# Patient Record
Sex: Female | Born: 1940 | Race: Black or African American | Hispanic: No | Smoking: Former smoker
Health system: Southern US, Community
[De-identification: ages and names within clinical notes are randomized; demographics above are authoritative.]

## PROBLEM LIST (undated history)

## (undated) DIAGNOSIS — I4891 Unspecified atrial fibrillation: Secondary | ICD-10-CM

## (undated) DIAGNOSIS — K5730 Diverticulosis of large intestine without perforation or abscess without bleeding: Secondary | ICD-10-CM

## (undated) DIAGNOSIS — E7800 Pure hypercholesterolemia, unspecified: Secondary | ICD-10-CM

## (undated) DIAGNOSIS — D1720 Benign lipomatous neoplasm of skin and subcutaneous tissue of unspecified limb: Secondary | ICD-10-CM

## (undated) DIAGNOSIS — M1990 Unspecified osteoarthritis, unspecified site: Secondary | ICD-10-CM

## (undated) DIAGNOSIS — I10 Essential (primary) hypertension: Secondary | ICD-10-CM

## (undated) DIAGNOSIS — M359 Systemic involvement of connective tissue, unspecified: Secondary | ICD-10-CM

## (undated) DIAGNOSIS — K648 Other hemorrhoids: Secondary | ICD-10-CM

## (undated) DIAGNOSIS — K219 Gastro-esophageal reflux disease without esophagitis: Secondary | ICD-10-CM

## (undated) HISTORY — DX: Unspecified atrial fibrillation: I48.91

## (undated) HISTORY — PX: VESICOVAGINAL FISTULA CLOSURE W/ TAH: SUR271

## (undated) HISTORY — DX: Benign lipomatous neoplasm of skin and subcutaneous tissue of unspecified limb: D17.20

## (undated) HISTORY — DX: Gastro-esophageal reflux disease without esophagitis: K21.9

## (undated) HISTORY — DX: Pure hypercholesterolemia, unspecified: E78.00

## (undated) HISTORY — PX: VAGINAL HYSTERECTOMY: SUR661

## (undated) HISTORY — PX: THYROIDECTOMY: SHX17

## (undated) HISTORY — DX: Unspecified osteoarthritis, unspecified site: M19.90

## (undated) HISTORY — PX: BREAST BIOPSY: SHX20

## (undated) HISTORY — DX: Other hemorrhoids: K64.8

## (undated) HISTORY — PX: ESOPHAGOGASTRODUODENOSCOPY: SHX1529

## (undated) HISTORY — PX: CYSTECTOMY: SUR359

## (undated) HISTORY — PX: ABDOMINAL HYSTERECTOMY: SHX81

## (undated) HISTORY — DX: Diverticulosis of large intestine without perforation or abscess without bleeding: K57.30

---

## 1999-04-01 ENCOUNTER — Ambulatory Visit (HOSPITAL_COMMUNITY): Admission: RE | Admit: 1999-04-01 | Discharge: 1999-04-01 | Payer: Self-pay | Admitting: Endocrinology

## 1999-04-01 ENCOUNTER — Encounter: Payer: Self-pay | Admitting: Endocrinology

## 1999-05-19 ENCOUNTER — Ambulatory Visit (HOSPITAL_COMMUNITY): Admission: RE | Admit: 1999-05-19 | Discharge: 1999-05-20 | Payer: Self-pay

## 1999-09-27 ENCOUNTER — Other Ambulatory Visit: Admission: RE | Admit: 1999-09-27 | Discharge: 1999-09-27 | Payer: Self-pay | Admitting: Obstetrics and Gynecology

## 2000-09-24 ENCOUNTER — Other Ambulatory Visit: Admission: RE | Admit: 2000-09-24 | Discharge: 2000-09-24 | Payer: Self-pay | Admitting: Obstetrics and Gynecology

## 2000-12-11 ENCOUNTER — Ambulatory Visit (HOSPITAL_COMMUNITY): Admission: RE | Admit: 2000-12-11 | Discharge: 2000-12-11 | Payer: Self-pay | Admitting: Gastroenterology

## 2001-08-02 ENCOUNTER — Inpatient Hospital Stay (HOSPITAL_COMMUNITY): Admission: AD | Admit: 2001-08-02 | Discharge: 2001-08-05 | Payer: Self-pay | Admitting: Family Medicine

## 2001-08-03 ENCOUNTER — Encounter: Payer: Self-pay | Admitting: Family Medicine

## 2001-08-05 ENCOUNTER — Encounter: Payer: Self-pay | Admitting: Nephrology

## 2001-09-02 ENCOUNTER — Encounter: Payer: Self-pay | Admitting: *Deleted

## 2001-09-02 ENCOUNTER — Emergency Department (HOSPITAL_COMMUNITY): Admission: EM | Admit: 2001-09-02 | Discharge: 2001-09-02 | Payer: Self-pay | Admitting: *Deleted

## 2001-12-16 ENCOUNTER — Encounter: Payer: Self-pay | Admitting: Obstetrics and Gynecology

## 2001-12-20 ENCOUNTER — Encounter (INDEPENDENT_AMBULATORY_CARE_PROVIDER_SITE_OTHER): Payer: Self-pay | Admitting: Specialist

## 2001-12-20 ENCOUNTER — Inpatient Hospital Stay (HOSPITAL_COMMUNITY): Admission: RE | Admit: 2001-12-20 | Discharge: 2001-12-22 | Payer: Self-pay | Admitting: Obstetrics and Gynecology

## 2002-07-14 ENCOUNTER — Ambulatory Visit (HOSPITAL_COMMUNITY): Admission: RE | Admit: 2002-07-14 | Discharge: 2002-07-14 | Payer: Self-pay | Admitting: Rheumatology

## 2002-07-14 ENCOUNTER — Encounter: Payer: Self-pay | Admitting: Rheumatology

## 2002-11-10 ENCOUNTER — Encounter (HOSPITAL_COMMUNITY): Admission: RE | Admit: 2002-11-10 | Discharge: 2002-12-10 | Payer: Self-pay | Admitting: Rheumatology

## 2002-11-10 ENCOUNTER — Encounter: Payer: Self-pay | Admitting: Rheumatology

## 2003-01-20 ENCOUNTER — Emergency Department (HOSPITAL_COMMUNITY): Admission: EM | Admit: 2003-01-20 | Discharge: 2003-01-21 | Payer: Self-pay | Admitting: Emergency Medicine

## 2003-07-29 ENCOUNTER — Encounter (HOSPITAL_COMMUNITY): Admission: RE | Admit: 2003-07-29 | Discharge: 2003-08-28 | Payer: Self-pay | Admitting: Orthopedic Surgery

## 2003-09-01 ENCOUNTER — Encounter (HOSPITAL_COMMUNITY): Admission: RE | Admit: 2003-09-01 | Discharge: 2003-09-11 | Payer: Self-pay | Admitting: Orthopedic Surgery

## 2003-09-15 ENCOUNTER — Encounter (HOSPITAL_COMMUNITY): Admission: RE | Admit: 2003-09-15 | Discharge: 2003-10-15 | Payer: Self-pay | Admitting: Orthopedic Surgery

## 2004-10-13 ENCOUNTER — Ambulatory Visit (HOSPITAL_COMMUNITY): Admission: RE | Admit: 2004-10-13 | Discharge: 2004-10-13 | Payer: Self-pay | Admitting: Family Medicine

## 2005-03-09 ENCOUNTER — Ambulatory Visit: Payer: Self-pay | Admitting: Orthopedic Surgery

## 2005-03-13 ENCOUNTER — Ambulatory Visit (HOSPITAL_COMMUNITY): Admission: RE | Admit: 2005-03-13 | Discharge: 2005-03-13 | Payer: Self-pay | Admitting: Orthopedic Surgery

## 2005-03-20 ENCOUNTER — Ambulatory Visit: Payer: Self-pay | Admitting: Orthopedic Surgery

## 2005-09-11 HISTORY — PX: KNEE ARTHROPLASTY: SHX992

## 2005-11-09 ENCOUNTER — Inpatient Hospital Stay (HOSPITAL_COMMUNITY): Admission: RE | Admit: 2005-11-09 | Discharge: 2005-11-14 | Payer: Self-pay | Admitting: Orthopedic Surgery

## 2005-12-04 ENCOUNTER — Encounter (HOSPITAL_COMMUNITY): Admission: RE | Admit: 2005-12-04 | Discharge: 2006-01-03 | Payer: Self-pay | Admitting: Orthopedic Surgery

## 2006-01-05 ENCOUNTER — Encounter (HOSPITAL_COMMUNITY): Admission: RE | Admit: 2006-01-05 | Discharge: 2006-02-04 | Payer: Self-pay | Admitting: Orthopedic Surgery

## 2006-10-04 ENCOUNTER — Inpatient Hospital Stay (HOSPITAL_COMMUNITY): Admission: RE | Admit: 2006-10-04 | Discharge: 2006-10-08 | Payer: Self-pay | Admitting: Orthopedic Surgery

## 2006-10-29 ENCOUNTER — Encounter (HOSPITAL_COMMUNITY): Admission: RE | Admit: 2006-10-29 | Discharge: 2006-11-28 | Payer: Self-pay | Admitting: Orthopedic Surgery

## 2006-11-29 ENCOUNTER — Encounter (HOSPITAL_COMMUNITY): Admission: RE | Admit: 2006-11-29 | Discharge: 2006-12-29 | Payer: Self-pay | Admitting: Orthopedic Surgery

## 2007-09-12 HISTORY — PX: COLONOSCOPY: SHX174

## 2007-09-12 HISTORY — PX: KNEE ARTHROPLASTY: SHX992

## 2007-10-04 ENCOUNTER — Ambulatory Visit: Payer: Self-pay | Admitting: Gastroenterology

## 2007-11-05 ENCOUNTER — Ambulatory Visit: Payer: Self-pay | Admitting: Gastroenterology

## 2007-11-18 ENCOUNTER — Ambulatory Visit (HOSPITAL_COMMUNITY): Admission: AD | Admit: 2007-11-18 | Discharge: 2007-11-18 | Payer: Self-pay | Admitting: Gastroenterology

## 2007-11-18 ENCOUNTER — Ambulatory Visit: Payer: Self-pay | Admitting: Gastroenterology

## 2008-01-12 ENCOUNTER — Emergency Department (HOSPITAL_COMMUNITY): Admission: EM | Admit: 2008-01-12 | Discharge: 2008-01-12 | Payer: Self-pay | Admitting: Emergency Medicine

## 2008-05-06 ENCOUNTER — Ambulatory Visit: Payer: Self-pay | Admitting: Gastroenterology

## 2009-11-11 ENCOUNTER — Ambulatory Visit (HOSPITAL_COMMUNITY): Admission: RE | Admit: 2009-11-11 | Discharge: 2009-11-11 | Payer: Self-pay | Admitting: Ophthalmology

## 2009-12-09 ENCOUNTER — Ambulatory Visit (HOSPITAL_COMMUNITY): Admission: RE | Admit: 2009-12-09 | Discharge: 2009-12-09 | Payer: Self-pay | Admitting: Ophthalmology

## 2010-01-30 ENCOUNTER — Emergency Department (HOSPITAL_COMMUNITY): Admission: EM | Admit: 2010-01-30 | Discharge: 2010-01-30 | Payer: Self-pay | Admitting: Emergency Medicine

## 2010-05-12 HISTORY — PX: OTHER SURGICAL HISTORY: SHX169

## 2010-05-20 ENCOUNTER — Encounter: Admission: RE | Admit: 2010-05-20 | Discharge: 2010-05-20 | Payer: Self-pay | Admitting: General Surgery

## 2010-05-23 ENCOUNTER — Ambulatory Visit
Admission: RE | Admit: 2010-05-23 | Discharge: 2010-05-23 | Payer: Self-pay | Source: Home / Self Care | Admitting: General Surgery

## 2010-08-25 ENCOUNTER — Ambulatory Visit: Payer: Self-pay | Admitting: Gastroenterology

## 2010-08-25 DIAGNOSIS — K648 Other hemorrhoids: Secondary | ICD-10-CM

## 2010-08-25 DIAGNOSIS — K5730 Diverticulosis of large intestine without perforation or abscess without bleeding: Secondary | ICD-10-CM | POA: Insufficient documentation

## 2010-08-25 DIAGNOSIS — K219 Gastro-esophageal reflux disease without esophagitis: Secondary | ICD-10-CM | POA: Insufficient documentation

## 2010-08-25 DIAGNOSIS — E785 Hyperlipidemia, unspecified: Secondary | ICD-10-CM | POA: Insufficient documentation

## 2010-08-25 DIAGNOSIS — I10 Essential (primary) hypertension: Secondary | ICD-10-CM | POA: Insufficient documentation

## 2010-08-25 HISTORY — DX: Diverticulosis of large intestine without perforation or abscess without bleeding: K57.30

## 2010-08-25 HISTORY — DX: Other hemorrhoids: K64.8

## 2010-08-30 ENCOUNTER — Ambulatory Visit: Payer: Self-pay | Admitting: Internal Medicine

## 2010-08-30 DIAGNOSIS — K30 Functional dyspepsia: Secondary | ICD-10-CM | POA: Insufficient documentation

## 2010-08-30 DIAGNOSIS — R1319 Other dysphagia: Secondary | ICD-10-CM | POA: Insufficient documentation

## 2010-09-06 ENCOUNTER — Ambulatory Visit (HOSPITAL_COMMUNITY)
Admission: RE | Admit: 2010-09-06 | Discharge: 2010-09-06 | Payer: Self-pay | Source: Home / Self Care | Attending: Internal Medicine | Admitting: Internal Medicine

## 2010-10-03 NOTE — Op Note (Addendum)
  Melissa Barajas, Melissa Barajas                  ACCOUNT NO.:  [id number]  MEDICAL RECORD NO.:  [id number]          PATIENT TYPE:  AMB  LOCATION:  DAY                           FACILITY:  [facility]  PHYSICIAN:  R. Roetta Sessions, M.D. DATE OF BIRTH:  [birth year]  DATE OF PROCEDURE:  09/06/2010 DATE OF DISCHARGE:                              OPERATIVE REPORT   PROCEDURE:  EGD with [name] dilation.  INDICATIONS FOR PROCEDURE:  A 69-year lady with longstanding reflux symptoms worse more recently on Prevacid 30 mg orally daily, was started on Dexilant 60 mg daily through our office for recent significant preliminary symptoms.  However, she still has some degree of esophageal dysphagia.  EGD with esophageal dilation, etc now being done.  Risks, benefits, limitations, imponderables, and alternatives have been reviewed, all parties questions answered and all parties agreeable. Please see the documentation of medical record.  PROCEDURE NOTE:  O2 saturation, blood pressure, pulse, respirations monitored throughout the entirety of procedure.  CONSCIOUS SEDATION:  Versed 4 mg IV, Demerol 75 mg IV in divided doses.  INSTRUMENT:  Pentax video chip system.  FINDINGS:  Examination of tubular esophagus revealed a noncritical- appearing Schatzki's ring otherwise esophageal mucosa initially appeared entirely normal.  EG junction easily traversed.  Stomach:  Gastric cavity was emptied and insufflated well with air. Thorough examination of gastric mucosa including retroflexion of proximal stomach, esophagogastric junction demonstrated only a small hiatal hernia.  Pylorus was patent, easily traversed.  Examination of the bulb and second portion revealed no abnormalities.  THERAPEUTIC/DIAGNOSTIC MANEUVERS PERFORMED:  Scope was withdrawn.  A 56- French Maloney dilator was passed to full insertion with ease.  A look back revealed ring had been ruptured nicely without apparent complication.  Also questionable  cervical esophageal web also being ruptured with this maneuver.  The patient tolerated the procedure well, was reactive to endoscopy.  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 Otherwise, esophageal mucosa appeared normal, small hiatal hernia, otherwise normal stomach D1 and D2.  RECOMMENDATIONS: 1. Antireflux literature provided to [name]. 2. Continue Dexilant 60 mg orally daily. 3. Follow up appointment with [place] in 1 year.     Jonathon Bellows, M.D.     RMR/MEDQ  D:  09/06/2010  T:  09/06/2010  Job:  [id number]  cc:   Melissa Picket A. Gerda Diss, MD Fax: [phone]  Electronically Signed by Lorrin Goodell M.D. on 10/03/2010 09:11:39 AM

## 2010-10-13 NOTE — Letter (Signed)
Summary: EGD/ED ORDER  EGD/ED ORDER   Imported By: Ave Filter 08/30/2010 16:02:36  _____________________________________________________________________  External Attachment:    Type:   Image     Comment:   External Document

## 2010-10-13 NOTE — Assessment & Plan Note (Signed)
Summary: acid reflux,hoarseness in voice.ss   Visit Type:  Follow-up Visit Primary Care Verena Shawgo:  [name]   CC:  acid reflux and hoarseness.  History of Present Illness: [name] is a 70 year old female with long-standing hx of reflux as well as constipation. She presents today in f/u for reflux. c/o new onset of epigastric pain/indigestion for past few months. +raspiness of voice that is new. Has tried Prilosec in the past but has done well with Prevacid for the past few years; however, over past few months has noticed worsening of symptoms. Denies loss of appetite, rare nausea. Down 7 lbs from 2 yeasr ago but this is purposeful. She c/o new onset of intermittent dysphagia with peanuts, popcorn, and sometimes without warning normal foods. No smoking, no NSAIDs, goodys or bc. Denies alcohol.   Current Medications (verified): 1)  Estroven Maximum Strength  Tabs (Nutritional Supplements) .... Once Daily 2)  Enalapril Maleate 10 Mg Tabs (Enalapril Maleate) 3)  Prevacid 30 Mg Cpdr (Lansoprazole) .... Once Daily 4)  Cardizem Cd 300 Mg Xr24h-Cap (Diltiazem Hcl Coated Beads) .... Once Daily 5)  Pravachol 40 Mg Tabs (Pravastatin Sodium) .... Take 1 Tablet By Mouth Once A Day 6)  Caltrate 600 Mg Plus D .... Two Tablets Daily 7)  Fish Oil 1000 Mg .... Take 1 Tablet By Mouth Two Times A Day  Allergies (verified): No Known Drug Allergies  Past History:  Past Medical History: HTN hypercholesterolemia GERD  Past Surgical History: Hysterectomy (fibroid tumors) Thyroidectomy Left breast cystectomy Left breast "tumor" under nipple in September 2011  Family History: No FH of Colon Cancer: brother had colon polyps age 40 Mother: deceased (age 75, ?CVA?) Father:deceased, cirrhosis (ETOH)  Social History: Retired: [employer] 52 years 4 children Patient is a former smoker. smoked X 15 years, quit in 1990, about 1/2ppd.  Alcohol Use - no, hx of socially in past Smoking  Status:  quit  Review of Systems General:  Denies fever, chills, and anorexia. Eyes:  Denies blurring, irritation, and discharge. ENT:  Complains of hoarseness and difficulty swallowing; denies sore throat. CV:  Denies chest pains and syncope. Resp:  Denies dyspnea at rest and wheezing. GI:  Complains of difficulty swallowing, nausea, indigestion/heartburn, and abdominal pain; denies pain on swallowing, constipation, change in bowel habits, bloody BM's, and black BMs. GU:  Denies urinary burning, blood in urine, and urinary frequency. MS:  Denies joint pain / LOM, joint swelling, and joint stiffness. Derm:  Denies rash, itching, and dry skin. Neuro:  Denies weakness and syncope. Psych:  Denies depression and anxiety. Endo:  Denies cold intolerance and heat intolerance. Heme:  Denies bruising and bleeding.  Vital Signs:  Patient profile:   70 year old female Height:      66 inches Weight:      183 pounds BMI:     29.64 Temp:     98.0 degrees F oral Pulse rate:   60 / minute BP sitting:   142 / 70  (left arm) Cuff size:   large  Vitals Entered By: Cloria Spring LPN (August 30, 2010 9:43 AM)  Physical Exam  General:  Well developed, well nourished, no acute distress. Eyes:  sclera without icterus Mouth:  No deformity or lesions, dentition normal. Lungs:  Clear throughout to auscultation. Heart:  Regular rate and rhythm; no murmurs, rubs,  or bruits. Abdomen:  +BS, soft, mild epigastric tenderness, no masses, no HSM.  Msk:  Symmetrical with no gross deformities. Normal posture. Pulses:  Normal pulses noted. Extremities:  No clubbing, cyanosis, edema or deformities noted. Neurologic:  Alert and  oriented x4;  grossly normal neurologically. Skin:  Intact without significant lesions or rashes. Psych:  Alert and cooperative. Normal mood and affect.  Impression & Recommendations:  Problem # 1:  GERD (ICD-56.45)  70 year old with long-standing hx of GERD previously  controlled on once-daily Prevacid, now with new-onset of worsening reflux, epigastric pain X few months. +raspiness of voice, new. Rare nausea. new onset of intermittent dysphagia. No smoking, NSAIDs, Goody's, or BC powder. due to new onset dyspepsia, will evaluate via EGD. diff dx include gastritis, PUD, erosive esophagitis.   Switch from Prevacid to Dexilant 60 mg by mouth daily. #15 samples given EGD with possible ED with Dr. Jena Gauss: risks, benefits, alternatives discussed with pt in detail. verbal consent obtained.  Orders: Consultation Level III (09811)  Problem # 2:  DYSPHAGIA (BJY-782.95)  See #1.   Orders: Consultation Level III (62130) Prescriptions: DEXILANT 60 MG CPDR (DEXLANSOPRAZOLE) take 1 by mouth daily  #30 x 3   Entered and Authorized by:   Gerrit Halls NP   Signed by:   Gerrit Halls NP on 08/30/2010   Method used:   Faxed to ...       [facility] [address] [phone]* (retail)       [address], [place]  [address]       Ph: [phone]       Fax: [phone]   RxID:   [id number]

## 2010-11-11 ENCOUNTER — Encounter: Payer: Self-pay | Admitting: Urgent Care

## 2010-11-17 ENCOUNTER — Encounter: Payer: Self-pay | Admitting: Gastroenterology

## 2010-11-22 NOTE — Medication Information (Signed)
Summary: DEXILANT 60MG   DEXILANT 60MG    Imported By: Rexene Alberts 11/11/2010 08:54:14  _____________________________________________________________________  External Attachment:    Type:   Image     Comment:   External Document  Appended Document: DEXILANT 60MG    Appended Document: DEXILANT 60MG  dexilant 60mg  sent to [facility] w/ 11 RF

## 2010-11-22 NOTE — Medication Information (Signed)
Summary: DEXILANT 60MG   DEXILANT 60MG    Imported By: Rexene Alberts 11/17/2010 15:06:44  _____________________________________________________________________  External Attachment:    Type:   Image     Comment:   External Document  Appended Document: DEXILANT 60MG  duplicate

## 2010-11-23 ENCOUNTER — Encounter: Payer: Self-pay | Admitting: Gastroenterology

## 2010-11-24 LAB — BASIC METABOLIC PANEL
BUN: 14 mg/dL (ref 6–23)
Chloride: 105 mEq/L (ref 96–112)
Creatinine, Ser: 0.61 mg/dL (ref 0.4–1.2)
GFR calc Af Amer: >60 mL/min (ref >60)
Glucose, Bld: 105 mg/dL — ABNORMAL HIGH (ref 70–99)
Potassium: 4.2 mEq/L (ref 3.5–5.1)
Sodium: 139 mEq/L (ref 135–145)

## 2010-11-24 LAB — POCT HEMOGLOBIN-HEMACUE: Hemoglobin: 13.2 g/dL (ref 12.0–15.0)

## 2010-11-24 LAB — CBC
Hemoglobin: 12.7 g/dL (ref 12.0–15.0)
MCHC: 33.3 g/dL (ref 30.0–36.0)
WBC: 7.7 10*3/uL (ref 4.0–10.5)

## 2010-11-28 LAB — RAPID STREP SCREEN (MED CTR MEBANE ONLY): Streptococcus, Group A Screen (Direct): NEGATIVE

## 2010-11-29 NOTE — Medication Information (Addendum)
Summary: DEXILANT 60MG   DEXILANT 60MG    Imported By: Rexene Alberts 11/23/2010 09:44:04  _____________________________________________________________________  External Attachment:    Type:   Image     Comment:   External Document  Appended Document: DEXILANT 60MG  KJ sent rx on 3/5. Please find out what is wrong.  Appended Document: DEXILANT 60MG  pt is requesting a cheaper medication. It is written in the comment section of the paperwork that was faxed from [facility].   Appended Document: DEXILANT 60MG     Prescriptions: NEXIUM 40 MG CPDR (ESOMEPRAZOLE MAGNESIUM) one by mouth 30 mins before bf daily  #30 x 11   Entered and Authorized by:   Leanna Battles. Dixon Boos   Signed by:   Leanna Battles Isola Mehlman PA-C on 11/28/2010   Method used:   Electronically to        [facility]. [phone]* (retail)       [address] [place], [place]  [address]       Ph: [phone]       Fax: [phone]   RxID:   [id number]     Appended Document: DEXILANT 60MG  Hopefully this will work. The patients formulary is not listed in EMR for whatever reason.

## 2010-11-30 LAB — HEMOGLOBIN AND HEMATOCRIT, BLOOD: HCT: 36.9 % (ref 36.0–46.0)

## 2010-11-30 LAB — BASIC METABOLIC PANEL
Calcium: 9.5 mg/dL (ref 8.4–10.5)
Creatinine, Ser: 0.69 mg/dL (ref 0.4–1.2)
GFR calc Af Amer: >60 mL/min (ref >60)
Potassium: 4.4 mEq/L (ref 3.5–5.1)

## 2010-12-30 ENCOUNTER — Other Ambulatory Visit (HOSPITAL_COMMUNITY): Payer: Self-pay | Admitting: Family Medicine

## 2010-12-30 DIAGNOSIS — M84375A Stress fracture, left foot, initial encounter for fracture: Secondary | ICD-10-CM

## 2010-12-30 DIAGNOSIS — Z139 Encounter for screening, unspecified: Secondary | ICD-10-CM

## 2011-01-04 ENCOUNTER — Ambulatory Visit (HOSPITAL_COMMUNITY)
Admission: RE | Admit: 2011-01-04 | Discharge: 2011-01-04 | Disposition: A | Discharge status: Home / Self Care | Payer: Medicare Other | Source: Ambulatory Visit | Attending: Family Medicine | Admitting: Family Medicine

## 2011-01-04 ENCOUNTER — Encounter (HOSPITAL_COMMUNITY): Payer: Self-pay

## 2011-01-04 DIAGNOSIS — M84375A Stress fracture, left foot, initial encounter for fracture: Secondary | ICD-10-CM

## 2011-01-04 DIAGNOSIS — T148XXA Other injury of unspecified body region, initial encounter: Secondary | ICD-10-CM | POA: Insufficient documentation

## 2011-01-04 DIAGNOSIS — X58XXXA Exposure to other specified factors, initial encounter: Secondary | ICD-10-CM | POA: Insufficient documentation

## 2011-01-04 DIAGNOSIS — Z139 Encounter for screening, unspecified: Secondary | ICD-10-CM

## 2011-01-04 HISTORY — DX: Essential (primary) hypertension: I10

## 2011-01-24 NOTE — Op Note (Signed)
NAMEARIANN, KHAIMOV                  ACCOUNT NO.:  [id number]   MEDICAL RECORD NO.:  [id number]          PATIENT TYPE:  AMB   LOCATION:  DAY                           FACILITY:  [facility]   PHYSICIAN:  Kassie Mends, M.D.      DATE OF BIRTH:  05-11-1941   DATE OF PROCEDURE:  11/18/2007  DATE OF DISCHARGE:                               OPERATIVE REPORT   INDICATION FOR EXAM:  Ms. Roorda is a 70 year old female who presents for  average risk colon cancer screening.   FINDINGS:  1. Rare sigmoid colon diverticula.  Otherwise no polyps, masses,      inflammatory changes or AVMs seen.  2. Small internal hemorrhoids.  Otherwise normal retroflexed view of      the rectum.   RECOMMENDATIONS:  1. Screening colonoscopy in 10 years.  2. She should follow a high fiber diet.  She has been given a handout      on high-fiber diet, diverticulosis and hemorrhoids.   MEDICATIONS:  1. Demerol 50 mg IV.  2. Versed 5 mg IV.   PROCEDURE TECHNIQUE:  Physical exam was performed.  Informed consent was  obtained from the patient after explaining the benefits, risks and  alternatives to the procedure.  The patient was connected to the monitor  and placed in left lateral position.  Continuous oxygen was provided by  nasal cannula and IV medicine administered through an indwelling  cannula.  After administration of sedation and rectal exam, the  patient's rectum was intubated and then the scope was advanced under  direct visualization to the cecum.  The scope was removed slowly by  carefully examining the color, texture, anatomy and integrity of the  mucosa on the way out.  The patient was recovered in endoscopy and  discharged home in satisfactory condition.      Kassie Mends, M.D.  Electronically Signed     SM/MEDQ  D:  11/18/2007  T:  11/19/2007  Job:  [id number]   cc:   Lorin Picket A. Gerda Diss, MD  Fax: [phone]

## 2011-01-24 NOTE — Assessment & Plan Note (Signed)
NAMEMarland Kitchen  ELLIONNA, BUCKBEE                   CHART#:  [id number]   DATE:  10/04/2007                       DOB:  1941/04/29   REFERRING PHYSICIAN:  [name].   REASON FOR CONSULTATION:  Heartburn and abdominal pain.   HISTORY OF PRESENT ILLNESS:  Ms. Fukushima is a 70 year old female who has  had problems with constipation all her life.  She also has  Gastroesophageal Reflux Disease.  She has not been taking her Prevacid  and her symptoms have been uncontrolled.  She occasionally has pain  around her naval.  She thinks she needs to be checked out.  She denies  any problems swallowing.  She thinks that she has a cough at night time  less than once a week due to her uncontrolled reflux disease.  She  denies any nausea, vomiting, weight loss, blood in her stool or black  tarry stools.  Her last colonoscopy was in the 90s with Dr. Randa Evens.  She did not have any polyps.  She quit using BC powder years ago and  denies aspirin or goodie powder use. She uses Aleve less than 1-2 times  a month.  She has never had any problems with sedation. She occasionally  drinks a soda.  She chews 2 sticks of gum a day.  She denies any  bloating.  She has a bowel movements once a week.   PAST MEDICAL HISTORY:  1. Hypertension.  2. Hyperlipidemia.  3. Gastroesophageal Reflux Disease.   PAST SURGICAL HISTORY:  1. Hysterectomy due to fibroid tumors.  2. Thyroidectomy.  3. Left breast cyst removed.   ALLERGIES:  No known drug allergies.   MEDICATIONS:  1. Prevacid 30 mg as needed.  2. Cardiac 300 mg daily.  3. Pravastatin 40 mg daily.  4. Enalapril 20 mg daily.  5. Estroven daily.  6. Estroven q.h.s. as needed.  7. Centrum Silver daily.  8. Caltrate D daily.  9. Gingko biloba daily.  10.Alter hair plus daily.  11.Relic 2 b.i.d. and 2 q.h.s.   FAMILY HISTORY:  She denies any family history of colon cancer, uterine,  breast or ovarian cancer.  Her brother had polyps at age 73.   SOCIAL HISTORY:  She  pays cash for her medicines and then is reimbursed  by her insurance company which is why she has not taken her Prevacid on  a regular basis.  She is married.  She is retired from [employer].  She does not smoke or drink.  She is looking into a membership at [employer].   REVIEW OF SYSTEMS:  Per HPI otherwise all systems are negative.   PHYSICAL EXAMINATION:  VITALS:  Weight 195 pounds, height 5 feet 6-1/2  inches, BMI 31.5 (obese).  Temp 97.8, blood pressure 140/78, pulse 60.  GENERAL:  She is in no apparent distress.  Alert and oriented x4.  HEENT: Atraumatic, normocephalic.  Pupils equal and reactive to light.  Mouth no oral lesions.  Posterior pharynx without erythema or exudate.  LUNGS:  Clear to auscultation bilaterally. CARDIOVASCULAR:  Regular rate  and rhythm with no murmur.  Normal S1/S2.  ABDOMEN:  Bowel sounds are  present, soft.  Nontender, nondistended.  No rebound or guarding.  No  hepatosplenomegaly, obese. EXTREMITIES:  Without cyanosis or edema.  NEURO:  She has no focal  neurologic deficits.   ASSESSMENT:  Ms. Mckinstry is a 70 year old female who has had a longstanding  history of constipation.  She also has gastroesophageal reflux disease  which will likely respond to appropriate therapy.  She has no alarm  symptoms.  Thank you for allowing me to see Ms. Diekmann in consultation.  My recommendations follows.   RECOMMENDATIONS:  1. Ms. Lerner was given her discharge instructions in writing.  For the      management of her constipation she is asked to drink 6-8 cups of      water or juice per day.  She is also asked to follow a high fiber      diet.  She is given a hand out on high fiber diet.  She should add      Benefiber once daily.  2. For her reflux management.  I have given her some samples of      Prilosec and asked her to take Prilosec 30 minutes before her first      meal.  She has also been given a prescription.  She has been given      coupons and she understands that  it is available over-the-counter.  3. She has a return patient visit in one month.  If her symptoms of      Gastroesophageal Reflux Disease are not controlled with Prilosec,      then will consider an endoscopy.  Will also schedule a colonoscopy      after her next visit.       Kassie Mends, M.D.  Electronically Signed    SM/MEDQ  D:  10/04/2007  T:  10/04/2007  Job:  [id number]   cc:   Lorin Picket A. Gerda Diss, MD

## 2011-01-24 NOTE — Assessment & Plan Note (Signed)
NAMEMarland Kitchen  Melissa Barajas, Melissa Barajas                   CHART#:  [id number]   DATE:  05/06/2008                       DOB:  May 20, 1941   REFERRING PHYSICIAN:  Scott A. Gerda Diss, MD   PROBLEM LIST:  1. Mild sigmoid colon diverticulosis.  2. Small internal hemorrhoids.  3. Gastroesophageal reflux disease.  4. Hypertension.  5. Hyperlipidemia.   SUBJECTIVE:  The patient is a 70 year old female who presents as a  return patient visit.  She was last seen in March 2009 for her  colonoscopy.  She reports her bowels are regular and she is not having  any heartburn.   MEDICATIONS:  1. Prilosec 20 mg daily.  2. Benefiber once a day.  3. Relacore.  4. Jeffie Pollock Max daily.  5. Enalapril.  6. Prevacid.  7. Cardizem CD 300 mg daily.   OBJECTIVE:   PHYSICAL EXAMINATION:  VITAL SIGNS:  Weight 190 pounds (down 1 pound  since February 2009), BMI 30.7 (obese), temperature 98.2, blood pressure  126/70, and pulse 60.  GENERAL:  She is in no apparent distress.LUNGS:  Clear to auscultation  bilaterally.CARDIOVASCULAR:  Regular rhythm.ABDOMEN:  Bowel sounds are  present, soft, nontender, and nondistended.   ASSESSMENT:  The patient is a 70 year old female whose gastroesophageal  reflux disease is well controlled.  She has not had any problems with  bowel regularity. Thank you for allowing me to see the patient in  consultation.  My recommendations are as follows.   RECOMMENDATIONS:  1. I encouraged the patient to lose approximately 5-10 pounds.  She is      unable to walk due to arthritis in her right foot.  I did encourage      her to use her stationary bike that is available at      home.  2. She may follow up with me in 6 months.       Kassie Mends, M.D.  Electronically Signed     SM/MEDQ  D:  05/06/2008  T:  05/07/2008  Job:  [id number]   cc:   Lorin Picket A. Gerda Diss, MD

## 2011-01-24 NOTE — Consult Note (Signed)
[name], Melissa Barajas                  ACCOUNT NO.:  [id number]   MEDICAL RECORD NO.:  [id number]           PATIENT TYPE:  AMB   LOCATION:  DAY                           FACILITY:  [facility]   PHYSICIAN:  Kassie Mends, M.D.      DATE OF BIRTH:  30-Aug-1941   DATE OF CONSULTATION:  11/05/2007  DATE OF DISCHARGE:                                 CONSULTATION   REFERRING PHYSICIAN:  Scott A. Gerda Diss, MD   PROBLEM LIST:  1. Hypertension.  2. Hyperlipidemia.  3. Gastroesophageal reflux disease.   SUBJECTIVE:  [name] is a 70 year old female who presents as a return  patient visit.  She restarted her Prevacid.  She is on Prilosec, and she  is no longer waking up coughing or choking.  Her bowel movements are  better with Benefiber.  She denies any problems swallowing.  She does  not smoke for the last 15 years.   MEDICATIONS:  1. Cardizem.  2. Enalapril.  3. Estrogen.  4. Centrum.  5. Caltrate D.  6. Ginkgo Biloba.  7. Ultra-Hair Plus.  8. Relacor.  9. Prilosec 20 mg daily.  10.Benefiber daily.   OBJECTIVE:  VITAL SIGNS:  Weight 191 pounds (down 4 pounds since January  2009), height 5 feet, 6-1/2 inches, temperature 97.9, blood pressure  118/72, pulse 16.  GENERAL:  She is in no apparent distress, alert and  oriented x4.  LUNGS:  Clear to auscultation bilaterally.  CARDIAC:  Regular rhythm, no murmur.  ABDOMEN:  Bowel sounds are  present, soft, nontender, nondistended.   ASSESSMENT:  Melissa Barajas is a 70 year old female who has gastroesophageal  reflux disease and her symptoms are now well controlled on therapy.  She  also needs colon cancer screening.  Thank you for allowing me to see [name] in consultation.  My recommendations follow.   RECOMMENDATIONS:  1. She is given the [facility] lifestyle management recommendations      for people with gastroesophageal reflux      disease.  2. She will be scheduled for colonoscopy with HalfLytely bowel prep.  3. She has a follow-up  appointment to see me in 6 months.  She has no      indication for upper endoscopy.      Kassie Mends, M.D.  Electronically Signed     SM/MEDQ  D:  11/05/2007  T:  11/06/2007  Job:  [id number]   cc:   Lorin Picket A. Gerda Diss, MD  Fax: [phone]

## 2011-01-27 NOTE — Discharge Summary (Signed)
NAMEHEIDEE, AUDI                  ACCOUNT NO.:  [id number]   MEDICAL RECORD NO.:  [id number]          PATIENT TYPE:  INP   LOCATION:  5002                         FACILITY:  [facility]   PHYSICIAN:  Vania Rea. Supple, M.D.  DATE OF BIRTH:  24-Mar-1941   DATE OF ADMISSION:  11/09/2005  DATE OF DISCHARGE:  11/14/2005                                 DISCHARGE SUMMARY   ADMISSION DIAGNOSES:  1.  End-stage osteoarthrosis of right-greater-than-left knee.  2.  Hypertension.  3.  History of rheumatoid arthritis.  4.  Reflux.   DISCHARGE DIAGNOSES:  1.  End-stage osteoarthrosis of right-greater-than-left knee.  2.  Hypertension.  3.  History of rheumatoid arthritis.  4.  Reflux.  5.  Status post right total knee arthroplasty.  6.  Postoperative hemorrhagic anemia requiring transfusion.   BRIEF HISTORY:  [name] is a 70 year old female who has been followed  by [place] as an outpatient for right knee known osteoarthrosis.  She has failed  outpatient conservative measures.  She has now had significant pain and  functional disabilities related to her right total knee arthroplasty.  At  this time, right total knee arthroplasty was indicated and she was in  agreement and wished to proceed.  Risks and benefits were discussed at  length and she wished proceed.   HOSPITAL COURSE:  The patient was admitted and underwent the above-named  procedure and tolerated this well.  All appropriate IV antibiotics and  analgesics were utilized.  Intraoperative Hemovac was placed and this was  discontinued postoperative day one.  She was allowed weightbearing as  tolerated.  She was placed on Coumadin for DVT and PE prophylaxis.  She was  weaned off IV pain meds and overall did well.  Unfortunately, she did  experience postoperative hemorrhagic anemia and became symptomatic.  On date  November 13, 2005, her hemoglobin was 8.3.  Heart rate was noted to be 125.  At  this time, she was transfused two units.  She felt  significantly improved  after that.  On date November 14, 2005, hemoglobin was 11.  Incision and clean  and dry.  T-max was 100 and she was ambulating well, has met all of her  therapy goals.  Her calves were soft and nontender.  At this time, she was  stable for discharge to home.  Home arrangements were made for home health  PT and OT.  She was discharged to home on this date.   LABORATORY DATA:  Admission hemoglobin 12.8, down to 8.1, and transfused to  11.1.  Pro times found in the chart, monitored by pharmacy for DVT and PE  prophylaxis.  Chemistries within normal limits except for mild hyponatremia  at 134 and 133.  Blood type shows 0 positive.  Two units transfused in the  chart.  EKG showed normal sinus rhythm with sinus arrhythmia.   CONDITION ON DISCHARGE:  Stable and improved.   DISCHARGE MEDS AND PLANS:  The patient is being discharged to home.  Home  health, PT and OT are arranged.  She will follow up in  our office in two  weeks' time.  Prescriptions were written for Coumadin, as well as Percocet  and Robaxin.  She also will take iron.  Resume her other home meds and home  diet.  Call for any questions or concerns.  May shower at this time.      Tracy A. Shuford, P.A.-C.      Vania Rea. Supple, M.D.  Electronically Signed    TAS/MEDQ  D:  12/20/2005  T:  12/21/2005  Job:  [id number]

## 2011-01-27 NOTE — Consult Note (Signed)
[facility]  Patient:    Melissa Barajas, Melissa Barajas Visit Number: [id number] MRN: [id number]          Service Type: MED Location: 2A A214 01 Attending Physician:  Harlow Asa Dictated by:   Maudry Diego, M.D. Admit Date:  08/02/2001   CC:         Mel Almond, M.D.   Consultation Report  ATTENDING PHYSICIAN:  Mel Almond, M.D.  REASON FOR CONSULTATION:  Uncontrolled hypertension and proteinuria.  HISTORY OF PRESENT ILLNESS:  Ms. Thome is a 70 year old African-American with a past medical history of hypertension and leg edema presently admitted because of uncontrolled hypertension, a severe headache, and dizziness.  According to Ms. Prisco, she was told to have hypertension about four years ago and was put on medication, but she did not take her medication until she came back recently where she was found to have very severe hypertension and also significant proteinuria, put on Norvasc and also started on ACE inhibitor without significant control.  Hence, patient is admitted.  Patient denies any previous history of diabetes but has recent history of proteinuria, but had had recurrent leg swelling for more than approximately five to seven years.  PAST MEDICAL HISTORY:  She has a history of hysterectomy because of vaginal bleeding, history of retinal surgery because of tumors.  She has a history of thyroid surgery about two years ago, not sure what the etiology was, and status post hemorrhoid surgery a long time ago, history of hypertension, and a history of leg edema.  MEDICATIONS: 1. Vasotec 20 mg p.o. q.d. 2. Norvasc 5 mg p.o. b.i.d. 3. Demerol p.r.n. 4. Rocephin 1 g IV q.24h. 5. Phenergan p.r.n.  ALLERGIES:  She is allergic to ELAVIL and NONSTEROIDAL.  SOCIAL HISTORY:  She is ex-smoker, stopped about five to six years ago.  No history of alcohol abuse.  FAMILY HISTORY:  No history of kidney disease in her family and no history  of diabetes.  REVIEW OF SYSTEMS:  Feels better now.  She has headache, occasional dizziness and nausea but no vomiting, or chest pain, urgency, or frequency, and no diarrhea.  She also had some fever when she came.  PHYSICAL EXAMINATION:  VITAL SIGNS:  Her blood pressure is 167/85, temperature 99.  Heart rate is 88, respiratory rate is 20.  GENERAL:  Alert in no apparent distress.  HEENT:  No conjunctival pallor, nonicteric.  Oral mucosa seems to be moist and pink.  NECK:  Supple.  No JVD.  CHEST:  Clear to auscultation.  No rales, no rhonchi.  HEART:  Regular rate and rhythm.  No murmur.  ABDOMEN:  Soft.  Positive bowel sounds.  EXTREMITIES:  No edema.  LABORATORY DATA:  Her white blood cell count is 10.1, hemoglobin 11.7, hematocrit 33.8, platelets 330 ______ 8.  Sodium 134, potassium 3.4, CO2 37, albumin 2.9, total protein 8.5, calcium 8.7.  ASSESSMENT:  1. History of proteinuria with edema, low albumin.  At this moment, I do not have protein in the urine, hence very difficult to assess. But, if she has significant proteinuria, more than 3.2, with hyponatremia and hypercholesterolemia, this seems to be consistent with nephrotic syndrome. Otherwise, patient might have proteinuria which is not in the nephrotic range. But, according to the history, patient seems to have significant proteinuria. The etiology at this moment is not clear.  But, in a patient who is in her 5s, we need to consider secondary causes such as lupus nephritis.  However,  idiopathic causes such as ______ nephrosclerosis and membranous nephropathy cannot be ruled out.  Since patient also has longstanding history of hypertension, hypertensive nephrosclerosis should be considered high on the list.  2. Hypertension, possibly idiopathic.  But, with the presence of hypokalemia, we may need to think in terms of hyperaldosteronism.  But, since patient has been on diuretics because of her edema, the  hypokalemia may be a a provoked hypokalemia; hence, the hypertension may be simply idiopathy with hypokalemia induced by the diuretics.  3. Hypokalemia.  As stated above, possibly secondary to diuretics.  4. Hypertension.  Patient presently started on Vasotec and Norvasc.  Blood pressure seems to be still somewhat high.  RECOMMENDATION:  Will discontinue Norvasc.  Will start her on Cardizem CD.  I agree with Vasotec.  We will supplement patient with potassium, with ultrasound of the kidneys to evaluate kidney size, and also, we will do a 24-hour urine for proteinuria.  Will check ANA complement, hepatitis B to rule out other cause of proteinuria and renal insufficiency. Dictated by:   Maudry Diego, M.D. Attending Physician:  Harlow Asa DD:  08/03/01 TD:  08/04/01 Job: [id number]

## 2011-01-27 NOTE — Op Note (Signed)
Melissa Barajas, Melissa Barajas                  ACCOUNT NO.:  [id number]   MEDICAL RECORD NO.:  [id number]          PATIENT TYPE:  INP   LOCATION:  2899                         FACILITY:  [facility]   PHYSICIAN:  Vania Rea. Supple, M.D.  DATE OF BIRTH:  06/29/1941   DATE OF PROCEDURE:  11/09/2005  DATE OF DISCHARGE:                                 OPERATIVE REPORT   PREOPERATIVE DIAGNOSIS:  End stage right knee osteoarthrosis.   POSTOPERATIVE DIAGNOSIS:  End stage right knee osteoarthrosis.   PROCEDURE:  Cemented right total knee arthroplasty utilizing a DePuy Sigma  size 3 femur, a size 3 tibia, a 10 mm thick rotating platform polyethylene  insert and 35 mm patellar button.   SURGEON:  Vania Rea. Supple, M.D.   Threasa HeadsFrench Ana A. Shuford, P.A.-C.   ANESTHESIA:  General endotracheal as well as a femoral nerve block.   ESTIMATED BLOOD LOSS:  150 mL.   TOURNIQUET TIME:  1 hour and 20 minutes.   DRAINS:  Hemovac x 1.   HISTORY:  Melissa Barajas is a 70 year old female who has had chronic right knee  pain with progressive valgus deformity and significant functional  limitations secondary to end stage arthrosis with particular involvement of  the lateral compartment.  Due to ongoing pain and functional limitations,  she is brought to the operating at this time for planned right total knee  arthroplasty.   Preoperatively, I counseled Melissa Barajas on treatment options as well as risks  versus benefits thereof.  Possible surgical complications of bleeding,  infection, neurovascular injury, DVT, PE as well as persistent pain were  reviewed.  She understands, accepts, and agrees with our planned procedure.   PROCEDURE IN DETAIL:  After undergoing routine preop evaluation, the patient  received prophylactic antibiotics.  The patient was brought to the operating  room and transferred to table and underwent smooth induction of a general  endotracheal anesthesia.  The anesthesia department then placed a femoral  nerve block.  A Foley catheter was then placed.  A tourniquet was applied to  the left thigh and the left leg was then sterilely prepped and draped in  standard fashion.  The leg was exsanguinated with the tourniquet inflated to  300 mmHg.  An anterior midline incision was then made beginning 4  fingerbreadths above the patella and extending just medial to the tibial  tubercle to a length of approximately 15 cm.  Skin flaps were  circumferentially mobilized and then sutured back.  Electrocautery was used  for hemostasis.  A medial parapatellar arthrotomy was then made with  electrocautery and the patella was everted and the fat pad was excised and a  very limited medial release was performed.  The suprapatellar pouch had  abundant proliferative synovial tissue and extensive synovectomy was  performed.  The patella was everted, the knee flexed up, the remnants of the  cruciate ligaments were removed as were the menisci.  A drill was then used  to gain access to the intramedullary canal and an intramedullary guide was  passed and an 11 mm resection from the  distal femur was made at a 5 degrees  valgus cut.  The distal femur was then sized and appropriate coverage with a  size 3 was determined.  The size 3 three cutting block was applied and the  anterior, posterior, and chamfer cuts were then made with a saw.  Trial  femur was applied showing good fit.  Attention turned to the proximal tibia  which was exposed with the McCale retractors.  An extramedullary guide was  then utilized and initially removed 10 mm of bone from the lateral plateau.  Sizing using the sizing block showed continued tightness and so we elected  to resect two additional mm.  At this point, the sizing block showed a good  fit although there was still some mild residual tightness.  With this in  mind we proceeded with a lateral and posterior release off of the femur in  the posterolateral corner of the tibia and off the  posterior femoral  condyles.  Once this extensive release was completed as well as removal of  all residual menisci posteriorly, the knee showed symmetric soft tissue  balancing in flexion and extension.  The proximal tibia was then drilled and  broached for the tibial keel.  Attention was then returned to the distal  femur where the box cutting guide was applied and a box cut was then made  the oscillating saw.  We then completed the removal of the remnants of the  cruciate ligaments and released the posterior capsule.  Our attention was  then directed towards the patella where synovectomy was performed around the  circumference of the patella and osteophytes were removed rongeur.  An  oscillating saw was then used to make a transverse cut removing the  articular surface and excising approximately 8 mm of bone.  The stabilizing  drill holes were then made with a drill for the 35 mm button.  At this  point, all cut surfaces of the joint was then copiously irrigated with  pulsatile lavage.  The cement was mixed at the back table and at the  appropriate consistency, the implants were cemented into position beginning  with the tibia, then the femur, and then the patella.  Meticulous removal of  all excess cement was then completed.  The knee was taken through a range of  motion showing excellent stability and good soft tissue balance.  The tibial  tray was removed and meticulous removal of all residual debris in the  posterior aspect of the knee was completed.  The final rotating platform  polyethylene tray was then inserted, the knee was reduced and again showed  excellent soft tissue balance, good clinical alignment, and full extension.  The tourniquet was then let down.  A Hemovac drain brought out  superolaterally.  Hemostasis was obtained.  The wound was closed in layers  with a series of interrupted figure-of-eight #1 Vicryl sutures for the parapatellar arthrotomy, 2-0 Vicryl for the  subcu, and intracuticular 3-0  Monocryl for the skin followed by Steri-Strips.  A bulky dry dressing was  wrapped about the knee and leg was wrapped with an Ace bandage with a high  support stocking.  Immobilizer placed.  The patient was then extubated and  taken to recovery room in stable condition.      Vania Rea. Supple, M.D.  Electronically Signed     KMS/MEDQ  D:  11/09/2005  T:  11/09/2005  Job:  [id number]

## 2011-01-27 NOTE — Op Note (Signed)
NAMEHASSIE, MANDT                  ACCOUNT NO.:  [id number]   MEDICAL RECORD NO.:  [id number]          PATIENT TYPE:  INP   LOCATION:  2899                         FACILITY:  [facility]   PHYSICIAN:  Vania Rea. Supple, M.D.  DATE OF BIRTH:  03-Nov-1940   DATE OF PROCEDURE:  10/04/2006  DATE OF DISCHARGE:                               OPERATIVE REPORT   PREOP DIAGNOSIS:  End-stage left knee osteoarthrosis.   POSTOPERATIVE DIAGNOSIS:  End-stage left knee osteoarthrosis.   PROCEDURE:  Cemented left total knee arthroplasty utilizing a DePuy  Sigma System; size 3 femur, size 3 tibia, 10 mm thick rotating platform  polyethylene insert and a 3.5 mm patella button.   SURGEON:  Francena Hanly, M.D.   ASSISTANT:  Ralene Bathe, PA-C   ANESTHESIA:  LMA general as well as a femoral nerve block.   TOURNIQUET TIME:  One hour and 26 minutes.   ESTIMATED BLOOD LOSS:  150 mL.   DRAINS:  Hemovac x1.   BRIEF HISTORY:  Mrs. Vanderloop is a 70 year old female who has had chronic  bilateral knee pain with known end-stage arthrosis and has recently  undergone a right total knee arthroplasty.  She has done very well  clinically and is now brought to the operating for planned left total  knee arthroplasty.   Preoperatively we counseled Mrs. Rials on treatment options as well as  the risks versus benefits thereof with possible surgical complications  of bleeding, infection, neurovascular injury, DVT, PE, persistent pain,  loss of motion, anesthetic complication, and possible need for  additional surgery were all reviewed.  She understands and accepts and  agrees with our planned procedure.   PROCEDURE IN DETAIL:  After undergoing routine preop evaluation, the  patient received prophylactic antibiotics.  Brought to the operating  room and placed supine on the operating table and underwent the smooth  induction of an LMA general anesthesia.  She also had a femoral nerve  block established at this time.  The  tourniquet was then applied to the  left thigh and the left leg was sterilely prepped and draped in standard  fashion.  The leg was exsanguinated with the tourniquet inflated to 350  mmHg.  An anterior midline incision was then made approximately 15 cm in  length and centered over the patella.  Skin flaps were mobilized and  reflected and hemostasis was obtained with electrocautery.   A medial parapatellar arthrotomy was then performed and the patella  everted and approximately 75% of the infrapatellar fat pad was excised.  Remnants of the cruciate ligaments and menisci were removed as well.  A  drill was then used to gain access to the intramedullary canal and left  femur and then an intramedullary guide was passed.  And 11 mm resection  was then performed off the distal femur with 5 degree valgus slope.  The  distal femur was then measured and a size 3 was determined.  The size 3  cutting block was then applied and the anterior, posterior and chamfer  cuts were then made off the distal femur.  The  distal femoral trial was  placed and this showed good bony cuts and excellent fit of the implant.   Attention was then turned to the proximal tibia.  An extramedullary  guide was then used.  We made initially a 10-mm resection measured from  the lateral plateau.  However, once we made this cut and sized the  flexion and extension gaps this was too tight and so we ended up making  two additional resections so that a total of 14 mm was resected from the  proximal tibia.  Once this was completed, we had excellent fit of both  the flexion and extension blocks.  The proximal tibia was then measured  and the size 3 tibia had the best coverage.  The tibia was then pinned  into position.  Trial reduction was performed.  We confirmed good soft  tissue balance with stability in full flexion and full extension.   At this point the proximal tibia was then terminally prepared using the  reamer and then a  broach for the keel.  We then returned our attention  to the distal femur where an oscillating saw was used for the box cut  using the box cutting guide.  The final trial femur box femur was  applied and this showed excellent fit.  Our attention was then turned to  the patella.  An oscillating saw was then used to resect approximately  8.5 mm of bone from the patella and then the stabilizing drill holes  were drilled.  At this point we then confirmed proper resection of the  posterior femoral condyles and cleaned residual meniscal and bony debris  from the posterior aspect of the joint.  All of remnants of the PCL were  removed and a slight posterior capsular release was then performed off  the distal femur.  This allowed excellent soft tissue balance.   At this point the cement was then mixed at the back table and at this  time we were pulsatile lavaging the joint and meticulously cleaning the  joint.  Implants were then cemented in position beginning with the tibia  and then the femur and then the patella.  Meticulous removal of all  excess cement was performed.  Once the cement had hardened we took the  knee through a range of motion and again showed excellent soft tissue  balance and full extension.  A final tibial implant was then placed 10  mm thick rotating platform polyethylene insert.  Once this was placed we  again took the knee through range of motion.  There was normal patellar  tracking.  Good stability.  A Hemovac drain was then brought out  superolaterally.  The tourniquet was let down.  Hemostasis was obtained.  The parapatellar arthrotomy was closed with a series of figure-of-eight  #1 Vicryl sutures.  2-0 Vicryl was used for the closure of the deep and  superficial subcu layers and intracuticular Monocryl used to close the  skin followed by Steri-Strips.  A bulky dry dressing was then placed over the left knee.  A knee immobilizer was applied, ice-packs.  The  patient  was then extubated and taken to the recovery room in stable  condition.      Vania Rea. Supple, M.D.  Electronically Signed     KMS/MEDQ  D:  10/04/2006  T:  10/04/2006  Job:  [id number]

## 2011-01-27 NOTE — Op Note (Signed)
[facility]  Patient:    Melissa Barajas, Melissa Barajas Visit Number: [id number] MRN: [id number]          Service Type: GYN Location: 1S X001 01 Attending Physician:  Brynda Peon Dictated by:   Edwena Felty. [name], M.D. Proc. 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  PROCEDURE: 1. Anterior and posterior repair by Dr. Altamese Dilling. 2. Pubovaginal sling done by Dr. Marcelyn Bruins.  ANESTHESIA:  General endotracheal.  ESTIMATED BLOOD LOSS:  100 cc.  COMPLICATIONS:  None.  DESCRIPTION OF PROCEDURE:  The patient was taken to the operating room and after the induction of adequate general endotracheal anesthesia, was placed in the dorsal lithotomy position and prepped and draped in the usual fashion.  A ______ two way retractor was place.  The vaginal cuff was grasped with Alliss at the base of the cystocele, opened with a knife.  The vaginal mucosa was undermined and opened with Metzenbaum scissors.  The edges of the vaginal mucosa were grasped with Alliss, and the underlying fascia was dissected free off the vaginal mucosa with sharp dissection.  This was done back to the apex of the vaginal cuff.  The fascia was then embrocated with interrupted sutures of 2-0 Vicryl to reduce the cystocele.  Dr. Logan Bores then placed the pubovaginal sling which will be dictated separately.  The vaginal mucosa was then trimmed and reapproximated with a running suture of 2-0 Vicryl.  Attention was next turned posteriorly.  Alliss were placed at the introidus posteriorly.  The vaginal mucosa was opened with the knife and undermined up to the apex of the vagina posteriorly using the Metzenbaum scissors.  The edges of the vaginal mucosa were grasped with Alliss, and the  underlying fascia was separated off the vaginal mucosa with sharp dissection.  The fascia was then embrocated with 2-0 Vicryl; the vaginal mucosa was trimmed, and the vagina was closed in a running fashion with 2-0 Vicryl.  A one-inch vaginal pack soaked in Estrace cream was inserted into the vagina, and the procedure was terminated.  The patient tolerated it well and went in satisfactory condition to postanesthesia recovery. Dictated by:   Edwena Felty. [name], M.D. Attending Physician:  Brynda Peon DD:  12/20/01 TD:  12/21/01 Job: [id number]

## 2011-01-27 NOTE — Discharge Summary (Signed)
[facility]  Patient:    Melissa Barajas, Melissa Barajas Visit Number: [id number] MRN: [id number]          Service Type: EMS Location: ED Attending Physician:  Ilean Skill Dictated by:   Donna Bernard, M.D. Admit Date:  09/02/2001 Discharge Date: 09/02/2001                             Discharge Summary  FINAL DIAGNOSES: 1. Accelerated hypertension. 2. Proteinuria. 3. Headache secondary to #1.  FINAL DISPOSITION:  The patient was discharged home.  DISCHARGE MEDICATIONS: 1. Vasotec 20 mg 1  per day. 2. Cardizem CD 240 mg 1 per day. 3. Maintain lipitor.  DISCHARGE INSTRUCTIONS: 1. Finish all antibiotics. 2. Follow up with Dr. [name] next week. 3. Follow up with Dr. Kristian Covey as scheduled in December.  INITIAL HISTORY AND PHYSICAL:  Please see history and physical as dictated.  HOSPITAL COURSE:  This patient is a 70 year old black female with a history of a history of hyperlipidemia who presented to the office for the third time in 3 days with the complaints of severe headache.  She is known to have extremely high blood pressure with a blood pressure 210/118.  The patient appeared to be in a moderate level of distress.  A urinalysis during the weekend revealed proteinuria.  We had already set the patient up with a kidney specialist.  Due to her progression of symptoms she was admitted to the hospital.  CT scan was done and this showed no acute hemorrhage.  A 24-hour urine collection was performed.  This revealed a significant amount of albumin loss but not enough to designate as true nephrotic syndrome.  The proteinuria was felt to be due to her renal disease.  Over the next several days the patient clinically improved.  Of note, she was given Rocephin IV for the possibility of sinusitis as part of her symptoms. Her urinalysis continued to show significant amount of protein.  Her renal function appeared to be normal with a creatinine of 0.7.  The  patient had an ultrasound which revealed no evidence of hydronephrosis and a normal sized urinary tract including the kidneys.  Her EKG was monitored due to the history of prior atrial fibrillation.  This remained normal.  Over the next several days the patient improved.  Her headache dissipated.  Her blood pressure improved markedly on her medications.  On the day of discharge the patient was discharged home with the diagnoses and disposition as noted above. Dictated by:   Donna Bernard, M.D. Attending Physician:  Ilean Skill DD:  09/05/01 TD:  09/06/01 Job: [id number]

## 2011-01-27 NOTE — Op Note (Signed)
[facility]  Patient:    Melissa Barajas, Melissa Barajas Visit Number: [id number] MRN: [id number]          Service Type: GYN Location: 1S X001 01 Attending Physician:  Brynda Peon Dictated by:   Jamison Neighbor, M.D. Proc. Date: 12/20/01 Admit Date:  12/20/2001   CC:         [name], M.D.   Operative Report  PREOPERATIVE DIAGNOSES: 1. Pelvic relaxation. 2. Stress urinary incontinence.  POSTOPERATIVE DIAGNOSES: 1. Pelvic relaxation. 2. Stress urinary incontinence.  PROCEDURE: 1. Anterior and posterior colporrhaphy, by Dr. [name]. 2. Cystoscopy with suprapubic tube and pubovaginal sling -- Dr. Logan Bores.   SURGEONS: 1. [name], M.D. 2. Jamison Neighbor, M.D.  ANESTHESIA:  General.  COMPLICATIONS:  None.  DRAINS:  Suprapubic catheter.  BRIEF HISTORY:  A 70 year old female who has symptomatic pelvic relaxation with a large anterior and posterior defect.  There is concern that if she has the cystocele repair without concomitant support of the bladder neck she will have problems with stress incontinence.  The patient has been evaluated; it does appear that there is significant _____________  mobility, placing her at risk for postoperative incontinence.  The patient has agreed to undergo simultaneous pubovaginal sling, along with the anterior and posterior repair. She understands the risks and benefits of the procedure, including the possible over or under-correction; which may require additional therapy.  She also understands that she could have some degree of postoperative urgency incontinence that may require medication and/or operative revision.  She gave a full and informed consent for the procedure.  DESCRIPTION OF PROCEDURE:  After the successful induction of general endotracheal anesthesia, the patient was placed in a dorsal lithotomy position; prepped with Betadine and draped in the usual sterile fashion.   An anterior vaginal wall incision was made by Dr. Altamese Dilling and performed a standard anterior colporrhaphy.  Prior to her performing the mattress plication stitches, the endopelvic fascia was opened on each side by me in preparation for the pubovaginal sling.  Once the anterior repair had been completed, the sling was constructed on the back table from a piece of _____________ 4 cm in width x 7 cm in length.  This was cut into a "T" shape and the ends were resewn with #1 nylon sutures.  A small transverse incision was made directly above the pubic bone, and carried down sharply until the rectus sheath and pubic bone were identified.  A tonsillar clamp was used to pull the sling from the vaginal hood up to the abdominal incision.  This was secured in place with several tacking sutures of 2-0 Vicryl, so that it spanned from the major urethral complex all the way down to the cardinal ligaments.  This bottom "T" of the sling nicely covered over the anterior repair.  The sling was brought up with what was thought to be an appropriate degree of tension, and it was tied down to two fingerbreadths so it could still be placed underneath the nylon sutures.  Cystoscopy demonstrated there was coaptation, but no angulation of the urethra, with a full bladder.  The patient did not leak, but with a Crede maneuver there was a straight prompt flow or urine. There was no signs of any injury to the bladder from the procedure.  The cystoscope still had 30-45 degrees of play, with no excessive angulation.  Under direct vision a cystocath was inserted; this was placed to straight drainage and sutured in place with  nylon sutures.  Final inspection of the bladder showed that the ureteral orifices were normal.  There was no mucosal lesion.  The bladder was otherwise quite unremarkable.  The incision was then closed with 2-0 Vicryl and staples.  The _____________ was sewn down with 3-0 nylon sutures.  The  patient had a dressing applied.  The patient then underwent a posterior repair, which will be dictated by Dr. [name]. Dictated by:   Jamison Neighbor, M.D. Attending Physician:  Brynda Peon DD:  12/20/01 TD:  12/21/01 Job: [id number]

## 2011-01-27 NOTE — Discharge Summary (Signed)
Melissa, Barajas                  ACCOUNT NO.:  [id number]   MEDICAL RECORD NO.:  [id number]          PATIENT TYPE:  INP   LOCATION:  5032                         FACILITY:  [facility]   PHYSICIAN:  Lucita Lora. Shuford, P.A.-C.DATE OF BIRTH:  [birth year]   DATE OF ADMISSION:  10/04/2006  DATE OF DISCHARGE:  10/08/2006                               DISCHARGE SUMMARY   ADMISSION DIAGNOSES:  1. Left knee osteoarthrosis.  2. Hypertension.  3. Hypercholesteremia.   DISCHARGE DIAGNOSES:  1. Left knee osteoarthrosis.  2. Hypertension.  3. Hypercholesteremia.  4. Status post left total knee arthroplasty.   OPERATIONS:  Left total knee arthroplasty.   SURGEON:  Francena Hanly, M.D.   ASSISTANT:  Ralene Bathe, [facility]   ANESTHESIA:  General anesthetic with femoral nerve block.   BRIEF HISTORY:  Melissa Barajas is a pleasant 70 year old female, who is well-  known to [place], having previously undergone right total knee arthroplasty  and has done extremely well.  She has had progressive pain and deformity  in regards to her left knee, known osteoarthrosis.  She has failed all  conservative measures and is now ready for total knee replacement.  Risks and benefits discussed with patient and she is in agreement and  wishes to proceed.   HOSPITAL COURSE:  Patient is admitted, underwent the above named  procedure and tolerated this well.  All appropriate IV antibiotics and  analgesics were utilized.  She was placed on Coumadin for 3 weeks  postoperatively for DVT and prophylaxis was PA stockings were utilized.  She did do well postoperatively.  Unfortunately, she did have a  postoperative hemorrhagic anemia with the hemoglobin dropping to 8.7.  She was transfused 2 units.  She did well with this and day by October 08, 2006, she had met all therapy goals.  She was afebrile, vital signs  were stable.  A hemoglobin was stable at 11.0.  At this time, she was  medically and orthopedically stable for discharge to  home.   LABORATORY DATA:  At admission, hemoglobin 12.3.  She dropped to 8.7.  after transfusions, stable at 11.  Multiple chemistries found in the  chart, all within normal limits, other than mild hyponatremia at 131,  ProTime INRs monitored by pharmacy.  2 units found transfused in the  chart, Type O positive.  Chest x-ray, EKG are in old chart.   CONDITION ON DISCHARGE:  Stable, improved.   DISCHARGE MEDS AND PLANS:  1. Patient is being discharged to home in the care of her family.      _______ will follow her for home health PTO.  2. Prescriptions for Percocet, Robaxin, Ambien, and Coumadin were      given.  Resume other home medications and home diet.   CONDITION ON DISCHARGE:  Stable, improved.   Follow up in our office in 2 weeks, call for a time.      Tracy A. Shuford, P.A.-C.     TAS/MEDQ  D:  12/13/2006  T:  12/13/2006  Job:  [id number]

## 2011-03-07 ENCOUNTER — Emergency Department (HOSPITAL_COMMUNITY)
Admission: EM | Admit: 2011-03-07 | Discharge: 2011-03-08 | Disposition: A | Discharge status: Home / Self Care | Payer: Medicare Other | Attending: Emergency Medicine | Admitting: Emergency Medicine

## 2011-03-07 DIAGNOSIS — T41205A Adverse effect of unspecified general anesthetics, initial encounter: Secondary | ICD-10-CM | POA: Insufficient documentation

## 2011-03-07 DIAGNOSIS — E7800 Pure hypercholesterolemia, unspecified: Secondary | ICD-10-CM | POA: Insufficient documentation

## 2011-03-07 DIAGNOSIS — I499 Cardiac arrhythmia, unspecified: Secondary | ICD-10-CM | POA: Insufficient documentation

## 2011-03-07 DIAGNOSIS — Y9229 Other specified public building as the place of occurrence of the external cause: Secondary | ICD-10-CM | POA: Insufficient documentation

## 2011-03-07 DIAGNOSIS — I10 Essential (primary) hypertension: Secondary | ICD-10-CM | POA: Insufficient documentation

## 2011-03-07 DIAGNOSIS — K219 Gastro-esophageal reflux disease without esophagitis: Secondary | ICD-10-CM | POA: Insufficient documentation

## 2011-03-07 DIAGNOSIS — T783XXA Angioneurotic edema, initial encounter: Secondary | ICD-10-CM | POA: Insufficient documentation

## 2011-04-20 ENCOUNTER — Encounter (INDEPENDENT_AMBULATORY_CARE_PROVIDER_SITE_OTHER): Payer: Self-pay | Admitting: General Surgery

## 2011-06-28 ENCOUNTER — Encounter: Payer: Self-pay | Admitting: Internal Medicine

## 2011-09-08 ENCOUNTER — Emergency Department (HOSPITAL_COMMUNITY)
Admission: EM | Admit: 2011-09-08 | Discharge: 2011-09-08 | Disposition: A | Discharge status: Home / Self Care | Payer: Medicare Other | Attending: Emergency Medicine | Admitting: Emergency Medicine

## 2011-09-08 ENCOUNTER — Emergency Department (HOSPITAL_COMMUNITY): Payer: Medicare Other

## 2011-09-08 ENCOUNTER — Encounter (HOSPITAL_COMMUNITY): Payer: Self-pay

## 2011-09-08 DIAGNOSIS — E7800 Pure hypercholesterolemia, unspecified: Secondary | ICD-10-CM | POA: Insufficient documentation

## 2011-09-08 DIAGNOSIS — I10 Essential (primary) hypertension: Secondary | ICD-10-CM | POA: Insufficient documentation

## 2011-09-08 DIAGNOSIS — W108XXA Fall (on) (from) other stairs and steps, initial encounter: Secondary | ICD-10-CM | POA: Insufficient documentation

## 2011-09-08 DIAGNOSIS — M25559 Pain in unspecified hip: Secondary | ICD-10-CM | POA: Insufficient documentation

## 2011-09-08 DIAGNOSIS — S060X0A Concussion without loss of consciousness, initial encounter: Secondary | ICD-10-CM | POA: Insufficient documentation

## 2011-09-08 DIAGNOSIS — R112 Nausea with vomiting, unspecified: Secondary | ICD-10-CM | POA: Insufficient documentation

## 2011-09-08 DIAGNOSIS — R51 Headache: Secondary | ICD-10-CM | POA: Insufficient documentation

## 2011-09-08 DIAGNOSIS — M542 Cervicalgia: Secondary | ICD-10-CM | POA: Insufficient documentation

## 2011-09-08 DIAGNOSIS — K219 Gastro-esophageal reflux disease without esophagitis: Secondary | ICD-10-CM | POA: Insufficient documentation

## 2011-09-08 MED ORDER — ONDANSETRON 8 MG PO TBDP: 8.0000 mg | ORAL_TABLET | Freq: Three times a day (TID) | ORAL | Status: AC | PRN

## 2011-09-08 MED ORDER — ONDANSETRON 8 MG PO TBDP
8.0000 mg | ORAL_TABLET | Freq: Once | ORAL | Status: AC
  Administered 2011-09-08: 8 mg via ORAL

## 2011-09-08 MED ORDER — ONDANSETRON 4 MG PO TBDP
4.0000 mg | ORAL_TABLET | Freq: Once | ORAL | Status: AC
  Administered 2011-09-08: 4 mg via ORAL
  Filled 2011-09-08: qty 1

## 2011-09-08 MED ORDER — IBUPROFEN 800 MG PO TABS
800.0000 mg | ORAL_TABLET | Freq: Once | ORAL | Status: AC
  Administered 2011-09-08: 800 mg via ORAL
  Filled 2011-09-08: qty 1

## 2011-09-08 NOTE — ED Provider Notes (Signed)
History   This chart was scribed for Joya Gaskins, MD by Charolett Bumpers . The patient was seen in room APA12/APA12 and the patient's care was started at 4:04pm.    CSN: [id number]  Arrival date & time 09/08/11  1548   First MD Initiated Contact with Patient 09/08/11 1553      Chief Complaint  Patient presents with  . Fall     HPI Melissa Barajas is a 70 y.o. female who presents to the Emergency Department complaining of constant, moderate neck and right hip pain associated with a fall that occurred prior to ED arrival. Patient stated she fell down about 10 steps and hit her head. She has associated symptoms of vomiting and nausea. She denies syncope when she fell, weakness in extremities, chest pain, abdominal pain, flank pain, and SOB. She also states she in not on any anticoagulants. Worsened by - movement Improved by - rest    Past Medical History  Diagnosis Date  . Hypertension   . GERD (gastroesophageal reflux disease)   . Hypercholesteremia     Past Surgical History  Procedure Date  . 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 Thyroidectomy   . Cystectomy     Left breast  . 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 No pertinent family history.  History  Substance Use Topics  . Smoking status: Not on file  . Smokeless tobacco: Not on file  . Alcohol Use: Not on file    OB History    Grav Para Term Preterm Abortions TAB SAB Ect Mult Living                  Review of Systems A complete 10 system review of systems was obtained and is otherwise negative except as noted in the HPI and PMH.   Allergies  Review of patient's allergies indicates no known allergies.  Home Medications   Current Outpatient Rx  Name Route Sig Dispense Refill  . CALTRATE 600+D PO Oral Take by mouth.      Marland Kitchen DILTIAZEM HCL ER COATED BEADS 300 MG PO CP24 Oral Take 300 mg by mouth daily.      . ENALAPRIL MALEATE 10 MG PO TABS Oral  Take 10 mg by mouth daily.      Marland Kitchen ESOMEPRAZOLE MAGNESIUM 40 MG PO CPDR Oral Take 40 mg by mouth daily before breakfast.      . OMEGA-3 FATTY ACIDS 1000 MG PO CAPS Oral Take 2 g by mouth daily.      Marland Kitchen LANSOPRAZOLE 30 MG PO CPDR Oral Take 30 mg by mouth daily.      Marland Kitchen PRAVASTATIN SODIUM 40 MG PO TABS Oral Take 40 mg by mouth daily.        BP 156/78  Pulse 72  Temp(Src) 98.4 F (36.9 C) (Oral)  Resp 18  Ht 5\' 6"  (1.676 m)  SpO2 96%  Physical Exam CONSTITUTIONAL: Well developed/well nourished HEAD AND FACE: Normocephalic/atraumatic EYES: EOMI/PERRL ENMT: Mucous membranes moist, no facial/nasal trauma NECK: supple no meningeal signs SPINE:entire spine nontender CV: S1/S2 noted, no murmurs/rubs/gallops noted LUNGS: Lungs are clear to auscultation bilaterally, no apparent distress ABDOMEN: soft, nontender, no rebound or guarding GU:no cva tenderness NEURO: Pt is alert, moves all extremitiesx4, GCS 14 EXTREMITIES: pulses normal, tenderness of Right hip, full ROM in other extremites SKIN: warm, color normal PSYCH: no abnormalities of mood noted  ED Course  Procedures  COORDINATION OF CARE:     Labs Reviewed - No data to display Dg Hip Complete Right  09/08/2011  *RADIOLOGY REPORT*  Clinical Data: Post fall, now with right-sided hip pain  RIGHT HIP - COMPLETE 2+ VIEW  Comparison: None.  Findings: No fracture or dislocation. Right hip joint spaces are preserved.  Limited visualization of the pelvis is unremarkable. Regional soft tissues are normal.  IMPRESSION: No fracture.  Original Report Authenticated By: Waynard Reeds, M.D.   Ct Head Wo Contrast  09/08/2011  *RADIOLOGY REPORT*  Clinical Data:  Fall with trauma to the head and neck.  Pain and vomiting.  CT HEAD WITHOUT CONTRAST CT CERVICAL SPINE WITHOUT CONTRAST  Technique:  Multidetector CT imaging of the head and cervical spine was performed following the standard protocol without intravenous contrast.  Multiplanar CT image  reconstructions of the cervical spine were also generated.  Comparison:  None  CT HEAD  Findings: No sign of skull fracture or fluid in the sinuses.  No intracranial hemorrhage.  The patient has areas of abnormal low density within the deep white matter and basal ganglia region consistent with old small vessel infarctions.  One could not exclude the possibility of a recent infarction.  No large vessel territory infarction.  No mass lesion, hydrocephalus or extra-axial collection.  The patient has a tendency towards extensive dural calcification.  IMPRESSION: No traumatic finding.  Areas of low density in the hemispheric deep white matter and basal ganglia most consistent with chronic small vessel disease.  One could not exclude an acute infarction admixed with these chronic changes.  CT CERVICAL SPINE  Findings: No fracture or traumatic malalignment.  There is ordinary degenerative change between the anterior arch of C1 and the dens. There is chronic facet effusion on the left at C2-3.  There is chronic degenerative spondylosis from C3-4 through C6-7.  There is facet arthropathy on the left at C3-4 and C4-5 and on the right at C3-4, C4-5 and C5-6.  There is no discernible neck mass.  The patient has had partial thyroidectomy on the right.  The thyroid has a markedly heterogeneous appearance with what appear to be multiple small nodules. Depending on the patient's history, thyroid ultrasound might be considered as an outpatient to evaluate the thyroid more completely.  IMPRESSION: No acute finding.  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  Multiple small thyroid nodules in the setting of previous partial right thyroidectomy.  Original Report Authenticated By: Thomasenia Sales, M.D.   Ct Cervical Spine Wo Contrast  09/08/2011  *RADIOLOGY REPORT*  Clinical Data:  Fall with trauma to the head and neck.  Pain and vomiting.  CT HEAD WITHOUT CONTRAST CT CERVICAL SPINE WITHOUT CONTRAST  Technique:   Multidetector CT imaging of the head and cervical spine was performed following the standard protocol without intravenous contrast.  Multiplanar CT image reconstructions of the cervical spine were also generated.  Comparison:  None  CT HEAD  Findings: No sign of skull fracture or fluid in the sinuses.  No intracranial hemorrhage.  The patient has areas of abnormal low density within the deep white matter and basal ganglia region consistent with old small vessel infarctions.  One could not exclude the possibility of a recent infarction.  No large vessel territory infarction.  No mass lesion, hydrocephalus or extra-axial collection.  The patient has a tendency towards extensive dural calcification.  IMPRESSION: No traumatic finding.  Areas of low density in the  hemispheric deep white matter and basal ganglia most consistent with chronic small vessel disease.  One could not exclude an acute infarction admixed with these chronic changes.  CT CERVICAL SPINE  Findings: No fracture or traumatic malalignment.  There is ordinary degenerative change between the anterior arch of C1 and the dens. There is chronic facet effusion on the left at C2-3.  There is chronic degenerative spondylosis from C3-4 through C6-7.  There is facet arthropathy on the left at C3-4 and C4-5 and on the right at C3-4, C4-5 and C5-6.  There is no discernible neck mass.  The patient has had partial thyroidectomy on the right.  The thyroid has a markedly heterogeneous appearance with what appear to be multiple small nodules. Depending on the patient's history, thyroid ultrasound might be considered as an outpatient to evaluate the thyroid more completely.  IMPRESSION: No acute finding.  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  Multiple small thyroid nodules in the setting of previous partial right thyroidectomy.  Original Report Authenticated By: Thomasenia Sales, M.D.     Pt improved GCS 15, ambulatory, no distress, no other  complaints Stable for d/c BP 156/78  Pulse 72  Temp(Src) 98.4 F (36.9 C) (Oral)  Resp 18  Ht 5\' 6"  (1.676 m)  SpO2 96% She has family that can stay with her tonight   MDM  Nursing notes reviewed and considered in documentation xrays reviewed and considered    I personally performed the services described in this documentation, which was scribed in my presence. The recorded information has been reviewed and considered.         Joya Gaskins, MD 09/08/11 [phone]

## 2011-09-08 NOTE — ED Notes (Signed)
Pt ambulatory to restroom, tolerated well, pt states that she is feeling better, family at beside,

## 2011-09-08 NOTE — ED Notes (Signed)
Pt tolerating po fluids

## 2011-09-08 NOTE — ED Notes (Signed)
Pt states that she attempting to come down some steps when she reached to get her cat and pt fell down the steps, unsure of how many steps she fell down, pt admits to hitting her head, pt has hematoma to top of head, and pain to right hip area. Pt denies any loc, does state that she became dizzy after the fall, pt arrived in er fully immobilized, actively vomiting, V.O obtained from Dr. Colon Branch to remove pt from lsb and medicate for nausea, pt removed from lsb, c-spine remains intact. Cms intact all extremities pre and post removal of lsb, pt given zofran 8mg  odt with improvement in n/v,

## 2011-09-08 NOTE — ED Notes (Signed)
Per EMS, pt bent over to pick up her cat and fell down steps. EMS  States pt has a hematoma to top of head, under her wig. Pt vomiting on arrival to ED

## 2011-09-11 ENCOUNTER — Emergency Department (HOSPITAL_COMMUNITY)
Admission: EM | Admit: 2011-09-11 | Discharge: 2011-09-11 | Disposition: A | Discharge status: Home / Self Care | Payer: Medicare Other | Attending: Emergency Medicine | Admitting: Emergency Medicine

## 2011-09-11 ENCOUNTER — Encounter (HOSPITAL_COMMUNITY): Payer: Self-pay | Admitting: *Deleted

## 2011-09-11 DIAGNOSIS — M546 Pain in thoracic spine: Secondary | ICD-10-CM | POA: Insufficient documentation

## 2011-09-11 DIAGNOSIS — W19XXXA Unspecified fall, initial encounter: Secondary | ICD-10-CM

## 2011-09-11 DIAGNOSIS — R51 Headache: Secondary | ICD-10-CM | POA: Insufficient documentation

## 2011-09-11 DIAGNOSIS — K219 Gastro-esophageal reflux disease without esophagitis: Secondary | ICD-10-CM | POA: Insufficient documentation

## 2011-09-11 DIAGNOSIS — M25519 Pain in unspecified shoulder: Secondary | ICD-10-CM | POA: Insufficient documentation

## 2011-09-11 DIAGNOSIS — E7800 Pure hypercholesterolemia, unspecified: Secondary | ICD-10-CM | POA: Insufficient documentation

## 2011-09-11 DIAGNOSIS — M542 Cervicalgia: Secondary | ICD-10-CM

## 2011-09-11 DIAGNOSIS — Z79899 Other long term (current) drug therapy: Secondary | ICD-10-CM | POA: Insufficient documentation

## 2011-09-11 DIAGNOSIS — W108XXA Fall (on) (from) other stairs and steps, initial encounter: Secondary | ICD-10-CM | POA: Insufficient documentation

## 2011-09-11 DIAGNOSIS — M545 Low back pain, unspecified: Secondary | ICD-10-CM | POA: Insufficient documentation

## 2011-09-11 DIAGNOSIS — I10 Essential (primary) hypertension: Secondary | ICD-10-CM | POA: Insufficient documentation

## 2011-09-11 MED ORDER — OXYCODONE-ACETAMINOPHEN 5-325 MG PO TABS: 2.0000 | ORAL_TABLET | ORAL | Status: AC | PRN

## 2011-09-11 MED ORDER — HYDROMORPHONE HCL PF 2 MG/ML IJ SOLN
2.0000 mg | Freq: Once | INTRAMUSCULAR | Status: AC
  Administered 2011-09-11: 2 mg via INTRAMUSCULAR
  Filled 2011-09-11: qty 1

## 2011-09-11 NOTE — ED Notes (Signed)
Pt states that she fell down her basement steps on Friday while reaching for her cat. Pt c/o pain in her head,  Right side of her neck radiating down to her right shoulder, lower back and right hip. Pt states that her pain has increased and she did not receive any pain medicine to take home on Friday. Pt has large purple bruise to right armpit and back of her arm.

## 2011-09-11 NOTE — ED Provider Notes (Signed)
Scribed for Melissa Stairs, MD, the patient was seen in room APA14/APA14 . This chart was scribed by Ellie Lunch.   CSN: [id number]  Arrival date & time 09/11/11  0911   First MD Initiated Contact with Patient 09/11/11 [phone]      Chief Complaint  Patient presents with  . Fall    (Consider location/radiation/quality/duration/timing/severity/associated sxs/prior treatment) The history is provided by the patient. No language interpreter was used.   Melissa Barajas is a 70 y.o. female who presents to the Emergency Department complaining of fall.  Pt fell down 12 Barajas 09/08/2011 was seen in ED. Pt says she was not treated or discharged with any pain medication. Pt c/o of head, posterior neck, right shoulder, and lower back pain. Pain has been constant since onset and is currently rated 9/10 in severity. Pain is worse with movement. Pt denies any new injury.    Past Medical History  Diagnosis Date  . Hypertension   . GERD (gastroesophageal reflux disease)   . Hypercholesteremia     Past Surgical History  Procedure Date  . 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 Thyroidectomy   . Cystectomy     Left breast  . 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 No pertinent family history.  History  Substance Use Topics  . Smoking status: Never Smoker   . Smokeless tobacco: Not on file  . Alcohol Use: No    Review of Systems 10 Systems reviewed and are negative for acute change except as noted in the HPI.  Allergies  Review of patient's allergies indicates no known allergies.  Home Medications   Current Outpatient Rx  Name Route Sig Dispense Refill  . CALTRATE 600+D PO Oral Take 1 tablet by mouth daily.     Marland Kitchen DICLOFENAC-MISOPROSTOL 75-200 MG-MCG PO TABS Oral Take 1 tablet by mouth daily.      Marland Kitchen DILTIAZEM HCL ER COATED BEADS 300 MG PO CP24 Oral Take 300 mg by mouth daily.      . OMEGA-3 FATTY ACIDS 1000 MG PO CAPS Oral Take 1-2 g  by mouth daily.     Marland Kitchen LANSOPRAZOLE 30 MG PO CPDR Oral Take 30 mg by mouth at bedtime.     Marland Kitchen ONDANSETRON 8 MG PO TBDP Oral Take 1 tablet (8 mg total) by mouth every 8 (eight) hours as needed for nausea. 20 tablet 0  . PRAVASTATIN SODIUM 40 MG PO TABS Oral Take 40 mg by mouth daily.       BP 180/85  Pulse 80  Temp(Src) 98.4 F (36.9 C) (Oral)  Resp 18  Ht 5' 6.5" (1.689 m)  Wt 180 lb (81.647 kg)  BMI 28.62 kg/m2  SpO2 96%  Physical Exam  Nursing note and vitals reviewed. Constitutional: She is oriented to person, place, and time. She appears well-developed and well-nourished. No distress.  HENT:  Head: Normocephalic and atraumatic.  Eyes: Conjunctivae are normal. No scleral icterus.  Cardiovascular: Normal rate, regular rhythm and normal heart sounds.   Pulmonary/Chest: Effort normal and breath sounds normal. No respiratory distress.  Abdominal: Soft. There is no tenderness.  Musculoskeletal:       Lateral aspect of neck. No c spine tenderness. Tender thoracic spine.   Neurological: She is alert and oriented to person, place, and time.  Skin: Skin is warm and dry.    ED Course  Procedures (including critical care time) DIAGNOSTIC STUDIES:  Oxygen Saturation is 96% on room air , normal by my interpretation.    COORDINATION OF CARE:  09:53 EDP discussed with PT xray of hip and CT spine and head on 09/08/2011 were unremarkable. Pt denies any new or recent injury. No plan to re-image. Plan to treat pain.   ED MEDICATIONS Medications  HYDROmorphone (DILAUDID) injection 2 mg (2 mg Intramuscular Given 09/11/11 1014)   11:05 AM Pt recheck. Pt feels improved and is ready for discharge.   No diagnosis found.   MDM  Pain following a fall.  Few days ago. No recurrent injury.  No evidence of neurological changes or deficits. Pain.  Controlled in the emergency department  I personally performed the services described in this documentation, which was scribed in my presence. The  recorded information has been reviewed and considered.         Melissa Stairs, MD 09/11/11 1113

## 2011-09-11 NOTE — ED Notes (Signed)
Pt c/o pain in her head, neck, right shoulder and lower back. Pt states that she fell down 12 steps on Friday and was seen in the ED. Pt states that she is in a lot of pain at this time.

## 2011-09-27 ENCOUNTER — Ambulatory Visit (HOSPITAL_COMMUNITY)
Admission: RE | Admit: 2011-09-27 | Discharge: 2011-09-27 | Disposition: A | Discharge status: Home / Self Care | Payer: Medicare Other | Source: Ambulatory Visit | Attending: Family Medicine | Admitting: Family Medicine

## 2011-09-27 ENCOUNTER — Other Ambulatory Visit: Payer: Self-pay | Admitting: Family Medicine

## 2011-09-27 DIAGNOSIS — R51 Headache: Secondary | ICD-10-CM | POA: Diagnosis not present

## 2011-09-27 DIAGNOSIS — Z79899 Other long term (current) drug therapy: Secondary | ICD-10-CM | POA: Diagnosis not present

## 2011-09-27 DIAGNOSIS — M79672 Pain in left foot: Secondary | ICD-10-CM

## 2011-09-27 DIAGNOSIS — M109 Gout, unspecified: Secondary | ICD-10-CM | POA: Diagnosis not present

## 2011-09-27 DIAGNOSIS — M25579 Pain in unspecified ankle and joints of unspecified foot: Secondary | ICD-10-CM | POA: Insufficient documentation

## 2011-09-27 DIAGNOSIS — M79609 Pain in unspecified limb: Secondary | ICD-10-CM | POA: Diagnosis not present

## 2011-09-27 DIAGNOSIS — M542 Cervicalgia: Secondary | ICD-10-CM | POA: Diagnosis not present

## 2011-09-28 DIAGNOSIS — M1990 Unspecified osteoarthritis, unspecified site: Secondary | ICD-10-CM | POA: Diagnosis not present

## 2011-09-28 DIAGNOSIS — Z9181 History of falling: Secondary | ICD-10-CM | POA: Diagnosis not present

## 2011-09-28 DIAGNOSIS — R262 Difficulty in walking, not elsewhere classified: Secondary | ICD-10-CM | POA: Diagnosis not present

## 2011-09-28 DIAGNOSIS — I10 Essential (primary) hypertension: Secondary | ICD-10-CM | POA: Diagnosis not present

## 2011-10-03 DIAGNOSIS — R262 Difficulty in walking, not elsewhere classified: Secondary | ICD-10-CM | POA: Diagnosis not present

## 2011-10-03 DIAGNOSIS — Z9181 History of falling: Secondary | ICD-10-CM | POA: Diagnosis not present

## 2011-10-03 DIAGNOSIS — I10 Essential (primary) hypertension: Secondary | ICD-10-CM | POA: Diagnosis not present

## 2011-10-03 DIAGNOSIS — M1990 Unspecified osteoarthritis, unspecified site: Secondary | ICD-10-CM | POA: Diagnosis not present

## 2011-10-05 DIAGNOSIS — R262 Difficulty in walking, not elsewhere classified: Secondary | ICD-10-CM | POA: Diagnosis not present

## 2011-10-05 DIAGNOSIS — I10 Essential (primary) hypertension: Secondary | ICD-10-CM | POA: Diagnosis not present

## 2011-10-05 DIAGNOSIS — Z9181 History of falling: Secondary | ICD-10-CM | POA: Diagnosis not present

## 2011-10-05 DIAGNOSIS — M1990 Unspecified osteoarthritis, unspecified site: Secondary | ICD-10-CM | POA: Diagnosis not present

## 2011-10-11 DIAGNOSIS — Z9181 History of falling: Secondary | ICD-10-CM | POA: Diagnosis not present

## 2011-10-11 DIAGNOSIS — R262 Difficulty in walking, not elsewhere classified: Secondary | ICD-10-CM | POA: Diagnosis not present

## 2011-10-11 DIAGNOSIS — I10 Essential (primary) hypertension: Secondary | ICD-10-CM | POA: Diagnosis not present

## 2011-10-11 DIAGNOSIS — M1990 Unspecified osteoarthritis, unspecified site: Secondary | ICD-10-CM | POA: Diagnosis not present

## 2011-10-12 DIAGNOSIS — Z09 Encounter for follow-up examination after completed treatment for conditions other than malignant neoplasm: Secondary | ICD-10-CM | POA: Diagnosis not present

## 2011-10-13 DIAGNOSIS — Z9181 History of falling: Secondary | ICD-10-CM | POA: Diagnosis not present

## 2011-10-13 DIAGNOSIS — M1990 Unspecified osteoarthritis, unspecified site: Secondary | ICD-10-CM | POA: Diagnosis not present

## 2011-10-13 DIAGNOSIS — R262 Difficulty in walking, not elsewhere classified: Secondary | ICD-10-CM | POA: Diagnosis not present

## 2011-10-13 DIAGNOSIS — I10 Essential (primary) hypertension: Secondary | ICD-10-CM | POA: Diagnosis not present

## 2011-10-17 DIAGNOSIS — R262 Difficulty in walking, not elsewhere classified: Secondary | ICD-10-CM | POA: Diagnosis not present

## 2011-10-17 DIAGNOSIS — Z9181 History of falling: Secondary | ICD-10-CM | POA: Diagnosis not present

## 2011-10-17 DIAGNOSIS — I10 Essential (primary) hypertension: Secondary | ICD-10-CM | POA: Diagnosis not present

## 2011-10-17 DIAGNOSIS — M1990 Unspecified osteoarthritis, unspecified site: Secondary | ICD-10-CM | POA: Diagnosis not present

## 2011-10-19 DIAGNOSIS — Z9181 History of falling: Secondary | ICD-10-CM | POA: Diagnosis not present

## 2011-10-19 DIAGNOSIS — R262 Difficulty in walking, not elsewhere classified: Secondary | ICD-10-CM | POA: Diagnosis not present

## 2011-10-19 DIAGNOSIS — I10 Essential (primary) hypertension: Secondary | ICD-10-CM | POA: Diagnosis not present

## 2011-10-19 DIAGNOSIS — M1990 Unspecified osteoarthritis, unspecified site: Secondary | ICD-10-CM | POA: Diagnosis not present

## 2011-10-20 DIAGNOSIS — E785 Hyperlipidemia, unspecified: Secondary | ICD-10-CM | POA: Diagnosis not present

## 2011-10-20 DIAGNOSIS — N390 Urinary tract infection, site not specified: Secondary | ICD-10-CM | POA: Diagnosis not present

## 2011-10-20 DIAGNOSIS — R6889 Other general symptoms and signs: Secondary | ICD-10-CM | POA: Diagnosis not present

## 2011-10-23 DIAGNOSIS — M1990 Unspecified osteoarthritis, unspecified site: Secondary | ICD-10-CM | POA: Diagnosis not present

## 2011-10-23 DIAGNOSIS — E782 Mixed hyperlipidemia: Secondary | ICD-10-CM | POA: Diagnosis not present

## 2011-10-23 DIAGNOSIS — Z79899 Other long term (current) drug therapy: Secondary | ICD-10-CM | POA: Diagnosis not present

## 2011-10-23 DIAGNOSIS — R7301 Impaired fasting glucose: Secondary | ICD-10-CM | POA: Diagnosis not present

## 2011-10-23 DIAGNOSIS — R5381 Other malaise: Secondary | ICD-10-CM | POA: Diagnosis not present

## 2011-10-24 DIAGNOSIS — I10 Essential (primary) hypertension: Secondary | ICD-10-CM | POA: Diagnosis not present

## 2011-10-24 DIAGNOSIS — Z9181 History of falling: Secondary | ICD-10-CM | POA: Diagnosis not present

## 2011-10-24 DIAGNOSIS — R262 Difficulty in walking, not elsewhere classified: Secondary | ICD-10-CM | POA: Diagnosis not present

## 2011-10-24 DIAGNOSIS — M1990 Unspecified osteoarthritis, unspecified site: Secondary | ICD-10-CM | POA: Diagnosis not present

## 2011-10-26 DIAGNOSIS — M1990 Unspecified osteoarthritis, unspecified site: Secondary | ICD-10-CM | POA: Diagnosis not present

## 2011-10-26 DIAGNOSIS — R262 Difficulty in walking, not elsewhere classified: Secondary | ICD-10-CM | POA: Diagnosis not present

## 2011-10-26 DIAGNOSIS — Z9181 History of falling: Secondary | ICD-10-CM | POA: Diagnosis not present

## 2011-10-26 DIAGNOSIS — I10 Essential (primary) hypertension: Secondary | ICD-10-CM | POA: Diagnosis not present

## 2011-11-22 ENCOUNTER — Encounter (INDEPENDENT_AMBULATORY_CARE_PROVIDER_SITE_OTHER): Payer: Self-pay

## 2011-11-23 DIAGNOSIS — N813 Complete uterovaginal prolapse: Secondary | ICD-10-CM | POA: Diagnosis not present

## 2011-11-23 DIAGNOSIS — Z1389 Encounter for screening for other disorder: Secondary | ICD-10-CM | POA: Diagnosis not present

## 2012-01-23 DIAGNOSIS — M79609 Pain in unspecified limb: Secondary | ICD-10-CM | POA: Diagnosis not present

## 2012-01-23 DIAGNOSIS — L600 Ingrowing nail: Secondary | ICD-10-CM | POA: Diagnosis not present

## 2012-02-06 DIAGNOSIS — L600 Ingrowing nail: Secondary | ICD-10-CM | POA: Diagnosis not present

## 2012-02-27 DIAGNOSIS — L600 Ingrowing nail: Secondary | ICD-10-CM | POA: Diagnosis not present

## 2012-05-28 DIAGNOSIS — M79609 Pain in unspecified limb: Secondary | ICD-10-CM | POA: Diagnosis not present

## 2012-05-28 DIAGNOSIS — L600 Ingrowing nail: Secondary | ICD-10-CM | POA: Diagnosis not present

## 2012-05-28 DIAGNOSIS — B351 Tinea unguium: Secondary | ICD-10-CM | POA: Diagnosis not present

## 2012-06-13 DIAGNOSIS — Z23 Encounter for immunization: Secondary | ICD-10-CM | POA: Diagnosis not present

## 2012-07-09 DIAGNOSIS — L600 Ingrowing nail: Secondary | ICD-10-CM | POA: Diagnosis not present

## 2012-07-09 DIAGNOSIS — B351 Tinea unguium: Secondary | ICD-10-CM | POA: Diagnosis not present

## 2012-07-09 DIAGNOSIS — M79609 Pain in unspecified limb: Secondary | ICD-10-CM | POA: Diagnosis not present

## 2012-07-30 DIAGNOSIS — M79609 Pain in unspecified limb: Secondary | ICD-10-CM | POA: Diagnosis not present

## 2012-07-30 DIAGNOSIS — B351 Tinea unguium: Secondary | ICD-10-CM | POA: Diagnosis not present

## 2012-07-30 DIAGNOSIS — L600 Ingrowing nail: Secondary | ICD-10-CM | POA: Diagnosis not present

## 2012-08-01 DIAGNOSIS — H52229 Regular astigmatism, unspecified eye: Secondary | ICD-10-CM | POA: Diagnosis not present

## 2012-08-01 DIAGNOSIS — Z961 Presence of intraocular lens: Secondary | ICD-10-CM | POA: Diagnosis not present

## 2012-08-01 DIAGNOSIS — H5200 Hypermetropia, unspecified eye: Secondary | ICD-10-CM | POA: Diagnosis not present

## 2012-08-01 DIAGNOSIS — H524 Presbyopia: Secondary | ICD-10-CM | POA: Diagnosis not present

## 2012-08-27 DIAGNOSIS — M79609 Pain in unspecified limb: Secondary | ICD-10-CM | POA: Diagnosis not present

## 2012-08-27 DIAGNOSIS — B351 Tinea unguium: Secondary | ICD-10-CM | POA: Diagnosis not present

## 2012-08-27 DIAGNOSIS — L600 Ingrowing nail: Secondary | ICD-10-CM | POA: Diagnosis not present

## 2012-10-14 DIAGNOSIS — N63 Unspecified lump in unspecified breast: Secondary | ICD-10-CM | POA: Diagnosis not present

## 2012-10-14 DIAGNOSIS — N644 Mastodynia: Secondary | ICD-10-CM | POA: Diagnosis not present

## 2012-10-17 ENCOUNTER — Other Ambulatory Visit: Payer: Self-pay | Admitting: Radiology

## 2012-10-17 DIAGNOSIS — N6089 Other benign mammary dysplasias of unspecified breast: Secondary | ICD-10-CM | POA: Diagnosis not present

## 2012-10-17 DIAGNOSIS — N63 Unspecified lump in unspecified breast: Secondary | ICD-10-CM | POA: Diagnosis not present

## 2012-10-17 DIAGNOSIS — N644 Mastodynia: Secondary | ICD-10-CM | POA: Diagnosis not present

## 2012-10-17 DIAGNOSIS — N6019 Diffuse cystic mastopathy of unspecified breast: Secondary | ICD-10-CM | POA: Diagnosis not present

## 2012-10-22 ENCOUNTER — Encounter (INDEPENDENT_AMBULATORY_CARE_PROVIDER_SITE_OTHER): Payer: Self-pay

## 2012-10-22 DIAGNOSIS — R0789 Other chest pain: Secondary | ICD-10-CM | POA: Diagnosis not present

## 2012-10-22 DIAGNOSIS — I10 Essential (primary) hypertension: Secondary | ICD-10-CM | POA: Diagnosis not present

## 2012-11-19 ENCOUNTER — Encounter: Payer: Self-pay | Admitting: Internal Medicine

## 2012-11-19 DIAGNOSIS — I10 Essential (primary) hypertension: Secondary | ICD-10-CM | POA: Diagnosis not present

## 2012-11-20 ENCOUNTER — Ambulatory Visit (INDEPENDENT_AMBULATORY_CARE_PROVIDER_SITE_OTHER): Payer: Medicare Other | Admitting: Gastroenterology

## 2012-11-20 ENCOUNTER — Telehealth: Payer: Self-pay

## 2012-11-20 ENCOUNTER — Encounter: Payer: Self-pay | Admitting: Gastroenterology

## 2012-11-20 ENCOUNTER — Other Ambulatory Visit: Payer: Self-pay | Admitting: Gastroenterology

## 2012-11-20 ENCOUNTER — Ambulatory Visit (HOSPITAL_COMMUNITY)
Admission: RE | Admit: 2012-11-20 | Discharge: 2012-11-20 | Disposition: A | Discharge status: Home / Self Care | Payer: Medicare Other | Source: Ambulatory Visit | Attending: Gastroenterology | Admitting: Gastroenterology

## 2012-11-20 VITALS — BP 132/73 | HR 60 | Temp 97.2°F | Ht 66.0 in | Wt 195.2 lb

## 2012-11-20 DIAGNOSIS — R112 Nausea with vomiting, unspecified: Secondary | ICD-10-CM | POA: Diagnosis not present

## 2012-11-20 DIAGNOSIS — K219 Gastro-esophageal reflux disease without esophagitis: Secondary | ICD-10-CM | POA: Diagnosis not present

## 2012-11-20 DIAGNOSIS — R1011 Right upper quadrant pain: Secondary | ICD-10-CM | POA: Insufficient documentation

## 2012-11-20 DIAGNOSIS — K5730 Diverticulosis of large intestine without perforation or abscess without bleeding: Secondary | ICD-10-CM | POA: Diagnosis not present

## 2012-11-20 DIAGNOSIS — K5909 Other constipation: Secondary | ICD-10-CM | POA: Diagnosis not present

## 2012-11-20 DIAGNOSIS — R197 Diarrhea, unspecified: Secondary | ICD-10-CM | POA: Insufficient documentation

## 2012-11-20 DIAGNOSIS — K5904 Chronic idiopathic constipation: Secondary | ICD-10-CM

## 2012-11-20 DIAGNOSIS — R1319 Other dysphagia: Secondary | ICD-10-CM

## 2012-11-20 DIAGNOSIS — K7689 Other specified diseases of liver: Secondary | ICD-10-CM | POA: Diagnosis not present

## 2012-11-20 LAB — CREATININE, SERUM: Creat: 0.63 mg/dL (ref 0.50–1.10)

## 2012-11-20 MED ORDER — IOHEXOL 300 MG/ML  SOLN
100.0000 mL | Freq: Once | INTRAMUSCULAR | Status: AC | PRN
  Administered 2012-11-20: 100 mL via INTRAVENOUS

## 2012-11-20 NOTE — Telephone Encounter (Signed)
REVIEWED.  

## 2012-11-20 NOTE — Assessment & Plan Note (Signed)
INTERMITTENT & ASSOCIATED WITH NAUSEA AND VOMITING-MOST LIKELY DUE TO NSAID GASTRITIS/DUODENITIS.  CONTINUE PREVACID. GET LABS DRAWN. CT ABD/PELVIS OPV IN 4 MOS

## 2012-11-20 NOTE — Patient Instructions (Addendum)
IF YOU USE ARTHRITIS MEDICINE YOU WILL IRRITATE YOUR STOMACH AND SMALL INTESTINES AND HAVE ABDOMINAL PAIN. IF YOUR REFLUX IS NOT UNDER CONTROL YOU WILL HAVE NAUSEA AND THROW UP.   COMPLETE YOUR CT SCAN.  CONTINUE PREVACID. TAKE 30 MINS PRIOR TO MEALS.  FOLLOW A LOW FAT/HIGH FIBER DIET. SEE INFO BELOW.  DO NOT DRINK SODA, KOOL-AID, OR SWEET TEA/COFFEE/GREEN TEA.  DRINK WATER TO KEEP YOUR URINE LIGHT YELLOW.  LOSE 10 LBS.  CONTINUE WITH YOUR EXERCISE PLAN.   FOLLOW UP IN 4 MOS.   Low-Fat Diet BREADS, CEREALS, PASTA, RICE, DRIED PEAS, AND BEANS These products are high in carbohydrates and most are low in fat. Therefore, they can be increased in the diet as substitutes for fatty foods. They too, however, contain calories and should not be eaten in excess. Cereals can be eaten for snacks as well as for breakfast.   FRUITS AND VEGETABLES It is good to eat fruits and vegetables. Besides being sources of fiber, both are rich in vitamins and some minerals. They help you get the daily allowances of these nutrients. Fruits and vegetables can be used for snacks and desserts.  MEATS Limit lean meat, chicken, [place], and fish to no more than 6 ounces per day. Beef, Pork, and Lamb Use lean cuts of beef, pork, and lamb. Lean cuts include:  Extra-lean ground beef.  Arm roast.  Sirloin tip.  Center-cut ham.  Round steak.  Loin chops.  Rump roast.  Tenderloin.  Trim all fat off the outside of meats before cooking. It is not necessary to severely decrease the intake of red meat, but lean choices should be made. Lean meat is rich in protein and contains a highly absorbable form of iron. Premenopausal women, in particular, should avoid reducing lean red meat because this could increase the risk for low red blood cells (iron-deficiency anemia).  Chicken and [place] These are good sources of protein. The fat of poultry can be reduced by removing the skin and underlying fat layers before cooking.  Chicken and [place] can be substituted for lean red meat in the diet. Poultry should not be fried or covered with high-fat sauces. Fish and Shellfish Fish is a good source of protein. Shellfish contain cholesterol, but they usually are low in saturated fatty acids. The preparation of fish is important. Like chicken and [place], they should not be fried or covered with high-fat sauces. EGGS Egg whites contain no fat or cholesterol. They can be eaten often. Try 1 to 2 egg whites instead of whole eggs in recipes or use egg substitutes that do not contain yolk. MILK AND DAIRY PRODUCTS Use skim or 1% milk instead of 2% or whole milk. Decrease whole milk, natural, and processed cheeses. Use nonfat or low-fat (2%) cottage cheese or low-fat cheeses made from vegetable oils. Choose nonfat or low-fat (1 to 2%) yogurt. Experiment with evaporated skim milk in recipes that call for heavy cream. Substitute low-fat yogurt or low-fat cottage cheese for sour cream in dips and salad dressings. Have at least 2 servings of low-fat dairy products, such as 2 glasses of skim (or 1%) milk each day to help get your daily calcium intake. FATS AND OILS Reduce the total intake of fats, especially saturated fat. Butterfat, lard, and beef fats are high in saturated fat and cholesterol. These should be avoided as much as possible. Vegetable fats do not contain cholesterol, but certain vegetable fats, such as coconut oil, palm oil, and palm kernel oil are very high  in saturated fats. These should be limited. These fats are often used in bakery goods, processed foods, popcorn, oils, and nondairy creamers. Vegetable shortenings and some peanut butters contain hydrogenated oils, which are also saturated fats. Read the labels on these foods and check for saturated vegetable oils. Unsaturated vegetable oils and fats do not raise blood cholesterol. However, they should be limited because they are fats and are high in calories. Total fat should  still be limited to 30% of your daily caloric intake. Desirable liquid vegetable oils are corn oil, cottonseed oil, olive oil, canola oil, safflower oil, soybean oil, and sunflower oil. Peanut oil is not as good, but small amounts are acceptable. Buy a heart-healthy tub margarine that has no partially hydrogenated oils in the ingredients. Mayonnaise and salad dressings often are made from unsaturated fats, but they should also be limited because of their high calorie and fat content. Seeds, nuts, peanut butter, olives, and avocados are high in fat, but the fat is mainly the unsaturated type. These foods should be limited mainly to avoid excess calories and fat. OTHER EATING TIPS Snacks  Most sweets should be limited as snacks. They tend to be rich in calories and fats, and their caloric content outweighs their nutritional value. Some good choices in snacks are graham crackers, melba toast, soda crackers, bagels (no egg), English muffins, fruits, and vegetables. These snacks are preferable to snack crackers, [place] fries, TORTILLA CHIPS, and POTATO chips. Popcorn should be air-popped or cooked in small amounts of liquid vegetable oil. Desserts Eat fruit, low-fat yogurt, and fruit ices instead of pastries, cake, and cookies. Sherbet, angel food cake, gelatin dessert, frozen low-fat yogurt, or other frozen products that do not contain saturated fat (pure fruit juice bars, frozen ice pops) are also acceptable.  COOKING METHODS Choose those methods that use little or no fat. They include: Poaching.  Braising.  Steaming.  Grilling.  Baking.  Stir-frying.  Broiling.  Microwaving.  Foods can be cooked in a nonstick pan without added fat, or use a nonfat cooking spray in regular cookware. Limit fried foods and avoid frying in saturated fat. Add moisture to lean meats by using water, broth, cooking wines, and other nonfat or low-fat sauces along with the cooking methods mentioned above. Soups and stews  should be chilled after cooking. The fat that forms on top after a few hours in the refrigerator should be skimmed off. When preparing meals, avoid using excess salt. Salt can contribute to raising blood pressure in some people.  EATING AWAY FROM HOME Order entres, potatoes, and vegetables without sauces or butter. When meat exceeds the size of a deck of cards (3 to 4 ounces), the rest can be taken home for another meal. Choose vegetable or fruit salads and ask for low-calorie salad dressings to be served on the side. Use dressings sparingly. Limit high-fat toppings, such as bacon, crumbled eggs, cheese, sunflower seeds, and olives. Ask for heart-healthy tub margarine instead of butter.  High-Fiber Diet A high-fiber diet changes your normal diet to include more whole grains, legumes, fruits, and vegetables. Changes in the diet involve replacing refined carbohydrates with unrefined foods. The calorie level of the diet is essentially unchanged. The Dietary Reference Intake (recommended amount) for adult males is 38 grams per day. For adult females, it is 25 grams per day. Pregnant and lactating women should consume 28 grams of fiber per day. Fiber is the intact part of a plant that is not broken down during digestion. Functional fiber  is fiber that has been isolated from the plant to provide a beneficial effect in the body. PURPOSE  Increase stool bulk.   Ease and regulate bowel movements.   Lower cholesterol.  INDICATIONS THAT YOU NEED MORE FIBER  Constipation and hemorrhoids.   Uncomplicated diverticulosis (intestine condition) and irritable bowel syndrome.   Weight management.   As a protective measure against hardening of the arteries (atherosclerosis), diabetes, and cancer.   GUIDELINES FOR INCREASING FIBER IN THE DIET  Start adding fiber to the diet slowly. A gradual increase of about 5 more grams (2 slices of whole-wheat bread, 2 servings of most fruits or vegetables, or 1 bowl of  high-fiber cereal) per day is best. Too rapid an increase in fiber may result in constipation, flatulence, and bloating.   Drink enough water and fluids to keep your urine clear or pale yellow. Water, juice, or caffeine-free drinks are recommended. Not drinking enough fluid may cause constipation.   Eat a variety of high-fiber foods rather than one type of fiber.   Try to increase your intake of fiber through using high-fiber foods rather than fiber pills or supplements that contain small amounts of fiber.   The goal is to change the types of food eaten. Do not supplement your present diet with high-fiber foods, but replace foods in your present diet.  INCLUDE A VARIETY OF FIBER SOURCES  Replace refined and processed grains with whole grains, canned fruits with fresh fruits, and incorporate other fiber sources. White rice, white breads, and most bakery goods contain little or no fiber.   Brown whole-grain rice, buckwheat oats, and many fruits and vegetables are all good sources of fiber. These include: broccoli, Brussels sprouts, cabbage, cauliflower, beets, sweet potatoes, white potatoes (skin on), carrots, tomatoes, eggplant, squash, berries, fresh fruits, and dried fruits.   Cereals appear to be the richest source of fiber. Cereal fiber is found in whole grains and bran. Bran is the fiber-rich outer coat of cereal grain, which is largely removed in refining. In whole-grain cereals, the bran remains. In breakfast cereals, the largest amount of fiber is found in those with "bran" in their names. The fiber content is sometimes indicated on the label.   You may need to include additional fruits and vegetables each day.   In baking, for 1 cup white flour, you may use the following substitutions:   1 cup whole-wheat flour minus 2 tablespoons.   1/2 cup white flour plus 1/2 cup whole-wheat flour.

## 2012-11-20 NOTE — Progress Notes (Signed)
Faxed to PCP

## 2012-11-20 NOTE — Progress Notes (Signed)
  Subjective:    Patient ID: Melissa Barajas, female    DOB: [birth year], 72 y.o.   MRN: [id number]  PCP: Gerda Diss  HPI SHARP PAIN ON RIGHT NAVEL AND FEELS DIZZY/NAUSEA EVER NOW AND THEN. EATING OATMEAL AND GOT SICK/THROW IT UP. NO PROBLEMS SWALLOWING. HASN'T HAD ANY IMAGING SINCE PAIN BEGAN. PAIN 1-2X/WEEK. LASTS SECS. NO NAUSEA OR VOMITING. BURNING CHEST PAIN: 1-2 X/WEEK. BMs: TAKES SOMETHINGS OR EAT PRUNES TO MAKE THEM MOVE. MOSTLY HARD BA;LLS. "TRIED TO DRINK WATER". DOESN'T EAT MUCH FIBER. NO KNOWN TRIGGERS. JUST GETS BETTER ON ITS OWN. WAS MOR FREQUENT BUT NOW IT'S BETTER. DIARRHEA 1-2X IN FEB. GAINED 12 LBS IN PAST 2 YEARS. DOESN'T EXERCISE MUCH BECAUSE HER HUSBAND NEEDS PERSONAL CARE. CONCERNED SHE HAS SOMETHING SERIOUSLY WRONG.  PT DENIES FEVER, CHILLS, BRBPR, melena.  Past Medical History  Diagnosis Date  . Hypertension   . GERD (gastroesophageal reflux disease)   . Hypercholesteremia    Past Surgical History  Procedure Laterality Date  . 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 Thyroidectomy    . Cystectomy      Left breast  . Tumor   05/2010    Left breast under nipple  . Esophagogastroduodenoscopy      WUJ:[id number] appearing Schatzki's ring, possible cervical esophageal web, both dilated with passage of a Maloney dilator/small hiatal hernia   No Known Allergies  Current Outpatient Prescriptions  Medication Sig Dispense Refill  . ARTHRITIS MEDICINE??       . diltiazem (CARDIZEM CD) 300 MG 24 hr capsule Take 300 mg by mouth daily.        . lansoprazole (PREVACID) 30 MG capsule Take 30 mg by mouth at bedtime.       . potassium chloride (K-DUR,KLOR-CON) 10 MEQ tablet Take 10 mEq by mouth daily.      . pravastatin (PRAVACHOL) 40 MG tablet Take 40 mg by mouth daily.       . Calcium Carbonate-Vitamin D (CALTRATE 600+D PO) Take 1 tablet by mouth daily.       . fish oil-omega-3 fatty acids 1000 MG capsule Take 1-2 g by mouth daily.           Review of  Systems     Objective:   Physical Exam  Vitals reviewed. Constitutional: She is oriented to person, place, and time. She appears well-nourished. No distress.  HENT:  Head: Normocephalic and atraumatic.  Mouth/Throat: Oropharynx is clear and moist. No oropharyngeal exudate.  Eyes: Pupils are equal, round, and reactive to light. No scleral icterus.  Neck: Normal range of motion. Neck supple.  Cardiovascular: Normal rate, regular rhythm and normal heart sounds.   Pulmonary/Chest: Effort normal and breath sounds normal. No respiratory distress.  Abdominal: Soft. Bowel sounds are normal. She exhibits no distension. There is no tenderness.  Musculoskeletal: She exhibits no edema.  Lymphadenopathy:    She has no cervical adenopathy.  Neurological: She is alert and oriented to person, place, and time.  NO FOCAL DEFICITS   Psychiatric: She has a normal mood and affect.          Assessment & Plan:

## 2012-11-20 NOTE — Assessment & Plan Note (Signed)
SX NOT IDEALLY CONTROLLED MOST LIKELY DUE TO DIET/WEIGHT GAIN.  LOSE 10 LBS. LOW FAT DIET PREVACID PRIOR TO MEALS OPV IN 4 MOS

## 2012-11-20 NOTE — Assessment & Plan Note (Signed)
RESOLVED. 

## 2012-11-20 NOTE — Assessment & Plan Note (Signed)
MOST LIKELY DUE TO LOW FIBER DIET AND STRESS OF CARING FOR AN ILL HUSBAND  HIGH FIBER DIET  DRINK WATER OPV IN 4 MOS

## 2012-11-21 NOTE — Progress Notes (Signed)
PLEASE CALL PT. HER CT SHOWS FAT IN HER LIVER. SHE DOES NOT HAVE CANCER.  CONTINUE PREVACID. TAKE 30 MINS PRIOR TO MEALS.  FOLLOW A LOW FAT/HIGH FIBER DIET.  DRINK WATER TO KEEP YOUR URINE LIGHT YELLOW.  CONTINUE WITH YOUR EXERCISE PLAN.  LOSE 10 LBS.  FOLLOW UP IN 4 MOS.

## 2012-11-21 NOTE — Progress Notes (Signed)
Called and informed pt.  

## 2012-11-21 NOTE — Progress Notes (Signed)
Faxed to PCP

## 2012-11-29 NOTE — Progress Notes (Signed)
Reminder in epic °

## 2012-12-10 ENCOUNTER — Other Ambulatory Visit: Payer: Self-pay | Admitting: *Deleted

## 2012-12-10 ENCOUNTER — Telehealth: Payer: Self-pay | Admitting: Family Medicine

## 2012-12-10 MED ORDER — DILTIAZEM HCL ER COATED BEADS 300 MG PO CP24: 300.0000 mg | ORAL_CAPSULE | Freq: Every day | ORAL | Status: DC

## 2012-12-10 NOTE — Telephone Encounter (Signed)
Nurse: 3:33PM Pharm RX Request: Mail Order Delivery RX: Diltiazem 300 mg.  MSG: Patient is needing a 3 mo. Order Refill on the medication. * Please call when script is ready  CB: Cell phone: [phone] - if unable to reach via landline

## 2012-12-10 NOTE — Telephone Encounter (Signed)
rx ready to pick up pt notified

## 2013-01-07 ENCOUNTER — Other Ambulatory Visit: Payer: Self-pay | Admitting: Family Medicine

## 2013-01-07 NOTE — Telephone Encounter (Signed)
Ok times 4 total 

## 2013-01-07 NOTE — Telephone Encounter (Signed)
Med called in to [facility]  

## 2013-01-08 ENCOUNTER — Encounter: Payer: Self-pay | Admitting: *Deleted

## 2013-01-21 DIAGNOSIS — Z09 Encounter for follow-up examination after completed treatment for conditions other than malignant neoplasm: Secondary | ICD-10-CM | POA: Diagnosis not present

## 2013-02-18 ENCOUNTER — Encounter: Payer: Self-pay | Admitting: Family Medicine

## 2013-02-18 ENCOUNTER — Ambulatory Visit (INDEPENDENT_AMBULATORY_CARE_PROVIDER_SITE_OTHER): Payer: Medicare Other | Admitting: Family Medicine

## 2013-02-18 VITALS — BP 128/75 | HR 68 | Wt 182.4 lb

## 2013-02-18 DIAGNOSIS — E785 Hyperlipidemia, unspecified: Secondary | ICD-10-CM

## 2013-02-18 DIAGNOSIS — K219 Gastro-esophageal reflux disease without esophagitis: Secondary | ICD-10-CM | POA: Diagnosis not present

## 2013-02-18 DIAGNOSIS — I10 Essential (primary) hypertension: Secondary | ICD-10-CM | POA: Diagnosis not present

## 2013-02-18 NOTE — Progress Notes (Signed)
  Subjective:    Patient ID: Melissa Barajas, female    DOB: 08/08/1941, 72 y.o.   MRN: [id number]  HPI 3 month check up for hyperlipidemia, hypertension.  States doing well.  Patient overall doing well watching her diet stain active she has lost about 20 pounds which is been very helpful for her. She denies any chest tightness pressure pain shortness breath vomiting diarrhea headaches. She is resting okay. PMH hyperlipidemia hypertension, arthritis, artificial knees.   Review of Systems See above.    Objective:   Physical Exam Lungs are clear hearts regular pulse normal blood pressure good extremities no edema skin warm dry neurologic grossly normal       Assessment & Plan:  Hypertension-decent control overall continue current measures exercise continue to lose weight Hyperlipidemia lab work recently looked good followup in the fall time lab work at that time.

## 2013-03-15 ENCOUNTER — Emergency Department (HOSPITAL_COMMUNITY)
Admission: EM | Admit: 2013-03-15 | Discharge: 2013-03-15 | Disposition: A | Discharge status: Home / Self Care | Payer: Medicare Other | Attending: Emergency Medicine | Admitting: Emergency Medicine

## 2013-03-15 ENCOUNTER — Encounter (HOSPITAL_COMMUNITY): Payer: Self-pay | Admitting: *Deleted

## 2013-03-15 DIAGNOSIS — I4891 Unspecified atrial fibrillation: Secondary | ICD-10-CM | POA: Insufficient documentation

## 2013-03-15 DIAGNOSIS — K219 Gastro-esophageal reflux disease without esophagitis: Secondary | ICD-10-CM | POA: Diagnosis not present

## 2013-03-15 DIAGNOSIS — Z79899 Other long term (current) drug therapy: Secondary | ICD-10-CM | POA: Diagnosis not present

## 2013-03-15 DIAGNOSIS — W57XXXA Bitten or stung by nonvenomous insect and other nonvenomous arthropods, initial encounter: Secondary | ICD-10-CM | POA: Insufficient documentation

## 2013-03-15 DIAGNOSIS — S30860A Insect bite (nonvenomous) of lower back and pelvis, initial encounter: Secondary | ICD-10-CM | POA: Diagnosis not present

## 2013-03-15 DIAGNOSIS — R42 Dizziness and giddiness: Secondary | ICD-10-CM | POA: Diagnosis not present

## 2013-03-15 DIAGNOSIS — I10 Essential (primary) hypertension: Secondary | ICD-10-CM | POA: Diagnosis not present

## 2013-03-15 DIAGNOSIS — E7800 Pure hypercholesterolemia, unspecified: Secondary | ICD-10-CM | POA: Diagnosis not present

## 2013-03-15 DIAGNOSIS — M129 Arthropathy, unspecified: Secondary | ICD-10-CM | POA: Diagnosis not present

## 2013-03-15 DIAGNOSIS — Z872 Personal history of diseases of the skin and subcutaneous tissue: Secondary | ICD-10-CM | POA: Insufficient documentation

## 2013-03-15 DIAGNOSIS — Y9389 Activity, other specified: Secondary | ICD-10-CM | POA: Insufficient documentation

## 2013-03-15 DIAGNOSIS — Y929 Unspecified place or not applicable: Secondary | ICD-10-CM | POA: Insufficient documentation

## 2013-03-15 DIAGNOSIS — S30861A Insect bite (nonvenomous) of abdominal wall, initial encounter: Secondary | ICD-10-CM

## 2013-03-15 DIAGNOSIS — L299 Pruritus, unspecified: Secondary | ICD-10-CM | POA: Insufficient documentation

## 2013-03-15 NOTE — ED Notes (Signed)
EDP at bedside. Tick removed from pt.

## 2013-03-15 NOTE — ED Notes (Addendum)
Pt states a tick has been on her abdomen all week, which she thought was a mole. Pt states slight, intermittent dizziness all week.

## 2013-03-15 NOTE — ED Provider Notes (Signed)
History  This chart was scribed for Melissa Jakes, MD by Ardelia Mems, ED Scribe. This patient was seen in room APA11/APA11 and the patient's care was started at 4:47 PM.  CSN: [id number]  Arrival date & time 03/15/13  1616   Chief Complaint  Patient presents with  . Dizziness  . Tick Removal    The history is provided by the patient. No language interpreter was used.   HPI Comments: Melissa Barajas is a 72 y.o. female with a hx of HTN who presents to the Emergency Department requesting removal of a tick that has been on her abdomen for 4-5 days. Pt states that she has been having associated severe itching at the area. She states that she has not removed the tick since she believed it was a mole. She states that when she discovered it was a tick, she wanted to come to the ED just to be safe. Pt reports intermittent dizziness over the last 2-3 days, but she denies any other symptoms or pain. She denies fever, chills, SOB, nausea, vomiting, diarrhea, dysuria, rash, headaches or any other symptoms. She denies alcohol use and denies any hx of smoking.  PCP- Dr. [name]   Past Medical History  Diagnosis Date  . Hypertension   . GERD (gastroesophageal reflux disease)   . Hypercholesteremia   . Atrial fibrillation   . Arthritis   . Lipoma of arm     Left   Past Surgical History  Procedure Laterality Date  . 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 Thyroidectomy    . Cystectomy      Left breast  . Tumor   05/2010    Left breast under nipple  . Esophagogastroduodenoscopy      WUJ:WJXBJYNWGNF appearing Schatzki's ring, possible cervical esophageal web, both dilated with passage of a Maloney dilator/small hiatal hernia  . Abdominal hysterectomy      Partial  . Colonoscopy     Family History  Problem Relation Age of Onset  . Cancer Brother     Prostate   History  Substance Use Topics  . Smoking status: Never Smoker   . Smokeless tobacco: Not on file   . Alcohol Use: No   OB History   Grav Para Term Preterm Abortions TAB SAB Ect Mult Living                 Review of Systems  Constitutional: Negative for fever and chills.  HENT: Negative for congestion, sore throat and rhinorrhea.   Eyes: Negative for visual disturbance.  Respiratory: Negative for cough and shortness of breath.   Cardiovascular: Negative for chest pain and leg swelling.  Gastrointestinal: Negative for nausea, vomiting, abdominal pain and diarrhea.  Genitourinary: Negative for dysuria.  Musculoskeletal: Negative for myalgias.  Skin: Negative for rash.  Neurological: Negative for headaches.  Hematological: Does not bruise/bleed easily.  Psychiatric/Behavioral: Negative for confusion.  A complete 10 system review of systems was obtained and all systems are negative except as noted in the HPI and PMH.    Allergies  Hydrocodone; Lisinopril; and Vioxx  Home Medications   Current Outpatient Rx  Name  Route  Sig  Dispense  Refill  . Calcium Carbonate-Vitamin D (CALTRATE 600+D PO)   Oral   Take 1 tablet by mouth daily.          Marland Kitchen diltiazem (CARDIZEM CD) 300 MG 24 hr capsule   Oral   Take 1 capsule (  300 mg total) by mouth daily.   90 capsule   1   . fish oil-omega-3 fatty acids 1000 MG capsule   Oral   Take 1-2 g by mouth daily.          . hydrochlorothiazide (HYDRODIURIL) 25 MG tablet   Oral   Take 25 mg by mouth daily.         . lansoprazole (PREVACID) 30 MG capsule   Oral   Take 30 mg by mouth at bedtime.          . naproxen (NAPROSYN) 375 MG tablet   Oral   Take 375 mg by mouth 2 (two) times daily with a meal.         . NON FORMULARY      Stress Tab  One tablet daily         . potassium chloride (K-DUR,KLOR-CON) 10 MEQ tablet   Oral   Take 10 mEq by mouth daily.         . pravastatin (PRAVACHOL) 40 MG tablet   Oral   Take 40 mg by mouth daily.          . temazepam (RESTORIL) 30 MG capsule   Oral   Take 30 mg by mouth  at bedtime.          Triage Vitals: BP 179/61  Temp(Src) 98.1 F (36.7 C) (Oral)  Resp 16  Ht 5' 6.5" (1.689 m)  Wt 182 lb (82.555 kg)  BMI 28.94 kg/m2  SpO2 97%  Physical Exam  Nursing note and vitals reviewed. Constitutional: She is oriented to person, place, and time. She appears well-developed and well-nourished.  HENT:  Head: Normocephalic and atraumatic.  Eyes: EOM are normal. Pupils are equal, round, and reactive to light.  Neck: Normal range of motion. Neck supple.  Cardiovascular: Normal rate, regular rhythm and normal heart sounds.   No murmur heard. Pulmonary/Chest: Effort normal and breath sounds normal. No respiratory distress.  Abdominal: Soft. Bowel sounds are normal. There is no tenderness.  Musculoskeletal: Normal range of motion. She exhibits no tenderness.  Neurological: She is alert and oriented to person, place, and time.  Skin: Skin is warm and dry. No rash noted.  Psychiatric: She has a normal mood and affect.    ED Course  Procedures (including critical care time)  DIAGNOSTIC STUDIES: Oxygen Saturation is 97% on RA, normal by my interpretation.    COORDINATION OF CARE: 4:51 PM- Pt advised of plan for treatment and pt agrees.     Labs Reviewed - No data to display  Tick was smothered the with lubricant. And then removed with a forceps.  No results found.  1. Tick bite of abdomen, initial encounter     MDM  Patient without any rash or flulike symptoms or fever to be suggestive of tick born illness. Patient with mild allergic reaction to the side of a tickle in the abdomen. Patient will return for development of any unusual rash or flulike symptoms.        I personally performed the services described in this documentation, which was scribed in my presence. The recorded information has been reviewed and is accurate.     Melissa Jakes, MD 03/15/13 [phone]

## 2013-03-17 ENCOUNTER — Ambulatory Visit (INDEPENDENT_AMBULATORY_CARE_PROVIDER_SITE_OTHER): Payer: Medicare Other | Admitting: Family Medicine

## 2013-03-17 ENCOUNTER — Encounter: Payer: Self-pay | Admitting: Family Medicine

## 2013-03-17 VITALS — BP 126/70 | Temp 98.5°F | Wt 181.4 lb

## 2013-03-17 DIAGNOSIS — R21 Rash and other nonspecific skin eruption: Secondary | ICD-10-CM

## 2013-03-17 NOTE — Patient Instructions (Signed)
This is a local allergic reaction. Watch out for fevers, headaches, and rash cropping up elsewhere chance of that only one out of 200.

## 2013-03-17 NOTE — Progress Notes (Signed)
  Subjective:    Patient ID: Melissa Barajas, female    DOB: [birth year], 72 y.o.   MRN: [id number]  HPI Had tick present for sevral days. Stayed on. Felt dizzy at times.  Itching locally, developed rash.  Small tick, removed at the ER.   Review of Systems No rash elsewhere. No headache. No fever.    Objective:   Physical Exam  Alert no acute distress. Lungs clear. Heart regular rate and rhythm. HEENT normal. Small patch of swelling around tick bite.      Assessment & Plan:  Impression tick bite with remnant allergic reaction. No evidence of tickborne illness discussed at length. Plan symptomatic care discussed. WSL

## 2013-03-25 ENCOUNTER — Ambulatory Visit (INDEPENDENT_AMBULATORY_CARE_PROVIDER_SITE_OTHER): Payer: Medicare Other | Admitting: Obstetrics & Gynecology

## 2013-03-25 ENCOUNTER — Encounter: Payer: Self-pay | Admitting: Obstetrics & Gynecology

## 2013-03-25 VITALS — BP 140/70 | Ht 66.0 in | Wt 181.0 lb

## 2013-03-25 DIAGNOSIS — N3941 Urge incontinence: Secondary | ICD-10-CM | POA: Insufficient documentation

## 2013-03-25 DIAGNOSIS — N993 Prolapse of vaginal vault after hysterectomy: Secondary | ICD-10-CM | POA: Diagnosis not present

## 2013-03-25 MED ORDER — MIRABEGRON ER 25 MG PO TB24: 25.0000 mg | ORAL_TABLET | Freq: Every day | ORAL | Status: DC

## 2013-03-25 NOTE — Patient Instructions (Signed)
Prolapse  Prolapse means the falling down, bulging, dropping, or drooping of a body part. Organs that commonly prolapse include the rectum, small intestine, bladder, urethra, vagina (birth canal), uterus (womb), and cervix. Prolapse occurs when the ligaments and muscle tissue around the rectum, bladder, and uterus are damaged or weakened.  CAUSES  This happens especially with:  Childbirth. Some women feel pelvic pressure or have trouble holding their urine right after childbirth, because of stretching and tearing of pelvic tissues. This generally gets better with time and the feeling usually goes away, but it may return with aging.  Chronic heavy lifting.  Aging.  Menopause, with loss of estrogen production weakening the pelvic ligaments and muscles.  Past pelvic surgery.  Obesity.  Chronic constipation.  Chronic cough. Prolapse may affect a single organ, or several organs may prolapse at the same time. The front wall of the vagina holds up the bladder. The back wall holds up part of the lower intestine, or rectum. The uterus fills a spot in the middle. All these organs can be involved when the ligaments and muscles around the vagina relax too much. This often gets worse when women stop producing estrogen (menopause). SYMPTOMS  Uncontrolled loss of urine (incontinence) with cough, sneeze, straining, and exercise.  More force may be required to have a bowel movement, due to trapping of the stool.  When part of an organ bulges through the opening of the vagina, there is sometimes a feeling of heaviness or pressure. It may feel as though something is falling out. This sensation increases with coughing or bearing down.  If the organs protrude through the opening of the vagina and rub against the clothing, there may be soreness, ulcers, infection, pain, and bleeding.  Lower back pain.  Pushing in the upper or lower part of the vagina, to pass urine or have a bowel movement.  Problems  having sexual intercourse.  Being unable to insert a tampon or applicator. DIAGNOSIS  Usually, a physical exam is all that is needed to identify the problem. During the examination, you may be asked to cough and strain while lying down, sitting up, and standing up. Your caregiver will determine if more testing is required, such as bladder function tests. Some diagnoses are:  Cystocele: Bulging and falling of the bladder into the top of the vagina.  Rectocele: Part of the rectum bulging into the vagina.  Prolapse of the uterus: The uterus falls or drops into the vagina.  Enterocele: Bulging of the top of the vagina, after a hysterectomy (uterus removal), with the small intestine bulging into the vagina. A hernia in the top of the vagina.  Urethrocele: The urethra (urine carrying tube) bulging into the vagina. TREATMENT  In most cases, prolapse needs to be treated only if it produces symptoms. If the symptoms are interfering with your usual daily or sexual activities, treatment may be necessary. The following are some measures that may be used to treat prolapse.  Estrogen may help elderly women with mild prolapse.  Kegel exercises may help mild cases of prolapse, by strengthening and tightening the muscles of the pelvic floor.  Pessaries are used in women who choose not to, or are unable to, have surgery. A pessary is a doughnut-shaped piece of plastic or rubber that is put into the vagina to keep the organs in place. This device must be fitted by your caregiver. Your caregiver will also explain how to care for yourself with the pessary. If it works well for you,   this may be the only treatment required.  Surgery is often the only form of treatment for more severe prolapses. There are different types of surgery available. You should discuss what the best procedure is for you. If the uterus is prolapsed, it may be removed (hysterectomy) as part of the surgical treatment. Your caregiver will  discuss the risks and benefits with you.  Uterine-vaginal suspension (surgery to hold up the organs) may be used, especially if you want to maintain your fertility. No form of treatment is guaranteed to correct the prolapse or relieve the symptoms. HOME CARE INSTRUCTIONS   Wear a sanitary pad or absorbent product if you have incontinence of urine.  Avoid heavy lifting and straining with exercise and work.  Take over-the-counter pain medicine for minor discomfort.  Try taking estrogen or using estrogen vaginal cream.  Try Kegel exercises or use a pessary, before deciding to have surgery.  Do Kegel exercises after having a baby. SEEK MEDICAL CARE IF:   Your symptoms interfere with your daily activities.  You need medicine to help with the discomfort.  You need to be fitted with a pessary.  You notice bleeding from the vagina.  You think you have ulcers or you notice ulcers on the cervix.  You have an oral temperature above 102 F (38.9 C).  You develop pain or blood with urination.  You have bleeding with a bowel movement.  The symptoms are interfering with your sex life.  You have urinary incontinence that interferes with your daily activities.  You lose urine with sexual intercourse.  You have a chronic cough.  You have chronic constipation. Document Released: 03/04/2003 Document Revised: 11/20/2011 Document Reviewed: 09/12/2009 [employer] Patient Information 2014 [employer].  

## 2013-03-25 NOTE — Progress Notes (Signed)
Patient ID: Melissa Barajas, female   DOB: 11-30-1940, 72 y.o.   MRN: [id number] Subjective:     Melissa Barajas is a 72 y.o. female here for a routine exam.  No LMP recorded. Patient has had a hysterectomy. No obstetric history on file. Current complaints: prolapse.  Personal health questionnaire reviewed: yes.   Gynecologic History No LMP recorded. Patient has had a hysterectomy. Contraception: status post hysterectomy Last Pap: na. Results were: na Last mammogram: 2014. Results were: normal  Obstetric History OB History   Grav Para Term Preterm Abortions TAB SAB Ect Mult Living                   The following portions of the patient's history were reviewed and updated as appropriate: allergies, current medications, past family history, past medical history, past social history, past surgical history and problem list.  Review of Systems  Review of Systems  Constitutional: Negative for fever, chills, weight loss, malaise/fatigue and diaphoresis.  HENT: Negative for hearing loss, ear pain, nosebleeds, congestion, sore throat, neck pain, tinnitus and ear discharge.   Eyes: Negative for blurred vision, double vision, photophobia, pain, discharge and redness.  Respiratory: Negative for cough, hemoptysis, sputum production, shortness of breath, wheezing and stridor.   Cardiovascular: Negative for chest pain, palpitations, orthopnea, claudication, leg swelling and PND.  Gastrointestinal: negative for abdominal pain. Negative for heartburn, nausea, vomiting, diarrhea, constipation, blood in stool and melena.  Genitourinary: Negative for dysuria, urgency, frequency, hematuria and flank pain.  Musculoskeletal: Negative for myalgias, back pain, joint pain and falls.  Skin: Negative for itching and rash.  Neurological: Negative for dizziness, tingling, tremors, sensory change, speech change, focal weakness, seizures, loss of consciousness, weakness and headaches.  Endo/Heme/Allergies: Negative for  environmental allergies and polydipsia. Does not bruise/bleed easily.  Psychiatric/Behavioral: Negative for depression, suicidal ideas, hallucinations, memory loss and substance abuse. The patient is not nervous/anxious and does not have insomnia.        Objective:    Physical Exam  Vitals reviewed. Constitutional: She is oriented to person, place, and time. She appears well-developed and well-nourished.  HENT:  Head: Normocephalic and atraumatic.        Right Ear: External ear normal.  Left Ear: External ear normal.  Nose: Nose normal.  Mouth/Throat: Oropharynx is clear and moist.  Eyes: Conjunctivae and EOM are normal. Pupils are equal, round, and reactive to light. Right eye exhibits no discharge. Left eye exhibits no discharge. No scleral icterus.  Neck: Normal range of motion. Neck supple. No tracheal deviation present. No thyromegaly present.  Cardiovascular: Normal rate, regular rhythm, normal heart sounds and intact distal pulses.  Exam reveals no gallop and no friction rub.   No murmur heard. Respiratory: Effort normal and breath sounds normal. No respiratory distress. She has no wheezes. She has no rales. She exhibits no tenderness.  GI: Soft. Bowel sounds are normal. She exhibits no distension and no mass. There is no tenderness. There is no rebound and no guarding.  Genitourinary:       Vulva is normal without lesions Vagina is pink moist without discharge, Grade II-III vault prolapse Cervix absent Uterus is absent Adnexa is absent Musculoskeletal: Normal range of motion. She exhibits no edema and no tenderness.  Neurological: She is alert and oriented to person, place, and time. She has normal reflexes. She displays normal reflexes. No cranial nerve deficit. She exhibits normal muscle tone. Coordination normal.  Skin: Skin is warm and dry. No rash noted.  No erythema. No pallor.  Psychiatric: She has a normal mood and affect. Her behavior is normal. Judgment and thought  content normal.       Assessment:    Healthy female exam.   Urge incontinence Plan:    Follow up in: 3 months. Myrbetriq 25 qhs

## 2013-03-26 ENCOUNTER — Telehealth: Payer: Self-pay | Admitting: Obstetrics & Gynecology

## 2013-03-26 NOTE — Telephone Encounter (Signed)
Myrbetriq 25 mg #30 take 1 tab by mouth daily with 11 refills called into [facility] in [place]. Did not go through electronically yesterday. Pt aware.JSY

## 2013-04-09 ENCOUNTER — Encounter: Payer: Self-pay | Admitting: Gastroenterology

## 2013-04-09 ENCOUNTER — Ambulatory Visit (INDEPENDENT_AMBULATORY_CARE_PROVIDER_SITE_OTHER): Payer: Medicare Other | Admitting: Gastroenterology

## 2013-04-09 VITALS — BP 140/67 | HR 54 | Temp 98.0°F | Ht 66.0 in | Wt 178.4 lb

## 2013-04-09 DIAGNOSIS — K219 Gastro-esophageal reflux disease without esophagitis: Secondary | ICD-10-CM | POA: Diagnosis not present

## 2013-04-09 DIAGNOSIS — R1011 Right upper quadrant pain: Secondary | ICD-10-CM | POA: Diagnosis not present

## 2013-04-09 NOTE — Assessment & Plan Note (Signed)
CONTROLLED.  CONTINUE PREVACID. TAKE 30 MINS PRIOR TO MEALS.  FOLLOW A LOW FAT/HIGH FIBER DIET.   LOSE MORE 10 LBS.  CONTINUE WITH EXERCISE PLAN.  FOLLOW UP IN 6 MOS.

## 2013-04-09 NOTE — Patient Instructions (Signed)
CONTINUE WITH YOUR WEIGHT LOSS AND EXERCISE PLAN.   LOSE 10 LBS.   YOU CAN CONTACT ASHELEY COOPER AT [phone] COUNCIL ON AGING TO SEE WHAT RESPITE CARE IS AVAILABLE.  CONTINUE PREVACID. TAKE 30 MINS PRIOR TO MEALS.   FOLLOW A LOW FAT/HIGH FIBER DIET.   DRINK WATER TO KEEP YOUR URINE LIGHT YELLOW.   FOLLOW UP IN 6 MOS.

## 2013-04-09 NOTE — Progress Notes (Signed)
Subjective:    Patient ID: Melissa Barajas, female    DOB: [birth year], 72 y.o.   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 BMs: BETTER WITH JUICING.  EATING MORE FIBER AND DRINKING MORE WATER. GOES JUST ABOUT EVERY DAY. RARE NAUSEA. COMES AND GOES. LASTS SECS. COUPLE BOUTS OF CONSTIPATION BUT DRINKS MG CITRATE AND IT WORKS.   PT DENIES FEVER, CHILLS, BRBPR, vomiting, melena, diarrhea, constipation, abd pain, problems swallowing. NO heartburn or indigestion WITH PREVACID BEFORE BED.Marland Kitchen  Past Medical History  Diagnosis Date  . Hypertension   . GERD (gastroesophageal reflux disease)   . Hypercholesteremia   . Atrial fibrillation   . Arthritis   . Lipoma of arm     Left   Past Surgical History  Procedure Laterality Date  . 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 Thyroidectomy    . Cystectomy      Left breast  . Tumor   05/2010    Left breast under nipple  . Esophagogastroduodenoscopy      [id number] appearing Schatzki's ring, possible cervical esophageal web, both dilated with passage of a Maloney dilator/small hiatal hernia  . Abdominal hysterectomy      Partial  . Colonoscopy     Allergies  Allergen Reactions  . Hydrocodone Nausea Only  . Lisinopril     Angio edema  . Vioxx (Rofecoxib)    Current Outpatient Prescriptions  Medication Sig Dispense Refill  . Calcium Carbonate-Vitamin D (CALTRATE 600+D PO) Take 1 tablet by mouth daily.       Marland Kitchen diltiazem (CARDIZEM CD) 300 MG 24 hr capsule Take 1 capsule (300 mg total) by mouth daily.    . fish oil-omega-3 fatty acids 1000 MG capsule Take 1-2 g by mouth daily.     .      . lansoprazole (PREVACID) 30 MG capsule Take 30 mg by mouth at bedtime.     . mirabegron ER (MYRBETRIQ) 25 MG TB24 Take 1 tablet (25 mg total) by mouth daily.    . naproxen (NAPROSYN) 375 MG tablet Take 375 mg by mouth 2 (two) times daily with a meal.      . NON FORMULARY Stress Tab  One tablet daily      .  potassium chloride (K-DUR,KLOR-CON) 10 MEQ tablet Take 10 mEq by mouth daily.      . pravastatin (PRAVACHOL) 40 MG tablet Take 40 mg by mouth daily.       . temazepam (RESTORIL) 30 MG capsule Take 30 mg by mouth at bedtime.  EVERY NIGHT        Review of Systems     Objective:   Physical Exam  Vitals reviewed. Constitutional: She is oriented to person, place, and time. She appears well-nourished. No distress.  HENT:  Head: Normocephalic and atraumatic.  Mouth/Throat: Oropharynx is clear and moist. No oropharyngeal exudate.  Eyes: Pupils are equal, round, and reactive to light. No scleral icterus.  Neck: Normal range of motion. Neck supple.  Cardiovascular: Normal rate, regular rhythm and normal heart sounds.   Pulmonary/Chest: Effort normal and breath sounds normal. No respiratory distress.  Abdominal: Soft. Bowel sounds are normal. She exhibits no distension. There is no tenderness.  Musculoskeletal: She exhibits no edema.  Lymphadenopathy:    She has no cervical adenopathy.  Neurological: She is alert and oriented to person, place, and time.  NO FOCAL DEFICITS   Psychiatric: She has a normal mood and  affect.          Assessment & Plan:

## 2013-04-09 NOTE — Assessment & Plan Note (Signed)
RESOLVED WITH INCREASING WATER/FIBER AND MORE FRQUENT BMs    FOLLOW A LOW FAT/HIGH FIBER DIET.  DRINK WATER TO KEEP YOUR URINE LIGHT YELLOW.  LOSE 10 MORE LBS.  CONTINUE WITH EXERCISE PLAN.  FOLLOW UP IN 6 MOS.

## 2013-04-11 NOTE — Progress Notes (Signed)
Reminder in epic °

## 2013-04-16 NOTE — Progress Notes (Signed)
Cc PCP 

## 2013-05-23 ENCOUNTER — Ambulatory Visit: Payer: Medicare Other | Admitting: Family Medicine

## 2013-06-24 ENCOUNTER — Other Ambulatory Visit: Payer: Self-pay | Admitting: Family Medicine

## 2013-06-30 ENCOUNTER — Ambulatory Visit: Payer: Medicare Other | Admitting: Obstetrics & Gynecology

## 2013-07-01 ENCOUNTER — Other Ambulatory Visit: Payer: Self-pay | Admitting: Family Medicine

## 2013-07-01 NOTE — Telephone Encounter (Signed)
May refill x4 

## 2013-07-08 ENCOUNTER — Ambulatory Visit (INDEPENDENT_AMBULATORY_CARE_PROVIDER_SITE_OTHER): Payer: Medicare Other | Admitting: *Deleted

## 2013-07-08 DIAGNOSIS — Z23 Encounter for immunization: Secondary | ICD-10-CM | POA: Diagnosis not present

## 2013-07-29 DIAGNOSIS — H52229 Regular astigmatism, unspecified eye: Secondary | ICD-10-CM | POA: Diagnosis not present

## 2013-07-29 DIAGNOSIS — H5200 Hypermetropia, unspecified eye: Secondary | ICD-10-CM | POA: Diagnosis not present

## 2013-07-29 DIAGNOSIS — H04129 Dry eye syndrome of unspecified lacrimal gland: Secondary | ICD-10-CM | POA: Diagnosis not present

## 2013-07-29 DIAGNOSIS — Z961 Presence of intraocular lens: Secondary | ICD-10-CM | POA: Diagnosis not present

## 2013-07-30 ENCOUNTER — Other Ambulatory Visit: Payer: Self-pay | Admitting: Family Medicine

## 2013-09-15 ENCOUNTER — Telehealth: Payer: Self-pay | Admitting: *Deleted

## 2013-09-15 NOTE — Telephone Encounter (Signed)
Pt needs a OV follow up with [facility] only. Pt wants appointment in February with [facility]

## 2013-09-16 NOTE — Telephone Encounter (Signed)
Pt was put on Feb recall waiting list for [facility] once available I will contact patient

## 2013-09-26 ENCOUNTER — Other Ambulatory Visit: Payer: Self-pay | Admitting: Family Medicine

## 2013-09-26 ENCOUNTER — Encounter: Payer: Self-pay | Admitting: Family Medicine

## 2013-09-26 ENCOUNTER — Ambulatory Visit (INDEPENDENT_AMBULATORY_CARE_PROVIDER_SITE_OTHER): Payer: Medicare Other | Admitting: Family Medicine

## 2013-09-26 VITALS — BP 148/72 | Ht 66.0 in | Wt 188.0 lb

## 2013-09-26 DIAGNOSIS — I10 Essential (primary) hypertension: Secondary | ICD-10-CM | POA: Diagnosis not present

## 2013-09-26 DIAGNOSIS — M6740 Ganglion, unspecified site: Secondary | ICD-10-CM | POA: Insufficient documentation

## 2013-09-26 DIAGNOSIS — R5383 Other fatigue: Secondary | ICD-10-CM | POA: Diagnosis not present

## 2013-09-26 DIAGNOSIS — R5381 Other malaise: Secondary | ICD-10-CM | POA: Diagnosis not present

## 2013-09-26 DIAGNOSIS — Z79899 Other long term (current) drug therapy: Secondary | ICD-10-CM

## 2013-09-26 DIAGNOSIS — E785 Hyperlipidemia, unspecified: Secondary | ICD-10-CM | POA: Diagnosis not present

## 2013-09-26 DIAGNOSIS — R7309 Other abnormal glucose: Secondary | ICD-10-CM | POA: Diagnosis not present

## 2013-09-26 DIAGNOSIS — R739 Hyperglycemia, unspecified: Secondary | ICD-10-CM

## 2013-09-26 MED ORDER — TRIAMTERENE-HCTZ 37.5-25 MG PO CAPS: 1.0000 | ORAL_CAPSULE | Freq: Every day | ORAL | Status: DC

## 2013-09-26 NOTE — Progress Notes (Signed)
   Subjective:    Patient ID: Melissa Barajas, female    DOB: 1941/06/08, 73 y.o.   MRN: [id number]  HPI Patient is here today for a check up.  She would like blood work done and her meds refilled. It is time for her to have her medications refilled in her lab work done. She denies any chest tightness pressure pain nausea vomiting diarrhea. She does try to eat a healthy diet as best as possible although she states over the past month with the holidays she did eat much more than normal.  Pt also c/o a cyst that is on her right thumb. She first noticed it in Nov. She states it does not cause pain. She is concerned about this. She is worried about it might be cancer. She states it does not cause her any pain or discomfort.    Review of Systems  Constitutional: Negative for activity change, appetite change and fatigue.  HENT: Negative for congestion, ear discharge and rhinorrhea.   Eyes: Negative for discharge.  Respiratory: Negative for cough, chest tightness and wheezing.   Cardiovascular: Negative for chest pain.  Gastrointestinal: Negative for vomiting and abdominal pain.  Genitourinary: Negative for frequency and difficulty urinating.  Musculoskeletal: Positive for arthralgias, back pain and joint swelling. Negative for neck pain.  Allergic/Immunologic: Negative for environmental allergies and food allergies.  Neurological: Negative for weakness and headaches.  Psychiatric/Behavioral: Negative for behavioral problems and agitation.       Objective:   Physical Exam  Constitutional: She is oriented to person, place, and time. She appears well-developed and well-nourished.  HENT:  Head: Normocephalic.  Right Ear: External ear normal.  Left Ear: External ear normal.  Eyes: Pupils are equal, round, and reactive to light.  Neck: Normal range of motion. No thyromegaly present.  Cardiovascular: Normal rate, regular rhythm, normal heart sounds and intact distal pulses.   No murmur  heard. Pulmonary/Chest: Effort normal and breath sounds normal. No respiratory distress. She has no wheezes.  Abdominal: Soft. Bowel sounds are normal. She exhibits no distension and no mass. There is no tenderness.  Musculoskeletal: She exhibits tenderness. She exhibits no edema.  Lymphadenopathy:    She has no cervical adenopathy.  Neurological: She is alert and oriented to person, place, and time. She exhibits normal muscle tone.  Skin: Skin is warm and dry.  Psychiatric: She has a normal mood and affect. Her behavior is normal.   Her lungs are clear hearts regular pulse normal blood pressure is elevated systolic is higher at 409/81 extremities no edema skin warm dry she does have subjective discomfort in her back hips and knees. There is no edema. Neurologic grossly normal.  She does have ganglion cyst in the right thumb region     Assessment & Plan:  #1 HTN-I. am not comfortable with her systolic blood pressure I encouraged her heart healthy diet staying physically active as well. I am switching her medication to Dyazide 1 daily. We'll recheck labs in a couple weeks' time  #2 hyperlipidemia she was encouraged with a low fried food diet low bad fat diet continue pravastatin check lab work in a couple weeks' time  #3 mild back and knee discomfort Naprosyn as necessary. Monitor kidney functions   #4 nsomnia Restoril helps may continue this.  #5 ganglion cyst right thumb if he gets worse or referral to orthopedics

## 2013-09-29 ENCOUNTER — Other Ambulatory Visit: Payer: Self-pay | Admitting: Family Medicine

## 2013-10-15 ENCOUNTER — Encounter: Payer: Self-pay | Admitting: Gastroenterology

## 2013-10-15 ENCOUNTER — Ambulatory Visit (INDEPENDENT_AMBULATORY_CARE_PROVIDER_SITE_OTHER): Payer: Medicare Other | Admitting: Gastroenterology

## 2013-10-15 VITALS — BP 136/77 | HR 81 | Temp 98.0°F | Wt 184.6 lb

## 2013-10-15 DIAGNOSIS — R1011 Right upper quadrant pain: Secondary | ICD-10-CM | POA: Diagnosis not present

## 2013-10-15 DIAGNOSIS — K219 Gastro-esophageal reflux disease without esophagitis: Secondary | ICD-10-CM

## 2013-10-15 NOTE — Assessment & Plan Note (Signed)
SX IMPROVED AFTER MORE REGULAR BMs.  DRINK WATER EAT FIBER OPV IN 6 MOS

## 2013-10-15 NOTE — Assessment & Plan Note (Signed)
SX CONTROLLED WITH QHS PREVACID.  LOW FAT DIET CONTINUE YOUR WEIGHT LOSS EFFORTS. CONTINUE PREVACID.  OPV IN 6 MOS

## 2013-10-15 NOTE — Progress Notes (Signed)
Reminder in epic °

## 2013-10-15 NOTE — Progress Notes (Signed)
   Subjective:    Patient ID: Melissa Barajas, female    DOB: 02-Mar-1941, 73 y.o.   MRN: [id number]  Sallee Lange, MD  HPI DOING WELL. BMs: NOW THAT SHE'S JUICING-3X/WEEK. HEARTBURN: BETTER WITH REFLUX MEDS. HUSBAND SICK ALL OF JAN. DIDN'T NEED COUNCIL ON AGING. PT DENIES FEVER, CHILLS, BRBPR, nausea, vomiting, melena, diarrhea, abd pain, problems swallowing, OR heartburn or indigestion.   Past Medical History  Diagnosis Date  . Hypertension   . GERD (gastroesophageal reflux disease)   . Hypercholesteremia   . Atrial fibrillation   . Arthritis   . Lipoma of arm     Left    Past Surgical History  Procedure Laterality Date  . 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 Thyroidectomy    . Cystectomy      Left breast  . Tumor   05/2010    Left breast under nipple  . Esophagogastroduodenoscopy      [id number] appearing Schatzki's ring, possible cervical esophageal web, both dilated with passage of a Maloney dilator/small hiatal hernia  . Abdominal hysterectomy      Partial  . Colonoscopy     Allergies  Allergen Reactions  . Hydrocodone Nausea Only  . Lisinopril     Angio edema  . Vioxx [Rofecoxib]     Current Outpatient Prescriptions  Medication Sig Dispense Refill  . Calcium Carbonate-Vitamin D (CALTRATE 600+D PO) Take 1 tablet by mouth daily.       Marland Kitchen CARTIA XT 300 MG 24 hr capsule TAKE 1 CAPSULE BY MOUTH DAILY    . fish oil-omega-3 fatty acids 1000 MG capsule Take 1-2 g by mouth daily.     . lansoprazole (PREVACID) 30 MG capsule Take 30 mg by mouth at bedtime.     . mirabegron ER (MYRBETRIQ) 25 MG TB24 Take 1 tablet (25 mg total) by mouth daily.    . naproxen (NAPROSYN) 375 MG tablet TAKE 1 TABLET BY MOUTH TWICE DAILY. DISCONTINUE DICLOFENAC    . NON FORMULARY Stress Tab  One tablet daily    . pravastatin (PRAVACHOL) 40 MG tablet Take 40 mg by mouth daily.     . temazepam (RESTORIL) 30 MG capsule TAKE 1 CAPSULE BY MOUTH EVERY NIGHT AT BEDTIME    .  triamterene-hydrochlorothiazide (DYAZIDE) 37.5-25 MG per capsule Take 1 each (1 capsule total) by mouth daily.        Review of Systems     Objective:   Physical Exam  Vitals reviewed. Constitutional: She is oriented to person, place, and time. She appears well-nourished. No distress.  HENT:  Head: Normocephalic and atraumatic.  Mouth/Throat: Oropharynx is clear and moist. No oropharyngeal exudate.  Eyes: Pupils are equal, round, and reactive to light. No scleral icterus.  Cardiovascular: Normal rate, regular rhythm and normal heart sounds.   Pulmonary/Chest: Effort normal and breath sounds normal. No respiratory distress.  Abdominal: Soft. Bowel sounds are normal. She exhibits no distension. There is no tenderness.  Musculoskeletal: She exhibits no edema.  Neurological: She is alert and oriented to person, place, and time.  NO FOCAL DEFICITS   Psychiatric: She has a normal mood and affect.          Assessment & Plan:

## 2013-10-15 NOTE — Patient Instructions (Signed)
FOLLOW A HIGH FIBER/LOW FAT DIET. SEE INFO BELOW.  CONTINUE YOUR WEIGHT LOSS EFFORTS.  CONTINUE PREVACID.   FOLLOW UP IN 6 MOS.    Low-Fat Diet BREADS, CEREALS, PASTA, RICE, DRIED PEAS, AND BEANS These products are high in carbohydrates and most are low in fat. Therefore, they can be increased in the diet as substitutes for fatty foods. They too, however, contain calories and should not be eaten in excess. Cereals can be eaten for snacks as well as for breakfast.   FRUITS AND VEGETABLES It is good to eat fruits and vegetables. Besides being sources of fiber, both are rich in vitamins and some minerals. They help you get the daily allowances of these nutrients. Fruits and vegetables can be used for snacks and desserts.  MEATS Limit lean meat, chicken, [place], and fish to no more than 6 ounces per day. Beef, Pork, and Lamb Use lean cuts of beef, pork, and lamb. Lean cuts include:  Extra-lean ground beef.  Arm roast.  Sirloin tip.  Center-cut ham.  Round steak.  Loin chops.  Rump roast.  Tenderloin.  Trim all fat off the outside of meats before cooking. It is not necessary to severely decrease the intake of red meat, but lean choices should be made. Lean meat is rich in protein and contains a highly absorbable form of iron. Premenopausal women, in particular, should avoid reducing lean red meat because this could increase the risk for low red blood cells (iron-deficiency anemia).  Chicken and [place] These are good sources of protein. The fat of poultry can be reduced by removing the skin and underlying fat layers before cooking. Chicken and [place] can be substituted for lean red meat in the diet. Poultry should not be fried or covered with high-fat sauces. Fish and Shellfish Fish is a good source of protein. Shellfish contain cholesterol, but they usually are low in saturated fatty acids. The preparation of fish is important. Like chicken and [place], they should not be fried or covered  with high-fat sauces. EGGS Egg whites contain no fat or cholesterol. They can be eaten often. Try 1 to 2 egg whites instead of whole eggs in recipes or use egg substitutes that do not contain yolk. MILK AND DAIRY PRODUCTS Use skim or 1% milk instead of 2% or whole milk. Decrease whole milk, natural, and processed cheeses. Use nonfat or low-fat (2%) cottage cheese or low-fat cheeses made from vegetable oils. Choose nonfat or low-fat (1 to 2%) yogurt. Experiment with evaporated skim milk in recipes that call for heavy cream. Substitute low-fat yogurt or low-fat cottage cheese for sour cream in dips and salad dressings. Have at least 2 servings of low-fat dairy products, such as 2 glasses of skim (or 1%) milk each day to help get your daily calcium intake. FATS AND OILS Reduce the total intake of fats, especially saturated fat. Butterfat, lard, and beef fats are high in saturated fat and cholesterol. These should be avoided as much as possible. Vegetable fats do not contain cholesterol, but certain vegetable fats, such as coconut oil, palm oil, and palm kernel oil are very high in saturated fats. These should be limited. These fats are often used in bakery goods, processed foods, popcorn, oils, and nondairy creamers. Vegetable shortenings and some peanut butters contain hydrogenated oils, which are also saturated fats. Read the labels on these foods and check for saturated vegetable oils. Unsaturated vegetable oils and fats do not raise blood cholesterol. However, they should be limited because they are  fats and are high in calories. Total fat should still be limited to 30% of your daily caloric intake. Desirable liquid vegetable oils are corn oil, cottonseed oil, olive oil, canola oil, safflower oil, soybean oil, and sunflower oil. Peanut oil is not as good, but small amounts are acceptable. Buy a heart-healthy tub margarine that has no partially hydrogenated oils in the ingredients. Mayonnaise and salad  dressings often are made from unsaturated fats, but they should also be limited because of their high calorie and fat content. Seeds, nuts, peanut butter, olives, and avocados are high in fat, but the fat is mainly the unsaturated type. These foods should be limited mainly to avoid excess calories and fat. OTHER EATING TIPS Snacks  Most sweets should be limited as snacks. They tend to be rich in calories and fats, and their caloric content outweighs their nutritional value. Some good choices in snacks are graham crackers, melba toast, soda crackers, bagels (no egg), English muffins, fruits, and vegetables. These snacks are preferable to snack crackers, [place] fries, TORTILLA CHIPS, and POTATO chips. Popcorn should be air-popped or cooked in small amounts of liquid vegetable oil. Desserts Eat fruit, low-fat yogurt, and fruit ices instead of pastries, cake, and cookies. Sherbet, angel food cake, gelatin dessert, frozen low-fat yogurt, or other frozen products that do not contain saturated fat (pure fruit juice bars, frozen ice pops) are also acceptable.  COOKING METHODS Choose those methods that use little or no fat. They include: Poaching.  Braising.  Steaming.  Grilling.  Baking.  Stir-frying.  Broiling.  Microwaving.  Foods can be cooked in a nonstick pan without added fat, or use a nonfat cooking spray in regular cookware. Limit fried foods and avoid frying in saturated fat. Add moisture to lean meats by using water, broth, cooking wines, and other nonfat or low-fat sauces along with the cooking methods mentioned above. Soups and stews should be chilled after cooking. The fat that forms on top after a few hours in the refrigerator should be skimmed off. When preparing meals, avoid using excess salt. Salt can contribute to raising blood pressure in some people.  EATING AWAY FROM HOME Order entres, potatoes, and vegetables without sauces or butter. When meat exceeds the size of a deck of cards  (3 to 4 ounces), the rest can be taken home for another meal. Choose vegetable or fruit salads and ask for low-calorie salad dressings to be served on the side. Use dressings sparingly. Limit high-fat toppings, such as bacon, crumbled eggs, cheese, sunflower seeds, and olives. Ask for heart-healthy tub margarine instead of butter.  High-Fiber Diet A high-fiber diet changes your normal diet to include more whole grains, legumes, fruits, and vegetables. Changes in the diet involve replacing refined carbohydrates with unrefined foods. The calorie level of the diet is essentially unchanged. The Dietary Reference Intake (recommended amount) for adult males is 38 grams per day. For adult females, it is 25 grams per day. Pregnant and lactating women should consume 28 grams of fiber per day. Fiber is the intact part of a plant that is not broken down during digestion. Functional fiber is fiber that has been isolated from the plant to provide a beneficial effect in the body. PURPOSE  Increase stool bulk.   Ease and regulate bowel movements.   Lower cholesterol.  INDICATIONS THAT YOU NEED MORE FIBER  Constipation and hemorrhoids.   Uncomplicated diverticulosis (intestine condition) and irritable bowel syndrome.   Weight management.   As a protective measure against hardening  of the arteries (atherosclerosis), diabetes, and cancer.   GUIDELINES FOR INCREASING FIBER IN THE DIET  Start adding fiber to the diet slowly. A gradual increase of about 5 more grams (2 slices of whole-wheat bread, 2 servings of most fruits or vegetables, or 1 bowl of high-fiber cereal) per day is best. Too rapid an increase in fiber may result in constipation, flatulence, and bloating.   Drink enough water and fluids to keep your urine clear or pale yellow. Water, juice, or caffeine-free drinks are recommended. Not drinking enough fluid may cause constipation.   Eat a variety of high-fiber foods rather than one type of  fiber.   Try to increase your intake of fiber through using high-fiber foods rather than fiber pills or supplements that contain small amounts of fiber.   The goal is to change the types of food eaten. Do not supplement your present diet with high-fiber foods, but replace foods in your present diet.  INCLUDE A VARIETY OF FIBER SOURCES  Replace refined and processed grains with whole grains, canned fruits with fresh fruits, and incorporate other fiber sources. White rice, white breads, and most bakery goods contain little or no fiber.   Brown whole-grain rice, buckwheat oats, and many fruits and vegetables are all good sources of fiber. These include: broccoli, Brussels sprouts, cabbage, cauliflower, beets, sweet potatoes, white potatoes (skin on), carrots, tomatoes, eggplant, squash, berries, fresh fruits, and dried fruits.   Cereals appear to be the richest source of fiber. Cereal fiber is found in whole grains and bran. Bran is the fiber-rich outer coat of cereal grain, which is largely removed in refining. In whole-grain cereals, the bran remains. In breakfast cereals, the largest amount of fiber is found in those with "bran" in their names. The fiber content is sometimes indicated on the label.   You may need to include additional fruits and vegetables each day.   In baking, for 1 cup white flour, you may use the following substitutions:   1 cup whole-wheat flour minus 2 tablespoons.   1/2 cup white flour plus 1/2 cup whole-wheat flour.

## 2013-10-16 NOTE — Progress Notes (Signed)
cc'd to pcp 

## 2013-10-23 DIAGNOSIS — R7309 Other abnormal glucose: Secondary | ICD-10-CM | POA: Diagnosis not present

## 2013-10-23 DIAGNOSIS — E785 Hyperlipidemia, unspecified: Secondary | ICD-10-CM | POA: Diagnosis not present

## 2013-10-23 DIAGNOSIS — Z79899 Other long term (current) drug therapy: Secondary | ICD-10-CM | POA: Diagnosis not present

## 2013-10-23 DIAGNOSIS — R5383 Other fatigue: Secondary | ICD-10-CM | POA: Diagnosis not present

## 2013-10-23 DIAGNOSIS — I10 Essential (primary) hypertension: Secondary | ICD-10-CM | POA: Diagnosis not present

## 2013-10-23 DIAGNOSIS — R5381 Other malaise: Secondary | ICD-10-CM | POA: Diagnosis not present

## 2013-10-23 LAB — HEPATIC FUNCTION PANEL
ALT: 11 U/L (ref 0–35)
AST: 18 U/L (ref 0–37)
Albumin: 4.1 g/dL (ref 3.5–5.2)
Alkaline Phosphatase: 75 U/L (ref 39–117)
BILIRUBIN DIRECT: 0.1 mg/dL (ref 0.0–0.3)
BILIRUBIN INDIRECT: 0.5 mg/dL (ref 0.2–1.2)
TOTAL PROTEIN: 7.2 g/dL (ref 6.0–8.3)
Total Bilirubin: 0.6 mg/dL (ref 0.2–1.2)

## 2013-10-23 LAB — BASIC METABOLIC PANEL
BUN: 12 mg/dL (ref 6–23)
CALCIUM: 9.8 mg/dL (ref 8.4–10.5)
CO2: 34 meq/L — AB (ref 19–32)
CREATININE: 0.77 mg/dL (ref 0.50–1.10)
Chloride: 99 mEq/L (ref 96–112)
GLUCOSE: 94 mg/dL (ref 70–99)
Potassium: 3.8 mEq/L (ref 3.5–5.3)
Sodium: 141 mEq/L (ref 135–145)

## 2013-10-23 LAB — HEMOGLOBIN A1C
Hgb A1c MFr Bld: 5.9 % — ABNORMAL HIGH (ref <5.7)
Mean Plasma Glucose: 123 mg/dL — ABNORMAL HIGH (ref <117)

## 2013-10-23 LAB — LIPID PANEL
Cholesterol: 143 mg/dL (ref 0–200)
HDL: 37 mg/dL — ABNORMAL LOW (ref >39)
LDL CALC: 90 mg/dL (ref 0–99)
TRIGLYCERIDES: 82 mg/dL (ref <150)
Total CHOL/HDL Ratio: 3.9 Ratio
VLDL: 16 mg/dL (ref 0–40)

## 2013-10-24 LAB — TSH: TSH: 0.845 u[IU]/mL (ref 0.350–4.500)

## 2013-10-27 ENCOUNTER — Encounter: Payer: Self-pay | Admitting: Family Medicine

## 2013-11-12 DIAGNOSIS — N63 Unspecified lump in unspecified breast: Secondary | ICD-10-CM | POA: Diagnosis not present

## 2013-11-12 DIAGNOSIS — R928 Other abnormal and inconclusive findings on diagnostic imaging of breast: Secondary | ICD-10-CM | POA: Diagnosis not present

## 2013-12-02 DIAGNOSIS — M5137 Other intervertebral disc degeneration, lumbosacral region: Secondary | ICD-10-CM | POA: Diagnosis not present

## 2013-12-02 DIAGNOSIS — M999 Biomechanical lesion, unspecified: Secondary | ICD-10-CM | POA: Diagnosis not present

## 2013-12-03 DIAGNOSIS — M5137 Other intervertebral disc degeneration, lumbosacral region: Secondary | ICD-10-CM | POA: Diagnosis not present

## 2013-12-03 DIAGNOSIS — M999 Biomechanical lesion, unspecified: Secondary | ICD-10-CM | POA: Diagnosis not present

## 2013-12-04 DIAGNOSIS — M5137 Other intervertebral disc degeneration, lumbosacral region: Secondary | ICD-10-CM | POA: Diagnosis not present

## 2013-12-04 DIAGNOSIS — M999 Biomechanical lesion, unspecified: Secondary | ICD-10-CM | POA: Diagnosis not present

## 2013-12-08 DIAGNOSIS — M5137 Other intervertebral disc degeneration, lumbosacral region: Secondary | ICD-10-CM | POA: Diagnosis not present

## 2013-12-08 DIAGNOSIS — M999 Biomechanical lesion, unspecified: Secondary | ICD-10-CM | POA: Diagnosis not present

## 2013-12-09 DIAGNOSIS — M5137 Other intervertebral disc degeneration, lumbosacral region: Secondary | ICD-10-CM | POA: Diagnosis not present

## 2013-12-09 DIAGNOSIS — M999 Biomechanical lesion, unspecified: Secondary | ICD-10-CM | POA: Diagnosis not present

## 2013-12-10 DIAGNOSIS — M5137 Other intervertebral disc degeneration, lumbosacral region: Secondary | ICD-10-CM | POA: Diagnosis not present

## 2013-12-10 DIAGNOSIS — M999 Biomechanical lesion, unspecified: Secondary | ICD-10-CM | POA: Diagnosis not present

## 2013-12-11 DIAGNOSIS — M999 Biomechanical lesion, unspecified: Secondary | ICD-10-CM | POA: Diagnosis not present

## 2013-12-11 DIAGNOSIS — M5137 Other intervertebral disc degeneration, lumbosacral region: Secondary | ICD-10-CM | POA: Diagnosis not present

## 2013-12-15 DIAGNOSIS — M5137 Other intervertebral disc degeneration, lumbosacral region: Secondary | ICD-10-CM | POA: Diagnosis not present

## 2013-12-15 DIAGNOSIS — M999 Biomechanical lesion, unspecified: Secondary | ICD-10-CM | POA: Diagnosis not present

## 2013-12-17 DIAGNOSIS — M5137 Other intervertebral disc degeneration, lumbosacral region: Secondary | ICD-10-CM | POA: Diagnosis not present

## 2013-12-17 DIAGNOSIS — M999 Biomechanical lesion, unspecified: Secondary | ICD-10-CM | POA: Diagnosis not present

## 2013-12-24 ENCOUNTER — Other Ambulatory Visit: Payer: Self-pay | Admitting: Family Medicine

## 2013-12-24 ENCOUNTER — Encounter: Payer: Self-pay | Admitting: Family Medicine

## 2013-12-25 ENCOUNTER — Ambulatory Visit: Payer: Medicare Other | Admitting: Family Medicine

## 2014-01-30 ENCOUNTER — Other Ambulatory Visit: Payer: Self-pay | Admitting: Family Medicine

## 2014-01-30 NOTE — Telephone Encounter (Signed)
Seen 1/16. I see pt is on pravastatin 40 mg. Take one tablet daily, not 80mg 

## 2014-01-30 NOTE — Telephone Encounter (Signed)
I would recommend calling the patient clarified with her whether she is on 40 or 80 mg and then ordering it. Patient does need followup somewhere in June

## 2014-03-23 ENCOUNTER — Other Ambulatory Visit: Payer: Self-pay | Admitting: Family Medicine

## 2014-03-30 ENCOUNTER — Other Ambulatory Visit: Payer: Self-pay | Admitting: Family Medicine

## 2014-03-31 NOTE — Telephone Encounter (Signed)
May refill this +2 refills needs office visit by fall time

## 2014-04-20 ENCOUNTER — Encounter: Payer: Self-pay | Admitting: Gastroenterology

## 2014-04-23 DIAGNOSIS — N63 Unspecified lump in unspecified breast: Secondary | ICD-10-CM | POA: Diagnosis not present

## 2014-04-29 ENCOUNTER — Other Ambulatory Visit: Payer: Self-pay | Admitting: Family Medicine

## 2014-05-13 ENCOUNTER — Other Ambulatory Visit: Payer: Self-pay | Admitting: Family Medicine

## 2014-05-14 ENCOUNTER — Encounter: Payer: Self-pay | Admitting: Family Medicine

## 2014-05-25 ENCOUNTER — Ambulatory Visit (INDEPENDENT_AMBULATORY_CARE_PROVIDER_SITE_OTHER): Payer: Medicare Other

## 2014-05-25 DIAGNOSIS — Z23 Encounter for immunization: Secondary | ICD-10-CM

## 2014-05-28 ENCOUNTER — Ambulatory Visit: Payer: Medicare Other | Admitting: Gastroenterology

## 2014-08-08 ENCOUNTER — Other Ambulatory Visit: Payer: Self-pay | Admitting: Family Medicine

## 2014-08-11 ENCOUNTER — Other Ambulatory Visit: Payer: Self-pay | Admitting: Family Medicine

## 2014-08-11 DIAGNOSIS — H524 Presbyopia: Secondary | ICD-10-CM | POA: Diagnosis not present

## 2014-08-11 DIAGNOSIS — Z961 Presence of intraocular lens: Secondary | ICD-10-CM | POA: Diagnosis not present

## 2014-08-11 DIAGNOSIS — H52223 Regular astigmatism, bilateral: Secondary | ICD-10-CM | POA: Diagnosis not present

## 2014-08-11 NOTE — Telephone Encounter (Signed)
2 refills needs OV Jan 2016!!

## 2014-08-11 NOTE — Telephone Encounter (Signed)
Last seen 11.6.15.

## 2014-09-28 ENCOUNTER — Other Ambulatory Visit: Payer: Self-pay | Admitting: Family Medicine

## 2014-10-08 ENCOUNTER — Ambulatory Visit: Payer: Medicare Other | Admitting: Family Medicine

## 2014-10-12 ENCOUNTER — Ambulatory Visit (INDEPENDENT_AMBULATORY_CARE_PROVIDER_SITE_OTHER): Payer: Medicare Other | Admitting: Family Medicine

## 2014-10-12 ENCOUNTER — Encounter: Payer: Self-pay | Admitting: Family Medicine

## 2014-10-12 VITALS — BP 150/78 | Ht 66.0 in | Wt 188.4 lb

## 2014-10-12 DIAGNOSIS — Z79899 Other long term (current) drug therapy: Secondary | ICD-10-CM | POA: Diagnosis not present

## 2014-10-12 DIAGNOSIS — Z23 Encounter for immunization: Secondary | ICD-10-CM | POA: Diagnosis not present

## 2014-10-12 DIAGNOSIS — E785 Hyperlipidemia, unspecified: Secondary | ICD-10-CM | POA: Diagnosis not present

## 2014-10-12 DIAGNOSIS — R739 Hyperglycemia, unspecified: Secondary | ICD-10-CM | POA: Diagnosis not present

## 2014-10-12 DIAGNOSIS — G4700 Insomnia, unspecified: Secondary | ICD-10-CM | POA: Diagnosis not present

## 2014-10-12 DIAGNOSIS — I10 Essential (primary) hypertension: Secondary | ICD-10-CM

## 2014-10-12 NOTE — Progress Notes (Signed)
   Subjective:    Patient ID: Melissa Barajas, female    DOB: [birth year], 74 y.o.   MRN: [id number]  Hypertension This is a chronic problem. The current episode started more than 1 year ago. The problem has been gradually improving since onset. The problem is controlled. There are no associated agents to hypertension. There are no known risk factors for coronary artery disease. Treatments tried: triamterene-hctz. The current treatment provides significant improvement. There are no compliance problems.    Patient states that she has no concerns at this time.  Patient under a fair amount of stress because her husband dying of cancer We did discuss her cholesterol her moods blood pressure and insomnia. Review of Systems Negative for chest pain shortness breath nausea vomiting diarrhea some mild epigastric pain    Objective:   Physical Exam Neck no masses lungs clear no crackles risk for rate normal heart is regular pulses normal abdomen soft extremities no edema skin warm dry       Assessment & Plan:  Arthralgias left hip and left ankle persistent for some time anti-inflammatory as needed if worse further testing Patient was encouraged to discuss her abdominal pain with Dr. Oneida Alar when she sees her later this week HTN good control continue current measures lab work ordered Hyperlipidemia check lab work continue current measures Bone density ordered today Patient under a lot of stress with her husband dying from cancer patient denies depression currently

## 2014-10-15 ENCOUNTER — Ambulatory Visit (INDEPENDENT_AMBULATORY_CARE_PROVIDER_SITE_OTHER): Payer: Medicare Other | Admitting: Gastroenterology

## 2014-10-15 ENCOUNTER — Encounter: Payer: Self-pay | Admitting: Gastroenterology

## 2014-10-15 VITALS — BP 135/70 | HR 69 | Temp 97.5°F | Ht 66.0 in | Wt 191.6 lb

## 2014-10-15 DIAGNOSIS — K30 Functional dyspepsia: Secondary | ICD-10-CM

## 2014-10-15 MED ORDER — PROMETHAZINE HCL 12.5 MG PO TABS: ORAL_TABLET | ORAL | Status: DC

## 2014-10-15 NOTE — Progress Notes (Signed)
Subjective:    Patient ID: Melissa Barajas, female    DOB: 1941/08/19, 74 y.o.   MRN: [id number]  Sallee Lange, MD  HPI HAVING NAUSEA AND WANTS TO KNOW IF SHE CAN CATCH CANCER FROM HER HUSBAND. WAS DOING GOOD THEN FELL OFF. GAINED 8 LBS SINCE FEB 2015. NAUSEA: 1-2X/WEEK. COMES IN WAVES AND MAY BE RELIEVED BY SALT. CAN BE ASSOCIATED WITH RUQ/EEPIGASTRIC ABDOMINAL PAIN. NO TROUBLE WITH BOWELS IF SHE EATS RAISIN BRAN.   PT DENIES FEVER, CHILLS, HEMATOCHEZIA, vomiting, melena, diarrhea, CHEST PAIN, SHORTNESS OF BREATH, constipation, problems swallowing, OR heartburn or indigestion.   Past Medical History  Diagnosis Date  . Hypertension   . GERD (gastroesophageal reflux disease)   . Hypercholesteremia   . Atrial fibrillation   . Arthritis   . Lipoma of arm     Left   Past Surgical History  Procedure Laterality Date  . 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 Thyroidectomy    . Cystectomy      Left breast  . Tumor   05/2010    Left breast under nipple  . Esophagogastroduodenoscopy      TIR:[id number] appearing Schatzki's ring, possible cervical esophageal web, both dilated with passage of a Maloney dilator/small hiatal hernia  . Abdominal hysterectomy      Partial  . Colonoscopy     Allergies  Allergen Reactions  . Hydrocodone Nausea Only  . Lisinopril     Angio edema  . Vioxx [Rofecoxib]     Current Outpatient Prescriptions  Medication Sig Dispense Refill  . Calcium Carbonate-Vitamin D (CALTRATE 600+D PO) Take 1 tablet by mouth daily.     Marland Kitchen CARTIA XT 300 MG 24 hr capsule TAKE 1 CAPSULE BY MOUTH EVERY DAY    . fish oil-omega-3 fatty acids 1000 MG capsule Take 1-2 g by mouth daily.     . lansoprazole (PREVACID) 30 MG capsule Take 30 mg by mouth at bedtime.     . naproxen (NAPROSYN) 375 MG tablet TAKE 1 TABLET BY MOUTH TWICE DAILY. DISCONTINUE DICLOFENAC    . NON FORMULARY Stress Tab  One tablet daily    . pravastatin (PRAVACHOL) 80 MG tablet TAKE 1  TABLET BY MOUTH EVERY DAY    . temazepam (RESTORIL) 30 MG capsule TAKE 1 CAPSULE BY MOUTH EVERY NIGHT AT BEDTIME    . triamterene-hydrochlorothiazide (DYAZIDE) 37.5-25 MG per capsule TAKE 1 CAPSULE BY MOUTH EVERY DAY     Review of Systems     Objective:   Physical Exam  Constitutional: She is oriented to person, place, and time. She appears well-developed and well-nourished. No distress.  HENT:  Head: Normocephalic and atraumatic.  Mouth/Throat: Oropharynx is clear and moist. No oropharyngeal exudate.  Eyes: Pupils are equal, round, and reactive to light. No scleral icterus.  Neck: Normal range of motion. Neck supple.  Cardiovascular: Normal rate, regular rhythm and normal heart sounds.   Pulmonary/Chest: Effort normal and breath sounds normal. No respiratory distress.  Abdominal: Soft. Bowel sounds are normal. She exhibits no distension. There is tenderness. There is no rebound and no guarding.  MILD TTP IN THE EPIGASTRIUM, MILD PERI-UMBILICAL TTP  Musculoskeletal: She exhibits no edema.  Lymphadenopathy:    She has no cervical adenopathy.  Neurological: She is alert and oriented to person, place, and time.  NO FOCAL DEFICITS   Psychiatric: She has a normal mood and affect.  Vitals reviewed.         Assessment &  Plan:

## 2014-10-15 NOTE — Assessment & Plan Note (Signed)
SX NOT IDEALLY CONTROLLED MOST LIKELY DUE TO DIET. PT GAINED WEIGHT SINCE LAST YEAR. HUSBAND HAS BEEN ILL(DOUGLAS).  EAT BETTER. LOW FAT DIET LOSE 10 LBS CONTINUE PREVACID USE PHENERGAN AS NEEDED FOR NAUSEA AND VOMITING. IT MAY DROWSINESS. FOLLOW UP IN 1 YEAR.

## 2014-10-15 NOTE — Patient Instructions (Signed)
EAT BETTER. FOLLOW A LOW FAT DIET. SEE INFO BELOW.  LOSE 10 LBS.  CONTINUE PREVACID.  USE PHENERGAN AS NEEDED FOR NAUSEA AND VOMITING. IT MAY DROWSINESS.  FOLLOW UP IN 6 MOS-1 YEAR.   Low-Fat Diet BREADS, CEREALS, PASTA, RICE, DRIED PEAS, AND BEANS These products are high in carbohydrates and most are low in fat. Therefore, they can be increased in the diet as substitutes for fatty foods. They too, however, contain calories and should not be eaten in excess. Cereals can be eaten for snacks as well as for breakfast.   FRUITS AND VEGETABLES It is good to eat fruits and vegetables. Besides being sources of fiber, both are rich in vitamins and some minerals. They help you get the daily allowances of these nutrients. Fruits and vegetables can be used for snacks and desserts.  MEATS Limit lean meat, chicken, [place], and fish to no more than 6 ounces per day. Beef, Pork, and Lamb Use lean cuts of beef, pork, and lamb. Lean cuts include:  Extra-lean ground beef.  Arm roast.  Sirloin tip.  Center-cut ham.  Round steak.  Loin chops.  Rump roast.  Tenderloin.  Trim all fat off the outside of meats before cooking. It is not necessary to severely decrease the intake of red meat, but lean choices should be made. Lean meat is rich in protein and contains a highly absorbable form of iron. Premenopausal women, in particular, should avoid reducing lean red meat because this could increase the risk for low red blood cells (iron-deficiency anemia).  Chicken and [place] These are good sources of protein. The fat of poultry can be reduced by removing the skin and underlying fat layers before cooking. Chicken and [place] can be substituted for lean red meat in the diet. Poultry should not be fried or covered with high-fat sauces. Fish and Shellfish Fish is a good source of protein. Shellfish contain cholesterol, but they usually are low in saturated fatty acids. The preparation of fish is important. Like  chicken and [place], they should not be fried or covered with high-fat sauces. EGGS Egg whites contain no fat or cholesterol. They can be eaten often. Try 1 to 2 egg whites instead of whole eggs in recipes or use egg substitutes that do not contain yolk. MILK AND DAIRY PRODUCTS Use skim or 1% milk instead of 2% or whole milk. Decrease whole milk, natural, and processed cheeses. Use nonfat or low-fat (2%) cottage cheese or low-fat cheeses made from vegetable oils. Choose nonfat or low-fat (1 to 2%) yogurt. Experiment with evaporated skim milk in recipes that call for heavy cream. Substitute low-fat yogurt or low-fat cottage cheese for sour cream in dips and salad dressings. Have at least 2 servings of low-fat dairy products, such as 2 glasses of skim (or 1%) milk each day to help get your daily calcium intake. FATS AND OILS Reduce the total intake of fats, especially saturated fat. Butterfat, lard, and beef fats are high in saturated fat and cholesterol. These should be avoided as much as possible. Vegetable fats do not contain cholesterol, but certain vegetable fats, such as coconut oil, palm oil, and palm kernel oil are very high in saturated fats. These should be limited. These fats are often used in bakery goods, processed foods, popcorn, oils, and nondairy creamers. Vegetable shortenings and some peanut butters contain hydrogenated oils, which are also saturated fats. Read the labels on these foods and check for saturated vegetable oils. Unsaturated vegetable oils and fats do not raise  blood cholesterol. However, they should be limited because they are fats and are high in calories. Total fat should still be limited to 30% of your daily caloric intake. Desirable liquid vegetable oils are corn oil, cottonseed oil, olive oil, canola oil, safflower oil, soybean oil, and sunflower oil. Peanut oil is not as good, but small amounts are acceptable. Buy a heart-healthy tub margarine that has no partially  hydrogenated oils in the ingredients. Mayonnaise and salad dressings often are made from unsaturated fats, but they should also be limited because of their high calorie and fat content. Seeds, nuts, peanut butter, olives, and avocados are high in fat, but the fat is mainly the unsaturated type. These foods should be limited mainly to avoid excess calories and fat. OTHER EATING TIPS Snacks  Most sweets should be limited as snacks. They tend to be rich in calories and fats, and their caloric content outweighs their nutritional value. Some good choices in snacks are graham crackers, melba toast, soda crackers, bagels (no egg), English muffins, fruits, and vegetables. These snacks are preferable to snack crackers, [place] fries, TORTILLA CHIPS, and POTATO chips. Popcorn should be air-popped or cooked in small amounts of liquid vegetable oil. Desserts Eat fruit, low-fat yogurt, and fruit ices instead of pastries, cake, and cookies. Sherbet, angel food cake, gelatin dessert, frozen low-fat yogurt, or other frozen products that do not contain saturated fat (pure fruit juice bars, frozen ice pops) are also acceptable.  COOKING METHODS Choose those methods that use little or no fat. They include: Poaching.  Braising.  Steaming.  Grilling.  Baking.  Stir-frying.  Broiling.  Microwaving.  Foods can be cooked in a nonstick pan without added fat, or use a nonfat cooking spray in regular cookware. Limit fried foods and avoid frying in saturated fat. Add moisture to lean meats by using water, broth, cooking wines, and other nonfat or low-fat sauces along with the cooking methods mentioned above. Soups and stews should be chilled after cooking. The fat that forms on top after a few hours in the refrigerator should be skimmed off. When preparing meals, avoid using excess salt. Salt can contribute to raising blood pressure in some people.  EATING AWAY FROM HOME Order entres, potatoes, and vegetables without  sauces or butter. When meat exceeds the size of a deck of cards (3 to 4 ounces), the rest can be taken home for another meal. Choose vegetable or fruit salads and ask for low-calorie salad dressings to be served on the side. Use dressings sparingly. Limit high-fat toppings, such as bacon, crumbled eggs, cheese, sunflower seeds, and olives. Ask for heart-healthy tub margarine instead of butter.

## 2014-10-15 NOTE — Progress Notes (Signed)
ON RECALL LIST  °

## 2014-10-16 NOTE — Progress Notes (Signed)
cc'ed to pcp °

## 2014-11-10 ENCOUNTER — Other Ambulatory Visit: Payer: Self-pay | Admitting: Family Medicine

## 2014-11-26 ENCOUNTER — Other Ambulatory Visit: Payer: Self-pay | Admitting: Family Medicine

## 2014-11-26 DIAGNOSIS — Z09 Encounter for follow-up examination after completed treatment for conditions other than malignant neoplasm: Secondary | ICD-10-CM | POA: Diagnosis not present

## 2014-12-15 ENCOUNTER — Encounter: Payer: Self-pay | Admitting: Family Medicine

## 2014-12-21 ENCOUNTER — Other Ambulatory Visit: Payer: Self-pay | Admitting: Family Medicine

## 2014-12-21 MED ORDER — TEMAZEPAM 30 MG PO CAPS: 30.0000 mg | ORAL_CAPSULE | Freq: Every day | ORAL | Status: DC

## 2014-12-21 NOTE — Telephone Encounter (Signed)
May refill this +4 additional refills 

## 2014-12-21 NOTE — Telephone Encounter (Signed)
Last seen 10/12/14

## 2015-01-15 ENCOUNTER — Other Ambulatory Visit: Payer: Self-pay | Admitting: Family Medicine

## 2015-01-15 DIAGNOSIS — Z79899 Other long term (current) drug therapy: Secondary | ICD-10-CM | POA: Diagnosis not present

## 2015-01-15 DIAGNOSIS — R739 Hyperglycemia, unspecified: Secondary | ICD-10-CM | POA: Diagnosis not present

## 2015-01-15 DIAGNOSIS — E785 Hyperlipidemia, unspecified: Secondary | ICD-10-CM | POA: Diagnosis not present

## 2015-01-16 LAB — BASIC METABOLIC PANEL
BUN / CREAT RATIO: 16 (ref 11–26)
BUN: 13 mg/dL (ref 8–27)
CHLORIDE: 97 mmol/L (ref 97–108)
CO2: 27 mmol/L (ref 18–29)
CREATININE: 0.82 mg/dL (ref 0.57–1.00)
Calcium: 10.2 mg/dL (ref 8.7–10.3)
GFR calc Af Amer: 83 mL/min/{1.73_m2} (ref >59)
GFR calc non Af Amer: 72 mL/min/{1.73_m2} (ref >59)
GLUCOSE: 109 mg/dL — AB (ref 65–99)
Potassium: 3.8 mmol/L (ref 3.5–5.2)
Sodium: 142 mmol/L (ref 134–144)

## 2015-01-16 LAB — LIPID PANEL W/O CHOL/HDL RATIO
CHOLESTEROL TOTAL: 159 mg/dL (ref 100–199)
HDL: 45 mg/dL (ref >39)
LDL Calculated: 97 mg/dL (ref 0–99)
TRIGLYCERIDES: 85 mg/dL (ref 0–149)
VLDL Cholesterol Cal: 17 mg/dL (ref 5–40)

## 2015-01-16 LAB — HEPATIC FUNCTION PANEL
ALBUMIN: 4.6 g/dL (ref 3.5–4.8)
ALT: 18 IU/L (ref 0–32)
AST: 27 IU/L (ref 0–40)
Alkaline Phosphatase: 79 IU/L (ref 39–117)
BILIRUBIN TOTAL: 0.6 mg/dL (ref 0.0–1.2)
Bilirubin, Direct: 0.16 mg/dL (ref 0.00–0.40)
Total Protein: 7.5 g/dL (ref 6.0–8.5)

## 2015-01-16 LAB — HGB A1C W/O EAG: HEMOGLOBIN A1C: 5.9 % — AB (ref 4.8–5.6)

## 2015-01-18 ENCOUNTER — Encounter: Payer: Self-pay | Admitting: Family Medicine

## 2015-02-25 ENCOUNTER — Other Ambulatory Visit: Payer: Self-pay | Admitting: Family Medicine

## 2015-02-25 NOTE — Telephone Encounter (Signed)
Needs office visit.

## 2015-03-17 ENCOUNTER — Encounter: Payer: Self-pay | Admitting: Gastroenterology

## 2015-03-29 ENCOUNTER — Other Ambulatory Visit: Payer: Self-pay | Admitting: Family Medicine

## 2015-03-29 ENCOUNTER — Encounter: Payer: Self-pay | Admitting: Family Medicine

## 2015-03-29 ENCOUNTER — Ambulatory Visit (INDEPENDENT_AMBULATORY_CARE_PROVIDER_SITE_OTHER): Payer: Medicare Other | Admitting: Family Medicine

## 2015-03-29 VITALS — BP 148/70 | Temp 98.2°F | Ht 66.0 in | Wt 191.0 lb

## 2015-03-29 DIAGNOSIS — I10 Essential (primary) hypertension: Secondary | ICD-10-CM | POA: Diagnosis not present

## 2015-03-29 DIAGNOSIS — R42 Dizziness and giddiness: Secondary | ICD-10-CM

## 2015-03-29 MED ORDER — TRIAMTERENE-HCTZ 50-25 MG PO CAPS: 1.0000 | ORAL_CAPSULE | ORAL | Status: DC

## 2015-03-29 NOTE — Patient Instructions (Signed)
You will need to pick up the new dose of blood pressure medicine 50/25 mg and stop Dyazide 37.5/25  Recheck here in 2 weeks  I would recommend you use Centrum one a day if you want to use a vitamin

## 2015-03-29 NOTE — Progress Notes (Signed)
   Subjective:    Patient ID: Melissa Barajas, female    DOB: 05-13-1941, 74 y.o.   MRN: [id number]  Dizziness This is a new problem. Episode onset: 2 -3 weeks  Associated symptoms include headaches and nausea. She has tried nothing for the symptoms.   she describes a sensation is feeling slightly woozy in her head she states at times things seem to waver but they don't spend she denies headaches denies vomiting denies unilateral numbness or weakness  PMH benign-hyperlipidemia-hypertension  Review of Systems  Gastrointestinal: Positive for nausea.  Neurological: Positive for dizziness and headaches.   She states at times she feels unsteady. She denies the room spinning. She denies unilateral numbness or weakness denies difficulty speaking or swallowing.    Objective:   Physical Exam Lungs clear hearts regular pulse normal neck no masses patient does not have any asymmetrical weakness neurologic grossly normal stands without wavering blood pressure taken on 3 separate occasions elevated       Assessment & Plan:  HTN-I believe this is giving her side effects of feeling dizzy. I find no evidence of a stroke. I doubt in her ear. She does state at times when she feels dizzy she gets slightly nauseated which raises into question vertigo but she does not describe the room spinning. Increase the dose of her diarrhetic. Follow-up within 2 weeks to recheck blood pressure.  Patient is concerned about her cholesterol medicine she feels that it is potential for causing problems but she denies arthralgias or muscle aches I encouraged her to stay with the medicine

## 2015-04-12 ENCOUNTER — Encounter: Payer: Self-pay | Admitting: Family Medicine

## 2015-04-12 ENCOUNTER — Ambulatory Visit (HOSPITAL_COMMUNITY)
Admission: RE | Admit: 2015-04-12 | Discharge: 2015-04-12 | Disposition: A | Discharge status: Home / Self Care | Payer: Medicare Other | Source: Ambulatory Visit | Attending: Family Medicine | Admitting: Family Medicine

## 2015-04-12 ENCOUNTER — Ambulatory Visit (INDEPENDENT_AMBULATORY_CARE_PROVIDER_SITE_OTHER): Payer: Medicare Other | Admitting: Family Medicine

## 2015-04-12 VITALS — BP 122/84 | Ht 66.0 in | Wt 191.0 lb

## 2015-04-12 DIAGNOSIS — I10 Essential (primary) hypertension: Secondary | ICD-10-CM | POA: Insufficient documentation

## 2015-04-12 DIAGNOSIS — D1720 Benign lipomatous neoplasm of skin and subcutaneous tissue of unspecified limb: Secondary | ICD-10-CM | POA: Diagnosis not present

## 2015-04-12 DIAGNOSIS — R0609 Other forms of dyspnea: Secondary | ICD-10-CM | POA: Diagnosis not present

## 2015-04-12 DIAGNOSIS — R5383 Other fatigue: Secondary | ICD-10-CM | POA: Diagnosis not present

## 2015-04-12 NOTE — Progress Notes (Signed)
   Subjective:    Patient ID: Melissa Barajas, female    DOB: 1941-04-24, 74 y.o.   MRN: [id number]  Hypertension This is a chronic problem. The current episode started more than 1 year ago. Pertinent negatives include no chest pain. There are no compliance problems.    Patient in today for a two week blood pressure follow. Patient has c/o wheezing, and swelling to left forearm.  Patient relates she is no longer dizzy. She is not having any headaches. She feels she is doing better on current blood pressure medicine  Patient relates that over the past 2 weeks she has noticed shortness of breath with activity she is noticed that when she sweeps or mops she can only do it for approximately 5 minutes then she has to stop. She states that she gets fatigued and has to sit down and rest when she catches her breath then she reassumes activity. She denies any chest pressure or tightness. She states she is able to walk without shortness of breath but she admits that she does not go walking for exercise she only walks at the store when she shops. She also denies any shortness of breath with rest but does relate hearing a little bit of wheezing at night time  She also relates that her left forearm is progressively swelling getting larger. She states that this area was felt to be a lipoma but it has gotten dramatically bigger over the past 6 months. There is no pain with it.  Review of Systems  Constitutional: Negative for activity change, appetite change and fatigue.  HENT: Negative for congestion.   Respiratory: Negative for cough.   Cardiovascular: Negative for chest pain.  Gastrointestinal: Negative for abdominal pain.  Endocrine: Negative for polydipsia and polyphagia.  Neurological: Negative for weakness.  Psychiatric/Behavioral: Negative for confusion.       Objective:   Physical Exam  Constitutional: She appears well-nourished. No distress.  Cardiovascular: Normal rate, regular rhythm and normal  heart sounds.   No murmur heard. Pulmonary/Chest: Effort normal and breath sounds normal. No respiratory distress.  Musculoskeletal: She exhibits no edema.  Lymphadenopathy:    She has no cervical adenopathy.  Neurological: She is alert. She exhibits normal muscle tone.  Psychiatric: Her behavior is normal.  Vitals reviewed.  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 It is almost double the size of what it was 1 year ago  EKG has some nonspecific changes.     Assessment & Plan:  Concerning underlying issues. Patient does have risk factors for heart disease. Borderline EKG. Dyspnea on exertion with aerobic activities that patient used to be able to do.  I believe it is in the patient's best interest to do cardiology consult to help rule out the possibility of coronary artery disease or potential left ventricular dysfunction. I doubt pulmonary embolism.  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 May well need referral to specialist to have this removed.  Hypertension under good control continue current measures recheck metabolic 7 because of diuretic  Patient with history of borderline anemia as well as the dyspnea on exertion need to check CBC  Because of patient's dyspnea on exertion and nighttime wheezing will do chest x-ray  Follow-up in 2 months

## 2015-04-13 LAB — CBC WITH DIFFERENTIAL/PLATELET
BASOS ABS: 0.1 10*3/uL (ref 0.0–0.2)
Basos: 1 %
EOS (ABSOLUTE): 0.4 10*3/uL (ref 0.0–0.4)
Eos: 4 %
HEMATOCRIT: 42 % (ref 34.0–46.6)
HEMOGLOBIN: 13.9 g/dL (ref 11.1–15.9)
Immature Grans (Abs): 0 10*3/uL (ref 0.0–0.1)
Immature Granulocytes: 0 %
LYMPHS ABS: 2.8 10*3/uL (ref 0.7–3.1)
LYMPHS: 33 %
MCH: 31 pg (ref 26.6–33.0)
MCHC: 33.1 g/dL (ref 31.5–35.7)
MCV: 94 fL (ref 79–97)
Monocytes Absolute: 0.9 10*3/uL (ref 0.1–0.9)
Monocytes: 10 %
NEUTROS ABS: 4.5 10*3/uL (ref 1.4–7.0)
Neutrophils: 52 %
PLATELETS: 268 10*3/uL (ref 150–379)
RBC: 4.48 x10E6/uL (ref 3.77–5.28)
RDW: 14.8 % (ref 12.3–15.4)
WBC: 8.6 10*3/uL (ref 3.4–10.8)

## 2015-04-13 LAB — BASIC METABOLIC PANEL
BUN / CREAT RATIO: 15 (ref 11–26)
BUN: 13 mg/dL (ref 8–27)
CO2: 27 mmol/L (ref 18–29)
Calcium: 10.4 mg/dL — ABNORMAL HIGH (ref 8.7–10.3)
Chloride: 97 mmol/L (ref 97–108)
Creatinine, Ser: 0.86 mg/dL (ref 0.57–1.00)
GFR calc Af Amer: 79 mL/min/{1.73_m2} (ref >59)
GFR calc non Af Amer: 68 mL/min/{1.73_m2} (ref >59)
Glucose: 103 mg/dL — ABNORMAL HIGH (ref 65–99)
POTASSIUM: 4.2 mmol/L (ref 3.5–5.2)
SODIUM: 142 mmol/L (ref 134–144)

## 2015-04-14 NOTE — Progress Notes (Signed)
August 5th

## 2015-04-15 ENCOUNTER — Encounter: Payer: Self-pay | Admitting: Family Medicine

## 2015-04-16 ENCOUNTER — Ambulatory Visit (HOSPITAL_COMMUNITY)
Admission: RE | Admit: 2015-04-16 | Discharge: 2015-04-16 | Disposition: A | Discharge status: Home / Self Care | Payer: Medicare Other | Source: Ambulatory Visit | Attending: Family Medicine | Admitting: Family Medicine

## 2015-04-16 DIAGNOSIS — M25522 Pain in left elbow: Secondary | ICD-10-CM | POA: Diagnosis not present

## 2015-04-16 DIAGNOSIS — M25822 Other specified joint disorders, left elbow: Secondary | ICD-10-CM | POA: Diagnosis not present

## 2015-04-16 DIAGNOSIS — D1722 Benign lipomatous neoplasm of skin and subcutaneous tissue of left arm: Secondary | ICD-10-CM | POA: Diagnosis not present

## 2015-04-19 ENCOUNTER — Other Ambulatory Visit: Payer: Self-pay | Admitting: *Deleted

## 2015-04-19 DIAGNOSIS — R2232 Localized swelling, mass and lump, left upper limb: Secondary | ICD-10-CM

## 2015-04-28 ENCOUNTER — Other Ambulatory Visit: Payer: Self-pay | Admitting: Family Medicine

## 2015-04-29 ENCOUNTER — Encounter: Payer: Self-pay | Admitting: Obstetrics & Gynecology

## 2015-04-29 ENCOUNTER — Ambulatory Visit (INDEPENDENT_AMBULATORY_CARE_PROVIDER_SITE_OTHER): Payer: Medicare Other | Admitting: Obstetrics & Gynecology

## 2015-04-29 VITALS — BP 100/60 | HR 60 | Ht 66.0 in | Wt 192.0 lb

## 2015-04-29 DIAGNOSIS — Z01419 Encounter for gynecological examination (general) (routine) without abnormal findings: Secondary | ICD-10-CM | POA: Diagnosis not present

## 2015-04-29 NOTE — Progress Notes (Signed)
Patient ID: Melissa Barajas, female   DOB: 06-02-1941, 74 y.o.   MRN: [id number] Subjective:     Melissa Barajas is a 74 y.o. female here for a routine exam.  No LMP recorded. Patient has had a hysterectomy. No obstetric history on file. Birth Control Method:  hysterectomy Menstrual Calendar(currently): na  Current complaints: none.   Current acute medical issues:  none   Recent Gynecologic History No LMP recorded. Patient has had a hysterectomy. Last Pap: na,   Last mammogram: 2016,  normal  Past Medical History  Diagnosis Date  . Hypertension   . GERD (gastroesophageal reflux disease)   . Hypercholesteremia   . Atrial fibrillation   . Arthritis   . Lipoma of arm     Left  . DIVERTICULOSIS OF COLON 08/25/2010    Qualifier: Diagnosis of  By: Hoy Morn    . HEMORRHOIDS, INTERNAL 08/25/2010    Qualifier: Diagnosis of  By: Hoy Morn      Past Surgical History  Procedure Laterality Date  . 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 Thyroidectomy    . Cystectomy      Left breast  . Tumor   05/2010    Left breast under nipple  . Esophagogastroduodenoscopy      [id number] appearing Schatzki's ring, possible cervical esophageal web, both dilated with passage of a Maloney dilator/small hiatal hernia  . Abdominal hysterectomy      Partial  . Colonoscopy      OB History    No data available      Social History   Social History  . Marital Status: Married    Spouse Name: N/A  . Number of Children: N/A  . Years of Education: N/A   Social History Main Topics  . Smoking status: Never Smoker   . Smokeless tobacco: None  . Alcohol Use: No  . Drug Use: No  . Sexual Activity: Not Asked   Other Topics Concern  . None   Social History Narrative    Family History  Problem Relation Age of Onset  . Cancer Brother     Prostate     Current outpatient prescriptions:  .  Biotin (BIOTIN MAXIMUM STRENGTH) 10 MG TABS, Take by mouth., Disp: ,  Rfl:  .  Calcium Carbonate-Vitamin D (CALTRATE 600+D PO), Take 1 tablet by mouth daily. , Disp: , Rfl:  .  CARTIA XT 300 MG 24 hr capsule, TAKE 1 CAPSULE BY MOUTH EVERY DAY, Disp: 90 capsule, Rfl: 1 .  naproxen (NAPROSYN) 375 MG tablet, TAKE 1 TABLET BY MOUTH TWICE DAILY. STOP DICLOFENAC, Disp: 60 tablet, Rfl: 0 .  OMEGA FATTY ACIDS-VITAMINS PO, Take by mouth., Disp: , Rfl:  .  Omeprazole Magnesium 20.6 (20 BASE) MG CPDR, Take by mouth., Disp: , Rfl:  .  pravastatin (PRAVACHOL) 80 MG tablet, TAKE 1 TABLET BY MOUTH EVERY DAY, Disp: 90 tablet, Rfl: 1 .  Probiotic Product (PROBIOTIC DAILY PO), Take by mouth., Disp: , Rfl:  .  temazepam (RESTORIL) 30 MG capsule, Take 1 capsule (30 mg total) by mouth at bedtime., Disp: 90 capsule, Rfl: 1 .  triamterene-hydrochlorothiazide (DYAZIDE) 50-25 MG per capsule, TAKE 1 CAPSULE BY MOUTH EVERY MORNING, Disp: 90 capsule, Rfl: 6 .  OVER THE COUNTER MEDICATION, Focus factor, Disp: , Rfl:  .  OVER THE COUNTER MEDICATION, Strong Heart, Disp: , Rfl:  .  OVER THE COUNTER MEDICATION, Hair, skin and nails, Disp: , Rfl:  Review of Systems  Review of Systems  Constitutional: Negative for fever, chills, weight loss, malaise/fatigue and diaphoresis.  HENT: Negative for hearing loss, ear pain, nosebleeds, congestion, sore throat, neck pain, tinnitus and ear discharge.   Eyes: Negative for blurred vision, double vision, photophobia, pain, discharge and redness.  Respiratory: Negative for cough, hemoptysis, sputum production, shortness of breath, wheezing and stridor.   Cardiovascular: Negative for chest pain, palpitations, orthopnea, claudication, leg swelling and PND.  Gastrointestinal: negative for abdominal pain. Negative for heartburn, nausea, vomiting, diarrhea, constipation, blood in stool and melena.  Genitourinary: Negative for dysuria, urgency, frequency, hematuria and flank pain.  Musculoskeletal: Negative for myalgias, back pain, joint pain and falls.  Skin:  Negative for itching and rash.  Neurological: Negative for dizziness, tingling, tremors, sensory change, speech change, focal weakness, seizures, loss of consciousness, weakness and headaches.  Endo/Heme/Allergies: Negative for environmental allergies and polydipsia. Does not bruise/bleed easily.  Psychiatric/Behavioral: Negative for depression, suicidal ideas, hallucinations, memory loss and substance abuse. The patient is not nervous/anxious and does not have insomnia.        Objective:  Blood pressure 100/60, pulse 60, height 5\' 6"  (1.676 m), weight 192 lb (87.091 kg).   Physical Exam  Vitals reviewed. Constitutional: She is oriented to person, place, and time. She appears well-developed and well-nourished.  HENT:  Head: Normocephalic and atraumatic.        Right Ear: External ear normal.  Left Ear: External ear normal.  Nose: Nose normal.  Mouth/Throat: Oropharynx is clear and moist.  Eyes: Conjunctivae and EOM are normal. Pupils are equal, round, and reactive to light. Right eye exhibits no discharge. Left eye exhibits no discharge. No scleral icterus.  Neck: Normal range of motion. Neck supple. No tracheal deviation present. No thyromegaly present.  Cardiovascular: Normal rate, regular rhythm, normal heart sounds and intact distal pulses.  Exam reveals no gallop and no friction rub.   No murmur heard. Respiratory: Effort normal and breath sounds normal. No respiratory distress. She has no wheezes. She has no rales. She exhibits no tenderness.  GI: Soft. Bowel sounds are normal. She exhibits no distension and no mass. There is no tenderness. There is no rebound and no guarding.  Genitourinary:  Breasts no masses skin changes or nipple changes bilaterally      Vulva is normal without lesions Vagina is pink moist without discharge Cervix absnet Uterus is absent Adnexa is negative {Rectal    hemoccult negative, normal tone, no masses  Musculoskeletal: Normal range of motion. She  exhibits no edema and no tenderness.  Neurological: She is alert and oriented to person, place, and time. She has normal reflexes. She displays normal reflexes. No cranial nerve deficit. She exhibits normal muscle tone. Coordination normal.  Skin: Skin is warm and dry. No rash noted. No erythema. No pallor.  Psychiatric: She has a normal mood and affect. Her behavior is normal. Judgment and thought content normal.       Assessment:    Healthy female exam.    Plan:    Mammogram ordered. Follow up in: 2 years.

## 2015-05-03 ENCOUNTER — Ambulatory Visit (INDEPENDENT_AMBULATORY_CARE_PROVIDER_SITE_OTHER): Payer: Medicare Other | Admitting: Cardiovascular Disease

## 2015-05-03 ENCOUNTER — Encounter: Payer: Self-pay | Admitting: Cardiovascular Disease

## 2015-05-03 VITALS — BP 138/70 | HR 64 | Ht 66.0 in | Wt 190.1 lb

## 2015-05-03 DIAGNOSIS — E785 Hyperlipidemia, unspecified: Secondary | ICD-10-CM

## 2015-05-03 DIAGNOSIS — I10 Essential (primary) hypertension: Secondary | ICD-10-CM | POA: Diagnosis not present

## 2015-05-03 DIAGNOSIS — R0609 Other forms of dyspnea: Secondary | ICD-10-CM | POA: Diagnosis not present

## 2015-05-03 DIAGNOSIS — R5383 Other fatigue: Secondary | ICD-10-CM

## 2015-05-03 DIAGNOSIS — Z87891 Personal history of nicotine dependence: Secondary | ICD-10-CM

## 2015-05-03 DIAGNOSIS — R072 Precordial pain: Secondary | ICD-10-CM

## 2015-05-03 NOTE — Progress Notes (Signed)
Patient ID: Melissa Barajas, female   DOB: Dec 20, 1940, 74 y.o.   MRN: [id number]       CARDIOLOGY CONSULT NOTE  Patient ID: Melissa Barajas MRN: [id number] DOB/AGE: [birth year] 74 y.o.  Admit date: (Not on file) Primary Physician Sallee Lange, MD  Reason for Consultation: shortness of breath  HPI: The patient is a 74 year old woman with a history of hypertension and hyperlipidemia who is referred for the evaluation of exertional dyspnea. She now has to sit down after 5 minutes of routine daily activities which she used to be able to perform without having to stop, such as mopping, sweeping, and climbing a flight of stairs. She has associated wheezing. She says there is no mold in the house. She has had a cat for 9 years. She denies paroxysmal nocturnal dyspnea. She sleeps with one pillow and a rolled up towel to elevate her head due to severe acid reflux disease. She experiences retrosternal chest pain approximately once or twice per year when lying down at night. She has also felt prematurely fatigued. She denies palpitations and leg swelling.  She had a chest x-ray on 8/1 which showed no active cardiopulmonary disease.  ECG performed on 8/1 by her PCP demonstrated normal sinus rhythm with a right bundle branch block and left anterior fascicular block.  Soc: Quit smoking 20 yrs ago, used to smoke 1 ppd. Widowed.   Allergies  Allergen Reactions  . Hydrocodone Nausea Only  . Lisinopril     Angio edema  . Vioxx [Rofecoxib]     Current Outpatient Prescriptions  Medication Sig Dispense Refill  . Biotin (BIOTIN MAXIMUM STRENGTH) 10 MG TABS Take by mouth.    . Calcium Carbonate-Vitamin D (CALTRATE 600+D PO) Take 1 tablet by mouth daily.     Marland Kitchen CARTIA XT 300 MG 24 hr capsule TAKE 1 CAPSULE BY MOUTH EVERY DAY 90 capsule 1  . naproxen (NAPROSYN) 375 MG tablet TAKE 1 TABLET BY MOUTH TWICE DAILY. STOP DICLOFENAC 60 tablet 0  . OMEGA FATTY ACIDS-VITAMINS PO Take by mouth.    . Omeprazole Magnesium  20.6 (20 BASE) MG CPDR Take by mouth.    Marland Kitchen OVER THE COUNTER MEDICATION Focus factor    . OVER THE COUNTER MEDICATION Strong Heart    . OVER THE COUNTER MEDICATION Hair, skin and nails    . pravastatin (PRAVACHOL) 80 MG tablet TAKE 1 TABLET BY MOUTH EVERY DAY 90 tablet 1  . Probiotic Product (PROBIOTIC DAILY PO) Take by mouth.    . temazepam (RESTORIL) 30 MG capsule Take 1 capsule (30 mg total) by mouth at bedtime. 90 capsule 1  . triamterene-hydrochlorothiazide (DYAZIDE) 50-25 MG per capsule TAKE 1 CAPSULE BY MOUTH EVERY MORNING 90 capsule 6   No current facility-administered medications for this visit.    Past Medical History  Diagnosis Date  . Hypertension   . GERD (gastroesophageal reflux disease)   . Hypercholesteremia   . Atrial fibrillation   . Arthritis   . Lipoma of arm     Left  . DIVERTICULOSIS OF COLON 08/25/2010    Qualifier: Diagnosis of  By: Hoy Morn    . HEMORRHOIDS, INTERNAL 08/25/2010    Qualifier: Diagnosis of  By: Hoy Morn      Past Surgical History  Procedure Laterality Date  . 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 Thyroidectomy    . Cystectomy      Left breast  . Tumor   05/2010  Left breast under nipple  . Esophagogastroduodenoscopy      [id number] appearing Schatzki's ring, possible cervical esophageal web, both dilated with passage of a Maloney dilator/small hiatal hernia  . Abdominal hysterectomy      Partial  . Colonoscopy      Social History   Social History  . Marital Status: Married    Spouse Name: N/A  . Number of Children: N/A  . Years of Education: N/A   Occupational History  . Not on file.   Social History Main Topics  . Smoking status: Never Smoker   . Smokeless tobacco: Not on file  . Alcohol Use: No  . Drug Use: No  . Sexual Activity: Not on file   Other Topics Concern  . Not on file   Social History Narrative     No family history of premature CAD in 1st degree  relatives.  Prior to Admission medications   Medication Sig Start Date End Date Taking? Authorizing Provider  Biotin (BIOTIN MAXIMUM STRENGTH) 10 MG TABS Take by mouth.   Yes Historical Provider, MD  Calcium Carbonate-Vitamin D (CALTRATE 600+D PO) Take 1 tablet by mouth daily.    Yes Historical Provider, MD  CARTIA XT 300 MG 24 hr capsule TAKE 1 CAPSULE BY MOUTH EVERY DAY 11/27/14  Yes Kathyrn Drown, MD  naproxen (NAPROSYN) 375 MG tablet TAKE 1 TABLET BY MOUTH TWICE DAILY. STOP DICLOFENAC 04/28/15  Yes Kathyrn Drown, MD  OMEGA FATTY ACIDS-VITAMINS PO Take by mouth.   Yes Historical Provider, MD  Omeprazole Magnesium 20.6 (20 BASE) MG CPDR Take by mouth.   Yes Historical Provider, MD  OVER THE COUNTER MEDICATION Focus factor   Yes Historical Provider, MD  OVER THE COUNTER MEDICATION Strong Heart   Yes Historical Provider, MD  OVER THE COUNTER MEDICATION Hair, skin and nails   Yes Historical Provider, MD  pravastatin (PRAVACHOL) 80 MG tablet TAKE 1 TABLET BY MOUTH EVERY DAY 11/27/14  Yes Kathyrn Drown, MD  Probiotic Product (PROBIOTIC DAILY PO) Take by mouth.   Yes Historical Provider, MD  temazepam (RESTORIL) 30 MG capsule Take 1 capsule (30 mg total) by mouth at bedtime. 12/21/14  Yes Kathyrn Drown, MD  triamterene-hydrochlorothiazide (DYAZIDE) 50-25 MG per capsule TAKE 1 CAPSULE BY MOUTH EVERY MORNING 03/30/15  Yes Kathyrn Drown, MD     Review of systems complete and found to be negative unless listed above in HPI     Physical exam Blood pressure 138/70, pulse 64, height 5\' 6"  (1.676 m), weight 190 lb 1.9 oz (86.238 kg), SpO2 97 %. General: NAD Neck: No JVD, no thyromegaly or thyroid nodule.  Lungs: Clear to auscultation bilaterally with normal respiratory effort. CV: Nondisplaced PMI. Regular rate and rhythm, normal S1/S2, no S3/S4, no murmur.  No peripheral edema.  No carotid bruit.  Normal pedal pulses.  Abdomen: Soft, nontender, no hepatosplenomegaly, no distention.  Skin: Intact  without lesions or rashes.  Neurologic: Alert and oriented x 3.  Psych: Normal affect. Extremities: No clubbing or cyanosis.  HEENT: Normal.   ECG: Most recent ECG reviewed.  Labs:   Lab Results  Component Value Date   WBC 8.6 04/12/2015   HGB 13.2 05/23/2010   HCT 42.0 04/12/2015   MCV 93.4 05/20/2010   PLT 221 05/20/2010   No results for input(s): NA, K, CL, CO2, BUN, CREATININE, CALCIUM, PROT, BILITOT, ALKPHOS, ALT, AST, GLUCOSE in the last 168 hours.  Invalid input(s): LABALBU No results found for: CKTOTAL,  CKMB, CKMBINDEX, TROPONINI  Lab Results  Component Value Date   CHOL 159 01/15/2015   CHOL 143 10/23/2013   Lab Results  Component Value Date   HDL 45 01/15/2015   HDL 37* 10/23/2013   Lab Results  Component Value Date   LDLCALC 97 01/15/2015   LDLCALC 90 10/23/2013   Lab Results  Component Value Date   TRIG 85 01/15/2015   TRIG 82 10/23/2013   Lab Results  Component Value Date   CHOLHDL 3.9 10/23/2013   No results found for: LDLDIRECT       Studies: No results found.  ASSESSMENT AND PLAN:  1. Exertional dyspnea, wheezing, and premature fatigue: Chest xray normal. ECG nonspecific. Has CV risk factors and limited exercise tolerance. Will obtain echo and exercise Cardiolite stress test for further clarification.  2. Essential HTN: Well controlled. No changes to therapy.  3. Hyperlipidemia: Lipids on 01/15/15 showed total cholesterol 159, triglycerides 85, HDL 45, LDL 97. Continue pravastatin.  Dispo: f/u 6 weeks.   Signed: Kate Sable, M.D., F.A.C.C.  05/03/2015, 10:21 AM

## 2015-05-03 NOTE — Patient Instructions (Signed)
Your physician recommends that you schedule a follow-up appointment in:  6 weeks with Dr Angelina Pih the day before and the morning of test   Your physician has requested that you have en exercise stress myoview. For further information please visit [email or web address]. Please follow instruction sheet, as given.     Your physician has requested that you have an echocardiogram. Echocardiography is a painless test that uses sound waves to create images of your heart. It provides your doctor with information about the size and shape of your heart and how well your heart's chambers and valves are working. This procedure takes approximately one hour. There are no restrictions for this procedure.     Thank you for choosing [facility] !

## 2015-05-04 ENCOUNTER — Telehealth: Payer: Self-pay | Admitting: Family Medicine

## 2015-05-04 NOTE — Telephone Encounter (Signed)
Nurse's-I received a note from orthopedics in [place] that they are referring Melissa Barajas to [facility] for the treatment of the growth on her forearm. Please find out from the patient if she has heard when her appointment is? Also find out from the patient if she has her MRI on a CD-ROM? Once we know when her appointment is I would like to also provide her a copy of our notes and previous tests for the benefit of the specialists at [facility]. I will need her paper chart. Please let me know when her appointment is. (If she does not know when her appointment is please call [place] office at [facility] [phone] to see if they know when her appointment is. Thank you

## 2015-05-05 NOTE — Telephone Encounter (Signed)
Spoke with patient per Dr.Scott and patient informed me that her appointment for [facility] is 05/14/15 @ 11:00 am. Patient stated that she does have MRI on CD-ROM and stated that she would be in this afternoon or tomorrow to pick up a copy of Dr.Scott's notes and and previous tests.

## 2015-05-06 ENCOUNTER — Ambulatory Visit (INDEPENDENT_AMBULATORY_CARE_PROVIDER_SITE_OTHER): Payer: Medicare Other | Admitting: Gastroenterology

## 2015-05-06 ENCOUNTER — Encounter: Payer: Self-pay | Admitting: Gastroenterology

## 2015-05-06 VITALS — BP 154/70 | HR 66 | Temp 98.4°F | Ht 66.0 in | Wt 190.4 lb

## 2015-05-06 DIAGNOSIS — K30 Functional dyspepsia: Secondary | ICD-10-CM

## 2015-05-06 DIAGNOSIS — I10 Essential (primary) hypertension: Secondary | ICD-10-CM | POA: Diagnosis not present

## 2015-05-06 DIAGNOSIS — K219 Gastro-esophageal reflux disease without esophagitis: Secondary | ICD-10-CM | POA: Diagnosis not present

## 2015-05-06 NOTE — Progress Notes (Signed)
Subjective:    Patient ID: Melissa Barajas, female    DOB: Oct 30, 1940, 74 y.o.   MRN: [id number]  Melissa Lange, MD  HPI HAVING STRESS TEST & ECHO TOMORROW. HAVING TROUBLE WITH ABDOMINAL PAIN(SHARP) AND BLOATING FOR A COUPLE OF MOS. OCCURS ONCE A WEEK. DOESN'T FEEL RIGHT. FOOD DOESN'T CHANGE IT. UNDER STRESS: EATS OUT A LOT. HUSBAND-PASSED APR 28. PENDING WORKUP FOR LEFT ARM LESION AT [facility]. RARE NAUSEA: ALL HER LIFE, LASTS FOR SECONDS AND THEN GOES AWAY. BMs: PRETTY GOOD WITH PROBIOTIC-DAILY. 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  NOT EXERCISING. DRINKS  ALMOND MILK. MAY EAT ICE CREAM BUT USES LACTASE, BUT NOT WITH EATING CHEESE.  PT DENIES FEVER, CHILLS, HEMATOCHEZIA, nausea, vomiting, melena, diarrhea, CHEST PAIN, CHANGE IN BOWEL IN HABITS, constipation, problems swallowing, PROBLEMS WITH SEDATION, OR heartburn or indigestion.   Past Medical History  Diagnosis Date  . Hypertension   . GERD (gastroesophageal reflux disease)   . Hypercholesteremia   . Atrial fibrillation   . Arthritis   . Lipoma of arm     Left  . DIVERTICULOSIS OF COLON 08/25/2010    Qualifier: Diagnosis of  By: Hoy Morn    . HEMORRHOIDS, INTERNAL 08/25/2010    Qualifier: Diagnosis of  By: Hoy Morn      Past Surgical History  Procedure Laterality Date  . 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 Thyroidectomy    . Cystectomy      Left breast  . Tumor   05/2010    Left breast under nipple  . Esophagogastroduodenoscopy      [id number] appearing Schatzki's ring, possible cervical esophageal web, both dilated with passage of a Maloney dilator/small hiatal hernia  . Abdominal hysterectomy      Partial  . Colonoscopy     Allergies  Allergen Reactions  . Hydrocodone Nausea Only  . Lisinopril     Angio edema  . Vioxx [Rofecoxib]    \ Current Outpatient Prescriptions  Medication Sig Dispense Refill  . Biotin (BIOTIN MAXIMUM STRENGTH) 10 MG TABS Take by  mouth.    . Calcium Carbonate-Vitamin D (CALTRATE 600+D PO) Take 1 tablet by mouth daily.     Marland Kitchen CARTIA XT 300 MG 24 hr capsule TAKE 1 CAPSULE BY MOUTH EVERY DAY    . naproxen (NAPROSYN) 375 MG tablet TAKE 1 TABLET BY MOUTH TWICE DAILY. STOP DICLOFENAC    . OMEGA FATTY ACIDS-VITAMINS PO Take by mouth.    . Omeprazole Magnesium 20.6 (20 BASE) MG CPDR Take by mouth.    Marland Kitchen OVER THE COUNTER MEDICATION Focus factor    . OVER THE COUNTER MEDICATION Strong Heart    . OVER THE COUNTER MEDICATION Hair, skin and nails    . pravastatin (PRAVACHOL) 80 MG tablet TAKE 1 TABLET BY MOUTH EVERY DAY    . Probiotic Product (PROBIOTIC DAILY PO) Take by mouth.    . temazepam (RESTORIL) 30 MG capsule Take 1 capsule (30 mg total) by mouth at bedtime.    . triamterene-hydrochlorothiazide (DYAZIDE) 50-25 MG per capsule TAKE 1 CAPSULE BY MOUTH EVERY MORNING     Review of Systems PER HPI OTHERWISE ALL SYSTEMS ARE NEGATIVE.     Objective:   Physical Exam  Constitutional: She is oriented to person, place, and time. She appears well-developed and well-nourished. No distress.  HENT:  Head: Normocephalic and atraumatic.  Mouth/Throat: Oropharynx is clear and moist. No oropharyngeal exudate.  Eyes: Pupils  are equal, round, and reactive to light. No scleral icterus.  Neck: Normal range of motion. Neck supple.  Cardiovascular: Normal rate, regular rhythm and normal heart sounds.   Pulmonary/Chest: Effort normal and breath sounds normal. No respiratory distress.  Abdominal: Soft. Bowel sounds are normal. She exhibits no distension. There is no tenderness.  Musculoskeletal: She exhibits no edema.  Deformity on L forearm  Lymphadenopathy:    She has no cervical adenopathy.  Neurological: She is alert and oriented to person, place, and time.  NO FOCAL DEFICITS   Psychiatric: She has a normal mood and affect.  Vitals reviewed.         Assessment & Plan:

## 2015-05-06 NOTE — Progress Notes (Signed)
ON RECALL  °

## 2015-05-06 NOTE — Assessment & Plan Note (Addendum)
Getting worse. LAST EGD 2011 &CT ABD/PELVIS 2014 REVIEWED.  DIFFERENTIAL DIAGNOSIS INCLUDES: NSAID OR H PYLORI GASTRITIS, SMALL INTESTINE BACTERIAL OVERGROWTH, LACTOSE INTOLERNACE. LESS LIKELY GASTRIC CANCER OR EOSINOPHILIC GASTRITIS.  Upper endoscopy AFTER SEP 2. DISCUSSED PROCEDURE, BENEFITS, & RISKS: < 1% chance of medication reaction, OR bleeding. CONTINUEOMEPRAZOLE. TAKE 30 MINUTES PRIOR TO BREAKFAST. LACTASE WITH MEALS THREE TIMES A DAY FOLLOW UP IN 4 MOS.

## 2015-05-06 NOTE — Patient Instructions (Addendum)
YOUR BLOOD PRESSURE WAS ELEVATED TODAY. YOU SHOULD SEE DR. Wolfgang Phoenix FOR TO RECHECK YOUR BLOOD PRESSURE.  CONTINUE YOUR WEIGHT LOSS EFFORTS.  CONTINUE OMEPRAZOLE. TAKE 30 MINUTES PRIOR TO BREAKFAST.  LACTASE THREE PILLS WITH MEALS THREE TIMES A DAY ESPECIALLY WHEN EATING ICE CREAM OR CHEESE.  Upper endoscopy AFTER SEP 2.  FOLLOW UP IN 4 MOS.

## 2015-05-06 NOTE — Progress Notes (Signed)
cc'ed to pcp °

## 2015-05-06 NOTE — Assessment & Plan Note (Signed)
NOT CONTROLED.  SEE PCP FOR RECHECK BP AND ADJUST MEDS.

## 2015-05-07 ENCOUNTER — Encounter (HOSPITAL_COMMUNITY)
Admission: RE | Admit: 2015-05-07 | Discharge: 2015-05-07 | Disposition: A | Discharge status: Home / Self Care | Payer: Medicare Other | Source: Ambulatory Visit | Attending: Cardiovascular Disease | Admitting: Cardiovascular Disease

## 2015-05-07 ENCOUNTER — Encounter (HOSPITAL_COMMUNITY): Payer: Self-pay

## 2015-05-07 ENCOUNTER — Inpatient Hospital Stay (HOSPITAL_COMMUNITY): Admission: RE | Admit: 2015-05-07 | Payer: PRIVATE HEALTH INSURANCE | Source: Ambulatory Visit

## 2015-05-07 ENCOUNTER — Ambulatory Visit (HOSPITAL_COMMUNITY)
Admission: RE | Admit: 2015-05-07 | Discharge: 2015-05-07 | Disposition: A | Discharge status: Home / Self Care | Payer: Medicare Other | Source: Ambulatory Visit | Attending: Cardiovascular Disease | Admitting: Cardiovascular Disease

## 2015-05-07 DIAGNOSIS — I10 Essential (primary) hypertension: Secondary | ICD-10-CM | POA: Insufficient documentation

## 2015-05-07 DIAGNOSIS — I340 Nonrheumatic mitral (valve) insufficiency: Secondary | ICD-10-CM | POA: Insufficient documentation

## 2015-05-07 DIAGNOSIS — I071 Rheumatic tricuspid insufficiency: Secondary | ICD-10-CM | POA: Insufficient documentation

## 2015-05-07 DIAGNOSIS — R0609 Other forms of dyspnea: Secondary | ICD-10-CM

## 2015-05-07 DIAGNOSIS — R072 Precordial pain: Secondary | ICD-10-CM

## 2015-05-07 DIAGNOSIS — R079 Chest pain, unspecified: Secondary | ICD-10-CM | POA: Diagnosis present

## 2015-05-07 HISTORY — DX: Systemic involvement of connective tissue, unspecified: M35.9

## 2015-05-07 LAB — NM MYOCAR MULTI W/SPECT W/WALL MOTION / EF
CHL CUP NUCLEAR SRS: 4
CHL CUP NUCLEAR SSS: 5
CHL CUP RESTING HR STRESS: 58 {beats}/min
CSEPED: 6 min
Estimated workload: 7 METS
Exercise duration (sec): 23 s
LV dias vol: 46 mL
LV sys vol: 16 mL
MPHR: 148 {beats}/min
Peak HR: 131 {beats}/min
Percent HR: 88 %
RATE: 0.38
RPE: 13
SDS: 1
TID: 0.87

## 2015-05-07 MED ORDER — SODIUM CHLORIDE 0.9 % IJ SOLN
INTRAMUSCULAR | Status: AC
  Administered 2015-05-07: 10 mL via INTRAVENOUS
  Filled 2015-05-07: qty 3

## 2015-05-07 MED ORDER — TECHNETIUM TC 99M SESTAMIBI - CARDIOLITE
10.0000 | Freq: Once | INTRAVENOUS | Status: AC | PRN
  Administered 2015-05-07: 9 via INTRAVENOUS

## 2015-05-07 MED ORDER — REGADENOSON 0.4 MG/5ML IV SOLN
INTRAVENOUS | Status: AC
  Filled 2015-05-07: qty 5

## 2015-05-07 MED ORDER — TECHNETIUM TC 99M SESTAMIBI GENERIC - CARDIOLITE
30.0000 | Freq: Once | INTRAVENOUS | Status: AC | PRN
  Administered 2015-05-07: 29.2 via INTRAVENOUS

## 2015-05-13 ENCOUNTER — Other Ambulatory Visit: Payer: Self-pay

## 2015-05-14 DIAGNOSIS — R2232 Localized swelling, mass and lump, left upper limb: Secondary | ICD-10-CM | POA: Diagnosis not present

## 2015-05-18 ENCOUNTER — Telehealth: Payer: Self-pay | Admitting: Family Medicine

## 2015-05-18 NOTE — Telephone Encounter (Signed)
Pt states that her Hair is falling out, she feels it is due to the BP med you put her on  triamterene-hydrochlorothiazide (DYAZIDE) 50-25 MG per capsule  She wants a new script or anything else that will not make her hair fall out, lower dose  What ever you would advise   [place] reids

## 2015-05-18 NOTE — Telephone Encounter (Signed)
Pt notified and transferred to front to schedule office visit

## 2015-05-18 NOTE — Telephone Encounter (Signed)
Pt states she is almost bald and her hair did not fall out until she started taking this med.

## 2015-05-18 NOTE — Telephone Encounter (Signed)
LMRC

## 2015-05-18 NOTE — Telephone Encounter (Signed)
Complicated because of recent multiple symptoms, cardiology workup, and ongoing compexity needs o v with scott cannot chang e over phone based on hair loss perceptions

## 2015-05-19 ENCOUNTER — Other Ambulatory Visit: Payer: Self-pay | Admitting: *Deleted

## 2015-05-19 ENCOUNTER — Telehealth: Payer: Self-pay | Admitting: Family Medicine

## 2015-05-19 MED ORDER — POTASSIUM CHLORIDE ER 10 MEQ PO TBCR: 10.0000 meq | EXTENDED_RELEASE_TABLET | Freq: Two times a day (BID) | ORAL | Status: DC

## 2015-05-19 MED ORDER — INDAPAMIDE 2.5 MG PO TABS: 2.5000 mg | ORAL_TABLET | Freq: Every day | ORAL | Status: DC

## 2015-05-19 NOTE — Telephone Encounter (Signed)
LMRC

## 2015-05-19 NOTE — Telephone Encounter (Signed)
Discussed with pt. meds sent to pharm. Pt has f/u in oct. Pt to call sooner if any issues.

## 2015-05-19 NOTE — Telephone Encounter (Signed)
Pt called wanting to talk to Melissa Barajas about her appt tomorrow. Pt wants to ask her some questions before keeping her appt tomorrow.

## 2015-05-19 NOTE — Telephone Encounter (Signed)
See phone note from yesterday. Pt advised yesterday that she needed ov for bp. Pt thinks bp med is making hair fall out. Dr Richardson Landry recommend that she have appt tomorrow. Pt does not want to come in tomorrow. Wants to have med changed over the phone. Please advise

## 2015-05-19 NOTE — Telephone Encounter (Signed)
Unlikely that this is causing hair loss but can stop Dyazide, use Lozol 2.5 mg one qam and KCl 10 meq one bid, 30 day script of each and 4 rf rec follow up ov in 3 to 6 weeks to check bp , sooner if issues

## 2015-05-20 ENCOUNTER — Ambulatory Visit: Payer: Medicare Other | Admitting: Family Medicine

## 2015-05-26 ENCOUNTER — Telehealth: Payer: Self-pay

## 2015-05-26 NOTE — Telephone Encounter (Signed)
Pt is having surgery on her arm and she will call us back after all that is taking care of to set up her EGD.

## 2015-05-28 NOTE — Telephone Encounter (Signed)
REVIEWED-NO ADDITIONAL RECOMMENDATIONS. 

## 2015-06-08 DIAGNOSIS — Z886 Allergy status to analgesic agent status: Secondary | ICD-10-CM | POA: Diagnosis not present

## 2015-06-08 DIAGNOSIS — R2232 Localized swelling, mass and lump, left upper limb: Secondary | ICD-10-CM | POA: Diagnosis not present

## 2015-06-08 DIAGNOSIS — I071 Rheumatic tricuspid insufficiency: Secondary | ICD-10-CM | POA: Diagnosis not present

## 2015-06-08 DIAGNOSIS — M7989 Other specified soft tissue disorders: Secondary | ICD-10-CM | POA: Diagnosis not present

## 2015-06-08 DIAGNOSIS — E780 Pure hypercholesterolemia: Secondary | ICD-10-CM | POA: Diagnosis not present

## 2015-06-08 DIAGNOSIS — G4700 Insomnia, unspecified: Secondary | ICD-10-CM | POA: Diagnosis not present

## 2015-06-08 DIAGNOSIS — I10 Essential (primary) hypertension: Secondary | ICD-10-CM | POA: Diagnosis not present

## 2015-06-08 DIAGNOSIS — D1722 Benign lipomatous neoplasm of skin and subcutaneous tissue of left arm: Secondary | ICD-10-CM | POA: Diagnosis not present

## 2015-06-08 DIAGNOSIS — Z79899 Other long term (current) drug therapy: Secondary | ICD-10-CM | POA: Diagnosis not present

## 2015-06-08 DIAGNOSIS — D481 Neoplasm of uncertain behavior of connective and other soft tissue: Secondary | ICD-10-CM | POA: Diagnosis not present

## 2015-06-15 ENCOUNTER — Encounter: Payer: Self-pay | Admitting: Family Medicine

## 2015-06-15 ENCOUNTER — Ambulatory Visit (INDEPENDENT_AMBULATORY_CARE_PROVIDER_SITE_OTHER): Payer: Medicare Other | Admitting: Family Medicine

## 2015-06-15 VITALS — BP 130/84 | Ht 66.0 in

## 2015-06-15 DIAGNOSIS — E038 Other specified hypothyroidism: Secondary | ICD-10-CM

## 2015-06-15 DIAGNOSIS — Z23 Encounter for immunization: Secondary | ICD-10-CM

## 2015-06-15 DIAGNOSIS — R7303 Prediabetes: Secondary | ICD-10-CM | POA: Diagnosis not present

## 2015-06-15 DIAGNOSIS — R739 Hyperglycemia, unspecified: Secondary | ICD-10-CM | POA: Diagnosis not present

## 2015-06-15 DIAGNOSIS — I10 Essential (primary) hypertension: Secondary | ICD-10-CM

## 2015-06-15 NOTE — Progress Notes (Signed)
   Subjective:    Patient ID: Melissa Barajas, female    DOB: 1941-04-11, 73 y.o.   MRN: [id number]  Hypertension This is a chronic problem. The current episode started more than 1 year ago. Pertinent negatives include no chest pain. Risk factors for coronary artery disease include dyslipidemia and post-menopausal state. Treatments tried: cartia and lozal. There are no compliance problems.    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 I believe patient has only had partial thyroid issue we will check lab work she denies any fatigue tiredness  States that she watches starches in diet try to stay physically active. Denies excessive thirst or urination   Review of Systems  Constitutional: Negative for activity change, appetite change and fatigue.  HENT: Negative for congestion.   Respiratory: Negative for cough.   Cardiovascular: Negative for chest pain.  Gastrointestinal: Negative for abdominal pain.  Endocrine: Negative for polydipsia and polyphagia.  Neurological: Negative for weakness.  Psychiatric/Behavioral: Negative for confusion.       Objective:   Physical Exam  Constitutional: She appears well-nourished. No distress.  Cardiovascular: Normal rate, regular rhythm and normal heart sounds.   No murmur heard. Pulmonary/Chest: Effort normal and breath sounds normal. No respiratory distress.  Musculoskeletal: She exhibits no edema.  Lymphadenopathy:    She has no cervical adenopathy.  Neurological: She is alert. She exhibits normal muscle tone.  Psychiatric: Her behavior is normal.  Vitals reviewed.         Assessment & Plan:  Prediabetes check A1c History hypothyroidism check T4 TSH Blood pressure good control continue current measures Hyperlipidemia lipid profile previous lab work looked good no need to repeat currently Flu vaccine today Follow-up again in approximately 4-5 months See cardiology  as planned

## 2015-06-16 LAB — T4, FREE: FREE T4: 1.38 ng/dL (ref 0.82–1.77)

## 2015-06-16 LAB — HEMOGLOBIN A1C
ESTIMATED AVERAGE GLUCOSE: 134 mg/dL
Hgb A1c MFr Bld: 6.3 % — ABNORMAL HIGH (ref 4.8–5.6)

## 2015-06-16 LAB — TSH: TSH: 0.836 u[IU]/mL (ref 0.450–4.500)

## 2015-06-18 ENCOUNTER — Ambulatory Visit (INDEPENDENT_AMBULATORY_CARE_PROVIDER_SITE_OTHER): Payer: Medicare Other | Admitting: Cardiovascular Disease

## 2015-06-18 ENCOUNTER — Encounter: Payer: Self-pay | Admitting: Cardiovascular Disease

## 2015-06-18 VITALS — BP 128/78 | HR 69 | Ht 66.0 in | Wt 190.0 lb

## 2015-06-18 DIAGNOSIS — I10 Essential (primary) hypertension: Secondary | ICD-10-CM

## 2015-06-18 DIAGNOSIS — R5383 Other fatigue: Secondary | ICD-10-CM

## 2015-06-18 DIAGNOSIS — R0609 Other forms of dyspnea: Secondary | ICD-10-CM | POA: Diagnosis not present

## 2015-06-18 DIAGNOSIS — E785 Hyperlipidemia, unspecified: Secondary | ICD-10-CM

## 2015-06-18 DIAGNOSIS — R635 Abnormal weight gain: Secondary | ICD-10-CM

## 2015-06-18 NOTE — Progress Notes (Signed)
Patient ID: Melissa Barajas, female   DOB: 1940/09/29, 74 y.o.   MRN: [id number]      SUBJECTIVE: The patient returns for follow-up after undergoing cardiovascular testing performed for the evaluation of exertional dyspnea and fatigue. Nuclear stress testing on 05/07/15 was low risk with soft tissue attenuation artifact noted. There were no diagnostic ST segment abnormalities with a low risk Duke treadmill score of 6.5. Calculated LVEF 65%.  Echocardiogram on 05/07/15 demonstrated normal left ventricular systolic function, EF 45-80%, mild LVH, grade 1 diastolic dysfunction, and mildly elevated pulmonary pressures, 33 mmHg.  She tells me she becomes more short of breath when she puts on weight and has done so over the past few months. She said her diet hasn't changed and was told her thyroid function is normal by her PCP. She denies chest pain.    Review of Systems: As per "subjective", otherwise negative.  Allergies  Allergen Reactions  . Hydrocodone Nausea Only  . Lisinopril     Angio edema  . Vioxx [Rofecoxib]     Current Outpatient Prescriptions  Medication Sig Dispense Refill  . Biotin (BIOTIN MAXIMUM STRENGTH) 10 MG TABS Take by mouth.    . Calcium Carbonate-Vitamin D (CALTRATE 600+D PO) Take 1 tablet by mouth daily.     Marland Kitchen CARTIA XT 300 MG 24 hr capsule TAKE 1 CAPSULE BY MOUTH EVERY DAY 90 capsule 1  . indapamide (LOZOL) 2.5 MG tablet Take 1 tablet (2.5 mg total) by mouth daily. 30 tablet 3  . naproxen (NAPROSYN) 375 MG tablet TAKE 1 TABLET BY MOUTH TWICE DAILY. STOP DICLOFENAC 60 tablet 0  . OMEGA FATTY ACIDS-VITAMINS PO Take by mouth.    . Omeprazole Magnesium 20.6 (20 BASE) MG CPDR Take by mouth.    Marland Kitchen OVER THE COUNTER MEDICATION Focus factor    . OVER THE COUNTER MEDICATION Strong Heart    . OVER THE COUNTER MEDICATION Hair, skin and nails    . potassium chloride (K-DUR) 10 MEQ tablet Take 1 tablet (10 mEq total) by mouth 2 (two) times daily. 60 tablet 3  . pravastatin  (PRAVACHOL) 80 MG tablet TAKE 1 TABLET BY MOUTH EVERY DAY 90 tablet 1  . Probiotic Product (PROBIOTIC DAILY PO) Take by mouth.    . temazepam (RESTORIL) 30 MG capsule Take 1 capsule (30 mg total) by mouth at bedtime. 90 capsule 1   No current facility-administered medications for this visit.    Past Medical History  Diagnosis Date  . Hypertension   . GERD (gastroesophageal reflux disease)   . Hypercholesteremia   . Atrial fibrillation ([employer])   . Arthritis   . Lipoma of arm     Left  . DIVERTICULOSIS OF COLON 08/25/2010    Qualifier: Diagnosis of  By: Hoy Morn    . HEMORRHOIDS, INTERNAL 08/25/2010    Qualifier: Diagnosis of  By: Hoy Morn    . Collagen vascular disease [facility])     Past Surgical History  Procedure Laterality Date  . 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 Thyroidectomy    . Cystectomy      Left breast  . Tumor   05/2010    Left breast under nipple  . Esophagogastroduodenoscopy      [id number] appearing Schatzki's ring, possible cervical esophageal web, both dilated with passage of a Maloney dilator/small hiatal hernia  . Abdominal hysterectomy      Partial  . Colonoscopy      Social History  Social History  . Marital Status: Married    Spouse Name: N/A  . Number of Children: N/A  . Years of Education: N/A   Occupational History  . Not on file.   Social History Main Topics  . Smoking status: Former Smoker    Start date: 09/12/1983    Quit date: 09/11/1993  . Smokeless tobacco: Not on file  . Alcohol Use: No  . Drug Use: No  . Sexual Activity: Not on file   Other Topics Concern  . Not on file   Social History Narrative     Filed Vitals:   06/18/15 0817  BP: 128/78  Pulse: 69  Height: 5\' 6"  (1.676 m)  Weight: 190 lb (86.183 kg)  SpO2: 95%    PHYSICAL EXAM General: NAD HEENT: Normal. Neck: No JVD, no thyromegaly. Lungs: Clear to auscultation bilaterally with normal respiratory effort. CV:  Nondisplaced PMI.  Regular rate and rhythm, normal S1/S2, no S3/S4, no murmur. No pretibial or periankle edema.  No carotid bruit.   Abdomen: Soft, nontender, obese.  Neurologic: Alert and oriented x 3.  Psych: Normal affect. Skin: Normal. Musculoskeletal: Normal range of motion, no gross deformities. Extremities: No clubbing or cyanosis.   ECG: Most recent ECG reviewed.      ASSESSMENT AND PLAN: 1. Exertional dyspnea, wheezing, and premature fatigue: Chest xray normal. ECG nonspecific. Echocardiogram and exercise Cardiolite stress test did not demonstrate abnormal LV systolic function nor myocardial ischemia, respectively. Symptoms may be attributable to weight gain. Will defer to PCP for further evaluation.  2. Essential HTN: Well controlled. No changes to therapy.  3. Hyperlipidemia: Lipids on 01/15/15 showed total cholesterol 159, triglycerides 85, HDL 45, LDL 97. Continue pravastatin.  Dispo: f/u prn.  Kate Sable, M.D., F.A.C.C.

## 2015-06-18 NOTE — Patient Instructions (Signed)
Your physician recommends that you schedule a follow-up appointment in:  As needed        Thank you for choosing [facility] !         

## 2015-06-21 DIAGNOSIS — Z48817 Encounter for surgical aftercare following surgery on the skin and subcutaneous tissue: Secondary | ICD-10-CM | POA: Diagnosis not present

## 2015-06-22 ENCOUNTER — Other Ambulatory Visit: Payer: Self-pay | Admitting: Family Medicine

## 2015-06-24 DIAGNOSIS — R2232 Localized swelling, mass and lump, left upper limb: Secondary | ICD-10-CM | POA: Diagnosis not present

## 2015-06-28 ENCOUNTER — Other Ambulatory Visit: Payer: Self-pay | Admitting: Family Medicine

## 2015-07-06 ENCOUNTER — Other Ambulatory Visit: Payer: Self-pay | Admitting: Family Medicine

## 2015-07-06 NOTE — Telephone Encounter (Signed)
She may have this +5 refills 

## 2015-07-23 DIAGNOSIS — R2232 Localized swelling, mass and lump, left upper limb: Secondary | ICD-10-CM | POA: Diagnosis not present

## 2015-07-26 ENCOUNTER — Encounter: Payer: Self-pay | Admitting: Gastroenterology

## 2015-07-28 ENCOUNTER — Other Ambulatory Visit: Payer: Self-pay | Admitting: Family Medicine

## 2015-09-03 ENCOUNTER — Other Ambulatory Visit: Payer: Self-pay | Admitting: Family Medicine

## 2015-09-16 DIAGNOSIS — H04123 Dry eye syndrome of bilateral lacrimal glands: Secondary | ICD-10-CM | POA: Diagnosis not present

## 2015-09-18 ENCOUNTER — Other Ambulatory Visit: Payer: Self-pay | Admitting: Family Medicine

## 2015-09-21 ENCOUNTER — Other Ambulatory Visit: Payer: Self-pay | Admitting: Family Medicine

## 2015-10-06 ENCOUNTER — Other Ambulatory Visit: Payer: Self-pay | Admitting: Gastroenterology

## 2015-10-06 ENCOUNTER — Other Ambulatory Visit: Payer: Self-pay

## 2015-10-06 ENCOUNTER — Ambulatory Visit (INDEPENDENT_AMBULATORY_CARE_PROVIDER_SITE_OTHER): Payer: Medicare Other | Admitting: Gastroenterology

## 2015-10-06 ENCOUNTER — Encounter: Payer: Self-pay | Admitting: Gastroenterology

## 2015-10-06 VITALS — BP 145/69 | HR 71 | Temp 97.9°F | Ht 66.0 in | Wt 184.8 lb

## 2015-10-06 DIAGNOSIS — R4702 Dysphasia: Secondary | ICD-10-CM

## 2015-10-06 DIAGNOSIS — K219 Gastro-esophageal reflux disease without esophagitis: Secondary | ICD-10-CM

## 2015-10-06 DIAGNOSIS — K30 Functional dyspepsia: Secondary | ICD-10-CM

## 2015-10-06 DIAGNOSIS — R1310 Dysphagia, unspecified: Secondary | ICD-10-CM

## 2015-10-06 DIAGNOSIS — R1013 Epigastric pain: Secondary | ICD-10-CM

## 2015-10-06 MED ORDER — LINACLOTIDE 145 MCG PO CAPS: ORAL_CAPSULE | ORAL | Status: DC

## 2015-10-06 NOTE — Assessment & Plan Note (Addendum)
ASSOCIATED WITH PILL DYSPAGIA AND MAYBE HARD/BULKY FOODS. SYMPTOMS NOT IDEALLY CONTROLLED.   DRINK WATER TO KEEP YOUR URINE LIGHT YELLOW. CONTINUE TO EAT FIBER. COMPLETE MODIFIED BARIUM SWALLOW. UPPER ENDOSCOPY. CONTINUE LACTASE PILLS. ADD LINZESS 30 MINS PRIOR TO BREAKFAST. IT MAY CAUSE EXPLOSIVE DIARRHEA. IF IT CAUSES EXPLOSIVE WATERY DIARRHEA, OPEN LINZESS CAPSULE. PLACE IN 4 TBSP WATER. STIR IT FOR ONE MINUTE AND TAKE 2 TBSP LIQUID. Upper endoscopy/POSSIBLE DIL IN 2-3 WEEKS. DISCUSSED PROCEDURE, BENEFITS, & RISKS: < 1% chance of medication reaction, OR bleeding. FOLLOW UP IN 4 MOS.

## 2015-10-06 NOTE — Assessment & Plan Note (Signed)
SYMPTOMS CONTROLLED/RESOLVED.  CONTINUE TO MONITOR SYMPTOMS. CONTINUE YOUR WEIGHT LOSS EFFORTS. FOLLOW UP IN 6 MOS.

## 2015-10-06 NOTE — Progress Notes (Signed)
CC'ED TO PCP 

## 2015-10-06 NOTE — Progress Notes (Signed)
ON RECALL  °

## 2015-10-06 NOTE — Progress Notes (Signed)
Subjective:    Patient ID: Melissa Barajas, female    DOB: [birth year], 75 y.o.   MRN: [id number]  Sallee Lange, MD   HPI TROUBLE WITH BOWELS. RAISIN BRAN OR HONEY BUNCHES OF OATS IN THE AM. LUNCH: NOT MUCH CAUSE ON THE GO-GB/MAC/CHEESE/ROAST FOR BOTH. EATS TWICE A DAY. BOWELS MOVE 1-2X/DAY IN TH AM OR EVENING. STRAINING. TRIED: MIRALAX, SENNA-KOT. THEY WORK. TRIED TO LOSE WEIGHT. WEIGHT DOWN 6 LBS. WAVES OF NAUSEA OCCASIONALLY. OCCASIONAL LEFT LOWER QUADRANT PAIN. PT DENIES FEVER, CHILLS, HEMATOCHEZIA, nausea, vomiting, melena, diarrhea, CHEST PAIN, SHORTNESS OF BREATH,  CHANGE IN BOWEL IN HABITS, constipation, abdominal pain, problems swallowing, OR heartburn or indigestion.   Past Medical History  Diagnosis Date  . Hypertension   . GERD (gastroesophageal reflux disease)   . Hypercholesteremia   . Atrial fibrillation ([place])   . Arthritis   . Lipoma of arm     Left  . DIVERTICULOSIS OF COLON 08/25/2010    Qualifier: Diagnosis of  By: Hoy Morn    . HEMORRHOIDS, INTERNAL 08/25/2010    Qualifier: Diagnosis of  By: Hoy Morn    . Collagen vascular disease [facility])     Past Surgical History  Procedure Laterality Date  . 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 Thyroidectomy    . Cystectomy      Left breast  . Tumor   05/2010    Left breast under nipple  . Esophagogastroduodenoscopy      [id number] appearing Schatzki's ring, possible cervical esophageal web, both dilated with passage of a Maloney dilator/small hiatal hernia  . Abdominal hysterectomy      Partial  . Colonoscopy  2009    SLF: 1. screening colonoscopy in 10 years 2. She should follow a high fiber diet She has been given a hand out on high fiber diverticulosois and hemorrhoids    Allergies  Allergen Reactions  . Hydrocodone Nausea Only  . Lisinopril     Angio edema  . Vioxx [Rofecoxib]    Current Outpatient Prescriptions  Medication Sig Dispense Refill  . Biotin (BIOTIN  MAXIMUM STRENGTH) 10 MG TABS Take by mouth.    . Calcium Carbonate-Vitamin D (CALTRATE 600+D PO) Take 1 tablet by mouth daily.     Marland Kitchen CARTIA XT 300 MG 24 hr capsule TAKE 1 CAPSULE BY MOUTH EVERY DAY    . indapamide (LOZOL) 2.5 MG tablet TAKE 1 TABLET(2.5 MG) BY MOUTH DAILY    . naproxen (NAPROSYN) 375 MG tablet TAKE 1 TABLET BY MOUTH TWICE DAILY. STOP DICLOFENAC    . OMEGA FATTY ACIDS-VITAMINS PO Take by mouth.    . Omeprazole Magnesium 20.6 (20 BASE) MG CPDR Take by mouth DAILY AT BEDTIME    . OVER THE COUNTER MEDICATION Focus factor    . OVER THE COUNTER MEDICATION Strong Heart    . OVER THE COUNTER MEDICATION Hair, skin and nails    . potassium chloride (K-DUR) 10 MEQ tablet Take 1 tablet (10 mEq total) by mouth 2 (two) times daily.    . pravastatin (PRAVACHOL) 80 MG tablet TAKE 1 TABLET BY MOUTH EVERY DAY    . Probiotic Product (PROBIOTIC DAILY PO) Take by mouth.    . temazepam (RESTORIL) 30 MG capsule TAKE 1 CAPSULE BY MOUTH EVERY DAY AT BEDTIME      Review of Systems PER HPI OTHERWISE ALL SYSTEMS ARE NEGATIVE.    Objective:   Physical Exam  Constitutional: She is oriented to person,  place, and time. She appears well-developed and well-nourished. No distress.  HENT:  Head: Normocephalic and atraumatic.  Mouth/Throat: Oropharynx is clear and moist. No oropharyngeal exudate.  Eyes: Pupils are equal, round, and reactive to light. No scleral icterus.  Neck: Normal range of motion. Neck supple.  Cardiovascular: Normal rate, regular rhythm and normal heart sounds.   Pulmonary/Chest: Effort normal and breath sounds normal. No respiratory distress.  Abdominal: Soft. Bowel sounds are normal. She exhibits no distension. There is no tenderness.  Musculoskeletal: She exhibits no edema.  Lymphadenopathy:    She has no cervical adenopathy.  Neurological: She is alert and oriented to person, place, and time.  NO FOCAL DEFICITS  Psychiatric: She has a normal mood and affect.  Vitals  reviewed.     Assessment & Plan:

## 2015-10-06 NOTE — Patient Instructions (Addendum)
DRINK WATER TO KEEP YOUR URINE LIGHT YELLOW.  CONTINUE TO EAT FIBER.  COMPLETE MODIFIED BARIUM SWALLOW. UPPER ENDOSCOPY. CONTINUE LACTASE PILLS.  ADD LINZESS 30 MINS PRIOR TO BREAKFAST. IT MAY CAUSE EXPLOSIVE DIARRHEA. IF IT CAUSES EXPLOSIVE WATERY DIARRHEA, OPEN LINZESS CAPSULE. PLACE IN 4 TBSP WATER. STIR IT FOR ONE MINUTE AND TAKE 2 TBSP LIQUID.  FOLLOW UP IN 4 MOS.

## 2015-10-13 ENCOUNTER — Ambulatory Visit (HOSPITAL_COMMUNITY): Payer: Medicare Other | Attending: Gastroenterology | Admitting: Speech Pathology

## 2015-10-13 ENCOUNTER — Ambulatory Visit (HOSPITAL_COMMUNITY)
Admission: RE | Admit: 2015-10-13 | Discharge: 2015-10-13 | Disposition: A | Discharge status: Home / Self Care | Payer: Medicare Other | Source: Ambulatory Visit | Attending: Gastroenterology | Admitting: Gastroenterology

## 2015-10-13 DIAGNOSIS — I10 Essential (primary) hypertension: Secondary | ICD-10-CM | POA: Insufficient documentation

## 2015-10-13 DIAGNOSIS — Z8719 Personal history of other diseases of the digestive system: Secondary | ICD-10-CM | POA: Diagnosis not present

## 2015-10-13 DIAGNOSIS — R1319 Other dysphagia: Secondary | ICD-10-CM | POA: Insufficient documentation

## 2015-10-13 DIAGNOSIS — K219 Gastro-esophageal reflux disease without esophagitis: Secondary | ICD-10-CM | POA: Insufficient documentation

## 2015-10-13 DIAGNOSIS — R4702 Dysphasia: Secondary | ICD-10-CM

## 2015-10-13 DIAGNOSIS — E7800 Pure hypercholesterolemia, unspecified: Secondary | ICD-10-CM | POA: Insufficient documentation

## 2015-10-13 DIAGNOSIS — R1310 Dysphagia, unspecified: Secondary | ICD-10-CM | POA: Diagnosis not present

## 2015-10-13 DIAGNOSIS — I4891 Unspecified atrial fibrillation: Secondary | ICD-10-CM | POA: Insufficient documentation

## 2015-10-13 NOTE — Therapy (Signed)
[place], [place], [address] Phone: [phone]   Fax:  [phone]  Modified Barium Swallow  Patient Details  Name: Melissa Barajas MRN: [id number] Date of Birth: [birth year] No Data Recorded  Encounter Date: 10/13/2015      End of Session - 10/13/15 1512    Visit Number 1   Number of Visits 1   Authorization Type Medicare   SLP Start Time 1310   SLP Stop Time  1342   SLP Time Calculation (min) 32 min   Activity Tolerance Patient tolerated treatment well      Past Medical History  Diagnosis Date  . Hypertension   . GERD (gastroesophageal reflux disease)   . Hypercholesteremia   . Atrial fibrillation ([place])   . Arthritis   . Lipoma of arm     Left  . DIVERTICULOSIS OF COLON 08/25/2010    Qualifier: Diagnosis of  By: Hoy Morn    . HEMORRHOIDS, INTERNAL 08/25/2010    Qualifier: Diagnosis of  By: Hoy Morn    . Collagen vascular disease [facility])     Past Surgical History  Procedure Laterality Date  . 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 Thyroidectomy    . Cystectomy      Left breast  . Tumor   05/2010    Left breast under nipple  . Esophagogastroduodenoscopy      [id number] appearing Schatzki's ring, possible cervical esophageal web, both dilated with passage of a Maloney dilator/small hiatal hernia  . Abdominal hysterectomy      Partial  . Colonoscopy  2009    SLF: 1. screening colonoscopy in 10 years 2. She should follow a high fiber diet She has been given a hand out on high fiber diverticulosois and hemorrhoids    There were no vitals filed for this visit.  Visit Diagnosis: Dysphagia      Subjective Assessment - 10/13/15 1437    Subjective "I have trouble swallowing popcorn, nuts, cornbread, pills, and crackers."   Special Tests MBSS   Currently in Pain? No/denies             General - 10/13/15 1440    General Information   Date of Onset  09/12/15   HPI Melissa Barajas is a 75 yo woman who was referred by Dr. Oneida Alar for MBSS due to pt with subjective complaint of difficulty swallowing nuts, popcorn, and other dry foods. Endoscopy planned for mid February. Pt tells SLP that she has had trouble with the above foods for many years and states, "It feels like they get hung". Pt had thyroidectomy in the 1990s. Pt had EGD in 2011 with dilation for noncritical Schatzki's ring and possible cervical esophageal web.    Type of Study MBS-Modified Barium Swallow Study   Previous Swallow Assessment EGD 2011 with dilation   Diet Prior to this Study Regular;Thin liquids   Temperature Spikes Noted No   Respiratory Status Room air   History of Recent Intubation No   Behavior/Cognition Alert   Oral Cavity Assessment Within Functional Limits   Oral Care Completed by SLP No   Oral Cavity - Dentition Adequate natural dentition   Vision Functional for self feeding   Self-Feeding Abilities Able to feed self   Patient Positioning Upright in chair   Baseline Vocal Quality Normal   Volitional Cough Strong   Volitional Swallow Able to elicit   Anatomy Within functional limits  evidence of thyroidectomy; cervical bone spurs   Pharyngeal Secretions Not observed secondary MBS            Oral Preparation/Oral Phase - 2015-10-16 1441    Oral Preparation/Oral Phase   Oral Phase Within functional limits   Electrical stimulation - Oral Phase   Was Electrical Stimulation Used No          Pharyngeal Phase - 16-Oct-2015 1441    Pharyngeal Phase   Pharyngeal Phase Within functional limits   Pharyngeal - Thin   Pharyngeal- Thin Cup Within functional limits   Pharyngeal- Thin Straw Swallow initiation at pyriform sinus   Pharyngeal - Solids   Pharyngeal- Puree Within functional limits   Pharyngeal- Regular Within functional limits   Pharyngeal- Pill Within functional limits   Electrical Stimulation - Pharyngeal Phase   Was Electrical Stimulation Used  No          Cricopharyngeal Phase - 10/16/15 1446    Cervical Esophageal Phase   Cervical Esophageal Phase Impaired   Cervical Esophageal Phase - Solids   Pill Other (Comment)  stasis of pill    Cervical Esophageal Phase - Comment   Cervical Esophageal Comment bone spurs noted and min stasis of pill which cleared after liquid wash             Plan - 16-Oct-2015 1512    Clinical Impression Statement Pt presents with normal oropharyngeal swallow during today's assessment. Pt presented with barium tinged thin liquids, applesauce, crackers, and barium tablet. Pt noted to have thin liquids spill to fill the pyriforms when taking sequential straw sips of thin barium. No penetration or aspiration observed. Transient delay of the barium tablet at the C6-C7 level of the proximal esophagus though was able to pass beyond and although way to the stomach without obstruction.   Consulted and Agree with Plan of Care Patient          G-Codes - 2015-10-16 1517    Functional Assessment Tool Used clinical judgment/ mbss   Functional Limitations Swallowing   Swallow Current Status [id number]) 0 percent impaired, limited or restricted   Swallow Goal Status [id number]) 0 percent impaired, limited or restricted   Swallow Discharge Status [phone]) 0 percent impaired, limited or restricted          Recommendations/Treatment - 2015-10-16 1459    Swallow Evaluation Recommendations   SLP Diet Recommendations Age appropriate regular;Thin   Liquid Administration via Cup;Straw   Medication Administration Whole meds with liquid   Supervision Patient able to self feed   Compensations Small sips/bites;Slow rate;Follow solids with liquid   Postural Changes Seated upright at 90 degrees;Remain upright for at least 30 minutes after feeds/meals          Prognosis - Oct 16, 2015 1512    Prognosis   Prognosis for Safe Diet Advancement Good   Individuals Consulted   Consulted and Agree with Results and Recommendations  Patient   Report Sent to  Referring physician      Problem List Patient Active Problem List   Diagnosis Date Noted  . Hyperglycemia 15-Oct-2014  . Ganglion cyst 09/26/2013  . 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 Urge incontinence 03/25/2013  . RUQ abdominal pain 11/20/2012  . Constipation - functional 11/20/2012  . Non-ulcer dyspepsia 08/30/2010  . Hyperlipidemia 08/25/2010  . Essential hypertension 08/25/2010  . GERD 08/25/2010   Thank you,  Genene Churn, [place] 2015-10-16, 3:18 PM  [address]  [place], [place], [address] Phone: [phone]   Fax:  [phone]  Name: Melissa Barajas MRN: [id number] Date of Birth: [birth year]

## 2015-10-18 ENCOUNTER — Ambulatory Visit (INDEPENDENT_AMBULATORY_CARE_PROVIDER_SITE_OTHER): Payer: Medicare Other | Admitting: Family Medicine

## 2015-10-18 ENCOUNTER — Encounter: Payer: Self-pay | Admitting: Family Medicine

## 2015-10-18 VITALS — BP 122/80 | Ht 66.0 in | Wt 183.5 lb

## 2015-10-18 DIAGNOSIS — E785 Hyperlipidemia, unspecified: Secondary | ICD-10-CM

## 2015-10-18 DIAGNOSIS — Z13820 Encounter for screening for osteoporosis: Secondary | ICD-10-CM | POA: Diagnosis not present

## 2015-10-18 DIAGNOSIS — R739 Hyperglycemia, unspecified: Secondary | ICD-10-CM

## 2015-10-18 DIAGNOSIS — I10 Essential (primary) hypertension: Secondary | ICD-10-CM

## 2015-10-18 DIAGNOSIS — R062 Wheezing: Secondary | ICD-10-CM

## 2015-10-18 DIAGNOSIS — Z780 Asymptomatic menopausal state: Secondary | ICD-10-CM | POA: Diagnosis not present

## 2015-10-18 NOTE — Progress Notes (Signed)
   Subjective:    Patient ID: Melissa Barajas, female    DOB: 1941/02/10, 75 y.o.   MRN: [id number]  Hypertension This is a chronic problem. The current episode started more than 1 year ago. The problem has been gradually improving since onset. There are no associated agents to hypertension. There are no known risk factors for coronary artery disease. Treatments tried: indapamide. The current treatment provides moderate improvement. There are no compliance problems.    Patient states that she has some wheezing. Onset 2 years ago.   patient denies any daytime sleepiness. Does not snore at nighttime. She denies any chest tightness pressure pain nausea vomiting diarrhea denies any swelling in the legs.  patient does take her medication watches her diet tries keep cholesterol under control Has history of prediabetes hyperglycemia. Awaiting what the A1c shows Because of her medication she does need metabolic 7 check She will also need bone density as well. Review of Systems  she denies any chest tightness pressure pain nausea vomiting diarrhea sweats chills fevers.    Objective:   Physical Exam  lungs clear hearts regular pulse normal extremities no edema skin warm dry I do not find any evidence of asthma.       Assessment & Plan:   multiple health issues Greater than 25 minutes spent with patient discussing these multiple issues greater than half the time spent in discussion  hypertension decent control continue current measures Hyperlipidemia check lab work await results  History hyperglycemia elevated A1c check A1c again Bone density in the kidney Pulmonary function test recommended because of wheezing issues. Could have underlying COPD issues.

## 2015-10-19 ENCOUNTER — Telehealth: Payer: Self-pay | Admitting: Family Medicine

## 2015-10-19 NOTE — Telephone Encounter (Signed)
Eye dr results as per your request, placed in your basket in your office

## 2015-10-20 NOTE — Telephone Encounter (Signed)
The eye results were reviewed

## 2015-10-21 DIAGNOSIS — E785 Hyperlipidemia, unspecified: Secondary | ICD-10-CM | POA: Diagnosis not present

## 2015-10-21 DIAGNOSIS — I10 Essential (primary) hypertension: Secondary | ICD-10-CM | POA: Diagnosis not present

## 2015-10-21 DIAGNOSIS — R739 Hyperglycemia, unspecified: Secondary | ICD-10-CM | POA: Diagnosis not present

## 2015-10-22 ENCOUNTER — Other Ambulatory Visit (HOSPITAL_COMMUNITY): Payer: Medicare Other

## 2015-10-22 LAB — BASIC METABOLIC PANEL
BUN / CREAT RATIO: 15 (ref 11–26)
BUN: 12 mg/dL (ref 8–27)
CALCIUM: 10.2 mg/dL (ref 8.7–10.3)
CHLORIDE: 93 mmol/L — AB (ref 96–106)
CO2: 31 mmol/L — AB (ref 18–29)
CREATININE: 0.81 mg/dL (ref 0.57–1.00)
GFR calc Af Amer: 84 mL/min/{1.73_m2} (ref >59)
GFR calc non Af Amer: 73 mL/min/{1.73_m2} (ref >59)
GLUCOSE: 103 mg/dL — AB (ref 65–99)
Potassium: 3.5 mmol/L (ref 3.5–5.2)
Sodium: 141 mmol/L (ref 134–144)

## 2015-10-22 LAB — HEMOGLOBIN A1C
ESTIMATED AVERAGE GLUCOSE: 117 mg/dL
Hgb A1c MFr Bld: 5.7 % — ABNORMAL HIGH (ref 4.8–5.6)

## 2015-10-22 LAB — HEPATIC FUNCTION PANEL
ALBUMIN: 4.3 g/dL (ref 3.5–4.8)
ALT: 15 IU/L (ref 0–32)
AST: 23 IU/L (ref 0–40)
Alkaline Phosphatase: 74 IU/L (ref 39–117)
BILIRUBIN TOTAL: 0.6 mg/dL (ref 0.0–1.2)
Bilirubin, Direct: 0.18 mg/dL (ref 0.00–0.40)
TOTAL PROTEIN: 7.5 g/dL (ref 6.0–8.5)

## 2015-10-22 LAB — LIPID PANEL
CHOL/HDL RATIO: 3.8 ratio (ref 0.0–4.4)
Cholesterol, Total: 155 mg/dL (ref 100–199)
HDL: 41 mg/dL (ref >39)
LDL CALC: 95 mg/dL (ref 0–99)
Triglycerides: 96 mg/dL (ref 0–149)
VLDL CHOLESTEROL CAL: 19 mg/dL (ref 5–40)

## 2015-10-24 ENCOUNTER — Encounter: Payer: Self-pay | Admitting: Family Medicine

## 2015-10-25 ENCOUNTER — Encounter (HOSPITAL_COMMUNITY)
Admission: RE | Disposition: A | Discharge status: Home / Self Care | Payer: Self-pay | Source: Ambulatory Visit | Attending: Gastroenterology

## 2015-10-25 ENCOUNTER — Encounter (HOSPITAL_COMMUNITY): Payer: Self-pay

## 2015-10-25 ENCOUNTER — Ambulatory Visit (HOSPITAL_COMMUNITY)
Admission: RE | Admit: 2015-10-25 | Discharge: 2015-10-25 | Disposition: A | Discharge status: Home / Self Care | Payer: Medicare Other | Source: Ambulatory Visit | Attending: Gastroenterology | Admitting: Gastroenterology

## 2015-10-25 DIAGNOSIS — I10 Essential (primary) hypertension: Secondary | ICD-10-CM | POA: Insufficient documentation

## 2015-10-25 DIAGNOSIS — E7800 Pure hypercholesterolemia, unspecified: Secondary | ICD-10-CM | POA: Insufficient documentation

## 2015-10-25 DIAGNOSIS — I4891 Unspecified atrial fibrillation: Secondary | ICD-10-CM | POA: Diagnosis not present

## 2015-10-25 DIAGNOSIS — K219 Gastro-esophageal reflux disease without esophagitis: Secondary | ICD-10-CM | POA: Insufficient documentation

## 2015-10-25 DIAGNOSIS — R1310 Dysphagia, unspecified: Secondary | ICD-10-CM

## 2015-10-25 DIAGNOSIS — K2970 Gastritis, unspecified, without bleeding: Secondary | ICD-10-CM | POA: Diagnosis not present

## 2015-10-25 DIAGNOSIS — Z79899 Other long term (current) drug therapy: Secondary | ICD-10-CM | POA: Insufficient documentation

## 2015-10-25 DIAGNOSIS — M1990 Unspecified osteoarthritis, unspecified site: Secondary | ICD-10-CM | POA: Diagnosis not present

## 2015-10-25 DIAGNOSIS — Z791 Long term (current) use of non-steroidal anti-inflammatories (NSAID): Secondary | ICD-10-CM | POA: Insufficient documentation

## 2015-10-25 DIAGNOSIS — R1013 Epigastric pain: Secondary | ICD-10-CM | POA: Insufficient documentation

## 2015-10-25 DIAGNOSIS — K319 Disease of stomach and duodenum, unspecified: Secondary | ICD-10-CM | POA: Insufficient documentation

## 2015-10-25 DIAGNOSIS — K222 Esophageal obstruction: Secondary | ICD-10-CM

## 2015-10-25 DIAGNOSIS — K3189 Other diseases of stomach and duodenum: Secondary | ICD-10-CM | POA: Diagnosis not present

## 2015-10-25 HISTORY — PX: ESOPHAGOGASTRODUODENOSCOPY: SHX5428

## 2015-10-25 HISTORY — PX: SAVORY DILATION: SHX5439

## 2015-10-25 SURGERY — EGD (ESOPHAGOGASTRODUODENOSCOPY)
Anesthesia: Moderate Sedation

## 2015-10-25 MED ORDER — SODIUM CHLORIDE 0.9 % IV SOLN
INTRAVENOUS | Status: DC
  Administered 2015-10-25: 11:00:00 via INTRAVENOUS

## 2015-10-25 MED ORDER — LIDOCAINE VISCOUS 2 % MT SOLN
OROMUCOSAL | Status: DC | PRN
  Administered 2015-10-25: 3 mL via OROMUCOSAL

## 2015-10-25 MED ORDER — LIDOCAINE VISCOUS 2 % MT SOLN
OROMUCOSAL | Status: AC
  Filled 2015-10-25: qty 15

## 2015-10-25 MED ORDER — MIDAZOLAM HCL 5 MG/5ML IJ SOLN
INTRAMUSCULAR | Status: AC
  Filled 2015-10-25: qty 10

## 2015-10-25 MED ORDER — MEPERIDINE HCL 100 MG/ML IJ SOLN
INTRAMUSCULAR | Status: DC | PRN
  Administered 2015-10-25 (×4): 25 mg via INTRAVENOUS

## 2015-10-25 MED ORDER — ONDANSETRON HCL 4 MG/2ML IJ SOLN
4.0000 mg | Freq: Once | INTRAMUSCULAR | Status: AC
  Administered 2015-10-25: 4 mg via INTRAVENOUS

## 2015-10-25 MED ORDER — MINERAL OIL PO OIL
TOPICAL_OIL | ORAL | Status: AC
  Filled 2015-10-25: qty 30

## 2015-10-25 MED ORDER — MEPERIDINE HCL 100 MG/ML IJ SOLN
INTRAMUSCULAR | Status: AC
  Filled 2015-10-25: qty 2

## 2015-10-25 MED ORDER — STERILE WATER FOR IRRIGATION IR SOLN
Status: DC | PRN
  Administered 2015-10-25: 12:00:00

## 2015-10-25 MED ORDER — ONDANSETRON HCL 4 MG/2ML IJ SOLN
INTRAMUSCULAR | Status: AC
  Filled 2015-10-25: qty 2

## 2015-10-25 MED ORDER — MIDAZOLAM HCL 5 MG/5ML IJ SOLN
INTRAMUSCULAR | Status: DC | PRN
  Administered 2015-10-25: 2 mg via INTRAVENOUS
  Administered 2015-10-25: 1 mg via INTRAVENOUS
  Administered 2015-10-25: 2 mg via INTRAVENOUS

## 2015-10-25 MED ORDER — SODIUM CHLORIDE 0.9% FLUSH
INTRAVENOUS | Status: AC
  Filled 2015-10-25: qty 10

## 2015-10-25 NOTE — H&P (View-Only) (Signed)
Subjective:    Patient ID: Melissa Barajas, female    DOB: 01-Oct-1940, 75 y.o.   MRN: [id number]  Sallee Lange, MD   HPI TROUBLE WITH BOWELS. RAISIN BRAN OR HONEY BUNCHES OF OATS IN THE AM. LUNCH: NOT MUCH CAUSE ON THE GO-GB/MAC/CHEESE/ROAST FOR BOTH. EATS TWICE A DAY. BOWELS MOVE 1-2X/DAY IN TH AM OR EVENING. STRAINING. TRIED: MIRALAX, SENNA-KOT. THEY WORK. TRIED TO LOSE WEIGHT. WEIGHT DOWN 6 LBS. WAVES OF NAUSEA OCCASIONALLY. OCCASIONAL LEFT LOWER QUADRANT PAIN. PT DENIES FEVER, CHILLS, HEMATOCHEZIA, nausea, vomiting, melena, diarrhea, CHEST PAIN, SHORTNESS OF BREATH,  CHANGE IN BOWEL IN HABITS, constipation, abdominal pain, problems swallowing, OR heartburn or indigestion.   Past Medical History  Diagnosis Date  . Hypertension   . GERD (gastroesophageal reflux disease)   . Hypercholesteremia   . Atrial fibrillation ([place])   . Arthritis   . Lipoma of arm     Left  . DIVERTICULOSIS OF COLON 08/25/2010    Qualifier: Diagnosis of  By: Hoy Morn    . HEMORRHOIDS, INTERNAL 08/25/2010    Qualifier: Diagnosis of  By: Hoy Morn    . Collagen vascular disease [facility])     Past Surgical History  Procedure Laterality Date  . 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 Thyroidectomy    . Cystectomy      Left breast  . Tumor   05/2010    Left breast under nipple  . Esophagogastroduodenoscopy      [id number] appearing Schatzki's ring, possible cervical esophageal web, both dilated with passage of a Maloney dilator/small hiatal hernia  . Abdominal hysterectomy      Partial  . Colonoscopy  2009    SLF: 1. screening colonoscopy in 10 years 2. She should follow a high fiber diet She has been given a hand out on high fiber diverticulosois and hemorrhoids    Allergies  Allergen Reactions  . Hydrocodone Nausea Only  . Lisinopril     Angio edema  . Vioxx [Rofecoxib]    Current Outpatient Prescriptions  Medication Sig Dispense Refill  . Biotin (BIOTIN  MAXIMUM STRENGTH) 10 MG TABS Take by mouth.    . Calcium Carbonate-Vitamin D (CALTRATE 600+D PO) Take 1 tablet by mouth daily.     Marland Kitchen CARTIA XT 300 MG 24 hr capsule TAKE 1 CAPSULE BY MOUTH EVERY DAY    . indapamide (LOZOL) 2.5 MG tablet TAKE 1 TABLET(2.5 MG) BY MOUTH DAILY    . naproxen (NAPROSYN) 375 MG tablet TAKE 1 TABLET BY MOUTH TWICE DAILY. STOP DICLOFENAC    . OMEGA FATTY ACIDS-VITAMINS PO Take by mouth.    . Omeprazole Magnesium 20.6 (20 BASE) MG CPDR Take by mouth DAILY AT BEDTIME    . OVER THE COUNTER MEDICATION Focus factor    . OVER THE COUNTER MEDICATION Strong Heart    . OVER THE COUNTER MEDICATION Hair, skin and nails    . potassium chloride (K-DUR) 10 MEQ tablet Take 1 tablet (10 mEq total) by mouth 2 (two) times daily.    . pravastatin (PRAVACHOL) 80 MG tablet TAKE 1 TABLET BY MOUTH EVERY DAY    . Probiotic Product (PROBIOTIC DAILY PO) Take by mouth.    . temazepam (RESTORIL) 30 MG capsule TAKE 1 CAPSULE BY MOUTH EVERY DAY AT BEDTIME      Review of Systems PER HPI OTHERWISE ALL SYSTEMS ARE NEGATIVE.    Objective:   Physical Exam  Constitutional: She is oriented to person,  place, and time. She appears well-developed and well-nourished. No distress.  HENT:  Head: Normocephalic and atraumatic.  Mouth/Throat: Oropharynx is clear and moist. No oropharyngeal exudate.  Eyes: Pupils are equal, round, and reactive to light. No scleral icterus.  Neck: Normal range of motion. Neck supple.  Cardiovascular: Normal rate, regular rhythm and normal heart sounds.   Pulmonary/Chest: Effort normal and breath sounds normal. No respiratory distress.  Abdominal: Soft. Bowel sounds are normal. She exhibits no distension. There is no tenderness.  Musculoskeletal: She exhibits no edema.  Lymphadenopathy:    She has no cervical adenopathy.  Neurological: She is alert and oriented to person, place, and time.  NO FOCAL DEFICITS  Psychiatric: She has a normal mood and affect.  Vitals  reviewed.     Assessment & Plan:

## 2015-10-25 NOTE — Op Note (Addendum)
[facility] [address] [place], [address]   ENDOSCOPY PROCEDURE REPORT  PATIENT: Melissa Barajas, Melissa Barajas  MR#: [id number] BIRTHDATE: 1941-02-23 , 74  yrs. old GENDER: female  ENDOSCOPIST: Danie Binder, MD REFFERED [id number] Wolfgang Phoenix, M.D.  PROCEDURE DATE:  11/07/15 PROCEDURE:   EGD with biopsy and EGD with dilatation over guidewire   INDICATIONS:1.  dysphagia. MEDICATIONS: Demerol 100 mg IV and Versed 5 mg IV  MD INITIATED SEDATION: 1213 PROCEDURE COMPLETE: 1237 TOPICAL ANESTHETIC: Viscous Xylocaine  DESCRIPTION OF PROCEDURE:   After the risks benefits and alternatives of the procedure were thoroughly explained, informed consent was obtained.  The [id number]  endoscope was introduced through the mouth and advanced to the second portion of the duodenum. The instrument was slowly withdrawn as the mucosa was carefully examined.  Prior to withdrawal of the scope, the guidwire was placed.  The esophagus was dilated successfully.  The patient was recovered in endoscopy and discharged home in satisfactory condition. Estimated blood loss is zero unless otherwise noted in this procedure report.   ESOPHAGUS: A stricture was found at the gastroesophageal junction. The stenosis was traversable with the endoscope.   STOMACH: Moderate non-erosive gastritis (inflammation) was found in the gastric body and gastric antrum.  Multiple biopsies were performed using cold forceps.   DUODENUM: The duodenal mucosa showed no abnormalities in the bulb and second portion of the duodenum. Dilation was then performed at the gastroesphageal junction Dilator: Savary over guidewire Size(s): 12.8-17 mm Resistance: minimal Heme: yes TRACE  COMPLICATIONS: There were no immediate complications.  ENDOSCOPIC IMPRESSION: 1.   Stricture at the gastroesophageal junction 2.   MODERTAE Non-erosive gastritis  RECOMMENDATIONS: DRINK WATER TO KEEP URINE LIGHT YELLOW. FOLLOW A HIGH FIBER/LOW  FAT DIET. CONTINUE OMEPRAZOLE 30 MINS PRIOR TO FIRST MEAL. AWAIT BIOPSY RESULTS. FOLLOW UP IN MAY 2017.    eSigned:  Danie Binder, MD 07-Nov-2015 7:55 PM Revised: 2015/11/07 7:55 PM  CPT CODES: ICD CODES:  The ICD and CPT codes recommended by this software are interpretations from the data that the clinical staff has captured with the software.  The verification of the translation of this report to the ICD and CPT codes and modifiers is the sole responsibility of the health care institution and practicing physician where this report was generated.  [employer] will not be held responsible for the validity of the ICD and CPT codes included on this report.  AMA assumes no liability for data contained or not contained herein. CPT is a Designer, television/film set of the [employer].

## 2015-10-25 NOTE — Interval H&P Note (Signed)
History and Physical Interval Note:  10/25/2015 12:08 PM  Melissa Barajas  has presented today for surgery, with the diagnosis of dysphagia, dyspepsia  The various methods of treatment have been discussed with the patient and family. After consideration of risks, benefits and other options for treatment, the patient has consented to  Procedure(s) with comments: ESOPHAGOGASTRODUODENOSCOPY (EGD) (N/A) - 1215pm SAVORY DILATION (N/A) as a surgical intervention .  The patient's history has been reviewed, patient examined, no change in status, stable for surgery.  I have reviewed the patient's chart and labs.  Questions were answered to the patient's satisfaction.     [employer]

## 2015-10-25 NOTE — Discharge Instructions (Signed)
I dilated your esophagus. You have a stricture near the base of your esophagus.  You have Gastritis. I biopsied your stomach.   DRINK WATER TO KEEP YOUR URINE LIGHT YELLOW.  FOLLOW A HIGH FIBER/LOW FAT DIET. MEATS SHOULD BE BAKED, BROILED, OR BOILED.  AVOID FRIED FOODS. SEE INFO BELOW.  CONTINUE OMEPRAZOLE.  TAKE 30 MINUTES PRIOR TO YOUR FIRST MEAL.  YOUR BIOPSY RESULTS WILL BE AVAILABLE IN MY CHART FEB 16 AND MY OFFICE WILL CONTACT YOU IN 10-14 DAYS WITH YOUR RESULTS.   FOLLOW UP IN MAY 2017.  UPPER ENDOSCOPY AFTER CARE Read the instructions outlined below and refer to this sheet in the next week. These discharge instructions provide you with general information on caring for yourself after you leave the hospital. While your treatment has been planned according to the most current medical practices available, unavoidable complications occasionally occur. If you have any problems or questions after discharge, call DR. Kiffany Schelling, [phone].  ACTIVITY  You may resume your regular activity, but move at a slower pace for the next 24 hours.   Take frequent rest periods for the next 24 hours.   Walking will help get rid of the air and reduce the bloated feeling in your belly (abdomen).   No driving for 24 hours (because of the medicine (anesthesia) used during the test).   You may shower.   Do not sign any important legal documents or operate any machinery for 24 hours (because of the anesthesia used during the test).    NUTRITION  Drink plenty of fluids.   You may resume your normal diet as instructed by your doctor.   Begin with a light meal and progress to your normal diet. Heavy or fried foods are harder to digest and may make you feel sick to your stomach (nauseated).   Avoid alcoholic beverages for 24 hours or as instructed.    MEDICATIONS  You may resume your normal medications.   WHAT YOU CAN EXPECT TODAY  Some feelings of bloating in the abdomen.   Passage of  more gas than usual.    IF YOU HAD A BIOPSY TAKEN DURING THE UPPER ENDOSCOPY:  Eat a soft diet IF YOU HAVE NAUSEA, BLOATING, ABDOMINAL PAIN, OR VOMITING.    FINDING OUT THE RESULTS OF YOUR TEST Not all test results are available during your visit. DR. Oneida Alar WILL CALL YOU WITHIN 14 DAYS OF YOUR PROCEDUE WITH YOUR RESULTS. Do not assume everything is normal if you have not heard from DR. Shiori Adcox, CALL HER OFFICE AT [phone].  SEEK IMMEDIATE MEDICAL ATTENTION AND CALL THE OFFICE: [phone] IF:  You have more than a spotting of blood in your stool.   Your belly is swollen (abdominal distention).   You are nauseated or vomiting.   You have a temperature over 101F.   You have abdominal pain or discomfort that is severe or gets worse throughout the day.  Gastritis  Gastritis is an inflammation (the body's way of reacting to injury and/or infection) of the stomach. It is often caused by viral or bacterial (germ) infections. It can also be caused BY ASPIRIN, BC/GOODY POWDER'S, (IBUPROFEN) MOTRIN, OR ALEVE (NAPROXEN), chemicals (including alcohol), SPICY FOODS, and medications. This illness may be associated with generalized malaise (feeling tired, not well), UPPER ABDOMINAL STOMACH cramps, and fever. One common bacterial cause of gastritis is an organism known as H. Pylori. This can be treated with antibiotics.   ESOPHAGEAL STRICTURE  Esophageal strictures can be caused by stomach acid backing  up into the tube that carries food from the mouth down to the stomach (lower esophagus).  TREATMENT There are a number of medicines used to treat reflux/stricture, including: Antacids.  Proton-pump inhibitors: OMEPRAZOLE.  HOME CARE INSTRUCTIONS Eat 2-3 hours before going to bed.  Try to reach and maintain a healthy weight.  Do not eat just a few very large meals. Instead, eat 4 TO 6 smaller meals throughout the day.  Try to identify foods and beverages that make your symptoms worse, and  avoid these.  Avoid tight clothing.  Do not exercise right after eating.

## 2015-10-26 ENCOUNTER — Encounter (HOSPITAL_COMMUNITY): Payer: Self-pay | Admitting: Gastroenterology

## 2015-10-28 ENCOUNTER — Ambulatory Visit (HOSPITAL_COMMUNITY)
Admission: RE | Admit: 2015-10-28 | Discharge: 2015-10-28 | Disposition: A | Discharge status: Home / Self Care | Payer: Medicare Other | Source: Ambulatory Visit | Attending: Family Medicine | Admitting: Family Medicine

## 2015-10-28 DIAGNOSIS — Z780 Asymptomatic menopausal state: Secondary | ICD-10-CM | POA: Diagnosis not present

## 2015-10-28 DIAGNOSIS — Z13820 Encounter for screening for osteoporosis: Secondary | ICD-10-CM | POA: Diagnosis not present

## 2015-10-29 ENCOUNTER — Other Ambulatory Visit: Payer: Self-pay | Admitting: Family Medicine

## 2015-10-29 DIAGNOSIS — R1013 Epigastric pain: Secondary | ICD-10-CM | POA: Insufficient documentation

## 2015-10-31 ENCOUNTER — Encounter: Payer: Self-pay | Admitting: Family Medicine

## 2015-11-03 ENCOUNTER — Encounter (HOSPITAL_COMMUNITY): Payer: Medicare Other

## 2015-11-04 ENCOUNTER — Telehealth: Payer: Self-pay | Admitting: Gastroenterology

## 2015-11-04 NOTE — Telephone Encounter (Signed)
Please call pt. HER stomach Bx shows mild gastritis.    DRINK WATER TO KEEP YOUR URINE LIGHT YELLOW.  FOLLOW A HIGH FIBER/LOW FAT DIET. MEATS SHOULD BE BAKED, BROILED, OR BOILED.  AVOID FRIED FOODS.   CONTINUE OMEPRAZOLE.  TAKE 30 MINUTES PRIOR TO YOUR FIRST MEAL.  FOLLOW UP IN MAY 2017.

## 2015-11-04 NOTE — Telephone Encounter (Signed)
PT is aware of results.  

## 2015-11-04 NOTE — Telephone Encounter (Signed)
Reminder in epic °

## 2015-11-05 ENCOUNTER — Telehealth: Payer: Self-pay | Admitting: Family Medicine

## 2015-11-05 NOTE — Telephone Encounter (Signed)
Record is at scan center - need to contact Dr Jerilynn Birkenhead office Monday to refax visit notes

## 2015-11-05 NOTE — Telephone Encounter (Signed)
#  1 please give me eye doctor notes again-I do not recall anything back when I reviewed these on the seventh that was outstanding. Let the patient know we are getting the results to review over hopefully within a week we are let her know nurse's-please take personal responsibility to make certain that a copy of Dr. Vella Redhead note is received and delivered to me thank you

## 2015-11-05 NOTE — Telephone Encounter (Signed)
The last time patient was seen, she says Dr. Nicki Reaper had her sign a release to get her notes from Dr. Jorja Loa.  She says we were going to refer her to a special eye doctor, but she hasn't heard anything yet.  Please advise.

## 2015-11-08 NOTE — Telephone Encounter (Signed)
Talked with receptonist at Dr. Vella Redhead office and she will be sending over office notes via fax today. Please give to Dr. Nicki Reaper.

## 2015-11-11 NOTE — Telephone Encounter (Signed)
Notes were given to Autumn to give to Dr. Nicki Reaper.

## 2015-11-12 ENCOUNTER — Other Ambulatory Visit: Payer: Self-pay

## 2015-11-12 ENCOUNTER — Telehealth: Payer: Self-pay | Admitting: Family Medicine

## 2015-11-12 ENCOUNTER — Ambulatory Visit (HOSPITAL_COMMUNITY)
Admission: RE | Admit: 2015-11-12 | Discharge: 2015-11-12 | Disposition: A | Discharge status: Home / Self Care | Payer: Medicare Other | Source: Ambulatory Visit | Attending: Family Medicine | Admitting: Family Medicine

## 2015-11-12 DIAGNOSIS — H547 Unspecified visual loss: Secondary | ICD-10-CM

## 2015-11-12 DIAGNOSIS — R0602 Shortness of breath: Secondary | ICD-10-CM | POA: Insufficient documentation

## 2015-11-12 LAB — PULMONARY FUNCTION TEST
DL/VA % pred: 84 %
DL/VA: 4.27 ml/min/mmHg/L
DLCO UNC % PRED: 53 %
DLCO unc: 14.46 ml/min/mmHg
FEF 25-75 PRE: 1.47 L/s
FEF 25-75 Post: 1.79 L/sec
FEF2575-%Change-Post: 21 %
FEF2575-%PRED-PRE: 84 %
FEF2575-%Pred-Post: 102 %
FEV1-%Change-Post: 3 %
FEV1-%PRED-POST: 85 %
FEV1-%Pred-Pre: 82 %
FEV1-POST: 1.67 L
FEV1-PRE: 1.61 L
FEV1FVC-%CHANGE-POST: 7 %
FEV1FVC-%Pred-Pre: 102 %
FEV6-%Change-Post: -3 %
FEV6-%PRED-PRE: 84 %
FEV6-%Pred-Post: 81 %
FEV6-POST: 1.96 L
FEV6-PRE: 2.04 L
FEV6FVC-%PRED-PRE: 104 %
FEV6FVC-%Pred-Post: 104 %
FVC-%Change-Post: -3 %
FVC-%PRED-PRE: 81 %
FVC-%Pred-Post: 78 %
FVC-PRE: 2.04 L
FVC-Post: 1.96 L
POST FEV6/FVC RATIO: 100 %
PRE FEV1/FVC RATIO: 79 %
PRE FEV6/FVC RATIO: 100 %
Post FEV1/FVC ratio: 85 %
RV % PRED: 93 %
RV: 2.2 L
TLC % PRED: 77 %
TLC: 4.16 L

## 2015-11-12 MED ORDER — ALBUTEROL SULFATE (2.5 MG/3ML) 0.083% IN NEBU
2.5000 mg | INHALATION_SOLUTION | Freq: Once | RESPIRATORY_TRACT | Status: AC
  Administered 2015-11-12: 2.5 mg via RESPIRATORY_TRACT

## 2015-11-12 NOTE — Telephone Encounter (Signed)
LMRC

## 2015-11-12 NOTE — Telephone Encounter (Signed)
Notified patient that Dr. Nicki Reaper did receive the copy of Dr. Vella Redhead evaluation. Dr. Nicki Reaper reviewed over this. Dr. Nicki Reaper believes the patient would benefit from a second opinion with the ophthalmologist. Dr. Nicki Reaper believes that she will end up having to go to [facility] to get this opinion. Dr. Nicki Reaper would recommend [facility]. They can help evaluate the visual difficulties that she is having an advise her what would be the best treatment of this. Patient verbalized understanding and agreed. Referral in the system.

## 2015-11-12 NOTE — Telephone Encounter (Signed)
On a previous visit the patient had stated how she was having vision issues. She saw Dr. Jorja Loa. Please let the patient know that I did receive the copy of Dr. Vella Redhead evaluation. I reviewed over this. I believe the patient would benefit from a second opinion with the ophthalmologist. I believe that she will end up having to go to [facility] to get this opinion. I would recommend [facility]. They can help evaluate the visual difficulties that she is having an advise her what would be the best treatment of this. If this is fine with her please proceed forward with referral. Obviously-[place] should try to be certain that ophthalmology doctors in [place] are on her insurance.

## 2015-11-17 ENCOUNTER — Encounter: Payer: Self-pay | Admitting: Family Medicine

## 2015-11-22 ENCOUNTER — Encounter: Payer: Self-pay | Admitting: *Deleted

## 2015-11-25 ENCOUNTER — Other Ambulatory Visit: Payer: Self-pay | Admitting: Family Medicine

## 2015-11-29 DIAGNOSIS — Z1231 Encounter for screening mammogram for malignant neoplasm of breast: Secondary | ICD-10-CM | POA: Diagnosis not present

## 2015-12-16 DIAGNOSIS — Z961 Presence of intraocular lens: Secondary | ICD-10-CM | POA: Diagnosis not present

## 2015-12-16 DIAGNOSIS — Z0100 Encounter for examination of eyes and vision without abnormal findings: Secondary | ICD-10-CM | POA: Diagnosis not present

## 2015-12-17 ENCOUNTER — Encounter (HOSPITAL_COMMUNITY): Payer: Self-pay | Admitting: *Deleted

## 2015-12-17 ENCOUNTER — Emergency Department (HOSPITAL_COMMUNITY): Payer: Medicare Other

## 2015-12-17 ENCOUNTER — Emergency Department (HOSPITAL_COMMUNITY)
Admission: EM | Admit: 2015-12-17 | Discharge: 2015-12-17 | Disposition: A | Discharge status: Home / Self Care | Payer: Medicare Other | Attending: Emergency Medicine | Admitting: Emergency Medicine

## 2015-12-17 DIAGNOSIS — R109 Unspecified abdominal pain: Secondary | ICD-10-CM

## 2015-12-17 DIAGNOSIS — K625 Hemorrhage of anus and rectum: Secondary | ICD-10-CM | POA: Insufficient documentation

## 2015-12-17 DIAGNOSIS — I10 Essential (primary) hypertension: Secondary | ICD-10-CM | POA: Diagnosis not present

## 2015-12-17 DIAGNOSIS — R1033 Periumbilical pain: Secondary | ICD-10-CM | POA: Diagnosis not present

## 2015-12-17 DIAGNOSIS — Z79899 Other long term (current) drug therapy: Secondary | ICD-10-CM | POA: Diagnosis not present

## 2015-12-17 DIAGNOSIS — I4891 Unspecified atrial fibrillation: Secondary | ICD-10-CM | POA: Insufficient documentation

## 2015-12-17 DIAGNOSIS — R197 Diarrhea, unspecified: Secondary | ICD-10-CM | POA: Diagnosis not present

## 2015-12-17 DIAGNOSIS — Z87891 Personal history of nicotine dependence: Secondary | ICD-10-CM | POA: Insufficient documentation

## 2015-12-17 LAB — COMPREHENSIVE METABOLIC PANEL
ALK PHOS: 52 U/L (ref 38–126)
ALT: 19 U/L (ref 14–54)
ANION GAP: 9 (ref 5–15)
AST: 31 U/L (ref 15–41)
Albumin: 3.9 g/dL (ref 3.5–5.0)
BUN: 10 mg/dL (ref 6–20)
CALCIUM: 9 mg/dL (ref 8.9–10.3)
CO2: 28 mmol/L (ref 22–32)
CREATININE: 0.68 mg/dL (ref 0.44–1.00)
Chloride: 97 mmol/L — ABNORMAL LOW (ref 101–111)
Glucose, Bld: 109 mg/dL — ABNORMAL HIGH (ref 65–99)
Potassium: 2.7 mmol/L — CL (ref 3.5–5.1)
SODIUM: 134 mmol/L — AB (ref 135–145)
Total Bilirubin: 0.8 mg/dL (ref 0.3–1.2)
Total Protein: 6.9 g/dL (ref 6.5–8.1)

## 2015-12-17 LAB — LIPASE, BLOOD: LIPASE: 38 U/L (ref 11–51)

## 2015-12-17 LAB — URINALYSIS, ROUTINE W REFLEX MICROSCOPIC
Bilirubin Urine: NEGATIVE
Glucose, UA: NEGATIVE mg/dL
Hgb urine dipstick: NEGATIVE
Ketones, ur: NEGATIVE mg/dL
Leukocytes, UA: NEGATIVE
NITRITE: NEGATIVE
Protein, ur: NEGATIVE mg/dL
SPECIFIC GRAVITY, URINE: 1.01 (ref 1.005–1.030)
pH: 5 (ref 5.0–8.0)

## 2015-12-17 LAB — CBC WITH DIFFERENTIAL/PLATELET
Basophils Absolute: 0 10*3/uL (ref 0.0–0.1)
Basophils Relative: 0 %
Eosinophils Absolute: 0.1 10*3/uL (ref 0.0–0.7)
Eosinophils Relative: 1 %
HCT: 38 % (ref 36.0–46.0)
HEMOGLOBIN: 13.1 g/dL (ref 12.0–15.0)
Lymphocytes Relative: 18 %
Lymphs Abs: 1.9 10*3/uL (ref 0.7–4.0)
MCH: 32.2 pg (ref 26.0–34.0)
MCHC: 34.5 g/dL (ref 30.0–36.0)
MCV: 93.4 fL (ref 78.0–100.0)
MONO ABS: 1 10*3/uL (ref 0.1–1.0)
Monocytes Relative: 9 %
NEUTROS ABS: 7.7 10*3/uL (ref 1.7–7.7)
NEUTROS PCT: 72 %
PLATELETS: 242 10*3/uL (ref 150–400)
RBC: 4.07 MIL/uL (ref 3.87–5.11)
RDW: 14.8 % (ref 11.5–15.5)
WBC: 10.6 10*3/uL — AB (ref 4.0–10.5)

## 2015-12-17 MED ORDER — ONDANSETRON 4 MG PO TBDP: ORAL_TABLET | ORAL | Status: DC

## 2015-12-17 MED ORDER — IOPAMIDOL (ISOVUE-300) INJECTION 61%
100.0000 mL | Freq: Once | INTRAVENOUS | Status: AC | PRN
  Administered 2015-12-17: 100 mL via INTRAVENOUS

## 2015-12-17 MED ORDER — CIPROFLOXACIN HCL 500 MG PO TABS: 500.0000 mg | ORAL_TABLET | Freq: Two times a day (BID) | ORAL | Status: DC

## 2015-12-17 MED ORDER — ONDANSETRON HCL 4 MG/2ML IJ SOLN
4.0000 mg | Freq: Once | INTRAMUSCULAR | Status: AC
  Administered 2015-12-17: 4 mg via INTRAVENOUS
  Filled 2015-12-17: qty 2

## 2015-12-17 MED ORDER — DIATRIZOATE MEGLUMINE & SODIUM 66-10 % PO SOLN
ORAL | Status: AC
  Filled 2015-12-17: qty 30

## 2015-12-17 MED ORDER — SODIUM CHLORIDE 0.9 % IV BOLUS (SEPSIS)
1000.0000 mL | Freq: Once | INTRAVENOUS | Status: AC
  Administered 2015-12-17: 1000 mL via INTRAVENOUS
  Filled 2015-12-17: qty 1000

## 2015-12-17 MED ORDER — POTASSIUM CHLORIDE CRYS ER 20 MEQ PO TBCR
40.0000 meq | EXTENDED_RELEASE_TABLET | Freq: Once | ORAL | Status: AC
  Administered 2015-12-17: 40 meq via ORAL
  Filled 2015-12-17: qty 2

## 2015-12-17 MED ORDER — MORPHINE SULFATE (PF) 4 MG/ML IV SOLN
4.0000 mg | Freq: Once | INTRAVENOUS | Status: AC
  Administered 2015-12-17: 4 mg via INTRAVENOUS
  Filled 2015-12-17: qty 1

## 2015-12-17 MED ORDER — DICYCLOMINE HCL 20 MG PO TABS: ORAL_TABLET | ORAL | Status: DC

## 2015-12-17 NOTE — ED Notes (Signed)
Pt hadn't had a BM in 1 day so last night she took 3 Dulcolax tablets. Pt states she woke up at 4am with abdominal cramping. Pt c/o n/v/d.

## 2015-12-17 NOTE — ED Notes (Signed)
Patient given discharge instruction, verbalized understand. IV removed, band aid applied. Patient ambulatory out of the department.  

## 2015-12-17 NOTE — Discharge Instructions (Signed)
Follow-up with your doctor next week increase her potassium to 2 pills twice a day until you see your family doctor next week

## 2015-12-17 NOTE — ED Notes (Addendum)
CRITICAL VALUE ALERT  Critical value received:  KT 2.7  Date of notification:  12/17/15  Time of notification:  2118  Critical value read back:Yes.    Nurse who received alert:  RCockram  Advised: Dr Lacinda Axon

## 2015-12-17 NOTE — ED Provider Notes (Signed)
CSN: [id number]     Arrival date & time 12/17/15  1922 History   First MD Initiated Contact with Patient 12/17/15 1945     Chief Complaint  Patient presents with  . Abdominal Pain     (Consider location/radiation/quality/duration/timing/severity/associated sxs/prior Treatment) HPI.... Periumbilical abdominal pain since 4:30 AM today with associated nausea, vomiting, diarrhea. No dysuria, fever, sweats, chills, chest pain, dyspnea. She took 3 Dulcolax and had a large bowel movement. Surgical history includes abdominal hysterectomy and BTL. Severity of pain is moderate. Nothing makes symptoms better or worse.  Past Medical History  Diagnosis Date  . Hypertension   . GERD (gastroesophageal reflux disease)   . Hypercholesteremia   . Atrial fibrillation ([place])   . Arthritis   . Lipoma of arm     Left  . DIVERTICULOSIS OF COLON 08/25/2010    Qualifier: Diagnosis of  By: Hoy Morn    . HEMORRHOIDS, INTERNAL 08/25/2010    Qualifier: Diagnosis of  By: Hoy Morn    . Collagen vascular disease [facility])    Past Surgical History  Procedure Laterality Date  . 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 Thyroidectomy    . Cystectomy      Left breast  . Tumor   05/2010    Left breast under nipple  . Esophagogastroduodenoscopy      [id number] appearing Schatzki's ring, possible cervical esophageal web, both dilated with passage of a Maloney dilator/small hiatal hernia  . Abdominal hysterectomy      Partial  . Colonoscopy  2009    SLF: 1. screening colonoscopy in 10 years 2. She should follow a high fiber diet She has been given a hand out on high fiber diverticulosois and hemorrhoids  . Esophagogastroduodenoscopy N/A 10/25/2015    Procedure: ESOPHAGOGASTRODUODENOSCOPY (EGD);  Surgeon: Danie Binder, MD;  [facility];  Service: Endoscopy;  Laterality: N/A;  1215pm  . Savory dilation N/A 10/25/2015    Procedure: SAVORY DILATION;  Surgeon: Danie Binder, MD;  Location: [facility];  Service: Endoscopy;  Laterality: N/A;   Family History  Problem Relation Age of Onset  . Cancer Brother     Prostate   Social History  Substance Use Topics  . Smoking status: Former Smoker    Start date: 09/12/1983    Quit date: 09/11/1993  . Smokeless tobacco: None  . Alcohol Use: No   OB History    No data available     Review of Systems  All other systems reviewed and are negative.     Allergies  Hydrocodone; Lisinopril; and Vioxx  Home Medications   Prior to Admission medications   Medication Sig Start Date End Date Taking? Authorizing Provider  Biotin (BIOTIN MAXIMUM STRENGTH) 10 MG TABS Take 1 tablet by mouth daily.    Yes Historical Provider, MD  bisacodyl (DULCOLAX) 5 MG EC tablet Take 5-15 mg by mouth once as needed for mild constipation or moderate constipation.   Yes Historical Provider, MD  Calcium Carbonate-Vitamin D (CALTRATE 600+D PO) Take 1 tablet by mouth daily.    Yes Historical Provider, MD  CARTIA XT 300 MG 24 hr capsule TAKE 1 CAPSULE BY MOUTH EVERY DAY 09/20/15  Yes Kathyrn Drown, MD  indapamide (LOZOL) 2.5 MG tablet TAKE 1 TABLET(2.5 MG) BY MOUTH DAILY 09/21/15  Yes Kathyrn Drown, MD  omeprazole (PRILOSEC OTC) 20 MG tablet Take 20 mg by mouth daily.   Yes Historical Provider,  MD  OVER THE COUNTER MEDICATION Take 1 tablet by mouth daily. Focus factor   Yes Historical Provider, MD  OVER THE COUNTER MEDICATION Take 1 tablet by mouth daily. Strong Heart   Yes Historical Provider, MD  potassium chloride (K-DUR) 10 MEQ tablet TAKE 1 TABLET(10 MEQ) BY MOUTH TWICE DAILY 11/25/15  Yes Kathyrn Drown, MD  pravastatin (PRAVACHOL) 80 MG tablet TAKE 1 TABLET BY MOUTH EVERY DAY 10/29/15  Yes Kathyrn Drown, MD  Probiotic Product (PROBIOTIC DAILY PO) Take 1 tablet by mouth daily.    Yes Historical Provider, MD  temazepam (RESTORIL) 30 MG capsule TAKE 1 CAPSULE BY MOUTH EVERY DAY AT BEDTIME Patient taking differently: TAKE 1  CAPSULE BY MOUTH EVERY DAY AT BEDTIME AS NEEDED FOR SLEEP 07/06/15  Yes Kathyrn Drown, MD  naproxen (NAPROSYN) 375 MG tablet TAKE 1 TABLET BY MOUTH TWICE DAILY. STOP DICLOFENAC Patient not taking: Reported on 12/17/2015 09/03/15   [name], MD   BP 178/70 mmHg  Pulse 74  Temp(Src) 98.8 F (37.1 C) (Oral)  Resp 16  Ht 5\' 6"  (1.676 m)  Wt 180 lb (81.647 kg)  BMI 29.07 kg/m2  SpO2 98% Physical Exam  Constitutional: She is oriented to person, place, and time. She appears well-developed and well-nourished.  HENT:  Head: Normocephalic and atraumatic.  Eyes: Conjunctivae and EOM are normal. Pupils are equal, round, and reactive to light.  Neck: Normal range of motion. Neck supple.  Cardiovascular: Normal rate and regular rhythm.   Pulmonary/Chest: Effort normal and breath sounds normal.  Abdominal: Soft. Bowel sounds are normal.  Minimal perimbilical tenderness  Genitourinary:  Rectal exam: Red blood. Heme positive. no masses  Musculoskeletal: Normal range of motion.  Neurological: She is alert and oriented to person, place, and time.  Skin: Skin is warm and dry.  Psychiatric: She has a normal mood and affect. Her behavior is normal.  Nursing note and vitals reviewed.   ED Course  Procedures (including critical care time) Labs Review Labs Reviewed  CBC WITH DIFFERENTIAL/PLATELET - Abnormal; Notable for the following:    WBC 10.6 (*)    All other components within normal limits  COMPREHENSIVE METABOLIC PANEL - Abnormal; Notable for the following:    Sodium 134 (*)    Potassium 2.7 (*)    Chloride 97 (*)    Glucose, Bld 109 (*)    All other components within normal limits  LIPASE, BLOOD  URINALYSIS, ROUTINE W REFLEX MICROSCOPIC (NOT AT [facility])  POC OCCULT BLOOD, ED    Imaging Review No results found. I have personally reviewed and evaluated these images and lab results as part of my medical decision-making.   EKG Interpretation None      MDM   Final diagnoses:   Abdominal pain, unspecified abdominal location  Rectal bleeding    No acute abdomen. Rectal positive for heme. CT scan shows no acute findings. Discharge medication Zofran 4 mg, Cipro 500 mg, Bentyl 20 mg.  Patient has primary care follow-up.    Nat Christen, MD 12/19/15 1807

## 2015-12-18 LAB — POC OCCULT BLOOD, ED: Fecal Occult Bld: POSITIVE — AB

## 2015-12-20 ENCOUNTER — Other Ambulatory Visit: Payer: Self-pay | Admitting: Family Medicine

## 2015-12-22 ENCOUNTER — Encounter: Payer: Self-pay | Admitting: Gastroenterology

## 2015-12-24 ENCOUNTER — Other Ambulatory Visit: Payer: Self-pay | Admitting: Family Medicine

## 2016-01-21 ENCOUNTER — Other Ambulatory Visit: Payer: Self-pay | Admitting: Family Medicine

## 2016-01-21 NOTE — Telephone Encounter (Signed)
Ok six mo worth 

## 2016-01-27 ENCOUNTER — Encounter: Payer: Self-pay | Admitting: Family Medicine

## 2016-01-27 ENCOUNTER — Ambulatory Visit (INDEPENDENT_AMBULATORY_CARE_PROVIDER_SITE_OTHER): Payer: Medicare Other | Admitting: Family Medicine

## 2016-01-27 VITALS — BP 154/80 | Temp 98.6°F | Ht 66.0 in | Wt 174.0 lb

## 2016-01-27 DIAGNOSIS — J019 Acute sinusitis, unspecified: Secondary | ICD-10-CM

## 2016-01-27 DIAGNOSIS — B9689 Other specified bacterial agents as the cause of diseases classified elsewhere: Secondary | ICD-10-CM

## 2016-01-27 MED ORDER — PRAVASTATIN SODIUM 80 MG PO TABS: 80.0000 mg | ORAL_TABLET | Freq: Every day | ORAL | Status: DC

## 2016-01-27 MED ORDER — INDAPAMIDE 2.5 MG PO TABS: ORAL_TABLET | ORAL | Status: DC

## 2016-01-27 MED ORDER — TEMAZEPAM 30 MG PO CAPS: 30.0000 mg | ORAL_CAPSULE | Freq: Every day | ORAL | Status: DC

## 2016-01-27 MED ORDER — CEFPROZIL 500 MG PO TABS: 500.0000 mg | ORAL_TABLET | Freq: Two times a day (BID) | ORAL | Status: DC

## 2016-01-27 MED ORDER — POTASSIUM CHLORIDE ER 10 MEQ PO TBCR: EXTENDED_RELEASE_TABLET | ORAL | Status: DC

## 2016-01-27 MED ORDER — DILTIAZEM HCL ER COATED BEADS 300 MG PO CP24: ORAL_CAPSULE | ORAL | Status: DC

## 2016-01-27 NOTE — Progress Notes (Signed)
   Subjective:    Patient ID: Melissa Barajas, female    DOB: 1941-08-05, 75 y.o.   MRN: [id number]  Sinusitis This is a new problem. Episode onset: 3 days. Associated symptoms include chills, congestion, coughing, headaches and a sore throat. Pertinent negatives include no ear pain or shortness of breath. Treatments tried: nyquil, mucinex.   Over the past 5-6 days some sore throat head congestion drainage over the past 3 days some chills not feeling good denies wheezing or difficulty breathing   Review of Systems  Constitutional: Positive for chills. Negative for fever and activity change.  HENT: Positive for congestion, rhinorrhea and sore throat. Negative for ear pain.   Eyes: Negative for discharge.  Respiratory: Positive for cough. Negative for shortness of breath and wheezing.   Cardiovascular: Negative for chest pain.  Neurological: Positive for headaches.       Objective:   Physical Exam  Constitutional: She appears well-developed.  HENT:  Head: Normocephalic.  Nose: Nose normal.  Mouth/Throat: Oropharynx is clear and moist. No oropharyngeal exudate.  Neck: Neck supple.  Cardiovascular: Normal rate and normal heart sounds.   No murmur heard. Pulmonary/Chest: Effort normal and breath sounds normal. She has no wheezes.  Lymphadenopathy:    She has no cervical adenopathy.  Skin: Skin is warm and dry.  Nursing note and vitals reviewed.         Assessment & Plan:  Patient was seen today for upper respiratory illness. It is felt that the patient is dealing with sinusitis. Antibiotics were prescribed today. Importance of compliance with medication was discussed. Symptoms should gradually resolve over the course of the next several days. If high fevers, progressive illness, difficulty breathing, worsening condition or failure for symptoms to improve over the next several days then the patient is to follow-up. If any emergent conditions the patient is to follow-up in the  emergency department otherwise to follow-up in the office.   I do not detect any evidence of pneumonia. I told the patient if high fevers difficulty breathing or worse immediately get rechecked here or ER. Follow-up later this year for regular check up.

## 2016-02-02 ENCOUNTER — Other Ambulatory Visit: Payer: Self-pay | Admitting: Family Medicine

## 2016-02-03 ENCOUNTER — Other Ambulatory Visit: Payer: Self-pay | Admitting: Family Medicine

## 2016-03-16 ENCOUNTER — Other Ambulatory Visit: Payer: Self-pay | Admitting: Family Medicine

## 2016-03-16 ENCOUNTER — Encounter: Payer: Self-pay | Admitting: Family Medicine

## 2016-03-16 ENCOUNTER — Ambulatory Visit (INDEPENDENT_AMBULATORY_CARE_PROVIDER_SITE_OTHER): Payer: Medicare Other | Admitting: Family Medicine

## 2016-03-16 VITALS — BP 128/68 | Temp 98.3°F | Ht 66.0 in | Wt 174.2 lb

## 2016-03-16 DIAGNOSIS — M7662 Achilles tendinitis, left leg: Secondary | ICD-10-CM

## 2016-03-16 MED ORDER — TRAMADOL HCL 50 MG PO TABS: 50.0000 mg | ORAL_TABLET | Freq: Three times a day (TID) | ORAL | Status: DC | PRN

## 2016-03-16 MED ORDER — ETODOLAC 400 MG PO TABS: 400.0000 mg | ORAL_TABLET | Freq: Two times a day (BID) | ORAL | Status: DC

## 2016-03-16 NOTE — Progress Notes (Signed)
   Subjective:    Patient ID: Melissa Barajas, female    DOB: 06-27-1941, 75 y.o.   MRN: [id number]  Foot Pain This is a new problem. The current episode started 1 to 4 weeks ago. Associated symptoms include joint swelling and myalgias. Treatments tried: Naproxen.  She is tried cold compresses warm compresses stretching and Naprosyn without having any success she is had progressive pain and discomfort  Patient in today for right foot pain, with edema. States no other concerns this visit.  Review of Systems  Musculoskeletal: Positive for myalgias and joint swelling.       Objective:   Physical Exam On physical exam there is tenderness and swelling around the Achilles tendon. Ears pain with movement. I do not find any evidence of a rupture.  This patient was under the care of Dr. Caprice Beaver a few years ago for unrelated issue and is hoping to get back in with Dr. Caprice Beaver     Assessment & Plan:  Achilles tendinitis I recommend cold compresses 15 minutes every 2 hours along with stretching exercises I also recommended anti-inflammatory we will send in Lodine We will also use tramadol for pain caution drowsiness I have spoken with Dr. Lenora Boys, she will review the case with Dr. Caprice Beaver and try to help this patient

## 2016-03-28 DIAGNOSIS — M7661 Achilles tendinitis, right leg: Secondary | ICD-10-CM | POA: Diagnosis not present

## 2016-03-28 DIAGNOSIS — S86011A Strain of right Achilles tendon, initial encounter: Secondary | ICD-10-CM | POA: Diagnosis not present

## 2016-03-31 ENCOUNTER — Other Ambulatory Visit (HOSPITAL_COMMUNITY): Payer: Self-pay | Admitting: Podiatry

## 2016-03-31 DIAGNOSIS — S86011A Strain of right Achilles tendon, initial encounter: Secondary | ICD-10-CM

## 2016-04-05 ENCOUNTER — Other Ambulatory Visit: Payer: Self-pay | Admitting: Family Medicine

## 2016-04-06 ENCOUNTER — Other Ambulatory Visit: Payer: Self-pay | Admitting: *Deleted

## 2016-04-06 NOTE — Telephone Encounter (Signed)
Nurse's-please call talk with patient. This patient is having request on Lodine , but her medication list also says Naprosyn she cannot be on 2 anti-inflammatories please get clarification before we make any decision

## 2016-04-13 ENCOUNTER — Ambulatory Visit (HOSPITAL_COMMUNITY)
Admission: RE | Admit: 2016-04-13 | Discharge: 2016-04-13 | Disposition: A | Discharge status: Home / Self Care | Payer: Medicare Other | Source: Ambulatory Visit | Attending: Podiatry | Admitting: Podiatry

## 2016-04-13 ENCOUNTER — Ambulatory Visit (INDEPENDENT_AMBULATORY_CARE_PROVIDER_SITE_OTHER): Payer: Medicare Other | Admitting: Family Medicine

## 2016-04-13 ENCOUNTER — Ambulatory Visit (HOSPITAL_COMMUNITY)
Admission: RE | Admit: 2016-04-13 | Discharge: 2016-04-13 | Disposition: A | Discharge status: Home / Self Care | Payer: Medicare Other | Source: Ambulatory Visit | Attending: Family Medicine | Admitting: Family Medicine

## 2016-04-13 ENCOUNTER — Encounter: Payer: Self-pay | Admitting: Family Medicine

## 2016-04-13 VITALS — BP 132/86 | Ht 66.0 in | Wt 174.0 lb

## 2016-04-13 DIAGNOSIS — S86011A Strain of right Achilles tendon, initial encounter: Secondary | ICD-10-CM

## 2016-04-13 DIAGNOSIS — M79602 Pain in left arm: Secondary | ICD-10-CM | POA: Diagnosis not present

## 2016-04-13 DIAGNOSIS — M25512 Pain in left shoulder: Secondary | ICD-10-CM | POA: Diagnosis not present

## 2016-04-13 DIAGNOSIS — X58XXXA Exposure to other specified factors, initial encounter: Secondary | ICD-10-CM | POA: Insufficient documentation

## 2016-04-13 DIAGNOSIS — S96821A Laceration of other specified muscles and tendons at ankle and foot level, right foot, initial encounter: Secondary | ICD-10-CM | POA: Diagnosis not present

## 2016-04-13 DIAGNOSIS — M7661 Achilles tendinitis, right leg: Secondary | ICD-10-CM | POA: Diagnosis not present

## 2016-04-13 DIAGNOSIS — S4992XA Unspecified injury of left shoulder and upper arm, initial encounter: Secondary | ICD-10-CM | POA: Diagnosis not present

## 2016-04-13 DIAGNOSIS — M79622 Pain in left upper arm: Secondary | ICD-10-CM | POA: Diagnosis not present

## 2016-04-13 DIAGNOSIS — S86021A Laceration of right Achilles tendon, initial encounter: Secondary | ICD-10-CM | POA: Diagnosis present

## 2016-04-13 DIAGNOSIS — M25571 Pain in right ankle and joints of right foot: Secondary | ICD-10-CM | POA: Diagnosis not present

## 2016-04-13 MED ORDER — OXYCODONE-ACETAMINOPHEN 5-325 MG PO TABS: 1.0000 | ORAL_TABLET | Freq: Four times a day (QID) | ORAL | 0 refills | Status: DC | PRN

## 2016-04-13 NOTE — Progress Notes (Signed)
   Subjective:    Patient ID: Melissa Barajas, female    DOB: 01/30/1941, 75 y.o.   MRN: [id number]  HPI Patient arrives with c/o left arm pain s/p fall last night.Patient claims a left upper mid arm pain. Radiating into shoulder. Got her foot tangled up with her pet. Took a tumble fell on her arm. A lot of aching from mid humerus up today. Deep ache at times. Minimally responsive to anti-inflammatories Review of Systems No headache, no major weight loss or weight gain, no chest pain no back pain abdominal pain no change in bowel habits complete ROS otherwise negative     Objective:   Physical Exam Alert vitals stable, NAD. Blood pressure good on repeat. HEENT normal. Lungs clear. Heart regular rate and rhythm. Upper mid humerus very tender to palpation difficulty with extension at shoulder level       Assessment & Plan:  Impression arm injury plan recommend x-rays to rule out acute fracture. Add pain medicine to anti-inflammatory. Local measures discussed further recommendations based on x-ray results

## 2016-04-14 ENCOUNTER — Telehealth: Payer: Self-pay | Admitting: Family Medicine

## 2016-04-14 NOTE — Telephone Encounter (Signed)
Patient requesting results to x-ray on her arm.

## 2016-04-14 NOTE — Telephone Encounter (Signed)
Results discussed with patient. 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 Patient verbalized understanding(see result note)

## 2016-04-18 DIAGNOSIS — M7661 Achilles tendinitis, right leg: Secondary | ICD-10-CM | POA: Diagnosis not present

## 2016-04-18 DIAGNOSIS — S86011A Strain of right Achilles tendon, initial encounter: Secondary | ICD-10-CM | POA: Diagnosis not present

## 2016-06-19 ENCOUNTER — Other Ambulatory Visit: Payer: Self-pay | Admitting: Family Medicine

## 2016-06-19 NOTE — Telephone Encounter (Signed)
Refill times 3 

## 2016-06-22 ENCOUNTER — Ambulatory Visit (INDEPENDENT_AMBULATORY_CARE_PROVIDER_SITE_OTHER): Payer: Medicare Other

## 2016-06-22 DIAGNOSIS — Z23 Encounter for immunization: Secondary | ICD-10-CM | POA: Diagnosis not present

## 2016-07-10 ENCOUNTER — Other Ambulatory Visit: Payer: Self-pay | Admitting: Family Medicine

## 2016-07-14 ENCOUNTER — Encounter: Payer: Self-pay | Admitting: Family Medicine

## 2016-07-14 ENCOUNTER — Ambulatory Visit (INDEPENDENT_AMBULATORY_CARE_PROVIDER_SITE_OTHER): Payer: Medicare Other | Admitting: Family Medicine

## 2016-07-14 VITALS — BP 130/84 | Temp 97.7°F | Ht 66.0 in | Wt 174.4 lb

## 2016-07-14 DIAGNOSIS — B349 Viral infection, unspecified: Secondary | ICD-10-CM | POA: Diagnosis not present

## 2016-07-14 DIAGNOSIS — B9689 Other specified bacterial agents as the cause of diseases classified elsewhere: Secondary | ICD-10-CM

## 2016-07-14 DIAGNOSIS — J019 Acute sinusitis, unspecified: Secondary | ICD-10-CM

## 2016-07-14 DIAGNOSIS — J209 Acute bronchitis, unspecified: Secondary | ICD-10-CM | POA: Diagnosis not present

## 2016-07-14 MED ORDER — AZITHROMYCIN 250 MG PO TABS: ORAL_TABLET | ORAL | 0 refills | Status: DC

## 2016-07-14 NOTE — Progress Notes (Signed)
   Subjective:    Patient ID: Melissa Barajas, female    DOB: 01/16/1941, 75 y.o.   MRN: [id number]  Cough  This is a new problem. The current episode started in the past 7 days. Associated symptoms include headaches, nasal congestion, a sore throat and wheezing. Pertinent negatives include no fever. Treatments tried: nyquil.   Viral like illness several days ago now with head congestion drainage coughing fell little short of breath or any of the week but does not feel short of breath now states energy level low denies sweats or chills states mild coughing   Review of Systems  Constitutional: Positive for fatigue. Negative for fever.  HENT: Positive for sore throat.   Respiratory: Positive for cough and wheezing.   Gastrointestinal: Negative for diarrhea and nausea.  Neurological: Positive for headaches.       Objective:   Physical Exam  Constitutional: She appears well-developed.  HENT:  Head: Normocephalic.  Nose: Nose normal.  Mouth/Throat: Oropharynx is clear and moist. No oropharyngeal exudate.  Neck: Neck supple.  Cardiovascular: Normal rate and normal heart sounds.   No murmur heard. Pulmonary/Chest: Effort normal and breath sounds normal. She has no wheezes.  Lymphadenopathy:    She has no cervical adenopathy.  Skin: Skin is warm and dry.  Nursing note and vitals reviewed.         Assessment & Plan:  Viral syndrome Secondary rhinosinusitis Secondary bronchitis Antibiotics prescribed If progressive fevers or worse to follow-up No x-rays or lab work indicated currently [place] of rest recommended. Warning signs were discussed in detail.

## 2016-07-21 DIAGNOSIS — M7661 Achilles tendinitis, right leg: Secondary | ICD-10-CM | POA: Diagnosis not present

## 2016-07-30 ENCOUNTER — Other Ambulatory Visit: Payer: Self-pay | Admitting: Family Medicine

## 2016-07-31 ENCOUNTER — Ambulatory Visit: Payer: Medicare Other | Admitting: Family Medicine

## 2016-07-31 NOTE — Telephone Encounter (Signed)
May have this plus one additional refill needs office visit

## 2016-08-11 DIAGNOSIS — M7661 Achilles tendinitis, right leg: Secondary | ICD-10-CM | POA: Diagnosis not present

## 2016-08-11 DIAGNOSIS — M79671 Pain in right foot: Secondary | ICD-10-CM | POA: Diagnosis not present

## 2016-08-17 ENCOUNTER — Other Ambulatory Visit: Payer: Self-pay

## 2016-09-01 DIAGNOSIS — M7661 Achilles tendinitis, right leg: Secondary | ICD-10-CM | POA: Diagnosis not present

## 2016-09-01 DIAGNOSIS — M79671 Pain in right foot: Secondary | ICD-10-CM | POA: Diagnosis not present

## 2016-09-15 ENCOUNTER — Other Ambulatory Visit: Payer: Self-pay | Admitting: Family Medicine

## 2016-09-15 NOTE — Telephone Encounter (Signed)
1 month refill,needs ov

## 2016-10-05 ENCOUNTER — Telehealth: Payer: Self-pay | Admitting: Family Medicine

## 2016-10-05 DIAGNOSIS — R739 Hyperglycemia, unspecified: Secondary | ICD-10-CM

## 2016-10-05 DIAGNOSIS — E785 Hyperlipidemia, unspecified: Secondary | ICD-10-CM

## 2016-10-05 DIAGNOSIS — Z79899 Other long term (current) drug therapy: Secondary | ICD-10-CM

## 2016-10-05 DIAGNOSIS — I10 Essential (primary) hypertension: Secondary | ICD-10-CM

## 2016-10-05 NOTE — Telephone Encounter (Signed)
Cbc,lipid,liver,met7,A1C

## 2016-10-05 NOTE — Telephone Encounter (Signed)
Patient requesting lab work has physical appointment on 2/23.

## 2016-10-06 NOTE — Telephone Encounter (Signed)
Orders ready. Pt notified.  

## 2016-10-16 ENCOUNTER — Other Ambulatory Visit: Payer: Self-pay | Admitting: Family Medicine

## 2016-10-20 DIAGNOSIS — Z79899 Other long term (current) drug therapy: Secondary | ICD-10-CM | POA: Diagnosis not present

## 2016-10-20 DIAGNOSIS — I10 Essential (primary) hypertension: Secondary | ICD-10-CM | POA: Diagnosis not present

## 2016-10-20 DIAGNOSIS — E785 Hyperlipidemia, unspecified: Secondary | ICD-10-CM | POA: Diagnosis not present

## 2016-10-20 DIAGNOSIS — R739 Hyperglycemia, unspecified: Secondary | ICD-10-CM | POA: Diagnosis not present

## 2016-10-21 LAB — HEMOGLOBIN A1C
ESTIMATED AVERAGE GLUCOSE: 117 mg/dL
Hgb A1c MFr Bld: 5.7 % — ABNORMAL HIGH (ref 4.8–5.6)

## 2016-10-21 LAB — HEPATIC FUNCTION PANEL
ALBUMIN: 4.2 g/dL (ref 3.5–4.8)
ALT: 12 IU/L (ref 0–32)
AST: 22 IU/L (ref 0–40)
Alkaline Phosphatase: 67 IU/L (ref 39–117)
BILIRUBIN TOTAL: 0.6 mg/dL (ref 0.0–1.2)
Bilirubin, Direct: 0.19 mg/dL (ref 0.00–0.40)
TOTAL PROTEIN: 7.3 g/dL (ref 6.0–8.5)

## 2016-10-21 LAB — CBC WITH DIFFERENTIAL/PLATELET
BASOS ABS: 0.1 10*3/uL (ref 0.0–0.2)
BASOS: 1 %
EOS (ABSOLUTE): 0.4 10*3/uL (ref 0.0–0.4)
Eos: 6 %
HEMOGLOBIN: 12.8 g/dL (ref 11.1–15.9)
Hematocrit: 37.9 % (ref 34.0–46.6)
Immature Grans (Abs): 0 10*3/uL (ref 0.0–0.1)
Immature Granulocytes: 0 %
LYMPHS ABS: 2.4 10*3/uL (ref 0.7–3.1)
Lymphs: 38 %
MCH: 32.1 pg (ref 26.6–33.0)
MCHC: 33.8 g/dL (ref 31.5–35.7)
MCV: 95 fL (ref 79–97)
MONOCYTES: 11 %
Monocytes Absolute: 0.7 10*3/uL (ref 0.1–0.9)
NEUTROS ABS: 2.8 10*3/uL (ref 1.4–7.0)
Neutrophils: 44 %
Platelets: 237 10*3/uL (ref 150–379)
RBC: 3.99 x10E6/uL (ref 3.77–5.28)
RDW: 14.9 % (ref 12.3–15.4)
WBC: 6.3 10*3/uL (ref 3.4–10.8)

## 2016-10-21 LAB — BASIC METABOLIC PANEL
BUN/Creatinine Ratio: 19 (ref 12–28)
BUN: 16 mg/dL (ref 8–27)
CALCIUM: 10.1 mg/dL (ref 8.7–10.3)
CO2: 31 mmol/L — AB (ref 18–29)
Chloride: 96 mmol/L (ref 96–106)
Creatinine, Ser: 0.84 mg/dL (ref 0.57–1.00)
GFR, EST AFRICAN AMERICAN: 80 mL/min/{1.73_m2} (ref >59)
GFR, EST NON AFRICAN AMERICAN: 69 mL/min/{1.73_m2} (ref >59)
Glucose: 106 mg/dL — ABNORMAL HIGH (ref 65–99)
Potassium: 3.2 mmol/L — ABNORMAL LOW (ref 3.5–5.2)
Sodium: 142 mmol/L (ref 134–144)

## 2016-10-21 LAB — LIPID PANEL
CHOLESTEROL TOTAL: 173 mg/dL (ref 100–199)
Chol/HDL Ratio: 4.3 ratio units (ref 0.0–4.4)
HDL: 40 mg/dL (ref >39)
LDL Calculated: 115 mg/dL — ABNORMAL HIGH (ref 0–99)
Triglycerides: 89 mg/dL (ref 0–149)
VLDL CHOLESTEROL CAL: 18 mg/dL (ref 5–40)

## 2016-10-23 ENCOUNTER — Other Ambulatory Visit: Payer: Self-pay | Admitting: Family Medicine

## 2016-10-23 NOTE — Telephone Encounter (Signed)
Last seen 07/14/16 (sick)

## 2016-10-23 NOTE — Telephone Encounter (Signed)
Patient may have this +3 refills needs office visit in the spring

## 2016-10-26 ENCOUNTER — Telehealth: Payer: Self-pay | Admitting: Family Medicine

## 2016-10-26 MED ORDER — TEMAZEPAM 30 MG PO CAPS: 30.0000 mg | ORAL_CAPSULE | Freq: Every day | ORAL | 0 refills | Status: DC

## 2016-10-26 NOTE — Telephone Encounter (Signed)
Prescription was faxed to pharmacy 10/23/16. Prescription was called into pharmacy today. Patient notified.

## 2016-10-26 NOTE — Telephone Encounter (Signed)
Patient said her pharmacy told her that we wouldn't refill her Temazepam and she is wanting to know the reason.  She said she can't sleep without it.

## 2016-11-02 ENCOUNTER — Encounter: Payer: Self-pay | Admitting: Gastroenterology

## 2016-11-02 ENCOUNTER — Ambulatory Visit (INDEPENDENT_AMBULATORY_CARE_PROVIDER_SITE_OTHER): Payer: Medicare Other | Admitting: Gastroenterology

## 2016-11-02 DIAGNOSIS — K30 Functional dyspepsia: Secondary | ICD-10-CM | POA: Diagnosis not present

## 2016-11-02 DIAGNOSIS — K5904 Chronic idiopathic constipation: Secondary | ICD-10-CM | POA: Diagnosis not present

## 2016-11-02 DIAGNOSIS — K219 Gastro-esophageal reflux disease without esophagitis: Secondary | ICD-10-CM | POA: Diagnosis not present

## 2016-11-02 NOTE — Assessment & Plan Note (Signed)
SYMPTOMS FAIRLY WELL CONTROLLED.  CONTINUE OMEPRAZOLE.  TAKE 30 MINUTES PRIOR TO YOUR FIRST MEAL. AVOID TRIGGERS.

## 2016-11-02 NOTE — Progress Notes (Signed)
cc'ed to pcp °

## 2016-11-02 NOTE — Assessment & Plan Note (Signed)
SYMPTOMS FAIRLY WELL CONTROLLED.  AVOID ITEMS THAT CAUSE BLOATING & GAS.  HANDOUT GIVEN. DRINK WATER TO KEEP YOUR URINE LIGHT YELLOW. CONTINUE YOUR WEIGHT LOSS EFFORTS. FOLLOW A LOW FAT DIET.  HANDOUT GIVEN. CONTINUE OMEPRAZOLE.  TAKE 30 MINUTES PRIOR TO YOUR FIST MEAL.  FOLLOW UP IN 6 MOS.

## 2016-11-02 NOTE — Assessment & Plan Note (Signed)
SYMPTOMS FAIRLY WELL CONTROLLED.  CONTINUE PROBIOTIC.

## 2016-11-02 NOTE — Progress Notes (Signed)
ON RECALL  °

## 2016-11-02 NOTE — Patient Instructions (Addendum)
DRINK WATER TO KEEP YOUR URINE LIGHT YELLOW.  CONTINUE YOUR WEIGHT LOSS EFFORTS.  FOLLOW A LOW FAT DIET. MEATS SHOULD BE BAKED, BROILED, OR BOILED. AVOID FRIED FOODS.  AVOID ITEMS THAT CAUSE BLOATING & GAS. SEE INFO BELOW.  CONTINUE OMEPRAZOLE.  TAKE 30 MINUTES PRIOR TO YOUR FIST MEAL.  FOLLOW UP IN 6 MOS.   BLOATING AND GAS PREVENTION  Although gas may be uncomfortable and embarrassing, it is not life-threatening. Understanding causes, ways to reduce symptoms, and treatment will help most people find some relief. Points to remember . Everyone has gas in the digestive tract. Marland Kitchen People often believe normal passage of gas to be excessive. . Gas comes from two main sources: swallowed air and normal breakdown of certain foods by harmless bacteria naturally present in the large intestine. . Many foods with carbohydrates can cause gas. Fats and proteins cause little gas. . Foods that may cause gas include o beans  o vegetables, such as broccoli, cabbage, brussels sprouts, onions, artichokes, and asparagus  o fruits, such as pears, apples, and peaches  o whole grains, such as whole wheat and bran  o soft drinks and fruit drinks  o milk and milk products, such as cheese and ice cream, and packaged foods prepared with lactose, such as bread, cereal, and salad dressing  o foods containing sorbitol, such as dietetic foods and sugar free candies and gums . The most common symptoms of gas are belching, flatulence, bloating, and abdominal pain. However, some of these symptoms are often caused by an intestinal disorder, such as irritable bowel syndrome, rather than too much gas. . The most common ways to reduce the discomfort of gas are changing diet, taking nonprescription medicines, and reducing the amount of air swallowed. . Digestive enzymes, such as lactase supplements, actually help digest carbohydrates and may allow people to eat foods that normally cause gas.

## 2016-11-02 NOTE — Progress Notes (Signed)
Subjective:    Patient ID: Melissa Barajas, female    DOB: [birth year], 76 y.o.   MRN: [id number]  Sallee Lange, MD  HPI Was doing good but ate tomatoes. When it starts swelling. DRINKS SOY OR ALMOND. ICE CREAM: ONCE A WEEK. CHEESE: < 1X/MO. RARE CONSTIPATION TREATED WITH DULCOLAX. PROBIOTICS KEEP HER REGULAR. BM DAILY. ABDOMINAL PAIN @ [facility](SHARP, OFF AND ON, WHEN SHE FEELS BLOATED). RARE NAUSEA WHEN CONSTIPATED. SOB/CHEST PAIN OFF AND ON.  PT DENIES FEVER, CHILLS, HEMATOCHEZIA, HEMATEMESIS, vomiting, melena, diarrhea, CHEST PAIN, SHORTNESS OF BREATH, CHANGE IN BOWEL IN HABITS, problems swallowing, OR heartburn or indigestion.  Past Medical History:  Diagnosis Date  . Arthritis   . Atrial fibrillation ([place])   . Collagen vascular disease ([place])   . DIVERTICULOSIS OF COLON 08/25/2010   Qualifier: Diagnosis of  By: Hoy Morn    . GERD (gastroesophageal reflux disease)   . HEMORRHOIDS, INTERNAL 08/25/2010   Qualifier: Diagnosis of  By: Hoy Morn    . Hypercholesteremia   . Hypertension   . Lipoma of arm    Left    Past Surgical History:  Procedure Laterality Date  . ABDOMINAL HYSTERECTOMY     Partial  . COLONOSCOPY  2009   SLF: 1. screening colonoscopy in 10 years 2. She should follow a high fiber diet She has been given a hand out on high fiber diverticulosois and hemorrhoids  . CYSTECTOMY     Left breast  . ESOPHAGOGASTRODUODENOSCOPY     [id number] appearing Schatzki's ring, possible cervical esophageal web, both dilated with passage of a Maloney dilator/small hiatal hernia  . ESOPHAGOGASTRODUODENOSCOPY N/A 10/25/2015   Procedure: ESOPHAGOGASTRODUODENOSCOPY (EGD);  Surgeon: Danie Binder, MD;  Location: [facility];  Service: Endoscopy;  Laterality: N/A;  1215pm  . SAVORY DILATION N/A 10/25/2015   Procedure: SAVORY DILATION;  Surgeon: Danie Binder, MD;  Location: [facility];  Service: Endoscopy;  Laterality: N/A;  . THYROIDECTOMY    . Tumor   05/2010   Left breast under nipple  . 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 Hydrocodone Nausea Only  . Lisinopril     Angio edema  . Naprosyn [Naproxen]     nausea  . Vioxx [Rofecoxib]     Current Outpatient Prescriptions  Medication Sig Dispense Refill  . Biotin (BIOTIN MAXIMUM STRENGTH) 10 MG TABS Take 1 tablet by mouth daily.     . bisacodyl (DULCOLAX) 5 MG EC tablet Take 5-15 mg by mouth once as needed for mild constipation or moderate constipation.     . Calcium Carbonate-Vitamin D (CALTRATE 600+D PO) Take 1 tablet by mouth daily.     Marland Kitchen CARTIA XT 300 MG 24 hr capsule TAKE 1 CAPSULE BY MOUTH EVERY DAY    . etodolac (LODINE) 400 MG tablet TAKE 1 TABLET BY MOUTH TWICE DAILY    . indapamide (LOZOL) 2.5 MG tablet TAKE 1 TABLET(2.5 MG) BY MOUTH DAILY    . omeprazole (PRILOSEC OTC) 20 MG tablet Take 20 mg by mouth daily.    Marland Kitchen OVER THE COUNTER MEDICATION Take 1 tablet by mouth daily. Focus factor    . OVER THE COUNTER MEDICATION Take 1 tablet by mouth daily. Reported on 03/16/2016    . potassium chloride (K-DUR) 10 MEQ tablet TAKE 1 TABLET(10 MEQ) BY MOUTH TWICE DAILY    . pravastatin (PRAVACHOL) 80 MG tablet Take 1 tablet (80 mg total) by  mouth daily.    . Probiotic Product (PROBIOTIC DAILY PO) Take 1 tablet by mouth daily.     . temazepam (RESTORIL) 30 MG capsule Take 1 capsule (30 mg total) by mouth at bedtime.    .      .      .      .      .      .      .      .        Review of Systems PER HPI OTHERWISE ALL SYSTEMS ARE NEGATIVE.    Objective:   Physical Exam  Constitutional: She is oriented to person, place, and time. She appears well-developed and well-nourished. No distress.  HENT:  Head: Normocephalic and atraumatic.  Mouth/Throat: Oropharynx is clear and moist. No oropharyngeal exudate.  Eyes: Pupils are equal, round, and reactive to light. No scleral icterus.  Neck: Normal range of motion. Neck supple.    Cardiovascular: Normal rate, regular rhythm and normal heart sounds.   Pulmonary/Chest: Effort normal and breath sounds normal. No respiratory distress.  Abdominal: Soft. Bowel sounds are normal. She exhibits no distension. There is no tenderness.  Musculoskeletal: She exhibits no edema.  Lymphadenopathy:    She has no cervical adenopathy.  Neurological: She is alert and oriented to person, place, and time.  Psychiatric: She has a normal mood and affect.  Vitals reviewed.      Assessment & Plan:

## 2016-11-03 ENCOUNTER — Ambulatory Visit (INDEPENDENT_AMBULATORY_CARE_PROVIDER_SITE_OTHER): Payer: Medicare Other | Admitting: Family Medicine

## 2016-11-03 ENCOUNTER — Encounter: Payer: Self-pay | Admitting: Family Medicine

## 2016-11-03 VITALS — BP 134/62 | Ht 66.0 in | Wt 181.0 lb

## 2016-11-03 DIAGNOSIS — I10 Essential (primary) hypertension: Secondary | ICD-10-CM

## 2016-11-03 DIAGNOSIS — E7849 Other hyperlipidemia: Secondary | ICD-10-CM

## 2016-11-03 DIAGNOSIS — E876 Hypokalemia: Secondary | ICD-10-CM

## 2016-11-03 DIAGNOSIS — R739 Hyperglycemia, unspecified: Secondary | ICD-10-CM | POA: Diagnosis not present

## 2016-11-03 DIAGNOSIS — E784 Other hyperlipidemia: Secondary | ICD-10-CM

## 2016-11-03 MED ORDER — INDAPAMIDE 2.5 MG PO TABS: ORAL_TABLET | ORAL | 1 refills | Status: DC

## 2016-11-03 MED ORDER — POTASSIUM CHLORIDE ER 10 MEQ PO TBCR: EXTENDED_RELEASE_TABLET | ORAL | 1 refills | Status: DC

## 2016-11-03 MED ORDER — ETODOLAC 400 MG PO TABS: 400.0000 mg | ORAL_TABLET | Freq: Two times a day (BID) | ORAL | 1 refills | Status: DC

## 2016-11-03 MED ORDER — DILTIAZEM HCL ER COATED BEADS 300 MG PO CP24: ORAL_CAPSULE | ORAL | 1 refills | Status: DC

## 2016-11-03 MED ORDER — TEMAZEPAM 30 MG PO CAPS: 30.0000 mg | ORAL_CAPSULE | Freq: Every day | ORAL | 1 refills | Status: DC

## 2016-11-03 NOTE — Progress Notes (Signed)
   Subjective:    Patient ID: Melissa Barajas, female    DOB: 19-Sep-1940, 76 y.o.   MRN: [id number]  HPI  The patient comes in today for a wellness visit. Patient gets female care thru GYN- has mammo already scheduled thru GYN   A review of their health history was completed.  A review of medications was also completed.  Any needed refills; pharmacy will notify us  Eating habits: trying to eat healthy  Falls/  MVA accidents in past few months: none  Regular exercise: silver sneakers 2-3 times a week  Specialist pt sees on regular basis: dr fields  Preventative health issues were discussed.  Should be noted that the patient initially came in for a wellness exam but it was determined that she is seen her gynecologist in the near future for a female exam as well as mammogram so therefore this is not a wellness exam. We did discuss healthy eating regular physical activity in the importance of losing some weight Patient recently had comprehensive lab work including kidney function liver function thyroid as well as cholesterol all of this was reviewed in detail She does take her diuretic on a regular basis. She does take her blood pressure medicine on a regular basis She also states she's only been taking a potassium once a day She was only taking her cholesterol occasionally she is now non-take her medicine on a regular basis She does have osteoarthritis of the knees for which anti-inflammatory does a good job She does not take any narcotic pain medicines currently She does take her reflux medicine on a regular basis Additional concerns: still having breathing issues since gaining weight had breathing test for this issue but it continues   Review of Systems Denies headaches blurred vision dysphagia heart palpitations denies chest tightness pressure pain shortness breath denies sweats chills    Objective:   Physical Exam lungs clear hearts regular pulse normal extremities no edema skin  warm dry neurologic grossly normal      Assessment & Plan:  Blood pressure good control continue current measures  Hypokalemia recheck potassium continue potassium 2 per day  Hyperlipidemia-patient stopped taking her statin she will restart it and work hard on diet  Mild obesity patient will work on losing 10 pounds over the next 6 months  Patient getting her wellness through [facility]  Osteoarthritis of the knees taking Lodine not causing any GI effects  Reflux issues followed by GI on omeprazole  Prediabetes good control  Follow-up September or October  Previous labs reviewed with patient including lipid liver

## 2016-11-03 NOTE — Patient Instructions (Addendum)
From a wellness perspective of overall health I feel you're doing fairly good. I do believe it is in your best interest to work hard at being healthy with your food choices minimizing starches and high-calorie foods. Eat healthy with vegetables fruits and lean meats. Tried to be mindful of portion sizes and snacking. If possible over the next 6 months reduce your weight by 10 pounds  I would like for you to do your chest x-ray. In addition to this you may use albuterol when necessary for any wheezing please call us if you need an inhaler.DASH Eating Plan DASH stands for "Dietary Approaches to Stop Hypertension." The DASH eating plan is a healthy eating plan that has been shown to reduce high blood pressure (hypertension). Additional health benefits may include reducing the risk of type 2 diabetes mellitus, heart disease, and stroke. The DASH eating plan may also help with weight loss. What do I need to know about the DASH eating plan? For the DASH eating plan, you will follow these general guidelines:  Choose foods with less than 150 milligrams of sodium per serving (as listed on the food label).  Use salt-free seasonings or herbs instead of table salt or sea salt.  Check with your health care provider or pharmacist before using salt substitutes.  Eat lower-sodium products. These are often labeled as "low-sodium" or "no salt added."  Eat fresh foods. Avoid eating a lot of canned foods.  Eat more vegetables, fruits, and low-fat dairy products.  Choose whole grains. Look for the word "whole" as the first word in the ingredient list.  Choose fish and skinless chicken or [place] more often than red meat. Limit fish, poultry, and meat to 6 oz (170 g) each day.  Limit sweets, desserts, sugars, and sugary drinks.  Choose heart-healthy fats.  Eat more home-cooked food and less restaurant, buffet, and fast food.  Limit fried foods.  Do not fry foods. Cook foods using methods such as baking,  boiling, grilling, and broiling instead.  When eating at a restaurant, ask that your food be prepared with less salt, or no salt if possible. What foods can I eat? Seek help from a dietitian for individual calorie needs. Grains  Whole grain or whole wheat bread. Brown rice. Whole grain or whole wheat pasta. Quinoa, bulgur, and whole grain cereals. Low-sodium cereals. Corn or whole wheat flour tortillas. Whole grain cornbread. Whole grain crackers. Low-sodium crackers. Vegetables  Fresh or frozen vegetables (raw, steamed, roasted, or grilled). Low-sodium or reduced-sodium tomato and vegetable juices. Low-sodium or reduced-sodium tomato sauce and paste. Low-sodium or reduced-sodium canned vegetables. Fruits  All fresh, canned (in natural juice), or frozen fruits. Meat and Other Protein Products  Ground beef (85% or leaner), grass-fed beef, or beef trimmed of fat. Skinless chicken or [place]. Ground chicken or [place]. Pork trimmed of fat. All fish and seafood. Eggs. Dried beans, peas, or lentils. Unsalted nuts and seeds. Unsalted canned beans. Dairy  Low-fat dairy products, such as skim or 1% milk, 2% or reduced-fat cheeses, low-fat ricotta or cottage cheese, or plain low-fat yogurt. Low-sodium or reduced-sodium cheeses. Fats and Oils  Tub margarines without trans fats. Light or reduced-fat mayonnaise and salad dressings (reduced sodium). Avocado. Safflower, olive, or canola oils. Natural peanut or almond butter. Other  Unsalted popcorn and pretzels. The items listed above may not be a complete list of recommended foods or beverages. Contact your dietitian for more options.  What foods are not recommended? Grains  White bread. White pasta. White  rice. Refined cornbread. Bagels and croissants. Crackers that contain trans fat. Vegetables  Creamed or fried vegetables. Vegetables in a cheese sauce. Regular canned vegetables. Regular canned tomato sauce and paste. Regular tomato and vegetable  juices. Fruits  Canned fruit in light or heavy syrup. Fruit juice. Meat and Other Protein Products  Fatty cuts of meat. Ribs, chicken wings, bacon, sausage, bologna, salami, chitterlings, fatback, hot dogs, bratwurst, and packaged luncheon meats. Salted nuts and seeds. Canned beans with salt. Dairy  Whole or 2% milk, cream, half-and-half, and cream cheese. Whole-fat or sweetened yogurt. Full-fat cheeses or blue cheese. Nondairy creamers and whipped toppings. Processed cheese, cheese spreads, or cheese curds. Condiments  Onion and garlic salt, seasoned salt, table salt, and sea salt. Canned and packaged gravies. Worcestershire sauce. Tartar sauce. Barbecue sauce. Teriyaki sauce. Soy sauce, including reduced sodium. Steak sauce. Fish sauce. Oyster sauce. Cocktail sauce. Horseradish. Ketchup and mustard. Meat flavorings and tenderizers. Bouillon cubes. Hot sauce. Tabasco sauce. Marinades. Taco seasonings. Relishes. Fats and Oils  Butter, stick margarine, lard, shortening, ghee, and bacon fat. Coconut, palm kernel, or palm oils. Regular salad dressings. Other  Pickles and olives. Salted popcorn and pretzels. The items listed above may not be a complete list of foods and beverages to avoid. Contact your dietitian for more information.  Where can I find more information? National Heart, Lung, and Blood Institute: travelstabloid.com This information is not intended to replace advice given to you by your health care provider. Make sure you discuss any questions you have with your health care provider. Document Released: 08/17/2011 Document Revised: 02/03/2016 Document Reviewed: 07/02/2013 Elsevier Interactive Patient Education  2017 [employer].   It is necessary to repeat your metabolic 7 again in 3-4 weeks to make sure that your potassium is doing better.  Please follow-up in approximately 6 months.

## 2016-11-07 DIAGNOSIS — H16223 Keratoconjunctivitis sicca, not specified as Sjogren's, bilateral: Secondary | ICD-10-CM | POA: Diagnosis not present

## 2016-11-07 DIAGNOSIS — H52223 Regular astigmatism, bilateral: Secondary | ICD-10-CM | POA: Diagnosis not present

## 2016-11-07 DIAGNOSIS — H524 Presbyopia: Secondary | ICD-10-CM | POA: Diagnosis not present

## 2016-11-07 DIAGNOSIS — H5203 Hypermetropia, bilateral: Secondary | ICD-10-CM | POA: Diagnosis not present

## 2016-12-01 ENCOUNTER — Encounter: Payer: Self-pay | Admitting: Family Medicine

## 2016-12-01 DIAGNOSIS — Z1231 Encounter for screening mammogram for malignant neoplasm of breast: Secondary | ICD-10-CM | POA: Diagnosis not present

## 2016-12-04 ENCOUNTER — Telehealth: Payer: Self-pay | Admitting: Family Medicine

## 2016-12-04 NOTE — Telephone Encounter (Signed)
Review screening mammogram results in results folder. °

## 2016-12-07 ENCOUNTER — Other Ambulatory Visit: Payer: Self-pay | Admitting: Family Medicine

## 2016-12-10 ENCOUNTER — Other Ambulatory Visit: Payer: Self-pay | Admitting: Family Medicine

## 2016-12-11 NOTE — Telephone Encounter (Signed)
Mammogram was normal, the mammogram center was sending the patient a letter regarding that

## 2016-12-22 ENCOUNTER — Ambulatory Visit (INDEPENDENT_AMBULATORY_CARE_PROVIDER_SITE_OTHER): Payer: Medicare Other | Admitting: Family Medicine

## 2016-12-22 ENCOUNTER — Ambulatory Visit (HOSPITAL_COMMUNITY)
Admission: RE | Admit: 2016-12-22 | Discharge: 2016-12-22 | Disposition: A | Discharge status: Home / Self Care | Payer: Medicare Other | Source: Ambulatory Visit | Attending: Family Medicine | Admitting: Family Medicine

## 2016-12-22 ENCOUNTER — Encounter: Payer: Self-pay | Admitting: Family Medicine

## 2016-12-22 VITALS — BP 120/74 | Ht 66.0 in | Wt 180.2 lb

## 2016-12-22 DIAGNOSIS — M5441 Lumbago with sciatica, right side: Secondary | ICD-10-CM | POA: Diagnosis not present

## 2016-12-22 DIAGNOSIS — M25551 Pain in right hip: Secondary | ICD-10-CM | POA: Diagnosis not present

## 2016-12-22 DIAGNOSIS — Z96651 Presence of right artificial knee joint: Secondary | ICD-10-CM | POA: Insufficient documentation

## 2016-12-22 DIAGNOSIS — R109 Unspecified abdominal pain: Secondary | ICD-10-CM | POA: Diagnosis not present

## 2016-12-22 DIAGNOSIS — M79604 Pain in right leg: Secondary | ICD-10-CM

## 2016-12-22 DIAGNOSIS — E876 Hypokalemia: Secondary | ICD-10-CM | POA: Diagnosis not present

## 2016-12-22 DIAGNOSIS — M545 Low back pain: Secondary | ICD-10-CM | POA: Diagnosis not present

## 2016-12-22 DIAGNOSIS — M898X5 Other specified disorders of bone, thigh: Secondary | ICD-10-CM | POA: Insufficient documentation

## 2016-12-22 DIAGNOSIS — M5136 Other intervertebral disc degeneration, lumbar region: Secondary | ICD-10-CM | POA: Diagnosis not present

## 2016-12-22 NOTE — Progress Notes (Signed)
   Subjective:    Patient ID: Melissa Barajas, female    DOB: Jan 31, 1941, 76 y.o.   MRN: [id number]  Leg Pain   The incident occurred more than 1 week ago. The pain is present in the right thigh and right hip.  hits her at night Turn overing helps Pain lasts 1 min or less but very intense No different activities Does go to [facility] to 4 nights per week She states she gets severe pain in the right thigh region on the side it was mid thigh now it's around the hip region For 30 seconds at a time it does not occur at other times. It wakes her up in the middle the night she repositions and it will get better.  She also relates intermittent midabdominal pain maybe once or twice a week for only a few minutes at a time does not radiate anywhere stays in the mid abdomen does not wake her up at night is more likely to occur in the evening time she is up-to-date on colonoscopy. She states occasionally gets constipated but denies any heavy blood. She states occasionally when she is severely constipated she might get home little bit of blood when she wipes  Also has concerns of pain to abdomen.  Review of Systems Denies any chest tightness pressure pain denies hematuria rectal bleeding denies sweats chills vomiting.    Objective:   Physical Exam Lungs clear respiratory rate normal heart normal pulse normal BP good today abdomen is soft slight midabdominal tenderness extremities no edema skin warm dry neurologic grossly normal  Patient had CAT scan 1 year ago which was negative for any type of growths or tumor, patient's colonoscopy 2009    25 minutes was spent with the patient. Greater than half the time was spent in discussion and answering questions and counseling regarding the issues that the patient came in for today.  Assessment & Plan:  Midabdominal pain-nonspecific will run some lab work. I do not feel she needs another scan we will check stool for blood  Right leg pain it is possible this  could be intermittent sciatica it is also possible this could be related to the bone we will run some lab work but we will also do some x-rays as well and await the results of this.

## 2016-12-23 LAB — BASIC METABOLIC PANEL
BUN/Creatinine Ratio: 18 (ref 12–28)
BUN: 15 mg/dL (ref 8–27)
CALCIUM: 10.3 mg/dL (ref 8.7–10.3)
CO2: 33 mmol/L — AB (ref 18–29)
CREATININE: 0.85 mg/dL (ref 0.57–1.00)
Chloride: 95 mmol/L — ABNORMAL LOW (ref 96–106)
GFR calc Af Amer: 79 mL/min/{1.73_m2} (ref >59)
GFR, EST NON AFRICAN AMERICAN: 68 mL/min/{1.73_m2} (ref >59)
Glucose: 105 mg/dL — ABNORMAL HIGH (ref 65–99)
Potassium: 3.8 mmol/L (ref 3.5–5.2)
Sodium: 142 mmol/L (ref 134–144)

## 2016-12-23 LAB — CBC WITH DIFFERENTIAL/PLATELET
BASOS: 1 %
Basophils Absolute: 0.1 10*3/uL (ref 0.0–0.2)
EOS (ABSOLUTE): 0.3 10*3/uL (ref 0.0–0.4)
EOS: 5 %
HEMATOCRIT: 40.6 % (ref 34.0–46.6)
HEMOGLOBIN: 13.6 g/dL (ref 11.1–15.9)
IMMATURE GRANS (ABS): 0 10*3/uL (ref 0.0–0.1)
IMMATURE GRANULOCYTES: 0 %
LYMPHS: 35 %
Lymphocytes Absolute: 2.5 10*3/uL (ref 0.7–3.1)
MCH: 31.8 pg (ref 26.6–33.0)
MCHC: 33.5 g/dL (ref 31.5–35.7)
MCV: 95 fL (ref 79–97)
MONOCYTES: 11 %
MONOS ABS: 0.8 10*3/uL (ref 0.1–0.9)
Neutrophils Absolute: 3.4 10*3/uL (ref 1.4–7.0)
Neutrophils: 48 %
Platelets: 232 10*3/uL (ref 150–379)
RBC: 4.28 x10E6/uL (ref 3.77–5.28)
RDW: 14.8 % (ref 12.3–15.4)
WBC: 7.1 10*3/uL (ref 3.4–10.8)

## 2016-12-23 LAB — LIPASE: Lipase: 43 U/L (ref 14–85)

## 2016-12-23 LAB — HEPATIC FUNCTION PANEL
ALBUMIN: 4.6 g/dL (ref 3.5–4.8)
ALT: 18 IU/L (ref 0–32)
AST: 28 IU/L (ref 0–40)
Alkaline Phosphatase: 78 IU/L (ref 39–117)
Bilirubin Total: 0.5 mg/dL (ref 0.0–1.2)
Bilirubin, Direct: 0.18 mg/dL (ref 0.00–0.40)
TOTAL PROTEIN: 7.7 g/dL (ref 6.0–8.5)

## 2016-12-23 LAB — SEDIMENTATION RATE: Sed Rate: 16 mm/hr (ref 0–40)

## 2017-01-03 ENCOUNTER — Other Ambulatory Visit: Payer: Self-pay

## 2017-01-03 DIAGNOSIS — M549 Dorsalgia, unspecified: Secondary | ICD-10-CM

## 2017-01-09 ENCOUNTER — Encounter: Payer: Self-pay | Admitting: Family Medicine

## 2017-01-23 ENCOUNTER — Other Ambulatory Visit: Payer: Self-pay | Admitting: Family Medicine

## 2017-01-23 NOTE — Telephone Encounter (Signed)
Duplicate

## 2017-01-23 NOTE — Telephone Encounter (Signed)
This plus t 3 refills

## 2017-02-12 ENCOUNTER — Other Ambulatory Visit: Payer: Self-pay | Admitting: Family Medicine

## 2017-02-12 ENCOUNTER — Encounter: Payer: Self-pay | Admitting: Family Medicine

## 2017-02-12 ENCOUNTER — Ambulatory Visit (INDEPENDENT_AMBULATORY_CARE_PROVIDER_SITE_OTHER): Payer: Medicare Other | Admitting: Family Medicine

## 2017-02-12 VITALS — BP 140/74 | Ht 66.0 in | Wt 174.4 lb

## 2017-02-12 DIAGNOSIS — S29019A Strain of muscle and tendon of unspecified wall of thorax, initial encounter: Secondary | ICD-10-CM

## 2017-02-12 MED ORDER — DICLOFENAC SODIUM 75 MG PO TBEC: DELAYED_RELEASE_TABLET | ORAL | 1 refills | Status: DC

## 2017-02-12 NOTE — Progress Notes (Signed)
   Subjective:    Patient ID: Melissa Barajas, female    DOB: Jun 10, 1941, 76 y.o.   MRN: [id number]  Back Pain  This is a recurrent problem. The current episode started in the past 7 days. The pain is present in the thoracic spine. The quality of the pain is described as stabbing.   Back in April worse with movemnt  At times a fairly sharp pain  No cough or congestion  Took aleave as need and tylenol, did not do much   Patient also has concerns of low energy, and dizziness.  Review of Systems  Musculoskeletal: Positive for back pain.       Objective:   Physical Exam  Alert vitals stable, NAD. Blood pressure good on repeat. HEENT normal. Lungs clear. Heart regular rate and rhythm. Spine nontender. No CVA tenderness. Region of discomfort is right sub-. Scapular region. Some slight tenderness with deep palpation      Assessment & Plan:  Impression subacute thoracic strain plan local measures discussed symptom care discussed expect slow resolution no need for x-rays at this time

## 2017-02-20 ENCOUNTER — Ambulatory Visit: Payer: Medicare Other | Admitting: Obstetrics & Gynecology

## 2017-02-22 ENCOUNTER — Telehealth: Payer: Self-pay | Admitting: Family Medicine

## 2017-02-22 NOTE — Telephone Encounter (Signed)
Form in yellow folder in Dr.Scott's office  

## 2017-02-22 NOTE — Telephone Encounter (Signed)
Pt dropped off a handicap placard form to be filled out. Form is in nurse box.  

## 2017-02-23 NOTE — Telephone Encounter (Signed)
Form completed - thank you

## 2017-02-26 ENCOUNTER — Encounter: Payer: Self-pay | Admitting: Obstetrics & Gynecology

## 2017-02-26 ENCOUNTER — Other Ambulatory Visit: Payer: Self-pay | Admitting: Obstetrics & Gynecology

## 2017-02-26 ENCOUNTER — Ambulatory Visit (INDEPENDENT_AMBULATORY_CARE_PROVIDER_SITE_OTHER): Payer: Medicare Other | Admitting: Obstetrics & Gynecology

## 2017-02-26 VITALS — BP 100/60 | HR 60 | Wt 176.4 lb

## 2017-02-26 DIAGNOSIS — N8111 Cystocele, midline: Secondary | ICD-10-CM

## 2017-02-26 DIAGNOSIS — N993 Prolapse of vaginal vault after hysterectomy: Secondary | ICD-10-CM

## 2017-02-26 DIAGNOSIS — N3941 Urge incontinence: Secondary | ICD-10-CM | POA: Diagnosis not present

## 2017-02-26 MED ORDER — OXYBUTYNIN CHLORIDE 5 MG PO TABS: 5.0000 mg | ORAL_TABLET | Freq: Three times a day (TID) | ORAL | 1 refills | Status: DC

## 2017-02-26 MED ORDER — ESTROGENS, CONJUGATED 0.625 MG/GM VA CREA: TOPICAL_CREAM | VAGINAL | 12 refills | Status: DC

## 2017-02-26 NOTE — Progress Notes (Signed)
Chief Complaint  Melissa Barajas presents with  . Vaginal Prolapse    Feels like something is stopping vagina up    Blood pressure 100/60, pulse 60, weight 176 lb 6.4 oz (80 kg).  76 y.o. [id number] No LMP recorded. Melissa Barajas has had a hysterectomy. The current method of family planning is status post hysterectomy.  Outpatient Encounter Prescriptions as of 02/26/2017  Medication Sig  . Biotin (BIOTIN MAXIMUM STRENGTH) 10 MG TABS Take 1 tablet by mouth daily.   . Calcium Carbonate-Vitamin D (CALTRATE 600+D PO) Take 1 tablet by mouth daily.   Marland Kitchen CARTIA XT 300 MG 24 hr capsule TAKE 1 CAPSULE BY MOUTH EVERY DAY  . dicyclomine (BENTYL) 20 MG tablet Take one every 6-8 hours as needed for abdominal cramps  . etodolac (LODINE) 400 MG tablet Take 1 tablet (400 mg total) by mouth 2 (two) times daily.  . indapamide (LOZOL) 2.5 MG tablet TAKE 1 TABLET(2.5 MG) BY MOUTH DAILY  . OVER THE COUNTER MEDICATION Take 1 tablet by mouth daily. Focus factor  . potassium chloride (K-DUR) 10 MEQ tablet TAKE 1 TABLET(10 MEQ) BY MOUTH TWICE DAILY  . pravastatin (PRAVACHOL) 80 MG tablet TAKE 1 TABLET(80 MG) BY MOUTH DAILY  . Probiotic Product (PROBIOTIC DAILY PO) Take 1 tablet by mouth daily.   . temazepam (RESTORIL) 30 MG capsule TAKE 1 CAPSULE BY MOUTH EVERY DAY AT BEDTIME  . [DISCONTINUED] OVER THE COUNTER MEDICATION Take 1 tablet by mouth daily. Reported on 03/16/2016  . conjugated estrogens (PREMARIN) vaginal cream Use every other night, 1 gram  . diclofenac (VOLTAREN) 75 MG EC tablet Take 1 tablet by mouth twice daily with food. (Melissa Barajas not taking: Reported on 02/26/2017)  . ondansetron (ZOFRAN ODT) 4 MG disintegrating tablet 4mg  ODT q4 hours prn nausea/vomit  . oxybutynin (DITROPAN) 5 MG tablet Take 1 tablet (5 mg total) by mouth 3 (three) times daily.  . [DISCONTINUED] diltiazem (CARTIA XT) 300 MG 24 hr capsule TAKE 1 CAPSULE BY MOUTH EVERY DAY  . [DISCONTINUED] omeprazole (PRILOSEC OTC) 20 MG tablet Take 20 mg by  mouth daily.   No facility-administered encounter medications on file as of 02/26/2017.     Subjective Melissa Barajas presents today with a complaint of feeling like something is stopping her vagina up She states is been this way for sometimes on the order of weeks to months She says she can feel something when she wipes Sometimes some pressure and sometimes some sharp pain No constipation Chronic frequency and urgency of urination which we've addressed in the past without success No radiation of pain No other associated symptoms No vaginal bleeding She rates the discomfort as mild seldom moderate  Objective General WDWN female NAD Vulva:  normal appearing vulva with no masses, tenderness or lesions Vagina:  Combination of vaginal vault/apex prolpase which is dragging her bladdr down also, cystocoele, no rectocoele Cervix:  absent Uterus:  uterus absent Adnexa: ovaries:not present,     Pertinent ROS No burning with urination, Positive frequency and urgency No nausea, vomiting or diarrhea Nor fever chills or other constitutional symptoms   Labs or studies No new none reviewe    Impression Diagnoses this Encounter::   ICD-10-CM   1. 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 Urge incontinence N39.41     Established relevant diagnosis(es):   Plan/Recommendations: Meds ordered this encounter  Medications  . oxybutynin (DITROPAN) 5 MG tablet  Sig: Take 1 tablet (5 mg total) by mouth 3 (three) times daily.    Dispense:  90 tablet    Refill:  1  . conjugated estrogens (PREMARIN) vaginal cream    Sig: Use every other night, 1 gram    Dispense:  30 g    Refill:  12    Labs or Scans Ordered: No orders of the defined types were placed in this encounter.   Management:: Melissa Barajas has a grade 3 vaginal apex prolapse after hysterectomy as well as a secondary grade 3 cystocele Ordinarily I will try to put  a pessary in this Melissa Barajas but she has anterior to posterior vaginal wall synechiae probably from atrophy which would make putting in a pessary and at this point and possible I've given her samples of Premarin vaginal cream which she will use 1 g every other night to see if we can improve this I'm also going to place her on ditropan 5 mg 3 times a day to see if we can improve her urge incontinence and frequency Unfortunately she I did put her under better couple years ago but that made her nauseated so she generally take it Hopefully the ditropan will workout better I will see her back in 1 month for follow-up and see if the vaginal tissue and synechiae are improved and we can place a pessary Additionally she has a rather foreshortened vagina As far she knows she did not undergo any sort of bladder cystocele repair when she had her hysterectomy  Follow up Return in about 1 month (around 03/28/2017) for Follow up, with Dr Elonda Husky.         All questions were answered.  Past Medical History:  Diagnosis Date  . Arthritis   . Atrial fibrillation ([place])   . Collagen vascular disease ([place])   . DIVERTICULOSIS OF COLON 08/25/2010   Qualifier: Diagnosis of  By: Hoy Morn    . GERD (gastroesophageal reflux disease)   . HEMORRHOIDS, INTERNAL 08/25/2010   Qualifier: Diagnosis of  By: Hoy Morn    . Hypercholesteremia   . Hypertension   . Lipoma of arm    Left    Past Surgical History:  Procedure Laterality Date  . ABDOMINAL HYSTERECTOMY     Partial  . COLONOSCOPY  2009   SLF: 1. screening colonoscopy in 10 years 2. She should follow a high fiber diet She has been given a hand out on high fiber diverticulosois and hemorrhoids  . CYSTECTOMY     Left breast  . ESOPHAGOGASTRODUODENOSCOPY     VOZ:[id number] appearing Schatzki's ring, possible cervical esophageal web, both dilated with passage of a Maloney dilator/small hiatal hernia  . ESOPHAGOGASTRODUODENOSCOPY N/A 10/25/2015    Procedure: ESOPHAGOGASTRODUODENOSCOPY (EGD);  Surgeon: Danie Binder, MD;  [facility];  Service: Endoscopy;  Laterality: N/A;  1215pm  . SAVORY DILATION N/A 10/25/2015   Procedure: SAVORY DILATION;  Surgeon: Danie Binder, MD;  Location: [facility];  Service: Endoscopy;  Laterality: N/A;  . THYROIDECTOMY    . Tumor   05/2010   Left breast under nipple  . 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  Hydrocodone Nausea Only  . Lisinopril     Angio edema  . Naprosyn [Naproxen]     nausea  . Vioxx [Rofecoxib]     Social History   Social History  . Marital status: Married    Spouse name: N/A  . Number of children: N/A  . Years of education: N/A   Social History Main Topics  . Smoking status: Former Smoker    Start date: 09/12/1983    Quit date: 09/11/1993  . Smokeless tobacco: Never Used  . Alcohol use No  . Drug use: No  . Sexual activity: Not Asked   Other Topics Concern  . None   Social History Narrative  . None    Family History  Problem Relation Age of Onset  . Cancer Brother        Prostate

## 2017-03-06 ENCOUNTER — Encounter: Payer: Self-pay | Admitting: Gastroenterology

## 2017-03-06 IMAGING — DX DG HUMERUS 2V *L*
2 series · 2 of 2 positions shown · non-contrast
Comparison: None.

CLINICAL DATA: Left upper arm pain after fall in her basement
yesterday.

EXAM:
LEFT HUMERUS - 2+ VIEW

[humerus ap]
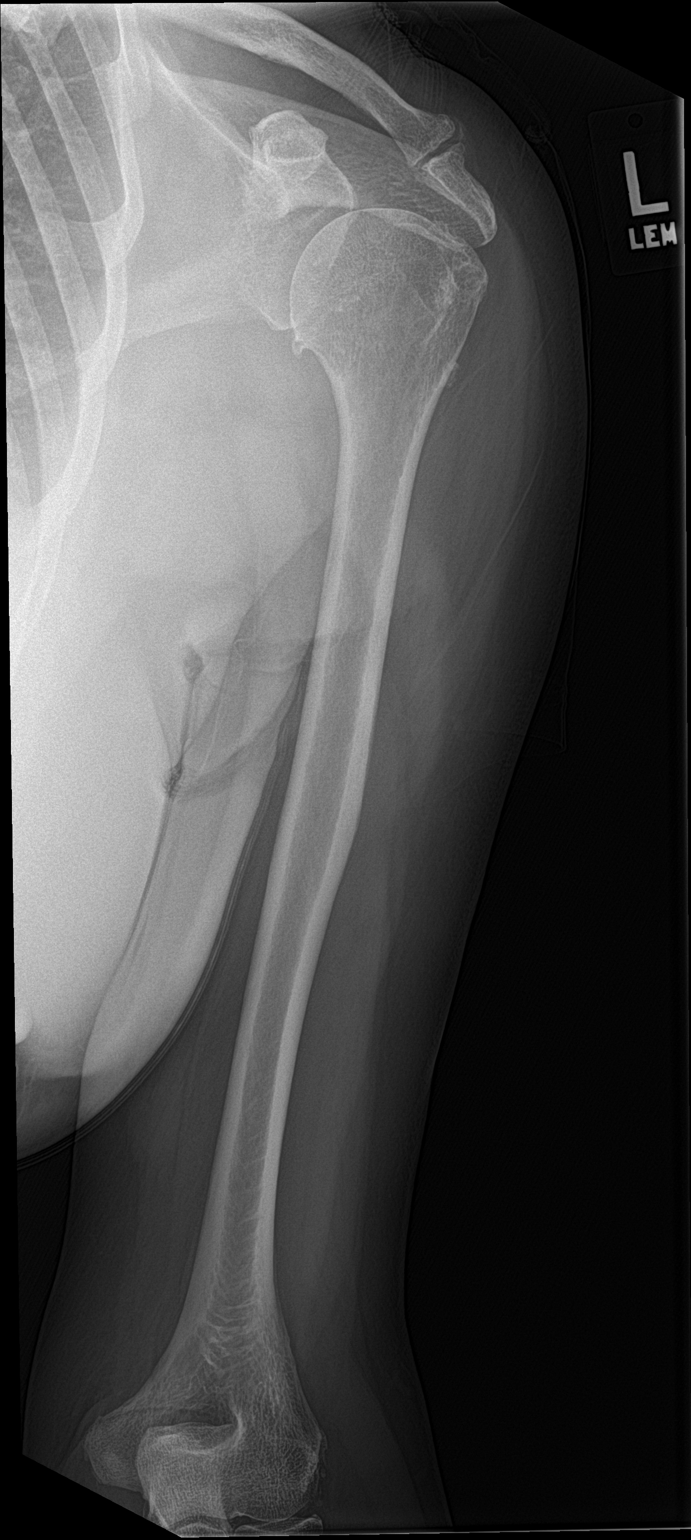

[humerus lat]
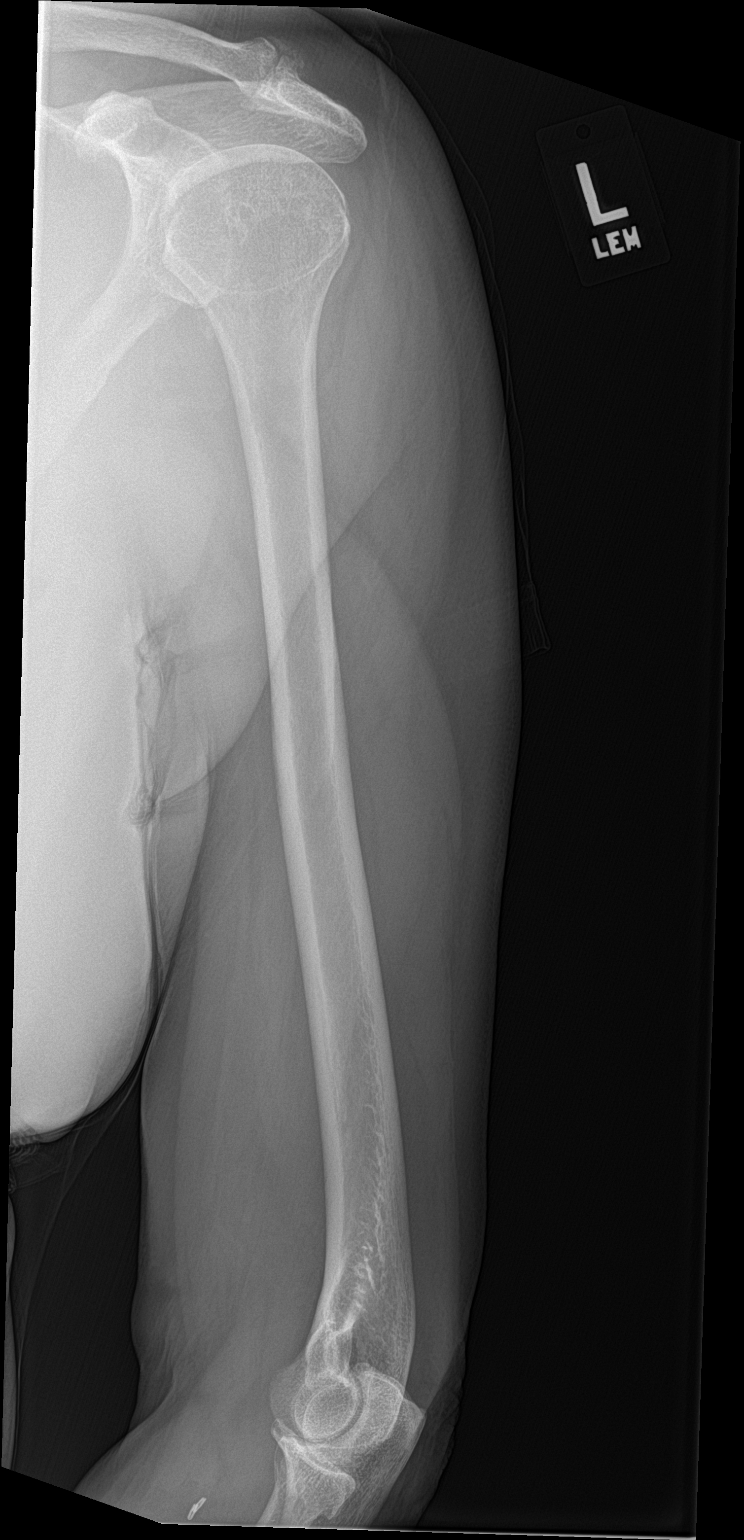

[2 of 2 positions shown; findings below may reference images not displayed]

FINDINGS: There is no evidence of fracture or other focal bone lesions. Soft
tissues are unremarkable.
IMPRESSION: Normal left humerus.

## 2017-03-06 IMAGING — MR MR ANKLE*R* W/O CM
5 series · 40 of 40 positions shown · non-contrast
Comparison: Plain films of the ankle 01/12/2008

CLINICAL DATA: Posterior right ankle pain for 6 weeks. No known
injury. Initial encounter.

EXAM:
MRI OF THE RIGHT ANKLE WITHOUT CONTRAST
TECHNIQUE: Multiplanar, multisequence MR imaging of the ankle was performed. No
intravenous contrast was administered.

[Series 3: pdfs axial · axial · 3.0mm · 0.47mm/px · z∈[-68,+54]mm · 10 of 35 slices shown]
[im 1/35]
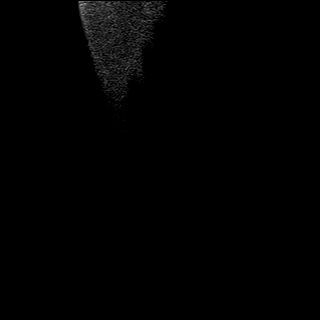
[im 4/35]
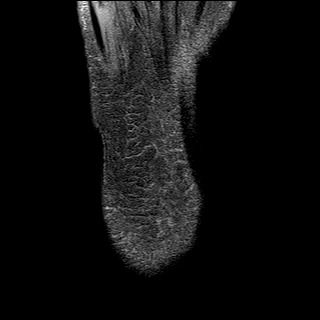
[im 8/35]
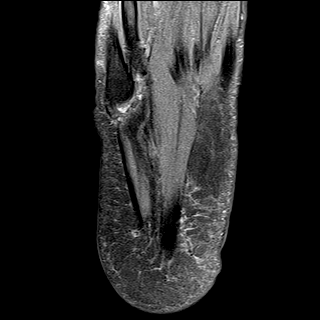
[im 12/35]
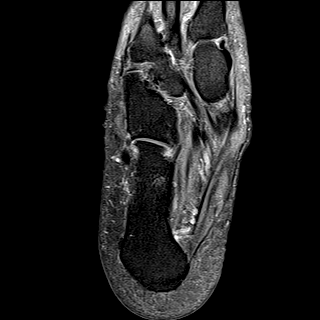
[im 16/35]
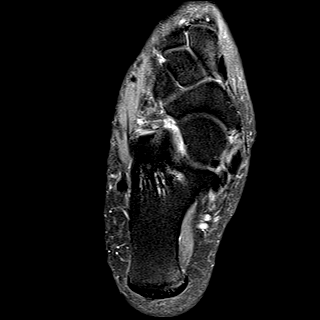
[im 19/35]
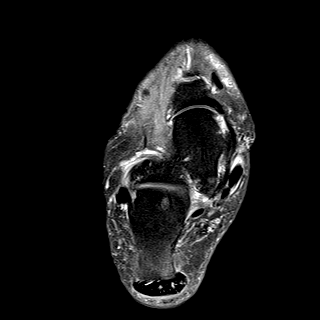
[im 23/35]
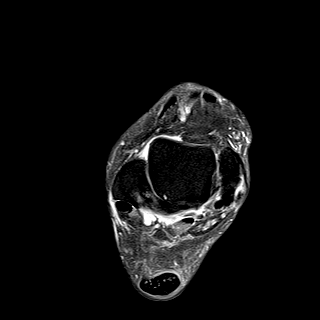
[im 27/35]
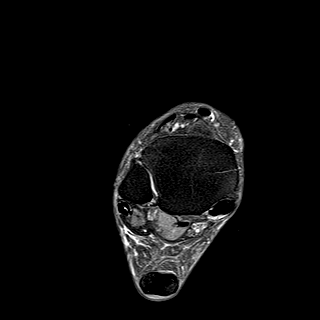
[im 31/35]
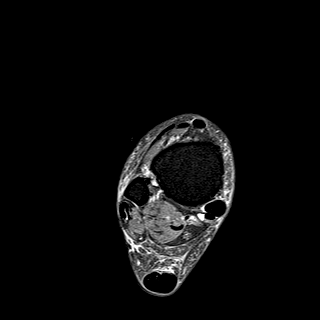
[im 35/35]
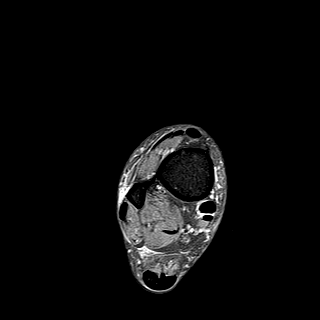

[Series 4: t2fs axial · axial · 3.0mm · 0.47mm/px · z∈[-68,+54]mm · 9 of 35 slices shown]
[im 1/35]
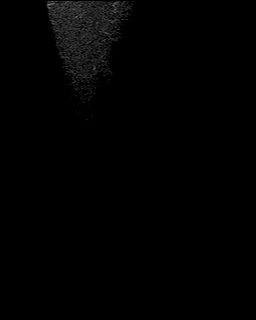
[im 5/35]
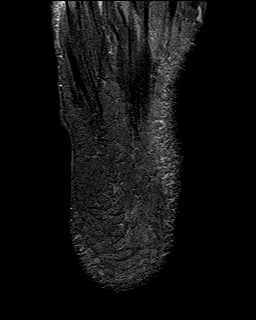
[im 9/35]
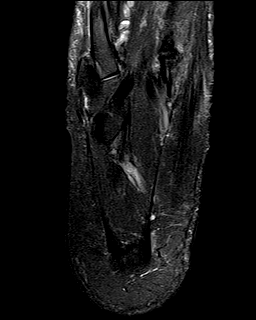
[im 13/35]
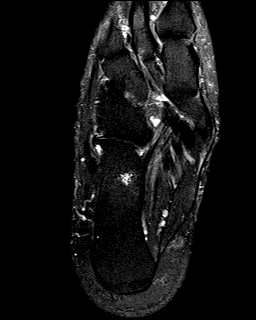
[im 18/35]
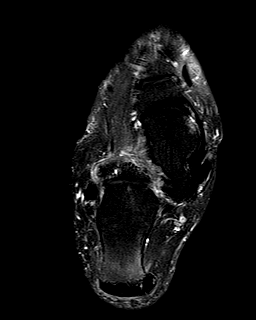
[im 22/35]
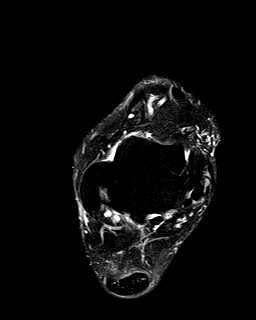
[im 26/35]
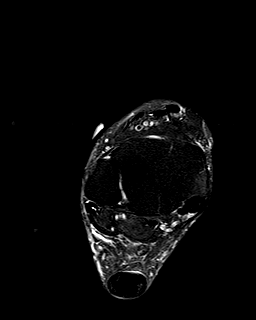
[im 30/35]
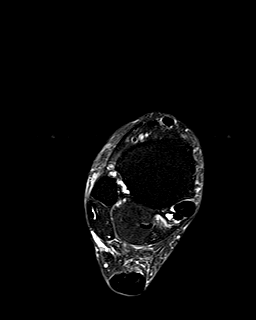
[im 35/35]
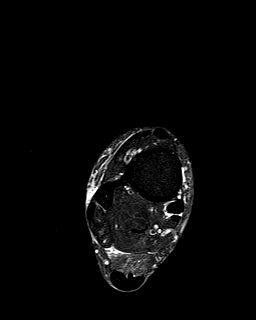

[Series 5: t2fs cor · coronal · 3.0mm · 0.47mm/px · 9 of 32 slices shown]
[im 1/32]
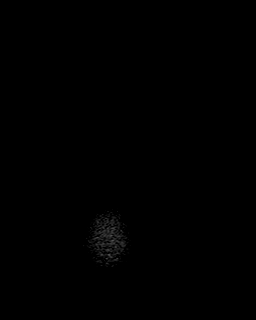
[im 4/32]
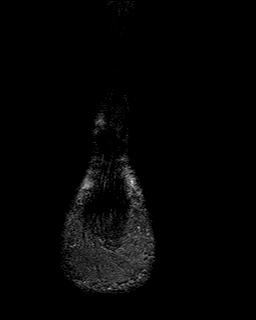
[im 8/32]
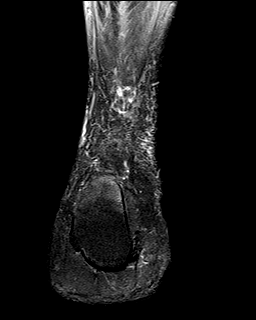
[im 12/32]
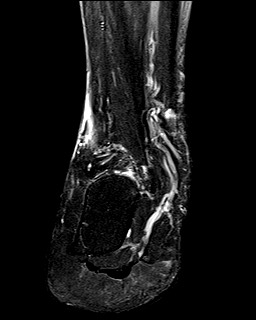
[im 16/32]
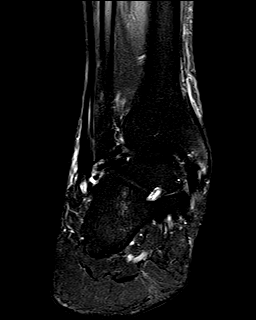
[im 20/32]
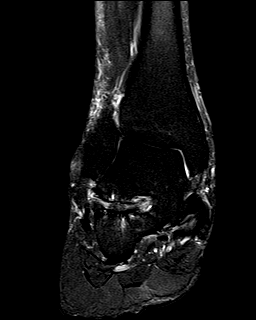
[im 24/32]
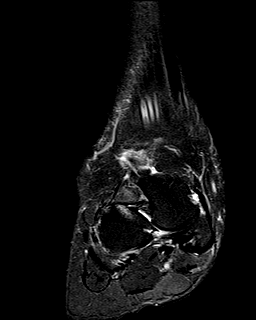
[im 28/32]
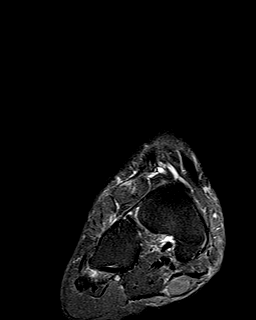
[im 32/32]
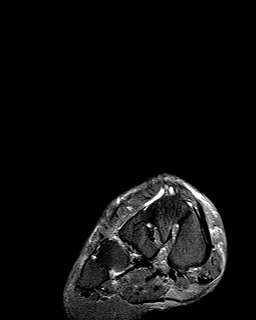

[Series 6: t2fs sag · sagittal · 3.0mm · 0.47mm/px · 6 of 24 slices shown]
[im 1/24]
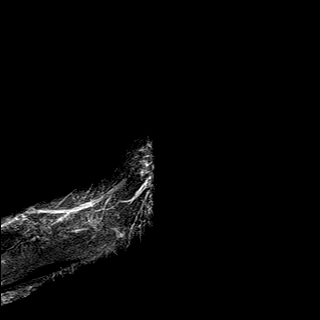
[im 5/24]
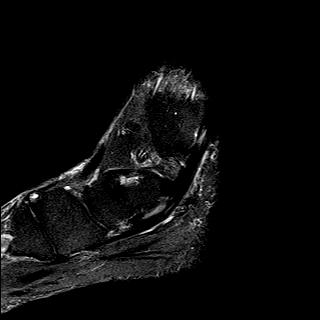
[im 10/24]
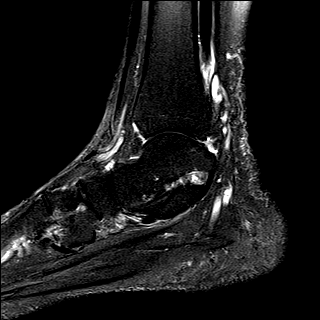
[im 14/24]
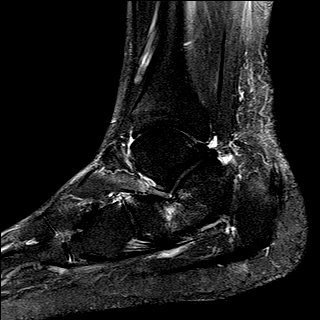
[im 19/24]
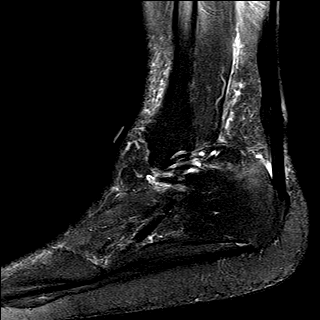
[im 24/24]
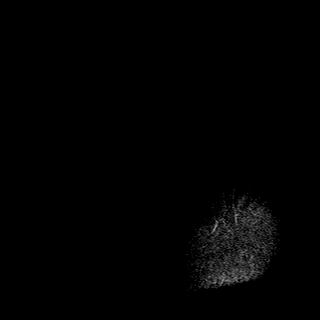

[Series 7: T1 · sagittal · 3.0mm · 0.47mm/px · 6 of 24 slices shown]
[im 1/24]
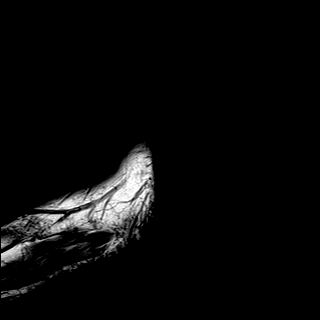
[im 5/24]
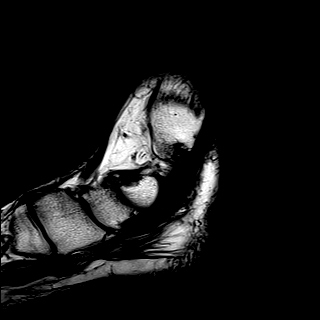
[im 10/24]
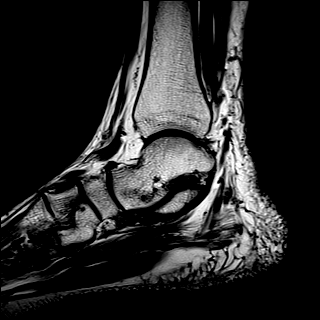
[im 14/24]
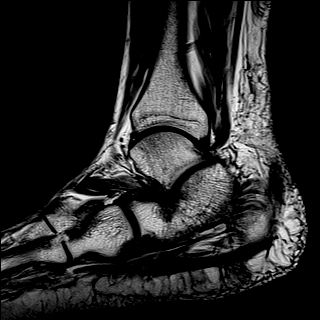
[im 19/24]
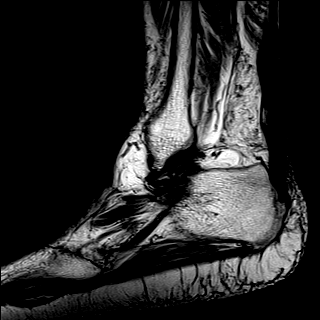
[im 24/24]
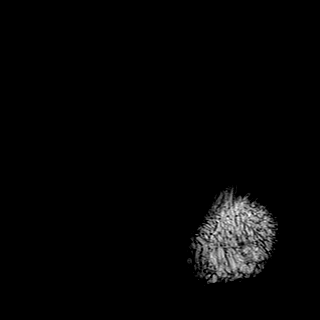

[40 of 40 positions shown; findings below may reference images not displayed]

FINDINGS: TENDONS

Peroneal: Intact.

Posteromedial: There is some fluid about the tibialis posterior
tendon with a short segment of intrasubstance increased T2 signal at
and just below the medial malleolus compatible with tendinosis and
small interstitial tear.

Anterior: Intact.

Achilles: The distal 6.5 cm of the tendon are thickened with
intrasubstance increased T2 signal and heterotopic ossification
present as seen on the comparison plain films. The patient has a
somewhat prominent dorsal process of the calcaneus with marrow edema
within it. A small volume of fluid is seen in the retrocalcaneal
bursa.

Plantar Fascia: Unremarkable.

LIGAMENTS

Lateral: Intact.

Medial: Intact.

CARTILAGE

Ankle Joint: Unremarkable.

Subtalar Joints/Sinus Tarsi: Unremarkable.

Bones: Vascular remnant in the anterior calcaneus is noted. No
fracture.

Other: None
IMPRESSION: Chronic Achilles tendinopathy without tear. Reactive marrow edema in
a somewhat prominent dorsal process of the calcaneus and changes of
retrocalcaneal bursitis are identified. Constellation of findings is
suggestive of Haglund's deformity.

Tendinosis and short segment interstitial tear tibialis posterior
tendon.

## 2017-03-08 ENCOUNTER — Other Ambulatory Visit: Payer: Self-pay | Admitting: Family Medicine

## 2017-03-19 ENCOUNTER — Telehealth: Payer: Self-pay | Admitting: *Deleted

## 2017-03-19 NOTE — Telephone Encounter (Signed)
Patient states she is out of Premarin sample and found "Norform" OTC suppository and wanted to try. Spoke with Dr Elonda Husky who was not familiar with the medication. Informed patient we did have samples, so I would put at front desk and she would pick up tomorrow.

## 2017-03-25 ENCOUNTER — Other Ambulatory Visit: Payer: Self-pay | Admitting: Family Medicine

## 2017-04-02 ENCOUNTER — Encounter: Payer: Self-pay | Admitting: Obstetrics & Gynecology

## 2017-04-02 ENCOUNTER — Ambulatory Visit (INDEPENDENT_AMBULATORY_CARE_PROVIDER_SITE_OTHER): Payer: Medicare Other | Admitting: Obstetrics & Gynecology

## 2017-04-02 VITALS — BP 148/90 | HR 72 | Wt 181.0 lb

## 2017-04-02 DIAGNOSIS — N3941 Urge incontinence: Secondary | ICD-10-CM

## 2017-04-02 DIAGNOSIS — N8111 Cystocele, midline: Secondary | ICD-10-CM

## 2017-04-02 DIAGNOSIS — N993 Prolapse of vaginal vault after hysterectomy: Secondary | ICD-10-CM | POA: Diagnosis not present

## 2017-05-03 ENCOUNTER — Ambulatory Visit (INDEPENDENT_AMBULATORY_CARE_PROVIDER_SITE_OTHER): Payer: Medicare Other | Admitting: Family Medicine

## 2017-05-03 ENCOUNTER — Encounter: Payer: Self-pay | Admitting: Family Medicine

## 2017-05-03 VITALS — BP 134/70 | Ht 66.0 in | Wt 177.0 lb

## 2017-05-03 DIAGNOSIS — I10 Essential (primary) hypertension: Secondary | ICD-10-CM

## 2017-05-03 DIAGNOSIS — E784 Other hyperlipidemia: Secondary | ICD-10-CM | POA: Diagnosis not present

## 2017-05-03 DIAGNOSIS — R490 Dysphonia: Secondary | ICD-10-CM

## 2017-05-03 DIAGNOSIS — R1312 Dysphagia, oropharyngeal phase: Secondary | ICD-10-CM | POA: Diagnosis not present

## 2017-05-03 DIAGNOSIS — E7849 Other hyperlipidemia: Secondary | ICD-10-CM

## 2017-05-03 DIAGNOSIS — Z79899 Other long term (current) drug therapy: Secondary | ICD-10-CM

## 2017-05-03 DIAGNOSIS — R739 Hyperglycemia, unspecified: Secondary | ICD-10-CM

## 2017-05-03 NOTE — Progress Notes (Signed)
   Subjective:    Patient ID: Melissa Barajas, female    DOB: 11-22-1940, 76 y.o.   MRN: [id number]  Hypertension  This is a chronic problem. The current episode started more than 1 year ago. Pertinent negatives include no chest pain, headaches or shortness of breath.   States she eats healthy,and gets exercise. She is have blood pressure issues for years takes her medicine tries eat healthy denies any particular side effects or problems Has had problem hyperglycemia also mild obesity she does try to exercise some she does try to watch her diet  Hyperlipidemia has history of this previous labs reviewed she does take her medicine denies side effects Insomnia she uses Restoril at nighttime to help with sleep she states without she will be able sleep at all She does relate some intermittent hoarseness over the past 4 months denies neck pain injury growths or weight loss or sweats She does relate significant dysphagia food getting stuck in the upper esophagus region she is had previous history of a web  25 minutes was spent with the patient. Greater than half the time was spent in discussion and answering questions and counseling regarding the issues that the patient came in for today.   Food gets stuck in her throat and stays hoarse.  Review of Systems  Constitutional: Negative for activity change, fatigue and fever.  HENT: Negative for congestion.   Respiratory: Negative for cough, chest tightness and shortness of breath.   Cardiovascular: Negative for chest pain and leg swelling.  Gastrointestinal: Negative for abdominal pain.  Skin: Negative for color change.  Neurological: Negative for headaches.  Psychiatric/Behavioral: Negative for behavioral problems.       Objective:   Physical Exam  Constitutional: She appears well-developed and well-nourished. No distress.  HENT:  Head: Normocephalic and atraumatic.  Eyes: Right eye exhibits no discharge. Left eye exhibits no discharge.    Neck: No tracheal deviation present.  Cardiovascular: Normal rate, regular rhythm and normal heart sounds.   No murmur heard. Pulmonary/Chest: Effort normal and breath sounds normal. No respiratory distress. She has no wheezes. She has no rales.  Musculoskeletal: She exhibits no edema.  Lymphadenopathy:    She has no cervical adenopathy.  Neurological: She is alert. She exhibits normal muscle tone.  Skin: Skin is warm and dry. No erythema.  Psychiatric: Her behavior is normal.  Vitals reviewed.         Assessment & Plan:  HTN decent control continue current measures Hyperglycemia check lab work watch diet stay active lose weight Hyperlipidemia previous labs reviewed continue medication watch diet Hoarseness referral to ENT for further evaluation more than likely reflux related Dysphagia has appointment with GI next month they may need to do EGD Patient to follow-up in 6 months

## 2017-05-04 LAB — HEPATIC FUNCTION PANEL
ALBUMIN: 4.5 g/dL (ref 3.5–4.8)
ALT: 16 IU/L (ref 0–32)
AST: 23 IU/L (ref 0–40)
Alkaline Phosphatase: 83 IU/L (ref 39–117)
Bilirubin Total: 0.6 mg/dL (ref 0.0–1.2)
Bilirubin, Direct: 0.18 mg/dL (ref 0.00–0.40)
TOTAL PROTEIN: 7.6 g/dL (ref 6.0–8.5)

## 2017-05-04 LAB — BASIC METABOLIC PANEL
BUN/Creatinine Ratio: 17 (ref 12–28)
BUN: 13 mg/dL (ref 8–27)
CALCIUM: 10.6 mg/dL — AB (ref 8.7–10.3)
CO2: 31 mmol/L — AB (ref 20–29)
CREATININE: 0.78 mg/dL (ref 0.57–1.00)
Chloride: 95 mmol/L — ABNORMAL LOW (ref 96–106)
GFR calc Af Amer: 87 mL/min/{1.73_m2} (ref >59)
GFR, EST NON AFRICAN AMERICAN: 76 mL/min/{1.73_m2} (ref >59)
GLUCOSE: 103 mg/dL — AB (ref 65–99)
Potassium: 3.9 mmol/L (ref 3.5–5.2)
Sodium: 142 mmol/L (ref 134–144)

## 2017-05-04 LAB — LIPID PANEL
CHOL/HDL RATIO: 4 ratio (ref 0.0–4.4)
Cholesterol, Total: 168 mg/dL (ref 100–199)
HDL: 42 mg/dL (ref >39)
LDL Calculated: 107 mg/dL — ABNORMAL HIGH (ref 0–99)
Triglycerides: 96 mg/dL (ref 0–149)
VLDL Cholesterol Cal: 19 mg/dL (ref 5–40)

## 2017-05-04 NOTE — Addendum Note (Signed)
Addended by: Launa Grill on: 05/04/2017 08:58 AM   Modules accepted: Orders

## 2017-05-08 ENCOUNTER — Encounter: Payer: Self-pay | Admitting: Family Medicine

## 2017-05-17 DIAGNOSIS — N8189 Other female genital prolapse: Secondary | ICD-10-CM | POA: Diagnosis not present

## 2017-05-17 DIAGNOSIS — R3915 Urgency of urination: Secondary | ICD-10-CM | POA: Diagnosis not present

## 2017-05-23 ENCOUNTER — Other Ambulatory Visit: Payer: Self-pay | Admitting: Family Medicine

## 2017-05-24 ENCOUNTER — Encounter: Payer: Self-pay | Admitting: Gastroenterology

## 2017-05-24 ENCOUNTER — Ambulatory Visit (INDEPENDENT_AMBULATORY_CARE_PROVIDER_SITE_OTHER): Payer: Medicare Other | Admitting: Gastroenterology

## 2017-05-24 DIAGNOSIS — R1313 Dysphagia, pharyngeal phase: Secondary | ICD-10-CM | POA: Insufficient documentation

## 2017-05-24 DIAGNOSIS — K219 Gastro-esophageal reflux disease without esophagitis: Secondary | ICD-10-CM | POA: Diagnosis not present

## 2017-05-24 DIAGNOSIS — K5904 Chronic idiopathic constipation: Secondary | ICD-10-CM | POA: Diagnosis not present

## 2017-05-24 NOTE — Progress Notes (Signed)
ON RECALL  °

## 2017-05-24 NOTE — Assessment & Plan Note (Signed)
DUE TO BONE SPURS IN YOUR CERVICAL SPINE. SYMPTOMS FAIRLY WELL CONTROLLED.  FOLLOW UP WITH DR. Benjamine Mola. MAKE SURE TO ASK DR. Benjamine Mola REVIEWS THE MODIFIED BARIUM SWALLOW FROM 2017. FOLLOW SOFT MECHANICAL DIET.  HANDOUT GIVEN. DRINK PLENTY OF LIQUIDS AFTER SWALLOWING SOLID FOOD. CRUSH LARGE PILLS IF POSSIBLE. FOLLOW UP IN 6 MOS.   GREATER THAN 50% WAS SPENT IN COUNSELING & COORDINATION OF CARE WITH THE PATIENT: DISCUSSED  PROCEDURE, BENEFITS, MECHNAISM, AND MANAGEMENT OF DYSPHAGIA DURING CERVICAL PHASE. TOTAL ENCOUNTER TIME: 29 MINS.

## 2017-05-24 NOTE — Assessment & Plan Note (Signed)
SYMPTOMS CONTROLLED/RESOLVED.  CONTINUE TO MONITOR SYMPTOMS. 

## 2017-05-24 NOTE — Progress Notes (Signed)
   Subjective:    Patient ID: Melissa Barajas, female    DOB: [birth year], 76 y.o.   MRN: [id number]  Kathyrn Drown, MD  HPI BMs: IF TAKES ABC MOVES GOOD. WAS HAVING ABDOMINAL PAIN(PRESSURE/STABBING/BURNING PAINS): 1ST OF July AND THROUGHOUT July. WAS EATING TOMATOES. NOW NOT HAVING ABDOMINAL PAIN. NO CONSTIPATION IF SHE EATS RIGHT. SOMETIMES SWALLOWS AND FEELS LIKE SOMETHING THERE. VOICE GETS RASPY AND FEELS LIKE FOOD STOPS AT BACK OF TONGUE. MBSS SHOWED MILD POOLING IN VALLECULAE AND IMPAIRED PASSING OF BARIUM TABLET. GETS CHOKED ON CRACKLIN.  PT DENIES FEVER, CHILLS, HEMATOCHEZIA, HEMATEMESIS, nausea, vomiting, melena, diarrhea, CHEST PAIN, SHORTNESS OF BREATH, CHANGE IN BOWEL IN HABITS, constipation, problems swallowing, ORheartburn or indigestion.  Past Medical History:  Diagnosis Date  . Arthritis   . Atrial fibrillation ([place])   . Collagen vascular disease ([place])   . DIVERTICULOSIS OF COLON 08/25/2010   Qualifier: Diagnosis of  By: Hoy Morn    . GERD (gastroesophageal reflux disease)   . HEMORRHOIDS, INTERNAL 08/25/2010   Qualifier: Diagnosis of  By: Hoy Morn    . Hypercholesteremia   . Hypertension   . Lipoma of arm    Left   Past Surgical History:  Procedure Laterality Date  . ABDOMINAL HYSTERECTOMY     Partial  . COLONOSCOPY  2009   SLF: 1. screening colonoscopy in 10 years 2. She should follow a high fiber diet She has been given a hand out on high fiber diverticulosois and hemorrhoids  . CYSTECTOMY     Left breast  . ESOPHAGOGASTRODUODENOSCOPY     HYQ:[id number] appearing Schatzki's ring, possible cervical esophageal web, both dilated with passage of a Maloney dilator/small hiatal hernia  . ESOPHAGOGASTRODUODENOSCOPY N/A 10/25/2015   Procedure: ESOPHAGOGASTRODUODENOSCOPY (EGD);  Surgeon: Danie Binder, MD;  [facility];  Service: Endoscopy;  Laterality: N/A;  1215pm  . SAVORY DILATION N/A 10/25/2015   Procedure: SAVORY DILATION;  Surgeon:  Danie Binder, MD;  [facility];  Service: Endoscopy;  Laterality: N/A;  . THYROIDECTOMY    . Tumor   05/2010   Left breast under nipple  . 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 Hydrocodone Nausea Only  . Lisinopril     Angio edema  . Naprosyn [Naproxen]     nausea  . Vioxx [Rofecoxib]      Review of Systems     Objective:   Physical Exam  Constitutional: She is oriented to person, place, and time. She appears well-developed and well-nourished. No distress.  HENT:  Head: Normocephalic and atraumatic.  Mouth/Throat: Oropharynx is clear and moist. No oropharyngeal exudate.  Eyes: Pupils are equal, round, and reactive to light. No scleral icterus.  Neck: Normal range of motion. Neck supple.  Cardiovascular: Normal rate, regular rhythm and normal heart sounds.   Pulmonary/Chest: Effort normal and breath sounds normal. No respiratory distress.  Abdominal: Soft. Bowel sounds are normal. She exhibits no distension. There is no tenderness.  Musculoskeletal: She exhibits no edema.  Lymphadenopathy:    She has no cervical adenopathy.  Neurological: She is alert and oriented to person, place, and time.  NO FOCAL DEFICITS  Psychiatric:  SLIGHTLY ANXIOUS MOOD, NL AFFECT  Vitals reviewed.         Assessment & Plan:

## 2017-05-24 NOTE — Progress Notes (Signed)
CC'ED TO PCP 

## 2017-05-24 NOTE — Patient Instructions (Addendum)
YOUR DIFFICULTY SWALLOWING IS DUE TO BONE SPURS IN YOUR CERVICAL SPINE. MAKE SURE TO ASK DR. Benjamine Mola TO REVIEW THE MODIFIED BARIUM SWALLOW FROM 2017.  FOLLOW SOFT MECHANICAL DIET. SEE INFO BELOW.  DRINK PLENTY OF LIQUIDS AFTER SWALLOWING SOLID FOOD.  CRUSH LARGE PILLS IF POSSIBLE.  FOLLOW UP IN 6 MOS.   SOFT MECHANICAL DIET This SOFT MECHANICAL DIET is restricted to:  Foods that are moist, soft-textured, and easy to chew and swallow.   Meats that are ground or are minced no larger than one-quarter inch pieces. Meats are moist with gravy or sauce added.   Foods that do not include bread or bread-like textures except soft pancakes, well-moistened with syrup or sauce.   Textures with some chewing ability required.   Casseroles without rice.   Cooked vegetables that are less than half an inch in size and easily mashed with a fork. No cooked corn, peas, broccoli, cauliflower, cabbage, Brussels sprouts, asparagus, or other fibrous, non-tender or rubbery cooked vegetables.   Canned fruit except for pineapple. Fruit must be cut into pieces no larger than half an inch in size.   Foods that do not include nuts, seeds, coconut, or sticky textures.   FOOD TEXTURES FOR DYSPHAGIA DIET LEVEL 2 -SOFT MECHANICAL DIET (includes all foods on Dysphagia Diet Level 1 - Pureed, in addition to the foods listed below)  FOOD GROUP: Breads. RECOMMENDED: Soft pancakes, well-moistened with syrup or sauce.  AVOID: All others.  FOOD GROUP: Cereals.  RECOMMENDED: Cooked cereals with little texture, including oatmeal. Unprocessed wheat bran stirred into cereals for bulk. Note: If thin liquids are restricted, it is important that all of the liquid is absorbed into the cereal.  AVOID: All dry cereals and any cooked cereals that may contain flax seeds or other seeds or nuts. Whole-grain, dry, or coarse cereals. Cereals with nuts, seeds, dried fruit, and/or coconut.  FOOD GROUP: Desserts. RECOMMENDED: Pudding,  custard. Soft fruit pies with bottom crust only. Canned fruit (excluding pineapple). Soft, moist cakes with icing.Frozen malts, milk shakes, frozen yogurt, eggnog, nutritional supplements, ice cream, sherbet, regular or sugar-free gelatin, or any foods that become thin liquid at either room (70 F) or body temperature (98 F).  AVOID: Dry, coarse cakes and cookies. Anything with nuts, seeds, coconut, pineapple, or dried fruit. Breakfast yogurt with nuts. Rice or bread pudding.  FOOD GROUP: Fats. RECOMMENDED: Butter, margarine, cream for cereal (depending on liquid consistency recommendations), gravy, cream sauces, sour cream, sour cream dips with soft additives, mayonnaise, salad dressings, cream cheese, cream cheese spreads with soft additives, whipped toppings.  AVOID: All fats with coarse or chunky additives.  FOOD GROUP: Fruits. RECOMMENDED: Soft drained, canned, or cooked fruits without seeds or skin. Fresh soft and ripe banana. Fruit juices with a small amount of pulp. If thin liquids are restricted, fruit juices should be thickened to appropriate consistency.  AVOID: Fresh or frozen fruits. Cooked fruit with skin or seeds. Dried fruits. Fresh, canned, or cooked pineapple.  FOOD GROUP: Meats and Meat Substitutes. (Meat pieces should not exceed 1/4 of an inch cube and should be tender.) RECOMMENDED: Moistened ground or cooked meat, poultry, or fish. Moist ground or tender meat may be served with gravy or sauce. Casseroles without rice. Moist macaroni and cheese, well-cooked pasta with meat sauce, tuna noodle casserole, soft, moist lasagna. Moist meatballs, meatloaf, or fish loaf. Protein salads, such as tuna or egg without large chunks, celery, or onion. Cottage cheese, smooth quiche without large chunks. Poached, scrambled, or soft-cooked  eggs (egg yolks should not be "runny" but should be moist and able to be mashed with butter, margarine, or other moisture added to them). (Cook eggs to 160  F or use pasteurized eggs for safety.) Souffls may have small, soft chunks. Tofu. Well-cooked, slightly mashed, moist legumes, such as baked beans. All meats or protein substitutes should be served with sauces or moistened to help maintain cohesiveness in the oral cavity.  AVOID: Dry meats, tough meats (such as bacon, sausage, hot dogs, bratwurst). Dry casseroles or casseroles with rice or large chunks. Peanut butter. Cheese slices and cubes. Hard-cooked or crisp fried eggs. Sandwiches.Pizza.  FOOD GROUP: Potatoes and Starches. RECOMMENDED: Well-cooked, moistened, boiled, baked, or mashed potatoes. Well-cooked shredded hash brown potatoes that are not crisp. (All potatoes need to be moist and in sauces.)Well-cooked noodles in sauce. Spaetzel or soft dumplings that have been moistened with butter or gravy.  AVOID: Potato skins and chips. Fried or French-fried potatoes. Rice.  FOOD GROUP: Soups. RECOMMENDED: Soups with easy-to-chew or easy-to-swallow meats or vegetables: Particle sizes in soups should be less than 1/2 inch. Soups will need to be thickened to appropriate consistency if soup is thinner than prescribed liquid consistency.  AVOID: Soups with large chunks of meat and vegetables. Soups with rice, corn, peas.  FOOD GROUP: Vegetables. RECOMMENDED: All soft, well-cooked vegetables. Vegetables should be less than a half inch. Should be easily mashed with a fork.  AVOID: Cooked corn and peas. Broccoli, cabbage, Brussels sprouts, asparagus, or other fibrous, non-tender or rubbery cooked vegetables.  FOOD GROUP: Miscellaneous. RECOMMENDED: Jams and preserves without seeds, jelly. Sauces, salsas, etc., that may have small tender chunks less than 1/2 inch. Soft, smooth chocolate bars that are easily chewed.  AVOID: Seeds, nuts, coconut, or sticky foods. Chewy candies such as caramels or licorice.

## 2017-06-04 ENCOUNTER — Other Ambulatory Visit: Payer: Self-pay | Admitting: Family Medicine

## 2017-06-11 ENCOUNTER — Ambulatory Visit (INDEPENDENT_AMBULATORY_CARE_PROVIDER_SITE_OTHER): Payer: Medicare Other | Admitting: Otolaryngology

## 2017-06-11 DIAGNOSIS — R490 Dysphonia: Secondary | ICD-10-CM

## 2017-06-11 DIAGNOSIS — K219 Gastro-esophageal reflux disease without esophagitis: Secondary | ICD-10-CM | POA: Diagnosis not present

## 2017-06-19 ENCOUNTER — Ambulatory Visit (INDEPENDENT_AMBULATORY_CARE_PROVIDER_SITE_OTHER): Payer: Medicare Other

## 2017-06-19 DIAGNOSIS — Z23 Encounter for immunization: Secondary | ICD-10-CM | POA: Diagnosis not present

## 2017-06-21 ENCOUNTER — Ambulatory Visit (HOSPITAL_BASED_OUTPATIENT_CLINIC_OR_DEPARTMENT_OTHER): Admit: 2017-06-21 | Payer: Medicare Other | Admitting: Obstetrics and Gynecology

## 2017-06-21 ENCOUNTER — Encounter (HOSPITAL_BASED_OUTPATIENT_CLINIC_OR_DEPARTMENT_OTHER): Payer: Self-pay

## 2017-06-21 ENCOUNTER — Other Ambulatory Visit: Payer: Self-pay | Admitting: Family Medicine

## 2017-06-21 SURGERY — ANTERIOR (CYSTOCELE) AND POSTERIOR REPAIR (RECTOCELE)
Anesthesia: Choice

## 2017-06-25 ENCOUNTER — Other Ambulatory Visit: Payer: Self-pay | Admitting: Family Medicine

## 2017-06-25 NOTE — Telephone Encounter (Signed)
May have this +4 additional refills 

## 2017-07-02 ENCOUNTER — Telehealth: Payer: Self-pay | Admitting: Family Medicine

## 2017-07-02 DIAGNOSIS — M25559 Pain in unspecified hip: Secondary | ICD-10-CM

## 2017-07-02 NOTE — Telephone Encounter (Signed)
Pt calling to state that she never heard from ortho referral back in April  States her right hip pain is worse and she would like to be referred to Dr. Onnie Graham  Previous referral was for back pain, please advise    Please initiate new referral so I may process

## 2017-07-02 NOTE — Telephone Encounter (Signed)
She may have referral for Dr. supple hip pain

## 2017-07-03 NOTE — Telephone Encounter (Signed)
Referral ordered in EPIC. 

## 2017-07-05 ENCOUNTER — Encounter: Payer: Self-pay | Admitting: Family Medicine

## 2017-07-17 DIAGNOSIS — M25551 Pain in right hip: Secondary | ICD-10-CM | POA: Diagnosis not present

## 2017-07-18 ENCOUNTER — Other Ambulatory Visit: Payer: Self-pay | Admitting: Family Medicine

## 2017-07-25 DIAGNOSIS — M25551 Pain in right hip: Secondary | ICD-10-CM | POA: Diagnosis not present

## 2017-07-30 ENCOUNTER — Ambulatory Visit (INDEPENDENT_AMBULATORY_CARE_PROVIDER_SITE_OTHER): Payer: Medicare Other | Admitting: Otolaryngology

## 2017-07-30 DIAGNOSIS — K219 Gastro-esophageal reflux disease without esophagitis: Secondary | ICD-10-CM

## 2017-07-30 DIAGNOSIS — R490 Dysphonia: Secondary | ICD-10-CM | POA: Diagnosis not present

## 2017-07-31 DIAGNOSIS — M1611 Unilateral primary osteoarthritis, right hip: Secondary | ICD-10-CM | POA: Diagnosis not present

## 2017-08-01 DIAGNOSIS — M1611 Unilateral primary osteoarthritis, right hip: Secondary | ICD-10-CM | POA: Diagnosis not present

## 2017-08-01 DIAGNOSIS — M25551 Pain in right hip: Secondary | ICD-10-CM | POA: Diagnosis not present

## 2017-08-08 ENCOUNTER — Encounter: Payer: Self-pay | Admitting: Obstetrics & Gynecology

## 2017-08-08 NOTE — Progress Notes (Signed)
Chief Complaint  Patient presents with  . Follow-up      76 y.o. [id number] No LMP recorded. Patient has had a hysterectomy. The current method of family planning is status post hysterectomy.  Outpatient Encounter Medications as of 04/02/2017  Medication Sig  . Biotin (BIOTIN MAXIMUM STRENGTH) 10 MG TABS Take 1 tablet by mouth daily.   . Calcium Carbonate-Vitamin D (CALTRATE 600+D PO) Take 1 tablet by mouth daily.   Marland Kitchen conjugated estrogens (PREMARIN) vaginal cream Use every other night, 1 gram (Patient not taking: Reported on 05/03/2017)  . dicyclomine (BENTYL) 20 MG tablet Take one every 6-8 hours as needed for abdominal cramps (Patient not taking: Reported on 05/24/2017)  . etodolac (LODINE) 400 MG tablet Take 1 tablet (400 mg total) by mouth 2 (two) times daily.  . ondansetron (ZOFRAN ODT) 4 MG disintegrating tablet 4mg  ODT q4 hours prn nausea/vomit (Patient not taking: Reported on 05/03/2017)  . OVER THE COUNTER MEDICATION Take 1 tablet by mouth daily. Focus factor  . oxybutynin (DITROPAN) 5 MG tablet TAKE 1 TABLET(5 MG) BY MOUTH THREE TIMES DAILY (Patient not taking: Reported on 05/03/2017)  . Probiotic Product (PROBIOTIC DAILY PO) Take 1 tablet by mouth daily.   . temazepam (RESTORIL) 30 MG capsule TAKE 1 CAPSULE BY MOUTH EVERY DAY AT BEDTIME  . [DISCONTINUED] CARTIA XT 300 MG 24 hr capsule TAKE 1 CAPSULE BY MOUTH EVERY DAY  . [DISCONTINUED] diclofenac (VOLTAREN) 75 MG EC tablet Take 1 tablet by mouth twice daily with food.  . [DISCONTINUED] indapamide (LOZOL) 2.5 MG tablet TAKE 1 TABLET(2.5 MG) BY MOUTH DAILY  . [DISCONTINUED] potassium chloride (K-DUR) 10 MEQ tablet TAKE 1 TABLET(10 MEQ) BY MOUTH TWICE DAILY  . [DISCONTINUED] pravastatin (PRAVACHOL) 80 MG tablet TAKE 1 TABLET(80 MG) BY MOUTH DAILY   No facility-administered encounter medications on file as of 04/02/2017.     Subjective Melissa Barajas is seen for follow up from last appointment She was placed on premarin vaginal  cram and ditropan for atrophic changes and vaginal prolapse and urge incontinence respectively She states she is improved but having symptoms still which I would expect No pain just pressure, urinary frequency is better Past Medical History:  Diagnosis Date  . Arthritis   . Atrial fibrillation ([place])   . Collagen vascular disease ([place])   . DIVERTICULOSIS OF COLON 08/25/2010   Qualifier: Diagnosis of  By: Hoy Morn    . GERD (gastroesophageal reflux disease)   . HEMORRHOIDS, INTERNAL 08/25/2010   Qualifier: Diagnosis of  By: Hoy Morn    . Hypercholesteremia   . Hypertension   . Lipoma of arm    Left    Past Surgical History:  Procedure Laterality Date  . ABDOMINAL HYSTERECTOMY     Partial  . COLONOSCOPY  2009   SLF: 1. screening colonoscopy in 10 years 2. She should follow a high fiber diet She has been given a hand out on high fiber diverticulosois and hemorrhoids  . CYSTECTOMY     Left breast  . ESOPHAGOGASTRODUODENOSCOPY     [id number] appearing Schatzki's ring, possible cervical esophageal web, both dilated with passage of a Maloney dilator/small hiatal hernia  . ESOPHAGOGASTRODUODENOSCOPY N/A 10/25/2015   Procedure: ESOPHAGOGASTRODUODENOSCOPY (EGD);  Surgeon: Danie Binder, MD;  Location: [facility];  Service: Endoscopy;  Laterality: N/A;  1215pm  . SAVORY DILATION N/A 10/25/2015   Procedure: SAVORY DILATION;  Surgeon: Danie Binder, MD;  Location: [facility];  Service: Endoscopy;  Laterality: N/A;  . THYROIDECTOMY    . Tumor   05/2010   Left breast under nipple  . 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 Hydrocodone Nausea Only  . Lisinopril     Angio edema  . Naprosyn [Naproxen]     nausea  . Vioxx [Rofecoxib]     Social History   Socioeconomic History  .  Marital status: Married    Spouse name: None  . Number of children: None  . Years of education: None  . Highest education level: None  Social Needs  . Financial resource strain: None  . Food insecurity - worry: None  . Food insecurity - inability: None  . Transportation needs - medical: None  . Transportation needs - non-medical: None  Occupational History  . None  Tobacco Use  . Smoking status: Former Smoker    Start date: 09/12/1983    Last attempt to quit: 09/11/1993    Years since quitting: 23.9  . Smokeless tobacco: Never Used  Substance and Sexual Activity  . Alcohol use: No    Alcohol/week: 0.0 oz  . Drug use: No  . Sexual activity: None  Other Topics Concern  . None  Social History Narrative  . None    Family History  Problem Relation Age of Onset  . Cancer Brother        Prostate    Medications:       Current Outpatient Medications:  .  Biotin (BIOTIN MAXIMUM STRENGTH) 10 MG TABS, Take 1 tablet by mouth daily. , Disp: , Rfl:  .  Calcium Carbonate-Vitamin D (CALTRATE 600+D PO), Take 1 tablet by mouth daily. , Disp: , Rfl:  .  conjugated estrogens (PREMARIN) vaginal cream, Use every other night, 1 gram (Patient not taking: Reported on 05/03/2017), Disp: 30 g, Rfl: 12 .  dicyclomine (BENTYL) 20 MG tablet, Take one every 6-8 hours as needed for abdominal cramps (Patient not taking: Reported on 05/24/2017), Disp: 15 tablet, Rfl: 0 .  etodolac (LODINE) 400 MG tablet, Take 1 tablet (400 mg total) by mouth 2 (two) times daily., Disp: 180 tablet, Rfl: 1 .  ondansetron (ZOFRAN ODT) 4 MG disintegrating tablet, 4mg  ODT q4 hours prn nausea/vomit (Patient not taking: Reported on 05/03/2017), Disp: 12 tablet, Rfl: 0 .  OVER THE COUNTER MEDICATION, Take 1 tablet by mouth daily. Focus factor, Disp: , Rfl:  .  oxybutynin (DITROPAN) 5 MG tablet, TAKE 1 TABLET(5 MG) BY MOUTH THREE TIMES DAILY (Patient not taking: Reported on 05/03/2017), Disp: 270 tablet, Rfl: 1 .  Probiotic Product  (PROBIOTIC DAILY PO), Take 1 tablet by mouth daily. , Disp: , Rfl:  .  temazepam (RESTORIL) 30 MG capsule, TAKE 1 CAPSULE BY MOUTH EVERY DAY AT BEDTIME, Disp: 90 capsule, Rfl: 3 .  CARTIA XT 300 MG 24 hr capsule, TAKE 1 CAPSULE BY MOUTH EVERY DAY, Disp: 30 capsule, Rfl: 0 .  etodolac (LODINE) 400 MG tablet, TAKE 1 TABLET BY MOUTH TWICE DAILY, Disp: 180 tablet, Rfl: 4 .  indapamide (LOZOL) 2.5 MG tablet, TAKE 1 TABLET(2.5 MG) BY MOUTH DAILY, Disp: 90 tablet, Rfl: 1 .  potassium chloride (K-DUR)  10 MEQ tablet, TAKE 1 TABLET(10 MEQ) BY MOUTH TWICE DAILY, Disp: 180 tablet, Rfl: 1 .  pravastatin (PRAVACHOL) 80 MG tablet, TAKE 1 TABLET(80 MG) BY MOUTH DAILY, Disp: 90 tablet, Rfl: 0  Objective Blood pressure (!) 148/90, pulse 72, weight 181 lb (82.1 kg).  Gen WDWN female NAD  Pertinent ROS No burning with urination, frequency or urgency No nausea, vomiting or diarrhea Nor fever chills or other constitutional symptoms  Labs or studies No new    Impression Diagnoses this Encounter::   ICD-10-CM   1. 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 Urge incontinence N39.41     Established relevant diagnosis(es):   Plan/Recommendations: No orders of the defined types were placed in this encounter.   Labs or Scans Ordered: No orders of the defined types were placed in this encounter.   Management:: Continue vaginal premarin and TID ditropan 5 mg  Follow up Return if symptoms worsen or fail to improve.          All questions were answered.

## 2017-08-17 DIAGNOSIS — M1611 Unilateral primary osteoarthritis, right hip: Secondary | ICD-10-CM | POA: Diagnosis not present

## 2017-08-22 ENCOUNTER — Ambulatory Visit (HOSPITAL_COMMUNITY): Payer: Medicare Other | Attending: Orthopedic Surgery | Admitting: Physical Therapy

## 2017-08-22 ENCOUNTER — Encounter (HOSPITAL_COMMUNITY): Payer: Self-pay | Admitting: Physical Therapy

## 2017-08-22 DIAGNOSIS — R296 Repeated falls: Secondary | ICD-10-CM | POA: Insufficient documentation

## 2017-08-22 DIAGNOSIS — M25551 Pain in right hip: Secondary | ICD-10-CM | POA: Insufficient documentation

## 2017-08-22 DIAGNOSIS — M6281 Muscle weakness (generalized): Secondary | ICD-10-CM | POA: Insufficient documentation

## 2017-08-22 NOTE — Patient Instructions (Addendum)
Toe Raise (Sitting)    Raise toes, keeping heels on floor.NOw raise your heels  Repeat 10____ times per set. Do __1__ sets per session. Do __3__ sessions per day.  http://orth.exer.us/46   Copyright  VHI. All rights reserved.  Knee Extension (Sitting)    Place _0___ pound weight on left ankle and straighten knee fully, lower slowly. Repeat __10__ times per set. Do __1__ sets per session. Do ___3_ sessions per day.  http://orth.exer.us/732   Copyright  VHI. All rights reserved.  Functional Quadriceps: Sit to Stand    Sit on edge of chair, feet flat on floor. Stand upright, extending knees fully. Repeat __5-10__ times per set. Do ____ sets per session. Do __3__ sessions per day.  http://orth.exer.us/734   Copyright  VHI. All rights reserved.  Strengthening: Hip Abduction (Side-Lying)    Tighten muscles on front of left thigh, then lift leg _12___ inches from surface, keeping knee locked.  Repeat __10__ times per set. Do __1__ sets per session. Do ___2_ sessions per day.  http://orth.exer.us/622   Copyright  VHI. All rights reserved.  Strengthening: Straight Leg Raise (Phase 1)    Tighten muscles on front of right thigh, then lift leg __12__ inches from surface, keeping knee locked.  Repeat _10___ times per set. Do _1___ sets per session. Do __2__ sessions per day.  http://orth.exer.us/614   Copyright  VHI. All rights reserved.  Bridging    Slowly raise buttocks from floor, keeping stomach tight. Hold 10 seconds  Repeat _10___ times per set. Do ___1_ sets per session. Do __3__ sessions per day.  http://orth.exer.us/1096   Copyright  VHI. All rights reserved.

## 2017-08-22 NOTE — Therapy (Signed)
[place], [place], [address] Phone: [phone]   Fax:  [phone]  Physical Therapy Evaluation  Patient Details  Name: Melissa Barajas MRN: [id number] Date of Birth: 1941-05-15 Referring Provider: Rod Can    Encounter Date: 08/22/2017  PT End of Session - 08/22/17 1127    Visit Number  1    Number of Visits  12    Date for PT Re-Evaluation  09/21/17    Authorization Type  Medicare    Authorization - Visit Number  1    Authorization - Number of Visits  10    PT Start Time  2703    PT Stop Time  1120    PT Time Calculation (min)  45 min    Activity Tolerance  Patient tolerated treatment well       Past Medical History:  Diagnosis Date  . Arthritis   . Atrial fibrillation ([place])   . Collagen vascular disease ([place])   . DIVERTICULOSIS OF COLON 08/25/2010   Qualifier: Diagnosis of  By: Hoy Morn    . GERD (gastroesophageal reflux disease)   . HEMORRHOIDS, INTERNAL 08/25/2010   Qualifier: Diagnosis of  By: Hoy Morn    . Hypercholesteremia   . Hypertension   . Lipoma of arm    Left    Past Surgical History:  Procedure Laterality Date  . ABDOMINAL HYSTERECTOMY     Partial  . COLONOSCOPY  2009   SLF: 1. screening colonoscopy in 10 years 2. She should follow a high fiber diet She has been given a hand out on high fiber diverticulosois and hemorrhoids  . CYSTECTOMY     Left breast  . ESOPHAGOGASTRODUODENOSCOPY     [id number] appearing Schatzki's ring, possible cervical esophageal web, both dilated with passage of a Maloney dilator/small hiatal hernia  . ESOPHAGOGASTRODUODENOSCOPY N/A 10/25/2015   Procedure: ESOPHAGOGASTRODUODENOSCOPY (EGD);  Surgeon: Danie Binder, MD;  [facility];  Service: Endoscopy;  Laterality: N/A;  1215pm  . SAVORY DILATION N/A 10/25/2015   Procedure: SAVORY DILATION;  Surgeon: Danie Binder, MD;  [facility];  Service: Endoscopy;   Laterality: N/A;  . THYROIDECTOMY    . Tumor   05/2010   Left breast under nipple  . 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   Subjective Assessment - 08/22/17 1039    Subjective  Ms. Casco states that she woke up on 09/05/2016 with excruciating pain going from her right hip to her knee.  Just recently she was referred to have a cortisone shot which helped a little but she is still having bouts of pain that is so bad that she is waking up 2-3 times a night.      Pertinent History  RA     Limitations  Sitting;Lifting;Standing;Walking    How long can you sit comfortably?  15 minutes     How long can you stand comfortably?  less than five minutes     How long can you walk comfortably?  able to walk 5-10 minutes.      Patient Stated Goals  less pain, to be able to stand and walk longer, to have better activity tolerance     Currently in Pain?  Yes    Pain Score  2  worst is a 10/10 at night     Pain Location  Hip  Pain Orientation  Right    Pain Descriptors / Indicators  Aching;Tingling    Pain Type  Chronic pain    Pain Radiating Towards  knee     Pain Onset  More than a month ago    Pain Frequency  Intermittent    Aggravating Factors   standing or walking     Pain Relieving Factors  has not tried anything besides the cortisone shot 50% relief.  Pt was walking with a cane prior to the shot.    Effect of Pain on Daily Activities  increases          [facility] PT Assessment - 08/22/17 0001      Assessment   Medical Diagnosis  Rt trochanteric bursitis    Referring Provider  Rod Can     Onset Date/Surgical Date  09/05/16    Next MD Visit  not scheduled     Prior Therapy  none      Precautions   Precautions  None      Restrictions   Weight Bearing Restrictions  No      Balance Screen   Has the patient fallen in the past 6 months  Yes    How many times?  multiple    Has the patient had a decrease in activity  level because of a fear of falling?   Yes    Is the patient reluctant to leave their home because of a fear of falling?   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       Sit to Stand   Comments  5x 25.95      ROM / Strength   AROM / PROM / Strength  Strength      Strength   Strength Assessment Site  Hip;Knee;Ankle    Right/Left Hip  Right;Left    Right Hip Flexion  2+/5    Right Hip Extension  2/5    Right Hip ABduction  2+/5    Left Hip Flexion  3+/5    Left Hip Extension  3/5    Left Hip ABduction  3-/5    Right/Left Knee  Right;Left    Right Knee Flexion  3+/5    Right Knee Extension  3/5    Left Knee Flexion  4-/5    Left Knee Extension  3/5    Right/Left Ankle  Right;Left    Right Ankle Dorsiflexion  3+/5    Right Ankle Plantar Flexion  3+/5    Left Ankle Dorsiflexion  3+/5    Left Ankle Plantar Flexion  3+/5  Objective measurements completed on examination: See above findings.      [facility] Adult PT Treatment/Exercise - 08/22/17 0001      Exercises   Exercises  Knee/Hip      Knee/Hip Exercises: Seated   Long Arc Quad  Both;5 reps    Other Seated Knee/Hip Exercises  Ankle DF/PF x 5     Sit to [employer]  5 reps      Knee/Hip Exercises: Supine   Bridges  5 reps    Straight Leg Raises  Both;5  reps      Knee/Hip Exercises: Sidelying   Hip ABduction  Both;5 reps             PT Education - 08/22/17 1125    Education provided  Yes    Education Details  HEP     Person(s) Educated  Patient    Methods  Explanation;Handout    Comprehension  Returned demonstration       PT Short Term Goals - 08/22/17 1136      PT SHORT TERM GOAL #1   Title  Pt Rt hip pain to be no greater than a 5/10 to allow pt to walk for ten minutes.    Time  2    Period  Weeks    Status  New    Target Date  09/05/17      PT SHORT TERM GOAL #2   Title  PT to only be waking up 1 time a week with right hip pain     Time  2    Period  Weeks    Status  New      PT SHORT TERM GOAL #3   Title  PT to be able to sit for 30 minutes in comfort to be able to eat a meal without pain.     Time  3    Period  Weeks    Status  New        PT Long Term Goals - 08/22/17 1138      PT LONG TERM GOAL #1   Title  Pt to be able to walk for 30 minutes with no assistive device and no pain to be able to feel confident returning to [facility] [employer]    Time  4    Period  Weeks    Status  New    Target Date  09/19/17      PT LONG TERM GOAL #2   Title  Pt to be able to sit for an hour without increased right hip pain to enjoy going out to eat.     Time  4    Period  Weeks    Status  New      PT LONG TERM GOAL #3   Title  Pt rt hip pain to be no greater than a 2/10 to allow pt to sleep all night without walking from rt hip pain.     Time  4    Period  Weeks      PT LONG TERM GOAL #4   Title  Pt to be able to single leg stance for 10 seconds on both LE to reduce risk of falls.     Time  4    Period  Weeks    Status  New             Plan - 08/22/17 1129    Clinical Impression Statement  Ms. Cubbage is a 76 yo female who has had intermittent  Rt hip pain for almost a year.  The patient was falling on a frequent basis but has not fallen recently.   Do to the  pain increasing Ms. Bible elected to start walking  with a cane.  She has recently been to the  MD who referred her both to physical therapy and to get an injection.  She had the injection a month ago and it has improved her functional abilty but only 50% therefore she is now trying physcial therapy.  Her evaluation demonstrates decreased strength, decreased balance, decreased activity tolerance and  tight mm.  Ms. Donoso will benefit from skilled physical therapy to maximize her functional ability.      History and Personal Factors relevant to plan of care:  B TKR, RA, OA, back pain,     Clinical Presentation  Stable    Clinical Decision Making  Low    Rehab Potential  Good    PT Frequency  3x / week    PT Duration  4 weeks    PT Treatment/Interventions  ADLs/Self Care Home Management;Therapeutic activities;Functional mobility training;Gait training;Stair training;Therapeutic exercise;Balance training;Neuromuscular re-education;Patient/family education;Manual techniques;Dry needling    PT Next Visit Plan  Begin rockerboard, sidestepping with t-band, functional squat, lunges, tandem stance and IT band stretching.  Progress pt in functional strengthening and balance as able.     PT Home Exercise Plan  Sitting:  sit to stand, LAQ, Ankle DF/PF, Supine:  SLR, bridging;  SL: hip abduction.        Patient will benefit from skilled therapeutic intervention in order to improve the following deficits and impairments:  Abnormal gait, Decreased activity tolerance, Decreased balance, Decreased strength, Difficulty walking, Pain, Impaired flexibility  Visit Diagnosis: Pain in right hip - Plan: PT plan of care cert/re-cert  Repeated falls - Plan: PT plan of care cert/re-cert  Muscle weakness (generalized) - Plan: PT plan of care cert/re-cert  G-Codes - 97/41/63 1146    Functional Limitation  Mobility: Walking and moving around    Mobility: Walking and Moving Around Current Status [phone])  At least 40 percent but less than 60 percent impaired, limited or  restricted    Mobility: Walking and Moving Around Goal Status [phone])  At least 20 percent but less than 40 percent impaired, limited or restricted        Problem List Patient Active Problem List   Diagnosis Date Noted  . Dysphagia, pharyngeal 05/24/2017  . Hyperglycemia 10/12/2014  . Ganglion cyst 09/26/2013  . 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 Urge incontinence 03/25/2013  . Functional constipation 11/20/2012  . Non-ulcer dyspepsia 08/30/2010  . Hyperlipidemia 08/25/2010  . Essential hypertension 08/25/2010  . GERD 08/25/2010    Rayetta Humphrey, PT CLT [phone] 08/22/2017, 11:49 AM  [facility] [address] [place], [place], [address] Phone: [phone]   Fax:  [phone]  Name: Melissa Barajas MRN: [id number] Date of Birth: 14-Nov-1940

## 2017-08-23 ENCOUNTER — Ambulatory Visit (HOSPITAL_COMMUNITY): Payer: Medicare Other | Admitting: Physical Therapy

## 2017-08-23 DIAGNOSIS — M6281 Muscle weakness (generalized): Secondary | ICD-10-CM | POA: Diagnosis not present

## 2017-08-23 DIAGNOSIS — R296 Repeated falls: Secondary | ICD-10-CM

## 2017-08-23 DIAGNOSIS — M25551 Pain in right hip: Secondary | ICD-10-CM

## 2017-08-23 NOTE — Therapy (Signed)
[place], [place], [address] Phone: [phone]   Fax:  [phone]  Physical Therapy Treatment  Patient Details  Name: Melissa Barajas MRN: [id number] Date of Birth: 1941/03/11 Referring Provider: Rod Can    Encounter Date: 08/23/2017  PT End of Session - 08/23/17 1120    Visit Number  2    Number of Visits  12    Date for PT Re-Evaluation  09/21/17    Authorization Type  Medicare    Authorization - Visit Number  2    Authorization - Number of Visits  10    PT Start Time  1030    PT Stop Time  1119    PT Time Calculation (min)  49 min    Activity Tolerance  Patient tolerated treatment well       Past Medical History:  Diagnosis Date  . Arthritis   . Atrial fibrillation ([place])   . Collagen vascular disease ([place])   . DIVERTICULOSIS OF COLON 08/25/2010   Qualifier: Diagnosis of  By: Hoy Morn    . GERD (gastroesophageal reflux disease)   . HEMORRHOIDS, INTERNAL 08/25/2010   Qualifier: Diagnosis of  By: Hoy Morn    . Hypercholesteremia   . Hypertension   . Lipoma of arm    Left    Past Surgical History:  Procedure Laterality Date  . ABDOMINAL HYSTERECTOMY     Partial  . COLONOSCOPY  2009   SLF: 1. screening colonoscopy in 10 years 2. She should follow a high fiber diet She has been given a hand out on high fiber diverticulosois and hemorrhoids  . CYSTECTOMY     Left breast  . ESOPHAGOGASTRODUODENOSCOPY     [id number] appearing Schatzki's ring, possible cervical esophageal web, both dilated with passage of a Maloney dilator/small hiatal hernia  . ESOPHAGOGASTRODUODENOSCOPY N/A 10/25/2015   Procedure: ESOPHAGOGASTRODUODENOSCOPY (EGD);  Surgeon: Danie Binder, MD;  [facility];  Service: Endoscopy;  Laterality: N/A;  1215pm  . SAVORY DILATION N/A 10/25/2015   Procedure: SAVORY DILATION;  Surgeon: Danie Binder, MD;  [facility];  Service: Endoscopy;   Laterality: N/A;  . THYROIDECTOMY    . Tumor   05/2010   Left breast under nipple  . 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  Subjective Assessment - 08/23/17 1040    Subjective  Pt states she really has no pain today. Reports overall compliance with HEP, however unable to get them done yesterday due to some conflict.     Currently in Pain?  No/denies                      [facility] Adult PT Treatment/Exercise - 08/23/17 0001      Knee/Hip Exercises: Standing   Heel Raises  Both;10 reps;Limitations    Heel Raises Limitations  toeraises 10 reps    Forward Lunges  Both;10 reps;Limitations    Forward Lunges Limitations  onto 4" step without UE assist    Rocker Board  2 minutes;Limitations    Rocker Board Limitations  Rt/Lt and A/P    Other Standing Knee Exercises  tandem stance 30" each with max of 5" without UE assist      Knee/Hip Exercises: Seated   Long Arc Quad  Both;10 reps;Limitations    Long Arc Quad Limitations  3 second holds  Other Seated Knee/Hip Exercises  Ankle DF/PF x 10     Sit to Sand  5 reps;without UE support      Knee/Hip Exercises: Supine   Bridges  10 reps    Straight Leg Raises  Both;10 reps      Knee/Hip Exercises: Sidelying   Hip ABduction  Both;10 reps    Clams  both 10 reps 3" holds             PT Education - 08/23/17 1050    Education provided  Yes    Education Details  reviewed HEP and goals per initial evaluation.  Given copy of evaluation for home.    Person(s) Educated  Patient    Methods  Explanation;Demonstration;Tactile cues;Verbal cues;Handout    Comprehension  Verbalized understanding;Returned demonstration;Verbal cues required;Tactile cues required;Need further instruction       PT Short Term Goals - 08/22/17 1136      PT SHORT TERM GOAL #1   Title  Pt Rt hip pain to be no greater than a 5/10 to allow pt to walk for ten minutes.    Time  2    Period   Weeks    Status  New    Target Date  09/05/17      PT SHORT TERM GOAL #2   Title  PT to only be waking up 1 time a week with right hip pain     Time  2    Period  Weeks    Status  New      PT SHORT TERM GOAL #3   Title  PT to be able to sit for 30 minutes in comfort to be able to eat a meal without pain.     Time  3    Period  Weeks    Status  New        PT Long Term Goals - 08/22/17 1138      PT LONG TERM GOAL #1   Title  Pt to be able to walk for 30 minutes with no assistive device and no pain to be able to feel confident returning to the [employer]    Time  4    Period  Weeks    Status  New    Target Date  09/19/17      PT LONG TERM GOAL #2   Title  Pt to be able to sit for an hour without increased right hip pain to enjoy going out to eat.     Time  4    Period  Weeks    Status  New      PT LONG TERM GOAL #3   Title  Pt rt hip pain to be no greater than a 2/10 to allow pt to sleep all night without walking from rt hip pain.     Time  4    Period  Weeks      PT LONG TERM GOAL #4   Title  Pt to be able to single leg stance for 10 seconds on both LE to reduce risk of falls.     Time  4    Period  Weeks    Status  New            Plan - 08/23/17 1120    Clinical Impression Statement  Reviewed HEP and intial evaluation goals with patient.  Pt able to complete all established HEP with minimal cues and correction of form, mainly sidelying abduction and supporting LB  with SLR.  Progressed with standing exercises including rockerboard, tandem stance and lunges.      Rehab Potential  Good    PT Frequency  3x / week    PT Duration  4 weeks    PT Treatment/Interventions  ADLs/Self Care Home Management;Therapeutic activities;Functional mobility training;Gait training;Stair training;Therapeutic exercise;Balance training;Neuromuscular re-education;Patient/family education;Manual techniques;Dry needling    PT Next Visit Plan  Progress with sidestepping with t-band, functional  squat and IT band stretching next session.  Progress pt in functional strengthening and balance as able.     PT Home Exercise Plan  Sitting:  sit to stand, LAQ, Ankle DF/PF, Supine:  SLR, bridging;  SL: hip abduction.        Patient will benefit from skilled therapeutic intervention in order to improve the following deficits and impairments:  Abnormal gait, Decreased activity tolerance, Decreased balance, Decreased strength, Difficulty walking, Pain, Impaired flexibility  Visit Diagnosis: Pain in right hip  Repeated falls  Muscle weakness (generalized)   G-Codes - September 05, 2017 1146    Functional Limitation  Mobility: Walking and moving around    Mobility: Walking and Moving Around Current Status [phone])  At least 40 percent but less than 60 percent impaired, limited or restricted    Mobility: Walking and Moving Around Goal Status [phone])  At least 20 percent but less than 40 percent impaired, limited or restricted       Problem List Patient Active Problem List   Diagnosis Date Noted  . Dysphagia, pharyngeal 05/24/2017  . Hyperglycemia 10/12/2014  . Ganglion cyst 09/26/2013  . 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 Urge incontinence 03/25/2013  . Functional constipation 11/20/2012  . Non-ulcer dyspepsia 08/30/2010  . Hyperlipidemia 08/25/2010  . Essential hypertension 08/25/2010  . GERD 08/25/2010   Teena Irani, PTA/CLT [phone]  Teena Irani 08/23/2017, 11:24 AM  [place], [place], [address] Phone: [phone]   Fax:  [phone]  Name: Melissa Barajas MRN: [id number] Date of Birth: 06/11/1941

## 2017-08-27 ENCOUNTER — Ambulatory Visit (HOSPITAL_COMMUNITY): Payer: Medicare Other | Admitting: Physical Therapy

## 2017-08-27 DIAGNOSIS — M6281 Muscle weakness (generalized): Secondary | ICD-10-CM | POA: Diagnosis not present

## 2017-08-27 DIAGNOSIS — M25551 Pain in right hip: Secondary | ICD-10-CM | POA: Diagnosis not present

## 2017-08-27 DIAGNOSIS — R296 Repeated falls: Secondary | ICD-10-CM | POA: Diagnosis not present

## 2017-08-27 NOTE — Therapy (Signed)
[place], [place], [address] Phone: [phone]   Fax:  [phone]  Physical Therapy Treatment  Patient Details  Name: Melissa Barajas MRN: [id number] Date of Birth: 07/28/1941 Referring Provider: Rod Can    Encounter Date: 08/27/2017  PT End of Session - 08/27/17 1204    Visit Number  3    Number of Visits  12    Date for PT Re-Evaluation  09/21/17    Authorization Type  Medicare    Authorization - Visit Number  3    Authorization - Number of Visits  10    PT Start Time  0903    PT Stop Time  0945    PT Time Calculation (min)  42 min    Activity Tolerance  Patient tolerated treatment well       Past Medical History:  Diagnosis Date  . Arthritis   . Atrial fibrillation ([place])   . Collagen vascular disease ([place])   . DIVERTICULOSIS OF COLON 08/25/2010   Qualifier: Diagnosis of  By: Hoy Morn    . GERD (gastroesophageal reflux disease)   . HEMORRHOIDS, INTERNAL 08/25/2010   Qualifier: Diagnosis of  By: Hoy Morn    . Hypercholesteremia   . Hypertension   . Lipoma of arm    Left    Past Surgical History:  Procedure Laterality Date  . ABDOMINAL HYSTERECTOMY     Partial  . COLONOSCOPY  2009   SLF: 1. screening colonoscopy in 10 years 2. She should follow a high fiber diet She has been given a hand out on high fiber diverticulosois and hemorrhoids  . CYSTECTOMY     Left breast  . ESOPHAGOGASTRODUODENOSCOPY     [id number] appearing Schatzki's ring, possible cervical esophageal web, both dilated with passage of a Maloney dilator/small hiatal hernia  . ESOPHAGOGASTRODUODENOSCOPY N/A 10/25/2015   Procedure: ESOPHAGOGASTRODUODENOSCOPY (EGD);  Surgeon: Danie Binder, MD;  [facility];  Service: Endoscopy;  Laterality: N/A;  1215pm  . SAVORY DILATION N/A 10/25/2015   Procedure: SAVORY DILATION;  Surgeon: Danie Binder, MD;  [facility];  Service: Endoscopy;   Laterality: N/A;  . THYROIDECTOMY    . Tumor   05/2010   Left breast under nipple  . 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  Subjective Assessment - 08/27/17 0915    Subjective  Pt states she is doing well today.  States best weekend in a while and currently without pain.    Currently in Pain?  No/denies                      [facility] Adult PT Treatment/Exercise - 08/27/17 0001      Knee/Hip Exercises: Standing   Heel Raises  Limitations;Both;15 reps    Heel Raises Limitations  toeraises 10 reps    Forward Lunges  Both;Limitations;15 reps    Forward Lunges Limitations  onto 4" step without UE assist    Rocker Board  2 minutes;Limitations    Rocker Board Limitations  Rt/Lt and A/P    Other Standing Knee Exercises  tandem stance 30" each with max of 10" without UE assist      Knee/Hip Exercises: Seated   Sit to Sand  10 reps;without UE support      Knee/Hip Exercises: Supine   Bridges  15 reps    Straight  Leg Raises  Both;10 reps      Knee/Hip Exercises: Sidelying   Hip ABduction  Both;10 reps    Clams  both 10 reps 3" holds               PT Short Term Goals - 08/22/17 1136      PT SHORT TERM GOAL #1   Title  Pt Rt hip pain to be no greater than a 5/10 to allow pt to walk for ten minutes.    Time  2    Period  Weeks    Status  New    Target Date  09/05/17      PT SHORT TERM GOAL #2   Title  PT to only be waking up 1 time a week with right hip pain     Time  2    Period  Weeks    Status  New      PT SHORT TERM GOAL #3   Title  PT to be able to sit for 30 minutes in comfort to be able to eat a meal without pain.     Time  3    Period  Weeks    Status  New        PT Long Term Goals - 08/22/17 1138      PT LONG TERM GOAL #1   Title  Pt to be able to walk for 30 minutes with no assistive device and no pain to be able to feel confident returning to the [employer]    Time  4    Period   Weeks    Status  New    Target Date  09/19/17      PT LONG TERM GOAL #2   Title  Pt to be able to sit for an hour without increased right hip pain to enjoy going out to eat.     Time  4    Period  Weeks    Status  New      PT LONG TERM GOAL #3   Title  Pt rt hip pain to be no greater than a 2/10 to allow pt to sleep all night without walking from rt hip pain.     Time  4    Period  Weeks      PT LONG TERM GOAL #4   Title  Pt to be able to single leg stance for 10 seconds on both LE to reduce risk of falls.     Time  4    Period  Weeks    Status  New            Plan - 08/27/17 1201    Clinical Impression Statement  Continued with focus on improving LE and core strength while avoiding pain.  Pt required less cues with therex this session.  Able to increase repetitions of exercises without difficulty.  Added squats with chair behind and cues due to tendency to shift weight forward onto toes.     Rehab Potential  Good    PT Frequency  3x / week    PT Duration  4 weeks    PT Treatment/Interventions  ADLs/Self Care Home Management;Therapeutic activities;Functional mobility training;Gait training;Stair training;Therapeutic exercise;Balance training;Neuromuscular re-education;Patient/family education;Manual techniques;Dry needling    PT Next Visit Plan  Next session begin sidestepping with t-band and IT band stretching.  Progress pt in functional strengthening and balance as able.     PT Home Exercise Plan  Sitting:  sit  to stand, LAQ, Ankle DF/PF, Supine:  SLR, bridging;  SL: hip abduction.        Patient will benefit from skilled therapeutic intervention in order to improve the following deficits and impairments:  Abnormal gait, Decreased activity tolerance, Decreased balance, Decreased strength, Difficulty walking, Pain, Impaired flexibility  Visit Diagnosis: Pain in right hip  Repeated falls  Muscle weakness (generalized)     Problem List Patient Active Problem List    Diagnosis Date Noted  . Dysphagia, pharyngeal 05/24/2017  . Hyperglycemia 10/12/2014  . Ganglion cyst 09/26/2013  . 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 Urge incontinence 03/25/2013  . Functional constipation 11/20/2012  . Non-ulcer dyspepsia 08/30/2010  . Hyperlipidemia 08/25/2010  . Essential hypertension 08/25/2010  . GERD 08/25/2010   Teena Irani, PTA/CLT [phone]  Teena Irani 08/27/2017, 12:04 PM  [facility] [address] [place], [place], [address] Phone: [phone]   Fax:  [phone]  Name: Melissa Barajas MRN: [id number] Date of Birth: Aug 18, 1941

## 2017-08-29 ENCOUNTER — Encounter (HOSPITAL_COMMUNITY): Payer: Self-pay

## 2017-08-29 ENCOUNTER — Ambulatory Visit (HOSPITAL_COMMUNITY): Payer: Medicare Other

## 2017-08-29 DIAGNOSIS — M6281 Muscle weakness (generalized): Secondary | ICD-10-CM

## 2017-08-29 DIAGNOSIS — R296 Repeated falls: Secondary | ICD-10-CM | POA: Diagnosis not present

## 2017-08-29 DIAGNOSIS — M25551 Pain in right hip: Secondary | ICD-10-CM

## 2017-08-29 NOTE — Therapy (Signed)
[place], [place], [address] Phone: [phone]   Fax:  [phone]  Physical Therapy Treatment  Patient Details  Name: SPARKLES MCNEELY MRN: [id number] Date of Birth: November 17, 1940 Referring Provider: Rod Can    Encounter Date: 08/29/2017  PT End of Session - 08/29/17 0906    Visit Number  4    Number of Visits  12    Date for PT Re-Evaluation  09/21/17    Authorization Type  Medicare    Authorization - Visit Number  4    Authorization - Number of Visits  10    PT Start Time  0900    PT Stop Time  0943    PT Time Calculation (min)  43 min    Activity Tolerance  Patient tolerated treatment well    Behavior During Therapy  [facility] for tasks assessed/performed       Past Medical History:  Diagnosis Date  . Arthritis   . Atrial fibrillation ([place])   . Collagen vascular disease ([place])   . DIVERTICULOSIS OF COLON 08/25/2010   Qualifier: Diagnosis of  By: Hoy Morn    . GERD (gastroesophageal reflux disease)   . HEMORRHOIDS, INTERNAL 08/25/2010   Qualifier: Diagnosis of  By: Hoy Morn    . Hypercholesteremia   . Hypertension   . Lipoma of arm    Left    Past Surgical History:  Procedure Laterality Date  . ABDOMINAL HYSTERECTOMY     Partial  . COLONOSCOPY  2009   SLF: 1. screening colonoscopy in 10 years 2. She should follow a high fiber diet She has been given a hand out on high fiber diverticulosois and hemorrhoids  . CYSTECTOMY     Left breast  . ESOPHAGOGASTRODUODENOSCOPY     [id number] appearing Schatzki's ring, possible cervical esophageal web, both dilated with passage of a Maloney dilator/small hiatal hernia  . ESOPHAGOGASTRODUODENOSCOPY N/A 10/25/2015   Procedure: ESOPHAGOGASTRODUODENOSCOPY (EGD);  Surgeon: Danie Binder, MD;  [facility];  Service: Endoscopy;  Laterality: N/A;  1215pm  . SAVORY DILATION N/A 10/25/2015   Procedure: SAVORY DILATION;  Surgeon: Danie Binder, MD;  [facility];  Service: Endoscopy;  Laterality: N/A;  . THYROIDECTOMY    . Tumor   05/2010   Left breast under nipple  . 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  Subjective Assessment - 08/29/17 0901    Subjective  Pt stated she had a rough night last night.  No reports of pain currently.    Pertinent History  RA     Patient Stated Goals  less pain, to be able to stand and walk longer, to have better activity tolerance     Currently in Pain?  No/denies          [facility] Adult PT Treatment/Exercise - 08/29/17 0001      Knee/Hip Exercises: Stretches   ITB Stretch  2 reps;30 seconds      Knee/Hip Exercises: Standing   Heel Raises  Both;15 reps    Heel Raises Limitations  toeraises 15 reps    Forward Lunges  Both;Limitations;15 reps    Forward Lunges Limitations  onto 4" step without UE assist    Functional Squat  10 reps;Limitations    Functional Squat Limitations  verbal and tactile cueing for form    Rocker Board  2 minutes;Limitations  Rocker Board Limitations  Rt/Lt and A/P    SLS  Lt 3, Rt 4" max of 3    Other Standing Knee Exercises  tandem stance 3x 30" each with intermittent HHA (3rd set able to complete without HHA)    Other Standing Knee Exercises  sidestep with RTB 2RT      Knee/Hip Exercises: Seated   Sit to Sand  10 reps;without UE support      Knee/Hip Exercises: Sidelying   Hip ABduction  Both;15 reps;Limitations    Hip ABduction Limitations  Front of mirror               PT Short Term Goals - 08/22/17 1136      PT SHORT TERM GOAL #1   Title  Pt Rt hip pain to be no greater than a 5/10 to allow pt to walk for ten minutes.    Time  2    Period  Weeks    Status  New    Target Date  09/05/17      PT SHORT TERM GOAL #2   Title  PT to only be waking up 1 time a week with right hip pain     Time  2    Period  Weeks    Status  New      PT SHORT TERM GOAL #3    Title  PT to be able to sit for 30 minutes in comfort to be able to eat a meal without pain.     Time  3    Period  Weeks    Status  New        PT Long Term Goals - 08/22/17 1138      PT LONG TERM GOAL #1   Title  Pt to be able to walk for 30 minutes with no assistive device and no pain to be able to feel confident returning to [facility] [employer]    Time  4    Period  Weeks    Status  New    Target Date  09/19/17      PT LONG TERM GOAL #2   Title  Pt to be able to sit for an hour without increased right hip pain to enjoy going out to eat.     Time  4    Period  Weeks    Status  New      PT LONG TERM GOAL #3   Title  Pt rt hip pain to be no greater than a 2/10 to allow pt to sleep all night without walking from rt hip pain.     Time  4    Period  Weeks      PT LONG TERM GOAL #4   Title  Pt to be able to single leg stance for 10 seconds on both LE to reduce risk of falls.     Time  4    Period  Weeks    Status  New            Plan - 08/29/17 0945    Clinical Impression Statement  Added balance activities and continued with functional strengthening.  Pt improving stability with tandem stance, able to hold with less HHA this session.  Did require some cueing for proper form/technique with squats and proper form with abduction.  No reports of pain through session.    Rehab Potential  Good    PT Frequency  3x / week    PT Duration  4 weeks    PT Treatment/Interventions  ADLs/Self Care Home Management;Therapeutic activities;Functional mobility training;Gait training;Stair training;Therapeutic exercise;Balance training;Neuromuscular re-education;Patient/family education;Manual techniques;Dry needling    PT Next Visit Plan  Begin step up and add foam to tandem stance next session.  Progress pt in functional strengthening and balance as able.     PT Home Exercise Plan  Sitting:  sit to stand, LAQ, Ankle DF/PF, Supine:  SLR, bridging;  SL: hip abduction.        Patient will benefit  from skilled therapeutic intervention in order to improve the following deficits and impairments:  Abnormal gait, Decreased activity tolerance, Decreased balance, Decreased strength, Difficulty walking, Pain, Impaired flexibility  Visit Diagnosis: Pain in right hip  Repeated falls  Muscle weakness (generalized)     Problem List Patient Active Problem List   Diagnosis Date Noted  . Dysphagia, pharyngeal 05/24/2017  . Hyperglycemia 10/12/2014  . Ganglion cyst 09/26/2013  . 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 Urge incontinence 03/25/2013  . Functional constipation 11/20/2012  . Non-ulcer dyspepsia 08/30/2010  . Hyperlipidemia 08/25/2010  . Essential hypertension 08/25/2010  . GERD 08/25/2010   Ihor Austin, [place]; [place]  Aldona Lento 08/29/2017, 9:49 AM  [place], [place], [address] Phone: [phone]   Fax:  [phone]  Name: KASUMI DITULLIO MRN: [id number] Date of Birth: [birth year]

## 2017-08-31 ENCOUNTER — Encounter (HOSPITAL_COMMUNITY): Payer: Self-pay

## 2017-08-31 ENCOUNTER — Ambulatory Visit (HOSPITAL_COMMUNITY): Payer: Medicare Other

## 2017-08-31 DIAGNOSIS — M6281 Muscle weakness (generalized): Secondary | ICD-10-CM | POA: Diagnosis not present

## 2017-08-31 DIAGNOSIS — R296 Repeated falls: Secondary | ICD-10-CM

## 2017-08-31 DIAGNOSIS — M25551 Pain in right hip: Secondary | ICD-10-CM

## 2017-08-31 NOTE — Therapy (Signed)
[place], [place], [address] Phone: [phone]   Fax:  [phone]  Physical Therapy Treatment  Patient Details  Name: Melissa Barajas MRN: [id number] Date of Birth: Aug 12, 1941 Referring Provider: Rod Can    Encounter Date: 08/31/2017  PT End of Session - 08/31/17 0951    Visit Number  5    Number of Visits  12    Date for PT Re-Evaluation  09/21/17    Authorization Type  Medicare    Authorization - Visit Number  5    Authorization - Number of Visits  10    PT Start Time  [phone]    PT Stop Time  1028    PT Time Calculation (min)  40 min    Equipment Utilized During Treatment  Gait belt    Activity Tolerance  Patient tolerated treatment well    Behavior During Therapy  [facility] for tasks assessed/performed       Past Medical History:  Diagnosis Date  . Arthritis   . Atrial fibrillation ([place])   . Collagen vascular disease ([place])   . DIVERTICULOSIS OF COLON 08/25/2010   Qualifier: Diagnosis of  By: Hoy Morn    . GERD (gastroesophageal reflux disease)   . HEMORRHOIDS, INTERNAL 08/25/2010   Qualifier: Diagnosis of  By: Hoy Morn    . Hypercholesteremia   . Hypertension   . Lipoma of arm    Left    Past Surgical History:  Procedure Laterality Date  . ABDOMINAL HYSTERECTOMY     Partial  . COLONOSCOPY  2009   SLF: 1. screening colonoscopy in 10 years 2. She should follow a high fiber diet She has been given a hand out on high fiber diverticulosois and hemorrhoids  . CYSTECTOMY     Left breast  . ESOPHAGOGASTRODUODENOSCOPY     [id number] appearing Schatzki's ring, possible cervical esophageal web, both dilated with passage of a Maloney dilator/small hiatal hernia  . ESOPHAGOGASTRODUODENOSCOPY N/A 10/25/2015   Procedure: ESOPHAGOGASTRODUODENOSCOPY (EGD);  Surgeon: Danie Binder, MD;  [facility];  Service: Endoscopy;  Laterality: N/A;  1215pm  . SAVORY DILATION N/A  10/25/2015   Procedure: SAVORY DILATION;  Surgeon: Danie Binder, MD;  [facility];  Service: Endoscopy;  Laterality: N/A;  . THYROIDECTOMY    . Tumor   05/2010   Left breast under nipple  . 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  Subjective Assessment - 08/31/17 0948    Subjective  Pt stated she is feeling good today, no reports of pain currently and slept good last night.      Patient Stated Goals  less pain, to be able to stand and walk longer, to have better activity tolerance     Currently in Pain?  No/denies                      [facility] Adult PT Treatment/Exercise - 08/31/17 0001      Knee/Hip Exercises: Stretches   ITB Stretch  2 reps;30 seconds      Knee/Hip Exercises: Standing   Heel Raises  20 reps    Heel Raises Limitations  toeraises 20 reps    Forward Lunges  15 reps    Forward Lunges Limitations  floor with intermittent HHA    Functional Squat  15 reps    Stairs  4RT  reciprocal pattern 1HR cueing to reduce    Rocker Board  2 minutes;Limitations    Rocker Board Limitations  Rt/Lt and A/P    Other Standing Knee Exercises  tandem stance 3x 30" on foam    Other Standing Knee Exercises  sidestep with RTB 2RT               PT Short Term Goals - 08/22/17 1136      PT SHORT TERM GOAL #1   Title  Pt Rt hip pain to be no greater than a 5/10 to allow pt to walk for ten minutes.    Time  2    Period  Weeks    Status  New    Target Date  09/05/17      PT SHORT TERM GOAL #2   Title  PT to only be waking up 1 time a week with right hip pain     Time  2    Period  Weeks    Status  New      PT SHORT TERM GOAL #3   Title  PT to be able to sit for 30 minutes in comfort to be able to eat a meal without pain.     Time  3    Period  Weeks    Status  New        PT Long Term Goals - 08/22/17 1138      PT LONG TERM GOAL #1   Title  Pt to be able to walk for 30 minutes  with no assistive device and no pain to be able to feel confident returning to the [employer]    Time  4    Period  Weeks    Status  New    Target Date  09/19/17      PT LONG TERM GOAL #2   Title  Pt to be able to sit for an hour without increased right hip pain to enjoy going out to eat.     Time  4    Period  Weeks    Status  New      PT LONG TERM GOAL #3   Title  Pt rt hip pain to be no greater than a 2/10 to allow pt to sleep all night without walking from rt hip pain.     Time  4    Period  Weeks      PT LONG TERM GOAL #4   Title  Pt to be able to single leg stance for 10 seconds on both LE to reduce risk of falls.     Time  4    Period  Weeks    Status  New            Plan - 08/31/17 1021    Clinical Impression Statement  Progressed balance with dynamic surface tandem stance (intermittent HHA required for LOB episodes) and functional strengthening with stair training.  Therapist facilitation for form and control with new activities.  No reoprts of pain through session, was limited by fatigue.      Rehab Potential  Good    PT Frequency  3x / week    PT Duration  4 weeks    PT Treatment/Interventions  ADLs/Self Care Home Management;Therapeutic activities;Functional mobility training;Gait training;Stair training;Therapeutic exercise;Balance training;Neuromuscular re-education;Patient/family education;Manual techniques;Dry needling    PT Next Visit Plan  Add vector stance next session.  Progress functional strengthening  and balance as able,  PT Home Exercise Plan  Sitting:  sit to stand, LAQ, Ankle DF/PF, Supine:  SLR, bridging;  SL: hip abduction.        Patient will benefit from skilled therapeutic intervention in order to improve the following deficits and impairments:  Abnormal gait, Decreased activity tolerance, Decreased balance, Decreased strength, Difficulty walking, Pain, Impaired flexibility  Visit Diagnosis: Pain in right hip  Repeated falls  Muscle  weakness (generalized)     Problem List Patient Active Problem List   Diagnosis Date Noted  . Dysphagia, pharyngeal 05/24/2017  . Hyperglycemia 10/12/2014  . Ganglion cyst 09/26/2013  . 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 Urge incontinence 03/25/2013  . Functional constipation 11/20/2012  . Non-ulcer dyspepsia 08/30/2010  . Hyperlipidemia 08/25/2010  . Essential hypertension 08/25/2010  . GERD 08/25/2010   Ihor Austin, [place]; [place]  Aldona Lento 08/31/2017, 10:29 AM  [place], [place], [address] Phone: [phone]   Fax:  [phone]  Name: JOSEFA SYRACUSE MRN: [id number] Date of Birth: Mar 01, 1941

## 2017-09-05 ENCOUNTER — Other Ambulatory Visit: Payer: Self-pay | Admitting: Family Medicine

## 2017-09-05 ENCOUNTER — Encounter (HOSPITAL_COMMUNITY): Payer: Self-pay

## 2017-09-05 ENCOUNTER — Ambulatory Visit (HOSPITAL_COMMUNITY): Payer: Medicare Other

## 2017-09-05 DIAGNOSIS — M25551 Pain in right hip: Secondary | ICD-10-CM

## 2017-09-05 DIAGNOSIS — M6281 Muscle weakness (generalized): Secondary | ICD-10-CM | POA: Diagnosis not present

## 2017-09-05 DIAGNOSIS — R296 Repeated falls: Secondary | ICD-10-CM | POA: Diagnosis not present

## 2017-09-05 NOTE — Patient Instructions (Signed)
Single Leg Balance: Eyes Open    Stand on right leg with eyes open. Hold 30 seconds. 5 reps 2 times per day.  http://ggbe.exer.us/5   Copyright  VHI. All rights reserved.   Tandem Stance    Right foot in front of left, heel touching toe both feet "straight ahead". Stand on Foot Triangle of Support with both feet. Balance in this position 30 seconds. Do with left foot in front of right.  Copyright  VHI. All rights reserved.   Band Walk: Side Stepping    Tie band around legs, just above knees. Step 15 feet to one side, then step back to start. Note: Small towel between band and skin eases rubbing.  http://plyo.exer.us/76   Copyright  VHI. All rights reserved.

## 2017-09-05 NOTE — Therapy (Signed)
[place], [place], [address] Phone: [phone]   Fax:  [phone]  Physical Therapy Treatment  Patient Details  Name: Melissa Barajas MRN: [id number] Date of Birth: 01/23/1941 Referring Provider: Rod Can    Encounter Date: 09/05/2017  PT End of Session - 09/05/17 0955    Visit Number  6    Number of Visits  12    Date for PT Re-Evaluation  09/21/17    Authorization Type  Medicare    Authorization - Visit Number  6    Authorization - Number of Visits  10    PT Start Time  [phone]    PT Stop Time  1030    PT Time Calculation (min)  38 min    Equipment Utilized During Treatment  Gait belt    Activity Tolerance  Patient tolerated treatment well    Behavior During Therapy  [facility] for tasks assessed/performed       Past Medical History:  Diagnosis Date  . Arthritis   . Atrial fibrillation ([place])   . Collagen vascular disease ([place])   . DIVERTICULOSIS OF COLON 08/25/2010   Qualifier: Diagnosis of  By: Hoy Morn    . GERD (gastroesophageal reflux disease)   . HEMORRHOIDS, INTERNAL 08/25/2010   Qualifier: Diagnosis of  By: Hoy Morn    . Hypercholesteremia   . Hypertension   . Lipoma of arm    Left    Past Surgical History:  Procedure Laterality Date  . ABDOMINAL HYSTERECTOMY     Partial  . COLONOSCOPY  2009   SLF: 1. screening colonoscopy in 10 years 2. She should follow a high fiber diet She has been given a hand out on high fiber diverticulosois and hemorrhoids  . CYSTECTOMY     Left breast  . ESOPHAGOGASTRODUODENOSCOPY     [id number] appearing Schatzki's ring, possible cervical esophageal web, both dilated with passage of a Maloney dilator/small hiatal hernia  . ESOPHAGOGASTRODUODENOSCOPY N/A 10/25/2015   Procedure: ESOPHAGOGASTRODUODENOSCOPY (EGD);  Surgeon: Danie Binder, MD;  [facility];  Service: Endoscopy;  Laterality: N/A;  1215pm  . SAVORY DILATION N/A  10/25/2015   Procedure: SAVORY DILATION;  Surgeon: Danie Binder, MD;  [facility];  Service: Endoscopy;  Laterality: N/A;  . THYROIDECTOMY    . Tumor   05/2010   Left breast under nipple  . 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  Subjective Assessment - 09/05/17 0953    Subjective  Pt stated she is feeling good today, no reports of pain currenltly and sleeping better with pillow between knees.  Did have cramping on inner thigh last night     Patient Stated Goals  less pain, to be able to stand and walk longer, to have better activity tolerance     Currently in Pain?  No/denies                      [facility] Adult PT Treatment/Exercise - 09/05/17 0001      Knee/Hip Exercises: Standing   Heel Raises  20 reps    Heel Raises Limitations  toeraises 20 reps    Forward Lunges  15 reps    Forward Lunges Limitations  floor no HHA    Functional Squat  15 reps    Functional Squat Limitations  improved form    Rocker  Board  2 minutes;Limitations    Rocker Board Limitations  Rt/Lt and A/P    SLS  Lt 9", Rt 6" max of     SLS with Vectors  3x 5" BLE intermittent HHA    Other Standing Knee Exercises  tandem stance 3x 30" on foam; tandem gait 2RT    Other Standing Knee Exercises  sidestep with RTB 2RT               PT Short Term Goals - 08/22/17 1136      PT SHORT TERM GOAL #1   Title  Pt Rt hip pain to be no greater than a 5/10 to allow pt to walk for ten minutes.    Time  2    Period  Weeks    Status  New    Target Date  09/05/17      PT SHORT TERM GOAL #2   Title  PT to only be waking up 1 time a week with right hip pain     Time  2    Period  Weeks    Status  New      PT SHORT TERM GOAL #3   Title  PT to be able to sit for 30 minutes in comfort to be able to eat a meal without pain.     Time  3    Period  Weeks    Status  New        PT Long Term Goals - 08/22/17 1138      PT  LONG TERM GOAL #1   Title  Pt to be able to walk for 30 minutes with no assistive device and no pain to be able to feel confident returning to the [employer]    Time  4    Period  Weeks    Status  New    Target Date  09/19/17      PT LONG TERM GOAL #2   Title  Pt to be able to sit for an hour without increased right hip pain to enjoy going out to eat.     Time  4    Period  Weeks    Status  New      PT LONG TERM GOAL #3   Title  Pt rt hip pain to be no greater than a 2/10 to allow pt to sleep all night without walking from rt hip pain.     Time  4    Period  Weeks      PT LONG TERM GOAL #4   Title  Pt to be able to single leg stance for 10 seconds on both LE to reduce risk of falls.     Time  4    Period  Weeks    Status  New            Plan - 09/05/17 1218    Clinical Impression Statement  Continued session focus with functional strengthening and balance trianing.  Pt continues to demonstrate moderate difficutly with dynamic surfaces and SLS activites.  Added vector stance for balance and hip strenghtening with intermittent HHA and therapist facilitaiotn for proper form.  Reviewed HEP with additional balance activities given, encouraged pt to complete next to counter for safety.      Rehab Potential  Good    PT Frequency  3x / week    PT Duration  4 weeks    PT Treatment/Interventions  ADLs/Self Care Home Management;Therapeutic activities;Functional mobility training;Gait  training;Stair training;Therapeutic exercise;Balance training;Neuromuscular re-education;Patient/family education;Manual techniques;Dry needling    PT Next Visit Plan  Progress functional strengthening  and balance as able,      PT Home Exercise Plan  Sitting:  sit to stand, LAQ, Ankle DF/PF, Supine:  SLR, bridging;  SL: hip abduction.; 12/26: SLS, tandem stance and sidestep with RTB       Patient will benefit from skilled therapeutic intervention in order to improve the following deficits and impairments:   Abnormal gait, Decreased activity tolerance, Decreased balance, Decreased strength, Difficulty walking, Pain, Impaired flexibility  Visit Diagnosis: Pain in right hip  Repeated falls  Muscle weakness (generalized)     Problem List Patient Active Problem List   Diagnosis Date Noted  . Dysphagia, pharyngeal 05/24/2017  . Hyperglycemia 10/12/2014  . Ganglion cyst 09/26/2013  . 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 Urge incontinence 03/25/2013  . Functional constipation 11/20/2012  . Non-ulcer dyspepsia 08/30/2010  . Hyperlipidemia 08/25/2010  . Essential hypertension 08/25/2010  . GERD 08/25/2010   Ihor Austin, [place]; [place]  Aldona Lento 09/05/2017, 12:22 PM  [place] [address] [place], [place], [address] Phone: [phone]   Fax:  [phone]  Name: Melissa Barajas MRN: [id number] Date of Birth: Feb 25, 1941

## 2017-09-07 ENCOUNTER — Encounter (HOSPITAL_COMMUNITY): Payer: Self-pay

## 2017-09-07 ENCOUNTER — Ambulatory Visit (HOSPITAL_COMMUNITY): Payer: Medicare Other

## 2017-09-07 DIAGNOSIS — M6281 Muscle weakness (generalized): Secondary | ICD-10-CM

## 2017-09-07 DIAGNOSIS — M25551 Pain in right hip: Secondary | ICD-10-CM

## 2017-09-07 DIAGNOSIS — R296 Repeated falls: Secondary | ICD-10-CM

## 2017-09-07 NOTE — Therapy (Signed)
[place], [place], [address] Phone: [phone]   Fax:  [phone]  Physical Therapy Treatment  Patient Details  Name: Melissa Barajas MRN: [id number] Date of Birth: 01-20-1941 Referring Provider: Rod Can    Encounter Date: 09/07/2017  PT End of Session - 09/07/17 0821    Visit Number  7    Number of Visits  12    Date for PT Re-Evaluation  09/21/17    Authorization Type  Medicare    Authorization - Visit Number  7    Authorization - Number of Visits  10    PT Start Time  0814    PT Stop Time  0858    PT Time Calculation (min)  44 min    Equipment Utilized During Treatment  Gait belt    Activity Tolerance  Patient tolerated treatment well;No increased pain    Behavior During Therapy  WFL for tasks assessed/performed       Past Medical History:  Diagnosis Date  . Arthritis   . Atrial fibrillation ([place])   . Collagen vascular disease ([name])   . DIVERTICULOSIS OF COLON 08/25/2010   Qualifier: Diagnosis of  By: Hoy Morn    . GERD (gastroesophageal reflux disease)   . HEMORRHOIDS, INTERNAL 08/25/2010   Qualifier: Diagnosis of  By: Hoy Morn    . Hypercholesteremia   . Hypertension   . Lipoma of arm    Left    Past Surgical History:  Procedure Laterality Date  . ABDOMINAL HYSTERECTOMY     Partial  . COLONOSCOPY  2009   SLF: 1. screening colonoscopy in 10 years 2. She should follow a high fiber diet She has been given a hand out on high fiber diverticulosois and hemorrhoids  . CYSTECTOMY     Left breast  . ESOPHAGOGASTRODUODENOSCOPY     [id number] appearing Schatzki's ring, possible cervical esophageal web, both dilated with passage of a Maloney dilator/small hiatal hernia  . ESOPHAGOGASTRODUODENOSCOPY N/A 10/25/2015   Procedure: ESOPHAGOGASTRODUODENOSCOPY (EGD);  Surgeon: Danie Binder, MD;  [facility];  Service: Endoscopy;  Laterality: N/A;  1215pm  . SAVORY  DILATION N/A 10/25/2015   Procedure: SAVORY DILATION;  Surgeon: Danie Binder, MD;  [facility];  Service: Endoscopy;  Laterality: N/A;  . THYROIDECTOMY    . Tumor   05/2010   Left breast under nipple  . 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  Subjective Assessment - 09/07/17 0814    Subjective  Pt stated she is feeling great today, no reports of pain currently.  Has began new balance exercises, stated she continues to have difficulty with SLS.      Pertinent History  RA     Patient Stated Goals  less pain, to be able to stand and walk longer, to have better activity tolerance     Currently in Pain?  No/denies                      [facility] Adult PT Treatment/Exercise - 09/07/17 0001      Knee/Hip Exercises: Standing   Heel Raises  20 reps    Heel Raises Limitations  toeraises 20 reps    Forward Lunges  15 reps    Forward Lunges Limitations  floor no HHA    Lateral Step Up  Both;10 reps;Hand Hold: 2;Step Height:  4"    Forward Step Up  10 reps;Hand Hold: 0;Step Height: 6";Both    Functional Squat  15 reps    Functional Squat Limitations  improved form, does continue to require cueing    Rocker Board  2 minutes;Limitations    Rocker Board Limitations  Rt/Lt and A/P minimal HHA    SLS  Rt 10", Lt 8" max of 5    SLS with Vectors  3x 5" BLE intermittent HHA    Other Standing Knee Exercises  tandem stance 3x 30" on foam (able to hold 20" prior HHA); tandem stance with palvo with 1# wand and UE flexion 10x each; tandem and retro gait 2RT    Other Standing Knee Exercises  sidestep on balance beam 2RT;                PT Short Term Goals - 08/22/17 1136      PT SHORT TERM GOAL #1   Title  Pt Rt hip pain to be no greater than a 5/10 to allow pt to walk for ten minutes.    Time  2    Period  Weeks    Status  New    Target Date  09/05/17      PT SHORT TERM GOAL #2   Title  PT to only be  waking up 1 time a week with right hip pain     Time  2    Period  Weeks    Status  New      PT SHORT TERM GOAL #3   Title  PT to be able to sit for 30 minutes in comfort to be able to eat a meal without pain.     Time  3    Period  Weeks    Status  New        PT Long Term Goals - 08/22/17 1138      PT LONG TERM GOAL #1   Title  Pt to be able to walk for 30 minutes with no assistive device and no pain to be able to feel confident returning to the [employer]    Time  4    Period  Weeks    Status  New    Target Date  09/19/17      PT LONG TERM GOAL #2   Title  Pt to be able to sit for an hour without increased right hip pain to enjoy going out to eat.     Time  4    Period  Weeks    Status  New      PT LONG TERM GOAL #3   Title  Pt rt hip pain to be no greater than a 2/10 to allow pt to sleep all night without walking from rt hip pain.     Time  4    Period  Weeks      PT LONG TERM GOAL #4   Title  Pt to be able to single leg stance for 10 seconds on both LE to reduce risk of falls.     Time  4    Period  Weeks    Status  New            Plan - 09/07/17 4540    Clinical Impression Statement  Continued session focus with functional strengthening and balance training.  Pt progressing well with improved SLS and ability to hold tandem stance for 20" on dynamic surface prior LOB.  Progressed functional  strengthening with stair training and balance training on balance beam.  Pt able to complete whole session with no reports of pain.      Rehab Potential  Good    PT Frequency  3x / week    PT Duration  4 weeks    PT Treatment/Interventions  ADLs/Self Care Home Management;Therapeutic activities;Functional mobility training;Gait training;Stair training;Therapeutic exercise;Balance training;Neuromuscular re-education;Patient/family education;Manual techniques;Dry needling    PT Next Visit Plan  Progress functional strengthening  and balance as able, Begin tandem on balance beam and  step down strengthening next session.     PT Home Exercise Plan  Sitting:  sit to stand, LAQ, Ankle DF/PF, Supine:  SLR, bridging;  SL: hip abduction.; 12/26: SLS, tandem stance and sidestep with RTB       Patient will benefit from skilled therapeutic intervention in order to improve the following deficits and impairments:  Abnormal gait, Decreased activity tolerance, Decreased balance, Decreased strength, Difficulty walking, Pain, Impaired flexibility  Visit Diagnosis: Pain in right hip  Repeated falls  Muscle weakness (generalized)     Problem List Patient Active Problem List   Diagnosis Date Noted  . Dysphagia, pharyngeal 05/24/2017  . Hyperglycemia 10/12/2014  . Ganglion cyst 09/26/2013  . 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 Urge incontinence 03/25/2013  . Functional constipation 11/20/2012  . Non-ulcer dyspepsia 08/30/2010  . Hyperlipidemia 08/25/2010  . Essential hypertension 08/25/2010  . GERD 08/25/2010   Ihor [place], [place]; [place]  Aldona Lento 09/07/2017, 10:51 AM  [place] [address] [place], [place], [address] Phone: [phone]   Fax:  [phone]  Name: Melissa Barajas MRN: [id number] Date of Birth: November 24, 1940

## 2017-09-10 ENCOUNTER — Encounter (HOSPITAL_COMMUNITY): Payer: Self-pay

## 2017-09-10 ENCOUNTER — Ambulatory Visit (HOSPITAL_COMMUNITY): Payer: Medicare Other

## 2017-09-10 ENCOUNTER — Other Ambulatory Visit: Payer: Self-pay

## 2017-09-10 DIAGNOSIS — M25551 Pain in right hip: Secondary | ICD-10-CM | POA: Diagnosis not present

## 2017-09-10 DIAGNOSIS — R296 Repeated falls: Secondary | ICD-10-CM | POA: Diagnosis not present

## 2017-09-10 DIAGNOSIS — M6281 Muscle weakness (generalized): Secondary | ICD-10-CM | POA: Diagnosis not present

## 2017-09-10 NOTE — Therapy (Signed)
[place], [place], [address] Phone: [phone]   Fax:  [phone]  Physical Therapy Treatment  Patient Details  Name: Melissa Barajas MRN: [id number] Date of Birth: February 15, 1941 Referring Provider: Rod Can    Encounter Date: 09/10/2017  PT End of Session - 09/10/17 1358    Visit Number  8    Number of Visits  12    Date for PT Re-Evaluation  09/21/17    Authorization Type  Medicare    Authorization - Visit Number  8    Authorization - Number of Visits  10    PT Start Time  1540    PT Stop Time  1428    PT Time Calculation (min)  40 min    Equipment Utilized During Treatment  Gait belt    Activity Tolerance  Patient tolerated treatment well;No increased pain    Behavior During Therapy  WFL for tasks assessed/performed       Past Medical History:  Diagnosis Date  . Arthritis   . Atrial fibrillation ([place])   . Collagen vascular disease ([place])   . DIVERTICULOSIS OF COLON 08/25/2010   Qualifier: Diagnosis of  By: Hoy Morn    . GERD (gastroesophageal reflux disease)   . HEMORRHOIDS, INTERNAL 08/25/2010   Qualifier: Diagnosis of  By: Hoy Morn    . Hypercholesteremia   . Hypertension   . Lipoma of arm    Left    Past Surgical History:  Procedure Laterality Date  . ABDOMINAL HYSTERECTOMY     Partial  . COLONOSCOPY  2009   SLF: 1. screening colonoscopy in 10 years 2. She should follow a high fiber diet She has been given a hand out on high fiber diverticulosois and hemorrhoids  . CYSTECTOMY     Left breast  . ESOPHAGOGASTRODUODENOSCOPY     [id number] appearing Schatzki's ring, possible cervical esophageal web, both dilated with passage of a Maloney dilator/small hiatal hernia  . ESOPHAGOGASTRODUODENOSCOPY N/A 10/25/2015   Procedure: ESOPHAGOGASTRODUODENOSCOPY (EGD);  Surgeon: Danie Binder, MD;  [facility];  Service: Endoscopy;  Laterality: N/A;  1215pm  . SAVORY  DILATION N/A 10/25/2015   Procedure: SAVORY DILATION;  Surgeon: Danie Binder, MD;  [facility];  Service: Endoscopy;  Laterality: N/A;  . THYROIDECTOMY    . Tumor   05/2010   Left breast under nipple  . 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  Subjective Assessment - 09/10/17 1350    Subjective  Patient is feeling well today and denies pain. She is still very challeneged by SLS at the counter however she was able to stand on her right leg for 30 secodns once.     Pertinent History  RA     Patient Stated Goals  less pain, to be able to stand and walk longer, to have better activity tolerance     Currently in Pain?  No/denies       [facility] Adult PT Treatment/Exercise - 09/10/17 0001      Knee/Hip Exercises: Standing   Heel Raises  20 reps;Both 3 second holds    Lateral Step Up  Both;Step Height: 4";15 reps intermittent UE support    Forward Step Up  15 reps;Both;Step Height: 6" intermittent UE support    Rocker Board  2 minutes;Limitations    Rocker Board Limitations  Rt/Lt and A/P minimal  HHA      Balance Exercises - 09/10/17 1404      Balance Exercises: Standing   Tandem Stance  Foam/compliant surface;30 secs;4 reps alternate foot position, no UE support, modified tandem    SLS  Eyes open;Solid surface;Intermittent upper extremity support;Limitations 10x 4 cone taps BLE    Sidestepping  3 reps;Theraband;Limitations 15 ft. both directions, red TB around ankles on last set    Step Over Hurdles / Cones  3x forward/lateral both ways        PT Education - 09/10/17 1356    Education provided  Yes    Education Details  educated on form/technique throughout session; educated on increasing side stepping challenge at home for band at mid calf level    Person(s) Educated  Patient    Methods  Explanation    Comprehension  Verbalized understanding;Returned demonstration       PT Short Term Goals - 08/22/17 1136       PT SHORT TERM GOAL #1   Title  Pt Rt hip pain to be no greater than a 5/10 to allow pt to walk for ten minutes.    Time  2    Period  Weeks    Status  New    Target Date  09/05/17      PT SHORT TERM GOAL #2   Title  PT to only be waking up 1 time a week with right hip pain     Time  2    Period  Weeks    Status  New      PT SHORT TERM GOAL #3   Title  PT to be able to sit for 30 minutes in comfort to be able to eat a meal without pain.     Time  3    Period  Weeks    Status  New        PT Long Term Goals - 08/22/17 1138      PT LONG TERM GOAL #1   Title  Pt to be able to walk for 30 minutes with no assistive device and no pain to be able to feel confident returning to the [employer]    Time  4    Period  Weeks    Status  New    Target Date  09/19/17      PT LONG TERM GOAL #2   Title  Pt to be able to sit for an hour without increased right hip pain to enjoy going out to eat.     Time  4    Period  Weeks    Status  New      PT LONG TERM GOAL #3   Title  Pt rt hip pain to be no greater than a 2/10 to allow pt to sleep all night without walking from rt hip pain.     Time  4    Period  Weeks      PT LONG TERM GOAL #4   Title  Pt to be able to single leg stance for 10 seconds on both LE to reduce risk of falls.     Time  4    Period  Weeks    Status  New        Plan - 09/10/17 1400    Clinical Impression Statement  Patient is progressing well with therapy and advanced dynamic balance activities today. She demonstrated improved SLS balance control and postural reactions and advanced functional strengthening for gluteus  medius. She continues to have greater difficulty with right SLS activities compared to left and is challenged by dynamic surfaces. Ms. Blanks will continue to benefit from skilled Pt services to address current impairments and progress towards goals to decrease fall risk and improve QOL.    Rehab Potential  Good    PT Frequency  3x / week    PT Duration   4 weeks    PT Treatment/Interventions  ADLs/Self Care Home Management;Therapeutic activities;Functional mobility training;Gait training;Stair training;Therapeutic exercise;Balance training;Neuromuscular re-education;Patient/family education;Manual techniques;Dry needling    PT Next Visit Plan  Progress functional strengthening  and balance as able, Continue with tandem on balance beam and begin step down strengthening next session. Continue with obstacles and cone taps for SLS balacne training.    PT Home Exercise Plan  Sitting:  sit to stand, LAQ, Ankle DF/PF, Supine:  SLR, bridging;  SL: hip abduction.; 12/26: SLS, tandem stance and sidestep with RTB    Consulted and Agree with Plan of Care  Patient       Patient will benefit from skilled therapeutic intervention in order to improve the following deficits and impairments:  Abnormal gait, Decreased activity tolerance, Decreased balance, Decreased strength, Difficulty walking, Pain, Impaired flexibility  Visit Diagnosis: Pain in right hip  Repeated falls  Muscle weakness (generalized)     Problem List Patient Active Problem List   Diagnosis Date Noted  . Dysphagia, pharyngeal 05/24/2017  . Hyperglycemia 10/12/2014  . Ganglion cyst 09/26/2013  . 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 Urge incontinence 03/25/2013  . Functional constipation 11/20/2012  . Non-ulcer dyspepsia 08/30/2010  . Hyperlipidemia 08/25/2010  . Essential hypertension 08/25/2010  . GERD 08/25/2010    Kipp Brood, PT, DPT Physical Therapist with [facility]  09/10/2017 2:38 PM    [place], [place], [address] Phone: [phone]   Fax:  [phone]  Name: Melissa Barajas MRN: [id number] Date of Birth: 1941/06/19

## 2017-09-12 ENCOUNTER — Encounter (HOSPITAL_COMMUNITY): Payer: Self-pay

## 2017-09-12 ENCOUNTER — Ambulatory Visit (HOSPITAL_COMMUNITY): Payer: Medicare Other | Attending: Orthopedic Surgery

## 2017-09-12 ENCOUNTER — Other Ambulatory Visit: Payer: Self-pay

## 2017-09-12 DIAGNOSIS — M6281 Muscle weakness (generalized): Secondary | ICD-10-CM | POA: Diagnosis not present

## 2017-09-12 DIAGNOSIS — R296 Repeated falls: Secondary | ICD-10-CM

## 2017-09-12 DIAGNOSIS — M25551 Pain in right hip: Secondary | ICD-10-CM | POA: Diagnosis not present

## 2017-09-12 NOTE — Therapy (Signed)
[place], [place], [address] Phone: [phone]   Fax:  [phone]  Physical Therapy Treatment  Patient Details  Name: Melissa Barajas MRN: [id number] Date of Birth: 1940-10-14 Referring Provider: Rod Can    Encounter Date: 09/12/2017  PT End of Session - 09/12/17 0911    Visit Number  9    Number of Visits  12    Date for PT Re-Evaluation  09/21/17    Authorization Type  Medicare    Authorization - Visit Number  9    Authorization - Number of Visits  10    PT Start Time  0903    PT Stop Time  0944    PT Time Calculation (min)  41 min    Equipment Utilized During Treatment  Gait belt    Activity Tolerance  Patient tolerated treatment well    Behavior During Therapy  [facility] for tasks assessed/performed       Past Medical History:  Diagnosis Date  . Arthritis   . Atrial fibrillation ([place])   . Collagen vascular disease ([place])   . DIVERTICULOSIS OF COLON 08/25/2010   Qualifier: Diagnosis of  By: Hoy Morn    . GERD (gastroesophageal reflux disease)   . HEMORRHOIDS, INTERNAL 08/25/2010   Qualifier: Diagnosis of  By: Hoy Morn    . Hypercholesteremia   . Hypertension   . Lipoma of arm    Left    Past Surgical History:  Procedure Laterality Date  . ABDOMINAL HYSTERECTOMY     Partial  . COLONOSCOPY  2009   SLF: 1. screening colonoscopy in 10 years 2. She should follow a high fiber diet She has been given a hand out on high fiber diverticulosois and hemorrhoids  . CYSTECTOMY     Left breast  . ESOPHAGOGASTRODUODENOSCOPY     [id number] appearing Schatzki's ring, possible cervical esophageal web, both dilated with passage of a Maloney dilator/small hiatal hernia  . ESOPHAGOGASTRODUODENOSCOPY N/A 10/25/2015   Procedure: ESOPHAGOGASTRODUODENOSCOPY (EGD);  Surgeon: Danie Binder, MD;  [facility];  Service: Endoscopy;  Laterality: N/A;  1215pm  . SAVORY DILATION N/A 10/25/2015    Procedure: SAVORY DILATION;  Surgeon: Danie Binder, MD;  [facility];  Service: Endoscopy;  Laterality: N/A;  . THYROIDECTOMY    . Tumor   05/2010   Left breast under nipple  . 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  Subjective Assessment - 09/12/17 0906    Subjective  Patient reports before she got here this morning she was having some discomfort in her right groin but it is already gone. She reports she was able to advance for side stepping exercise with theraband lower with no increase in soreness or discomfort.    Pertinent History  RA     Patient Stated Goals  less pain, to be able to stand and walk longer, to have better activity tolerance     Currently in Pain?  No/denies       [facility] Adult PT Treatment/Exercise - 09/12/17 0001      Knee/Hip Exercises: Standing   Heel Raises  20 reps;Both    Lateral Step Up  20 reps;Hand Hold: 0;1 set;Step Height: 4";Both    Forward Step Up  20 reps;Hand Hold: 0;Both;Step Height: 6"    Step Down  Step Height: 4";Both;Hand Hold: 2;15 reps  Rocker Board  2 minutes;Limitations    Rocker Board Limitations  Rt/Lt and A/P minimal HHA; 2x 1 minute each      Balance Exercises - 09/12/17 0918      Balance Exercises: Standing   Tandem Stance  Foam/compliant surface;30 secs;4 reps modified tandem, alternating foot position, no UE support    Balance Beam  6x forward/tandem gait on foam balance beam; 3x both directions side steppign on foam balance beam    Step Over Hurdles / Cones  3x lateral both ways with 4 low hurdles    Marching Limitations  2x 1 minute on foam, 3 second holds for SLS bilatearlly       PT Education - 09/12/17 0909    Education provided  Yes    Education Details  educated on form/technique throughout session. Educated on weight shifting for balance activities.    Person(s) Educated  Patient    Methods  Explanation    Comprehension  Verbalized  understanding       PT Short Term Goals - 08/22/17 1136      PT SHORT TERM GOAL #1   Title  Pt Rt hip pain to be no greater than a 5/10 to allow pt to walk for ten minutes.    Time  2    Period  Weeks    Status  New    Target Date  09/05/17      PT SHORT TERM GOAL #2   Title  PT to only be waking up 1 time a week with right hip pain     Time  2    Period  Weeks    Status  New      PT SHORT TERM GOAL #3   Title  PT to be able to sit for 30 minutes in comfort to be able to eat a meal without pain.     Time  3    Period  Weeks    Status  New        PT Long Term Goals - 08/22/17 1138      PT LONG TERM GOAL #1   Title  Pt to be able to walk for 30 minutes with no assistive device and no pain to be able to feel confident returning to the [employer]    Time  4    Period  Weeks    Status  New    Target Date  09/19/17      PT LONG TERM GOAL #2   Title  Pt to be able to sit for an hour without increased right hip pain to enjoy going out to eat.     Time  4    Period  Weeks    Status  New      PT LONG TERM GOAL #3   Title  Pt rt hip pain to be no greater than a 2/10 to allow pt to sleep all night without walking from rt hip pain.     Time  4    Period  Weeks      PT LONG TERM GOAL #4   Title  Pt to be able to single leg stance for 10 seconds on both LE to reduce risk of falls.     Time  4    Period  Weeks    Status  New        Plan - 09/12/17 0911    Clinical Impression Statement  Patient continues to progress with therapy and  advanced functional strengthening exercises today in SLS to challenge dynamic balance today. She is limited by decreased gluteus medius endurance and she continues to be challenged with right SLS activities compared to left and remains challenged by dynamic surfaces. Ms. Creson will continue to benefit from skilled Pt services to address current impairments and progress towards goals to decrease fall risk and improve QOL.    Rehab Potential  Good    PT  Frequency  3x / week    PT Duration  4 weeks    PT Treatment/Interventions  ADLs/Self Care Home Management;Therapeutic activities;Functional mobility training;Gait training;Stair training;Therapeutic exercise;Balance training;Neuromuscular re-education;Patient/family education;Manual techniques;Dry needling    PT Next Visit Plan  Update HEP next session. Advance step height with latearl step up. Progress functional strengthening  and balance as able, Continue with tandem on balance beam and step down strengthening next session. Continue with obstacles and cone taps for SLS balacne training.    PT Home Exercise Plan  Sitting:  sit to stand, LAQ, Ankle DF/PF, Supine:  SLR, bridging;  SL: hip abduction.; 12/26: SLS, tandem stance and sidestep with RTB    Consulted and Agree with Plan of Care  Patient       Patient will benefit from skilled therapeutic intervention in order to improve the following deficits and impairments:  Abnormal gait, Decreased activity tolerance, Decreased balance, Decreased strength, Difficulty walking, Pain, Impaired flexibility  Visit Diagnosis: Pain in right hip  Repeated falls  Muscle weakness (generalized)     Problem List Patient Active Problem List   Diagnosis Date Noted  . Dysphagia, pharyngeal 05/24/2017  . Hyperglycemia 10/12/2014  . Ganglion cyst 09/26/2013  . 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 Urge incontinence 03/25/2013  . Functional constipation 11/20/2012  . Non-ulcer dyspepsia 08/30/2010  . Hyperlipidemia 08/25/2010  . Essential hypertension 08/25/2010  . GERD 08/25/2010    Kipp Brood, PT, DPT Physical Therapist with [facility]  09/12/2017 9:47 AM    [place], [place], [address] Phone: [phone]   Fax:  [phone]  Name: Melissa Barajas MRN: [id number] Date of Birth: June 26, 1941

## 2017-09-14 ENCOUNTER — Ambulatory Visit (HOSPITAL_COMMUNITY): Payer: Medicare Other | Admitting: Physical Therapy

## 2017-09-14 ENCOUNTER — Encounter (HOSPITAL_COMMUNITY): Payer: Self-pay | Admitting: Physical Therapy

## 2017-09-14 DIAGNOSIS — M6281 Muscle weakness (generalized): Secondary | ICD-10-CM

## 2017-09-14 DIAGNOSIS — R296 Repeated falls: Secondary | ICD-10-CM | POA: Diagnosis not present

## 2017-09-14 DIAGNOSIS — M25551 Pain in right hip: Secondary | ICD-10-CM | POA: Diagnosis not present

## 2017-09-14 NOTE — Therapy (Signed)
[facility] [address] [place], [place], [address] Phone: [phone]   Fax:  [phone]  Physical Therapy Treatment/Reassess  Patient Details  Name: Melissa Barajas MRN: [id number] Date of Birth: 1941/04/26 Referring Provider: Ola Spurr    Encounter Date: 09/14/2017  PT End of Session - 09/14/17 0959    Visit Number  10    Number of Visits  24    Date for PT Re-Evaluation  10/14/17    Authorization Type  Medicare    Authorization - Visit Number  10    Authorization - Number of Visits  24    PT Start Time  [phone]    PT Stop Time  1035    PT Time Calculation (min)  39 min    Equipment Utilized During Treatment  Gait belt    Activity Tolerance  Patient tolerated treatment well    Behavior During Therapy  [facility] for tasks assessed/performed       Past Medical History:  Diagnosis Date  . Arthritis   . Atrial fibrillation ([place])   . Collagen vascular disease ([place])   . DIVERTICULOSIS OF COLON 08/25/2010   Qualifier: Diagnosis of  By: Hoy Morn    . GERD (gastroesophageal reflux disease)   . HEMORRHOIDS, INTERNAL 08/25/2010   Qualifier: Diagnosis of  By: Hoy Morn    . Hypercholesteremia   . Hypertension   . Lipoma of arm    Left    Past Surgical History:  Procedure Laterality Date  . ABDOMINAL HYSTERECTOMY     Partial  . COLONOSCOPY  2009   SLF: 1. screening colonoscopy in 10 years 2. She should follow a high fiber diet She has been given a hand out on high fiber diverticulosois and hemorrhoids  . CYSTECTOMY     Left breast  . ESOPHAGOGASTRODUODENOSCOPY     [id number] appearing Schatzki's ring, possible cervical esophageal web, both dilated with passage of a Maloney dilator/small hiatal hernia  . ESOPHAGOGASTRODUODENOSCOPY N/A 10/25/2015   Procedure: ESOPHAGOGASTRODUODENOSCOPY (EGD);  Surgeon: Danie Binder, MD;  [facility];  Service: Endoscopy;  Laterality: N/A;  1215pm  . SAVORY DILATION  N/A 10/25/2015   Procedure: SAVORY DILATION;  Surgeon: Danie Binder, MD;  [facility];  Service: Endoscopy;  Laterality: N/A;  . THYROIDECTOMY    . Tumor   05/2010   Left breast under nipple  . 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  Subjective Assessment - 09/14/17 1002    Subjective  Pt states that she is getting stronger and her balance is a little better.  She is still having pain in her hip but it is better 50 percent better.    Pertinent History  RA     How long can you sit comfortably?  no problem     How long can you stand comfortably?  15 minutes     How long can you walk comfortably?  30 minutes     Patient Stated Goals  less pain, to be able to stand and walk longer, to have better activity tolerance     Currently in Pain?  Yes    Pain Score  1     Pain Location  Hip    Pain Orientation  Right    Pain Descriptors / Indicators  Tender    Pain Type  Chronic pain    Pain Onset  More than a month ago    Pain Frequency  Intermittent    Aggravating Factors   weight bearing          [facility] PT Assessment - 09/14/17 0001      Assessment   Medical Diagnosis  Rt trochanteric bursitis    Referring Provider  Aaron Edelman [place]     Onset Date/Surgical Date  -- unknown     Next MD Visit  not scheduled     Prior Therapy  none      Precautions   Precautions  None      Restrictions   Weight Bearing Restrictions  No      Home Environment   Living Environment  Private residence    Home Access  Stairs to enter    Entrance Stairs-Number of Steps  2    Home Layout  Laundry or work area in basement      Prior Function   Level of Independence  Independent    Vocation  Retired    Scientist, 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 ; going to bible study which requires a lot of walking and stair climbing       Cognition   Overall Cognitive Status  Within Functional Limits for tasks assessed       Observation/Other Assessments   Focus on Therapeutic Outcomes (FOTO)   48 was 45      Functional Tests   Functional tests  Single leg stance;Sit to Stand      Single Leg Stance   Comments  Rt:  8 section was 1 second;  LT 8 was 1 second       Sit to Stand   Comments  5x13.13 was 25.95      Strength   Right Hip Flexion  3/5 was 2+/5     Right Hip Extension  3-/5 was 2/5    Right Hip ABduction  3-/5 was 2+    Left Hip Flexion  4/5 was 3+/5     Left Hip Extension  4-/5 was 3/5     Left Hip ABduction  3-/5    Right Knee Flexion  4-/5 was 3+    Right Knee Extension  3/5    Left Knee Flexion  5/5 was 4/5     Left Knee Extension  3/5    Right Ankle Dorsiflexion  5/5 was 3+/5     Right Ankle Plantar Flexion  3+/5 was 3+/5    Left Ankle Dorsiflexion  4+/5 was 3+/5    Left Ankle Plantar Flexion  3+/5 was 3+/5                  [facility] Adult PT Treatment/Exercise - 09/14/17 0001      Exercises   Exercises  Knee/Hip      Knee/Hip Exercises: Stretches   ITB Stretch  2 reps;30 seconds      Knee/Hip Exercises: Standing   Heel Raises  10 reps      Knee/Hip Exercises: Seated   Sit to Sand  5 reps      Knee/Hip Exercises: Sidelying   Hip ABduction  Both;10 reps      Knee/Hip Exercises: Prone   Hamstring Curl  10 reps    Hamstring Curl Limitations  4#     Hip Extension  10 reps             PT Education - 09/14/17 1139    Education provided  Yes    Education Details  updated HEP    Person(s) Educated  Patient    Methods  Explanation;Handout    Comprehension  Verbalized understanding;Returned demonstration       PT Short Term Goals - 09/14/17 1030      PT SHORT TERM GOAL #1   Title  Pt Rt hip pain to be no greater than a 5/10 to allow pt to walk for ten minutes.    Time  2    Period  Weeks    Status  On-going      PT SHORT TERM GOAL #2   Title  PT to only be waking up 1 time a week with right hip pain     Time  2    Period  Weeks    Status   On-going      PT SHORT TERM GOAL #3   Title  PT to be able to sit for 30 minutes in comfort to be able to eat a meal without pain.     Time  3    Period  Weeks    Status  Achieved        PT Long Term Goals - 09/14/17 1031      PT LONG TERM GOAL #1   Title  Pt to be able to walk for 30 minutes with no assistive device and no pain to be able to feel confident returning to the [employer]    Time  4    Period  Weeks    Status  On-going      PT LONG TERM GOAL #2   Title  Pt to be able to sit for an hour without increased right hip pain to enjoy going out to eat.     Time  4    Period  Weeks    Status  Achieved      PT LONG TERM GOAL #3   Title  Pt rt hip pain to be no greater than a 2/10 to allow pt to sleep all night without walking from rt hip pain.     Time  4    Period  Weeks    Status  On-going      PT LONG TERM GOAL #4   Title  Pt to be able to single leg stance for 10 seconds on both LE to reduce risk of falls.     Time  4    Period  Weeks    Status  On-going            Plan - 09/14/17 1032    Clinical Impression Statement  PT reassessed today.  She has made gains in all aspects but remains limited in all aspects as well.  Pt continues to have decreased strength, decreased balance, and increased  pain in her right hip limiting her function.  Ms. Rogoff will benefit from continued physical therapy to maximize her functional potential.     Rehab Potential  Good    PT Frequency  3x / week    PT Duration  8 weeks 4 additional weeks.     PT Treatment/Interventions  ADLs/Self Care Home Management;Therapeutic activities;Functional mobility training;Gait training;Stair training;Therapeutic exercise;Balance training;Neuromuscular re-education;Patient/family education;Manual techniques;Dry needling    PT Next Visit Plan  . Advance step height with latearl step up. Progress functional strengthening  and balance as able, Continue with tandem on balance beam and step down strengthening  next session. Continue with obstacles and cone taps for SLS balacne  training.    PT Home Exercise Plan  Sitting:  sit to stand, LAQ, Ankle DF/PF, Supine:  SLR, bridging;  SL: hip abduction.; 12/26: SLS, tandem stance and sidestep with RTB    Consulted and Agree with Plan of Care  Patient       Patient will benefit from skilled therapeutic intervention in order to improve the following deficits and impairments:  Abnormal gait, Decreased activity tolerance, Decreased balance, Decreased strength, Difficulty walking, Pain, Impaired flexibility  Visit Diagnosis: Pain in right hip  Repeated falls  Muscle weakness (generalized)     Problem List Patient Active Problem List   Diagnosis Date Noted  . Dysphagia, pharyngeal 05/24/2017  . Hyperglycemia 10/12/2014  . Ganglion cyst 09/26/2013  . 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 Urge incontinence 03/25/2013  . Functional constipation 11/20/2012  . Non-ulcer dyspepsia 08/30/2010  . Hyperlipidemia 08/25/2010  . Essential hypertension 08/25/2010  . GERD 08/25/2010    Rayetta Humphrey, PT CLT [phone] 09/14/2017, 11:43 AM  [facility] [address] [place], [place], [address] Phone: [phone]   Fax:  [phone]  Name: SUMIRE HALBLEIB MRN: [id number] Date of Birth: 11/05/1940

## 2017-09-14 NOTE — Patient Instructions (Signed)
Straight Leg Raise (Prone)    Abdomen and head supported, keep left knee locked and raise leg at hip. Avoid arching low back. Repeat _10___ times per set. Do _1___ sets per session. Do _2___ sessions per day.  http://orth.exer.us/1112   Copyright  VHI. All rights reserved.  Self-Mobilization: Knee Flexion (Prone)   With leg weight on right ankle  Bring right  heel toward buttocks as close as possible. Hold ___5_ seconds. Relax. Repeat ___10_ times per set. Do _1___ sets per session. Do __2__ sessions per day.  http://orth.exer.us/596   Copyright  VHI. All rights reserved.  Heel Raise: Bilateral (Standing)    Rise on balls of feet. Repeat 15____ times per set. Do __1__ sets per session. Do __2__ sessions per day.  http://orth.exer.us/38   Copyright  VHI. All rights reserved.  Iliotibial Band Stretch, Standing    Stand, hands on hips, left   leg crossed in front of other leg. Lean to same side as front leg until stretch is felt on other hip. Hold __30_ seconds. Change foot position and lean to same side. Hold __30_ seconds. Repeat _3__ times per session. Do __2_ sessions per day.  Copyright  VHI. All rights reserved.

## 2017-09-17 ENCOUNTER — Ambulatory Visit (HOSPITAL_COMMUNITY): Payer: Medicare Other

## 2017-09-17 DIAGNOSIS — R296 Repeated falls: Secondary | ICD-10-CM

## 2017-09-17 DIAGNOSIS — M25551 Pain in right hip: Secondary | ICD-10-CM | POA: Diagnosis not present

## 2017-09-17 DIAGNOSIS — M6281 Muscle weakness (generalized): Secondary | ICD-10-CM | POA: Diagnosis not present

## 2017-09-17 NOTE — Therapy (Signed)
[facility] [place], [place], [address] Phone: [phone]   Fax:  [phone]  Physical Therapy Treatment  Patient Details  Name: Melissa Barajas MRN: [id number] Date of Birth: 08/31/1941 Referring Provider: Ola Spurr    Encounter Date: 09/17/2017  PT End of Session - 09/17/17 1356    Visit Number  11    Number of Visits  24    Date for PT Re-Evaluation  10/14/17    Authorization Type  Medicare    Authorization - Visit Number  11    Authorization - Number of Visits  24    PT Start Time  7035    PT Stop Time  1427    PT Time Calculation (min)  38 min    Equipment Utilized During Treatment  Gait belt    Activity Tolerance  Patient tolerated treatment well    Behavior During Therapy  [facility] for tasks assessed/performed       Past Medical History:  Diagnosis Date  . Arthritis   . Atrial fibrillation ([place])   . Collagen vascular disease ([place])   . DIVERTICULOSIS OF COLON 08/25/2010   Qualifier: Diagnosis of  By: Hoy Morn    . GERD (gastroesophageal reflux disease)   . HEMORRHOIDS, INTERNAL 08/25/2010   Qualifier: Diagnosis of  By: Hoy Morn    . Hypercholesteremia   . Hypertension   . Lipoma of arm    Left    Past Surgical History:  Procedure Laterality Date  . ABDOMINAL HYSTERECTOMY     Partial  . COLONOSCOPY  2009   SLF: 1. screening colonoscopy in 10 years 2. She should follow a high fiber diet She has been given a hand out on high fiber diverticulosois and hemorrhoids  . CYSTECTOMY     Left breast  . ESOPHAGOGASTRODUODENOSCOPY     [id number] appearing Schatzki's ring, possible cervical esophageal web, both dilated with passage of a Maloney dilator/small hiatal hernia  . ESOPHAGOGASTRODUODENOSCOPY N/A 10/25/2015   Procedure: ESOPHAGOGASTRODUODENOSCOPY (EGD);  Surgeon: Danie Binder, MD;  [facility];  Service: Endoscopy;  Laterality: N/A;  1215pm  . SAVORY DILATION N/A  10/25/2015   Procedure: SAVORY DILATION;  Surgeon: Danie Binder, MD;  [facility];  Service: Endoscopy;  Laterality: N/A;  . THYROIDECTOMY    . Tumor   05/2010   Left breast under nipple  . 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  Subjective Assessment - 09/17/17 1351    Subjective  Pt reports she continues to do well. She is not waking in pain any more. She said HEP complaicne was limited over the weekend d/t plans with her sister in [place].     Pertinent History  RA     Limitations  Sitting;Lifting;Standing;Walking    Currently in Pain?  No/denies           [facility] Adult PT Treatment/Exercise - 09/17/17 0001      Knee/Hip Exercises: Stretches   Other Knee/Hip Stretches  Supine ITB stretch: 2x60sec Supine blute med stretch: 2x60sec    Other Knee/Hip Stretches  SKTC: 2x30sec      Knee/Hip Exercises: Standing   Heel Raises  Both;20 reps    Lateral Step Up  Left;Right;1 set;10 reps;Hand Hold: 2;Step Height: 6" progressed height this session    Forward Step Up  Right;Left;1 set;10 reps;Hand Hold: 2;Step Height: 8" progressed step  height this session      Knee/Hip Exercises: Seated   Sit to Sand  5 reps      Knee/Hip Exercises: Supine   Bridges with Diona Foley Squeeze  1 set;Both;Strengthening;10 reps    Bridges with Clamshell  1 set;Both;10 reps;Strengthening GreenTB      Knee/Hip Exercises: Sidelying   Hip ABduction  Both;10 reps;2 sets;AROM;AAROM;Strengthening requires AA/ROM on right side      Knee/Hip Exercises: Prone   Hamstring Curl  10 reps;Limitations;2 sets    Hamstring Curl Limitations  4#  Rightlimited by rectus tightness      Manual Therapy   Manual Therapy  Myofascial release    Myofascial Release  Rt glute med/glute min: 5 minutes good tissue intregity in general. minimal taut bands present          PT Short Term Goals - 09/14/17 1030      PT SHORT TERM GOAL #1   Title  Pt Rt hip  pain to be no greater than a 5/10 to allow pt to walk for ten minutes.    Time  2    Period  Weeks    Status  On-going      PT SHORT TERM GOAL #2   Title  PT to only be waking up 1 time a week with right hip pain     Time  2    Period  Weeks    Status  On-going      PT SHORT TERM GOAL #3   Title  PT to be able to sit for 30 minutes in comfort to be able to eat a meal without pain.     Time  3    Period  Weeks    Status  Achieved        PT Long Term Goals - 09/14/17 1031      PT LONG TERM GOAL #1   Title  Pt to be able to walk for 30 minutes with no assistive device and no pain to be able to feel confident returning to the [employer]    Time  4    Period  Weeks    Status  On-going      PT LONG TERM GOAL #2   Title  Pt to be able to sit for an hour without increased right hip pain to enjoy going out to eat.     Time  4    Period  Weeks    Status  Achieved      PT LONG TERM GOAL #3   Title  Pt rt hip pain to be no greater than a 2/10 to allow pt to sleep all night without walking from rt hip pain.     Time  4    Period  Weeks    Status  On-going      PT LONG TERM GOAL #4   Title  Pt to be able to single leg stance for 10 seconds on both LE to reduce risk of falls.     Time  4    Period  Weeks    Status  On-going            Plan - 09/17/17 1358    Clinical Impression Statement  COntinued with current program, strretching, and strengthening focus from recent reassessmen tvisit. Pt making good progress toward goals overall, with continued decrease in pain at night. Hips remain somewhat week and insufficicent in control of gait mechanics grossly. Able to progress loading this  session, and progress step height for both step-ups.     Rehab Potential  Good    PT Frequency  3x / week    PT Duration  8 weeks    PT Treatment/Interventions  ADLs/Self Care Home Management;Therapeutic activities;Functional mobility training;Gait training;Stair training;Therapeutic exercise;Balance  training;Neuromuscular re-education;Patient/family education;Manual techniques;Dry needling    PT Next Visit Plan  . Advance step height with latearl step up. Progress functional strengthening  and balance as able, Continue with tandem on balance beam and step down strengthening next session. Continue with obstacles and cone taps for SLS balacne training.    PT Home Exercise Plan  Sitting:  sit to stand, LAQ, Ankle DF/PF, Supine:  SLR, bridging;  SL: hip abduction.; 12/26: SLS, tandem stance and sidestep with RTB    Consulted and Agree with Plan of Care  Patient       Patient will benefit from skilled therapeutic intervention in order to improve the following deficits and impairments:  Abnormal gait, Decreased activity tolerance, Decreased balance, Decreased strength, Difficulty walking, Pain, Impaired flexibility  Visit Diagnosis: Pain in right hip  Repeated falls  Muscle weakness (generalized)     Problem List Patient Active Problem List   Diagnosis Date Noted  . Dysphagia, pharyngeal 05/24/2017  . Hyperglycemia 10/12/2014  . Ganglion cyst 09/26/2013  . 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 Urge incontinence 03/25/2013  . Functional constipation 11/20/2012  . Non-ulcer dyspepsia 08/30/2010  . Hyperlipidemia 08/25/2010  . Essential hypertension 08/25/2010  . GERD 08/25/2010   2:27 PM, 09/17/17 Etta Grandchild, PT, DPT Physical Therapist at [facility] [phone] (office)     Etta Grandchild 09/17/2017, 2:27 PM  [facility] [address] [place], [place], [address] Phone: [phone]   Fax:  [phone]  Name: Melissa Barajas MRN: [id number] Date of Birth: Jan 25, 1941

## 2017-09-19 ENCOUNTER — Ambulatory Visit (HOSPITAL_COMMUNITY): Payer: Medicare Other | Admitting: Physical Therapy

## 2017-09-19 DIAGNOSIS — M25551 Pain in right hip: Secondary | ICD-10-CM

## 2017-09-19 DIAGNOSIS — R296 Repeated falls: Secondary | ICD-10-CM | POA: Diagnosis not present

## 2017-09-19 DIAGNOSIS — M6281 Muscle weakness (generalized): Secondary | ICD-10-CM | POA: Diagnosis not present

## 2017-09-19 NOTE — Therapy (Signed)
[facility] [address] [place], [place], [address] Phone: [phone]   Fax:  [phone]  Physical Therapy Treatment  Patient Details  Name: Melissa Barajas MRN: [id number] Date of Birth: 25-Aug-1941 Referring Provider: Ola Spurr    Encounter Date: 09/19/2017  PT End of Session - 09/19/17 1301    Visit Number  12    Number of Visits  24    Date for PT Re-Evaluation  10/14/17    Authorization Type  Medicare    Authorization - Visit Number  12    Authorization - Number of Visits  24    PT Start Time  0903    PT Stop Time  0945    PT Time Calculation (min)  42 min    Equipment Utilized During Treatment  Gait belt    Activity Tolerance  Patient tolerated treatment well    Behavior During Therapy  [facility] for tasks assessed/performed       Past Medical History:  Diagnosis Date  . Arthritis   . Atrial fibrillation ([place])   . Collagen vascular disease ([place])   . DIVERTICULOSIS OF COLON 08/25/2010   Qualifier: Diagnosis of  By: Hoy Morn    . GERD (gastroesophageal reflux disease)   . HEMORRHOIDS, INTERNAL 08/25/2010   Qualifier: Diagnosis of  By: Hoy Morn    . Hypercholesteremia   . Hypertension   . Lipoma of arm    Left    Past Surgical History:  Procedure Laterality Date  . ABDOMINAL HYSTERECTOMY     Partial  . COLONOSCOPY  2009   SLF: 1. screening colonoscopy in 10 years 2. She should follow a high fiber diet She has been given a hand out on high fiber diverticulosois and hemorrhoids  . CYSTECTOMY     Left breast  . ESOPHAGOGASTRODUODENOSCOPY     [id number] appearing Schatzki's ring, possible cervical esophageal web, both dilated with passage of a Maloney dilator/small hiatal hernia  . ESOPHAGOGASTRODUODENOSCOPY N/A 10/25/2015   Procedure: ESOPHAGOGASTRODUODENOSCOPY (EGD);  Surgeon: Danie Binder, MD;  [facility];  Service: Endoscopy;  Laterality: N/A;  1215pm  . SAVORY DILATION N/A  10/25/2015   Procedure: SAVORY DILATION;  Surgeon: Danie Binder, MD;  [facility];  Service: Endoscopy;  Laterality: N/A;  . THYROIDECTOMY    . Tumor   05/2010   Left breast under nipple  . 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  Subjective Assessment - 09/19/17 0909    Subjective  Pt states she continues to improve.  Currently not in pain and reports compliance with HEP.    Currently in Pain?  No/denies                      [facility] Adult PT Treatment/Exercise - 09/19/17 0001      Knee/Hip Exercises: Standing   Heel Raises  Both;20 reps    Heel Raises Limitations  toeraises 20 reps    Forward Lunges  Both;1 set;10 reps    Forward Lunges Limitations  onto 4" step no UE assist    Lateral Step Up  Left;Right;1 set;10 reps;Hand Hold: 2;Step Height: 8"    Forward Step Up  Right;Left;1 set;10 reps;Hand Hold: 2;Step Height: 8"      Knee/Hip Exercises: Seated   Sit to Sand  10 reps;without UE support      Knee/Hip Exercises:  Supine   Bridges with Diona Foley Squeeze  Both;Strengthening;10 reps;2 sets    Constance Haw with Clamshell  Both;10 reps;Strengthening;2 sets      Knee/Hip Exercises: Sidelying   Clams  both 2X10 reps with GTB      Knee/Hip Exercises: Prone   Hamstring Curl  Limitations;2 sets;20 reps    Hamstring Curl Limitations  4#     Hip Extension  10 reps;2 sets               PT Short Term Goals - 09/14/17 1030      PT SHORT TERM GOAL #1   Title  Pt Rt hip pain to be no greater than a 5/10 to allow pt to walk for ten minutes.    Time  2    Period  Weeks    Status  On-going      PT SHORT TERM GOAL #2   Title  PT to only be waking up 1 time a week with right hip pain     Time  2    Period  Weeks    Status  On-going      PT SHORT TERM GOAL #3   Title  PT to be able to sit for 30 minutes in comfort to be able to eat a meal without pain.     Time  3    Period  Weeks    Status   Achieved        PT Long Term Goals - 09/14/17 1031      PT LONG TERM GOAL #1   Title  Pt to be able to walk for 30 minutes with no assistive device and no pain to be able to feel confident returning to the [employer]    Time  4    Period  Weeks    Status  On-going      PT LONG TERM GOAL #2   Title  Pt to be able to sit for an hour without increased right hip pain to enjoy going out to eat.     Time  4    Period  Weeks    Status  Achieved      PT LONG TERM GOAL #3   Title  Pt rt hip pain to be no greater than a 2/10 to allow pt to sleep all night without walking from rt hip pain.     Time  4    Period  Weeks    Status  On-going      PT LONG TERM GOAL #4   Title  Pt to be able to single leg stance for 10 seconds on both LE to reduce risk of falls.     Time  4    Period  Weeks    Status  On-going            Plan - 09/19/17 1302    Clinical Impression Statement  continued with focus on improving glute and general LE strength.  Added lunges and sidelying clams to POC this session.  Noted weakness in Rt glute with actvities but improving overall.  Added clam with TB with bridge actvitiy, also with noted challenge.  Increased lateral step ups to 8" as well today.  Pt verbalizes significant improvement since beginning therapy.    Rehab Potential  Good    PT Frequency  3x / week    PT Duration  8 weeks    PT Treatment/Interventions  ADLs/Self Care Home Management;Therapeutic activities;Functional mobility training;Gait training;Stair  training;Therapeutic exercise;Balance training;Neuromuscular re-education;Patient/family education;Manual techniques;Dry needling    PT Next Visit Plan  contiue to progress functional strengthening  and balance as able.  Resume balance beam and and add step down strengthening next session.    PT Home Exercise Plan  Sitting:  sit to stand, LAQ, Ankle DF/PF, Supine:  SLR, bridging;  SL: hip abduction.; 12/26: SLS, tandem stance and sidestep with RTB     Consulted and Agree with Plan of Care  Patient       Patient will benefit from skilled therapeutic intervention in order to improve the following deficits and impairments:  Abnormal gait, Decreased activity tolerance, Decreased balance, Decreased strength, Difficulty walking, Pain, Impaired flexibility  Visit Diagnosis: Pain in right hip  Repeated falls  Muscle weakness (generalized)     Problem List Patient Active Problem List   Diagnosis Date Noted  . Dysphagia, pharyngeal 05/24/2017  . Hyperglycemia 10/12/2014  . Ganglion cyst 09/26/2013  . 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 Urge incontinence 03/25/2013  . Functional constipation 11/20/2012  . Non-ulcer dyspepsia 08/30/2010  . Hyperlipidemia 08/25/2010  . Essential hypertension 08/25/2010  . GERD 08/25/2010   Teena Irani, PTA/CLT [phone]  Teena Irani 09/19/2017, 1:06 PM  [facility] [place], [place], [address] Phone: [phone]   Fax:  [phone]  Name: Melissa Barajas MRN: [id number] Date of Birth: 1940-11-24

## 2017-09-21 ENCOUNTER — Ambulatory Visit (HOSPITAL_COMMUNITY): Payer: Medicare Other | Admitting: Physical Therapy

## 2017-09-21 ENCOUNTER — Encounter (HOSPITAL_COMMUNITY): Payer: Self-pay | Admitting: Physical Therapy

## 2017-09-21 DIAGNOSIS — M25551 Pain in right hip: Secondary | ICD-10-CM

## 2017-09-21 DIAGNOSIS — M6281 Muscle weakness (generalized): Secondary | ICD-10-CM | POA: Diagnosis not present

## 2017-09-21 DIAGNOSIS — R296 Repeated falls: Secondary | ICD-10-CM | POA: Diagnosis not present

## 2017-09-21 NOTE — Therapy (Signed)
Melissa Barajas, [place], [address] Phone: [phone]   Fax:  [phone]  Physical Therapy Treatment  Patient Details  Name: Melissa Barajas MRN: [id number] Date of Birth: [birth year] Referring Provider: Ola Spurr    Encounter Date: 09/21/2017  PT End of Session - 09/21/17 0914    Visit Number  13    Number of Visits  24    Date for PT Re-Evaluation  10/14/17    Authorization Type  Medicare    Authorization - Visit Number  13    Authorization - Number of Visits  24    PT Start Time  0900    PT Stop Time  0945    PT Time Calculation (min)  45 min    Equipment Utilized During Treatment  Gait belt    Activity Tolerance  Patient tolerated treatment well    Behavior During Therapy  [facility] for tasks assessed/performed       Past Medical History:  Diagnosis Date  . Arthritis   . Atrial fibrillation ([place])   . Collagen vascular disease ([place])   . DIVERTICULOSIS OF COLON 08/25/2010   Qualifier: Diagnosis of  By: Hoy Morn    . GERD (gastroesophageal reflux disease)   . HEMORRHOIDS, INTERNAL 08/25/2010   Qualifier: Diagnosis of  By: Hoy Morn    . Hypercholesteremia   . Hypertension   . Lipoma of arm    Left    Past Surgical History:  Procedure Laterality Date  . ABDOMINAL HYSTERECTOMY     Partial  . COLONOSCOPY  2009   SLF: 1. screening colonoscopy in 10 years 2. She should follow a high fiber diet She has been given a hand out on high fiber diverticulosois and hemorrhoids  . CYSTECTOMY     Left breast  . ESOPHAGOGASTRODUODENOSCOPY     [id number] appearing Schatzki's ring, possible cervical esophageal web, both dilated with passage of a Maloney dilator/small hiatal hernia  . ESOPHAGOGASTRODUODENOSCOPY N/A 10/25/2015   Procedure: ESOPHAGOGASTRODUODENOSCOPY (EGD);  Surgeon: Danie Binder, MD;  [facility];  Service: Endoscopy;  Laterality: N/A;  1215pm  . SAVORY DILATION N/A  10/25/2015   Procedure: SAVORY DILATION;  Surgeon: Danie Binder, MD;  [facility];  Service: Endoscopy;  Laterality: N/A;  . THYROIDECTOMY    . Tumor   05/2010   Left breast under nipple  . 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  Subjective Assessment - 09/21/17 0900    Subjective  Ms. [name] states that she has really improved since she first started coming.  She is able to do everything better and is not waking up as often at night.     Pertinent History  RA     How long can you sit comfortably?  no problem     How long can you stand comfortably?  15 minutes     How long can you walk comfortably?  30 minutes     Patient Stated Goals  less pain, to be able to stand and walk longer, to have better activity tolerance     Currently in Pain?  No/denies    Pain Onset  More than a month ago                      [facility] Adult PT Treatment/Exercise - 09/21/17 0001  Knee/Hip Exercises: Standing   Heel Raises  Both;20 reps    Heel Raises Limitations  toeraises 20 reps    Forward Lunges  Both;1 set;10 reps    Forward Lunges Limitations  onto bosu ball    Lateral Step Up  Right;Left;1 set;10 reps;Hand Hold: 1;Step Height: 8" Rt 6"    Forward Step Up  Right;Left;1 set;10 reps;Hand Hold: 1;Step Height: 8" Rt LE 4"    Step Down  Both;10 reps    Functional Squat  15 reps    Rocker Board  2 minutes;Limitations    SLS  x 5 B     SLS with Vectors  --    Other Standing Knee Exercises  hip abduction and extension with blue t-band x 10 each     Other Standing Knee Exercises  tadem on foam x 5       Knee/Hip Exercises: Seated   Sit to Sand  10 reps;without UE support      Knee/Hip Exercises: Supine   Bridges with Diona Foley Squeeze  --    Bridges with Clamshell  --    Single Leg Bridge  Both;5 reps      Knee/Hip Exercises: Sidelying   Clams  --      Knee/Hip Exercises: Prone   Hamstring Curl  15 reps     Hamstring Curl Limitations  5#    Hip Extension  1 set;15 reps               PT Short Term Goals - 09/14/17 1030      PT SHORT TERM GOAL #1   Title  Pt Rt hip pain to be no greater than a 5/10 to allow pt to walk for ten minutes.    Time  2    Period  Weeks    Status  On-going      PT SHORT TERM GOAL #2   Title  PT to only be waking up 1 time a week with right hip pain     Time  2    Period  Weeks    Status  On-going      PT SHORT TERM GOAL #3   Title  PT to be able to sit for 30 minutes in comfort to be able to eat a meal without pain.     Time  3    Period  Weeks    Status  Achieved        PT Long Term Goals - 09/14/17 1031      PT LONG TERM GOAL #1   Title  Pt to be able to walk for 30 minutes with no assistive device and no pain to be able to feel confident returning to the [employer]    Time  4    Period  Weeks    Status  On-going      PT LONG TERM GOAL #2   Title  Pt to be able to sit for an hour without increased right hip pain to enjoy going out to eat.     Time  4    Period  Weeks    Status  Achieved      PT LONG TERM GOAL #3   Title  Pt rt hip pain to be no greater than a 2/10 to allow pt to sleep all night without walking from rt hip pain.     Time  4    Period  Weeks    Status  On-going  PT LONG TERM GOAL #4   Title  Pt to be able to single leg stance for 10 seconds on both LE to reduce risk of falls.     Time  4    Period  Weeks    Status  On-going            Plan - 09/21/17 0915    Clinical Impression Statement  Attempted single heel raise but pt is unable to get to full ROM.  PT tends to ER RT LE when doing any functional task; she is able to selfcorrect but needs verbal cuing.  Step up without UE hold is significantly more difficult on pt Rt compared to left.  PT still has significant balance deficits .  PT will continue to benefit from skilled physical therapy .     Rehab Potential  Good    PT Frequency  3x / week    PT Duration   8 weeks    PT Treatment/Interventions  ADLs/Self Care Home Management;Therapeutic activities;Functional mobility training;Gait training;Stair training;Therapeutic exercise;Balance training;Neuromuscular re-education;Patient/family education;Manual techniques;Dry needling    PT Next Visit Plan  contiue to progress functional strengthening  and balance as able.      PT Home Exercise Plan  Sitting:  sit to stand, LAQ, Ankle DF/PF, Supine:  SLR, bridging;  SL: hip abduction.; 12/26: SLS, tandem stance and sidestep with RTB    Consulted and Agree with Plan of Care  Patient       Patient will benefit from skilled therapeutic intervention in order to improve the following deficits and impairments:  Abnormal gait, Decreased activity tolerance, Decreased balance, Decreased strength, Difficulty walking, Pain, Impaired flexibility  Visit Diagnosis: Pain in right hip  Repeated falls  Muscle weakness (generalized)     Problem List Patient Active Problem List   Diagnosis Date Noted  . Dysphagia, pharyngeal 05/24/2017  . Hyperglycemia 10/12/2014  . Ganglion cyst 09/26/2013  . 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 Urge incontinence 03/25/2013  . Functional constipation 11/20/2012  . Non-ulcer dyspepsia 08/30/2010  . Hyperlipidemia 08/25/2010  . Essential hypertension 08/25/2010  . GERD 08/25/2010  Rayetta Humphrey, PT CLT [phone] 09/21/2017, 9:47 AM  [facility] [address] [place], [place], [address] Phone: [phone]   Fax:  [phone]  Name: NEYAH ELLERMAN MRN: [id number] Date of Birth: [birth year]

## 2017-09-24 ENCOUNTER — Other Ambulatory Visit: Payer: Self-pay

## 2017-09-24 ENCOUNTER — Encounter (HOSPITAL_COMMUNITY): Payer: Self-pay

## 2017-09-24 ENCOUNTER — Ambulatory Visit (HOSPITAL_COMMUNITY): Payer: Medicare Other

## 2017-09-24 DIAGNOSIS — M6281 Muscle weakness (generalized): Secondary | ICD-10-CM | POA: Diagnosis not present

## 2017-09-24 DIAGNOSIS — M25551 Pain in right hip: Secondary | ICD-10-CM | POA: Diagnosis not present

## 2017-09-24 DIAGNOSIS — R296 Repeated falls: Secondary | ICD-10-CM

## 2017-09-24 NOTE — Therapy (Signed)
[facility] [address] [place], [place], [address] Phone: [phone]   Fax:  [phone]  Physical Therapy Treatment  Patient Details  Name: Melissa Barajas MRN: [id number] Date of Birth: [birth year] Referring Provider: Ola Spurr    Encounter Date: 09/24/2017  PT End of Session - 09/24/17 1406    Visit Number  14    Number of Visits  24    Date for PT Re-Evaluation  10/14/17    Authorization Type  Medicare    Authorization - Visit Number  14    Authorization - Number of Visits  24    PT Start Time  1351    PT Stop Time  1431    PT Time Calculation (min)  40 min    Equipment Utilized During Treatment  Gait belt    Activity Tolerance  Patient tolerated treatment well    Behavior During Therapy  [facility] for tasks assessed/performed       Past Medical History:  Diagnosis Date  . Arthritis   . Atrial fibrillation ([place])   . Collagen vascular disease ([place])   . DIVERTICULOSIS OF COLON 08/25/2010   Qualifier: Diagnosis of  By: Hoy Morn    . GERD (gastroesophageal reflux disease)   . HEMORRHOIDS, INTERNAL 08/25/2010   Qualifier: Diagnosis of  By: Hoy Morn    . Hypercholesteremia   . Hypertension   . Lipoma of arm    Left    Past Surgical History:  Procedure Laterality Date  . ABDOMINAL HYSTERECTOMY     Partial  . COLONOSCOPY  2009   SLF: 1. screening colonoscopy in 10 years 2. She should follow a high fiber diet She has been given a hand out on high fiber diverticulosois and hemorrhoids  . CYSTECTOMY     Left breast  . ESOPHAGOGASTRODUODENOSCOPY     [id number] appearing Schatzki's ring, possible cervical esophageal web, both dilated with passage of a Maloney dilator/small hiatal hernia  . ESOPHAGOGASTRODUODENOSCOPY N/A 10/25/2015   Procedure: ESOPHAGOGASTRODUODENOSCOPY (EGD);  Surgeon: Danie Binder, MD;  [facility];  Service: Endoscopy;  Laterality: N/A;  1215pm  . SAVORY DILATION N/A  10/25/2015   Procedure: SAVORY DILATION;  Surgeon: Danie Binder, MD;  [facility];  Service: Endoscopy;  Laterality: N/A;  . THYROIDECTOMY    . Tumor   05/2010   Left breast under nipple  . 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  Subjective Assessment - 09/24/17 1411    Subjective  States she is doing well, denies pain currently. She states her HEP is taking her about an hour to perform each day.    Pertinent History  RA     How long can you sit comfortably?  no problem     How long can you stand comfortably?  15 minutes     How long can you walk comfortably?  30 minutes     Patient Stated Goals  less pain, to be able to stand and walk longer, to have better activity tolerance     Currently in Pain?  No/denies       [facility] Adult PT Treatment/Exercise - 09/24/17 0001      Knee/Hip Exercises: Standing   Heel Raises  Both;20 reps    Lateral Step Up  Both;1 set;15 reps;Step Height: 6";Hand Hold: 0    Forward Step Up  Both;1 set;15 reps;Step  Height: 6";Hand Hold: 0    Rocker Board  2 minutes;Limitations    Rocker Board Limitations  Rt/Lt and A/P minimal HHA; 2x 1 minute each    Other Standing Knee Exercises  hip abduction and extension with red t-band; 2x 10 each       Knee/Hip Exercises: Supine   Bridges with Ball Squeeze  --    Single Leg Bridge  Both;10 reps;Strengthening;2 sets      Knee/Hip Exercises: Prone   Hamstring Curl  10 reps;2 sets    Hamstring Curl Limitations  BLE, red theraband        Balance Exercises - 09/24/17 1616      Balance Exercises: Standing   Marching Limitations  2x 1 minute, 3 second holds for SLS bilatearlly       PT Education - 09/24/17 1406    Education provided  Yes    Education Details  Educated on form/technique with exercises throughout session.    Person(s) Educated  Patient    Methods  Explanation    Comprehension  Verbalized understanding;Returned  demonstration       PT Short Term Goals - 09/14/17 1030      PT SHORT TERM GOAL #1   Title  Pt Rt hip pain to be no greater than a 5/10 to allow pt to walk for ten minutes.    Time  2    Period  Weeks    Status  On-going      PT SHORT TERM GOAL #2   Title  PT to only be waking up 1 time a week with right hip pain     Time  2    Period  Weeks    Status  On-going      PT SHORT TERM GOAL #3   Title  PT to be able to sit for 30 minutes in comfort to be able to eat a meal without pain.     Time  3    Period  Weeks    Status  Achieved        PT Long Term Goals - 09/14/17 1031      PT LONG TERM GOAL #1   Title  Pt to be able to walk for 30 minutes with no assistive device and no pain to be able to feel confident returning to the [employer]    Time  4    Period  Weeks    Status  On-going      PT LONG TERM GOAL #2   Title  Pt to be able to sit for an hour without increased right hip pain to enjoy going out to eat.     Time  4    Period  Weeks    Status  Achieved      PT LONG TERM GOAL #3   Title  Pt rt hip pain to be no greater than a 2/10 to allow pt to sleep all night without walking from rt hip pain.     Time  4    Period  Weeks    Status  On-going      PT LONG TERM GOAL #4   Title  Pt to be able to single leg stance for 10 seconds on both LE to reduce risk of falls.     Time  4    Period  Weeks    Status  On-going       Plan - 09/24/17 1407    Clinical Impression Statement  Patient is progressing in therapy and was able to advance functional strengthening with BLE today. She continues to have right lateral hip pain during exercises and requires breaks throughout session. She continues to require verbal cues to deter external rotation of right hip during functional strengthening. She will continues to benefit from skilled PT services to address strength and balance deficits to improve functional mobility, reduce pain, and improve QOL.     Rehab Potential  Good    PT  Frequency  3x / week    PT Duration  8 weeks    PT Treatment/Interventions  ADLs/Self Care Home Management;Therapeutic activities;Functional mobility training;Gait training;Stair training;Therapeutic exercise;Balance training;Neuromuscular re-education;Patient/family education;Manual techniques;Dry needling    PT Next Visit Plan  Advance singel limb stance balance activities and functional step ups. Assess glut med tendon on right hip and perform STW if appropriate.     PT Home Exercise Plan  Sitting:  sit to stand, LAQ, Ankle DF/PF, Supine:  SLR, bridging;  SL: hip abduction.; 12/26: SLS, tandem stance and sidestep with RTB. 09/24/17 - reviewed HEP and limited to sit to stand, side stepping, brdiges, SLS and tandem stance at counter.    Consulted and Agree with Plan of Care  Patient       Patient will benefit from skilled therapeutic intervention in order to improve the following deficits and impairments:  Abnormal gait, Decreased activity tolerance, Decreased balance, Decreased strength, Difficulty walking, Pain, Impaired flexibility  Visit Diagnosis: Pain in right hip  Repeated falls  Muscle weakness (generalized)     Problem List Patient Active Problem List   Diagnosis Date Noted  . Dysphagia, pharyngeal 05/24/2017  . Hyperglycemia 10/12/2014  . Ganglion cyst 09/26/2013  . 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 Urge incontinence 03/25/2013  . Functional constipation 11/20/2012  . Non-ulcer dyspepsia 08/30/2010  . Hyperlipidemia 08/25/2010  . Essential hypertension 08/25/2010  . GERD 08/25/2010    Kipp Brood, PT, DPT Physical Therapist with [facility]  09/24/2017 4:22 PM    [facility] [place], [place], [address] Phone: [phone]   Fax:  [phone]  Name: Melissa Barajas MRN: [id number] Date of Birth: 03-06-1941

## 2017-09-24 NOTE — Patient Instructions (Signed)
   TANDEM STANCE WITH SUPPORT: 2-3 times for 20-30 seconds  Stand in front of a chair, table or counter top for support. Then place the heel of one foot so that it is touching the toes of the other foot. Maintain your balance in this position.      SINGLE LEG STANCE - SLS: 2-3 times for 20-30 seconds  Stand on one leg and maintain your balance.     Side stepping with theraband  Place hands on kitchen counter for balance. Side step to the right 3-5 steps. Side step to the left 3-5 steps.  Repeat 2-3 sets until fatigued.  Sit down when taking band on/off     BRIDGING: 1-2 sets of 10-20 repetitions  While lying on your back with knees bent, tighten your lower abdominals, squeeze your buttocks and then raise your buttocks off the floor/bed as creating a "Bridge" with your body. Hold and then lower yourself and repeat.     SIT TO STAND - NO SUPPORT: 1-2 sets of 10 repetitions  Start by scooting close to the front of the chair.  Next, lean forward at your trunk and reach forward with your arms and rise to standing without using your hands to push off from the chair or other object.   Use your arms as a counter-balance by reaching forward when in sitting and lower them as you approach standing.

## 2017-09-26 ENCOUNTER — Ambulatory Visit (HOSPITAL_COMMUNITY): Payer: Medicare Other

## 2017-09-26 ENCOUNTER — Encounter (HOSPITAL_COMMUNITY): Payer: Self-pay

## 2017-09-26 DIAGNOSIS — M6281 Muscle weakness (generalized): Secondary | ICD-10-CM

## 2017-09-26 DIAGNOSIS — M25551 Pain in right hip: Secondary | ICD-10-CM

## 2017-09-26 DIAGNOSIS — R296 Repeated falls: Secondary | ICD-10-CM

## 2017-09-26 NOTE — Therapy (Signed)
[facility] [address] [place], [place], [address] Phone: [phone]   Fax:  [phone]  Physical Therapy Treatment  Patient Details  Name: Melissa Barajas MRN: [id number] Date of Birth: 1941-04-21 Referring Provider: Ola Spurr    Encounter Date: 09/26/2017  PT End of Session - 09/26/17 0902    Visit Number  15    Number of Visits  24    Date for PT Re-Evaluation  10/14/17    Authorization Type  Medicare    Authorization - Visit Number  15    Authorization - Number of Visits  24    PT Start Time  6962    PT Stop Time  0940    PT Time Calculation (min)  47 min    Equipment Utilized During Treatment  Gait belt    Activity Tolerance  Patient tolerated treatment well;Patient limited by fatigue    Behavior During Therapy  [facility] for tasks assessed/performed       Past Medical History:  Diagnosis Date  . Arthritis   . Atrial fibrillation ([place])   . Collagen vascular disease ([place])   . DIVERTICULOSIS OF COLON 08/25/2010   Qualifier: Diagnosis of  By: Hoy Morn    . GERD (gastroesophageal reflux disease)   . HEMORRHOIDS, INTERNAL 08/25/2010   Qualifier: Diagnosis of  By: Hoy Morn    . Hypercholesteremia   . Hypertension   . Lipoma of arm    Left    Past Surgical History:  Procedure Laterality Date  . ABDOMINAL HYSTERECTOMY     Partial  . COLONOSCOPY  2009   SLF: 1. screening colonoscopy in 10 years 2. She should follow a high fiber diet She has been given a hand out on high fiber diverticulosois and hemorrhoids  . CYSTECTOMY     Left breast  . ESOPHAGOGASTRODUODENOSCOPY     [id number] appearing Schatzki's ring, possible cervical esophageal web, both dilated with passage of a Maloney dilator/small hiatal hernia  . ESOPHAGOGASTRODUODENOSCOPY N/A 10/25/2015   Procedure: ESOPHAGOGASTRODUODENOSCOPY (EGD);  Surgeon: Danie Binder, MD;  [facility];  Service: Endoscopy;  Laterality: N/A;  1215pm  .  SAVORY DILATION N/A 10/25/2015   Procedure: SAVORY DILATION;  Surgeon: Danie Binder, MD;  [facility];  Service: Endoscopy;  Laterality: N/A;  . THYROIDECTOMY    . Tumor   05/2010   Left breast under nipple  . 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  Subjective Assessment - 09/26/17 0859    Subjective  Pt stated she is feeling good today, no reports of pain today.  Stated she is able to complete all house keeping activities, continues to be tired with vaccumming, sweeping/mopping no longer painful.      Patient Stated Goals  less pain, to be able to stand and walk longer, to have better activity tolerance     Currently in Pain?  No/denies                      [facility] Adult PT Treatment/Exercise - 09/26/17 0001      Balance Poses: Yoga   Warrior II  2 reps;30 seconds      Knee/Hip Exercises: Standing   Heel Raises  Both;20 reps    Heel Raises Limitations  toeraises 20 reps    Forward Lunges  Both;15 reps    Forward Lunges Limitations  onto floor no HHA    Lateral Step Up  Both;15 reps;Hand Hold: 0;Step Height: 6"    Forward Step Up  Both;1 set;15 reps;Step Height: 6";Hand Hold: 0    Step Down  Both;Step Height: 6";10 reps;Hand Hold: 1    Functional Squat  15 reps    Functional Squat Limitations  proper lifting yellow ball from 12in step    Other Standing Knee Exercises  cone taps 10x BLE      Knee/Hip Exercises: Supine   Single Leg Bridge  Both;10 reps;Strengthening;2 sets      Knee/Hip Exercises: Prone   Hamstring Curl  15 reps    Hamstring Curl Limitations  5#    Other Prone Exercises  quadruped hip extension 10x each               PT Short Term Goals - 09/14/17 1030      PT SHORT TERM GOAL #1   Title  Pt Rt hip pain to be no greater than a 5/10 to allow pt to walk for ten minutes.    Time  2    Period  Weeks    Status  On-going      PT SHORT TERM GOAL #2   Title  PT to  only be waking up 1 time a week with right hip pain     Time  2    Period  Weeks    Status  On-going      PT SHORT TERM GOAL #3   Title  PT to be able to sit for 30 minutes in comfort to be able to eat a meal without pain.     Time  3    Period  Weeks    Status  Achieved        PT Long Term Goals - 09/14/17 1031      PT LONG TERM GOAL #1   Title  Pt to be able to walk for 30 minutes with no assistive device and no pain to be able to feel confident returning to [facility] [employer]    Time  4    Period  Weeks    Status  On-going      PT LONG TERM GOAL #2   Title  Pt to be able to sit for an hour without increased right hip pain to enjoy going out to eat.     Time  4    Period  Weeks    Status  Achieved      PT LONG TERM GOAL #3   Title  Pt rt hip pain to be no greater than a 2/10 to allow pt to sleep all night without walking from rt hip pain.     Time  4    Period  Weeks    Status  On-going      PT LONG TERM GOAL #4   Title  Pt to be able to single leg stance for 10 seconds on both LE to reduce risk of falls.     Time  4    Period  Weeks    Status  On-going            Plan - 09/26/17 0935    Clinical Impression Statement  Pt progressing well in therapy.  Continued session focus with proximal gluteal strengthening, functional strengthening and progressed balance today.  Pt able to complete whole session with no c/o pain, does continue to experience muscle fatigue with task.  Progressed to quadruped  for gluteal strengthening and warrior poses for balance training.  No reports of pain, was limited by fatigue.  Min A required for safety with balance activities, does continue to demonstrate deficits with gluteal weakness and balalnce.    Rehab Potential  Good    PT Frequency  3x / week    PT Duration  8 weeks    PT Treatment/Interventions  ADLs/Self Care Home Management;Therapeutic activities;Functional mobility training;Gait training;Stair training;Therapeutic exercise;Balance  training;Neuromuscular re-education;Patient/family education;Manual techniques;Dry needling    PT Next Visit Plan  Advance singel limb stance balance activities and functional step ups. Assess glut med tendon on right hip and perform STW if appropriate.     PT Home Exercise Plan  Sitting:  sit to stand, LAQ, Ankle DF/PF, Supine:  SLR, bridging;  SL: hip abduction.; 12/26: SLS, tandem stance and sidestep with RTB. 09/24/17 - reviewed HEP and limited to sit to stand, side stepping, brdiges, SLS and tandem stance at counter.       Patient will benefit from skilled therapeutic intervention in order to improve the following deficits and impairments:  Abnormal gait, Decreased activity tolerance, Decreased balance, Decreased strength, Difficulty walking, Pain, Impaired flexibility  Visit Diagnosis: Pain in right hip  Repeated falls  Muscle weakness (generalized)     Problem List Patient Active Problem List   Diagnosis Date Noted  . Dysphagia, pharyngeal 05/24/2017  . Hyperglycemia 10/12/2014  . Ganglion cyst 09/26/2013  . 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 Urge incontinence 03/25/2013  . Functional constipation 11/20/2012  . Non-ulcer dyspepsia 08/30/2010  . Hyperlipidemia 08/25/2010  . Essential hypertension 08/25/2010  . GERD 08/25/2010   Ihor [place], [place]; [place]  Aldona Lento 09/26/2017, 12:41 PM  [place], [place], [address] Phone: [phone]   Fax:  [phone]  Name: Melissa Barajas MRN: [id number] Date of Birth: 1941-01-12

## 2017-09-28 ENCOUNTER — Other Ambulatory Visit: Payer: Self-pay

## 2017-09-28 ENCOUNTER — Encounter (HOSPITAL_COMMUNITY): Payer: Self-pay | Admitting: Physical Therapy

## 2017-09-28 ENCOUNTER — Ambulatory Visit (HOSPITAL_COMMUNITY): Payer: Medicare Other | Admitting: Physical Therapy

## 2017-09-28 DIAGNOSIS — M6281 Muscle weakness (generalized): Secondary | ICD-10-CM | POA: Diagnosis not present

## 2017-09-28 DIAGNOSIS — M25551 Pain in right hip: Secondary | ICD-10-CM

## 2017-09-28 DIAGNOSIS — R296 Repeated falls: Secondary | ICD-10-CM | POA: Diagnosis not present

## 2017-09-28 NOTE — Therapy (Signed)
[place], [place], [address] Phone: [phone]   Fax:  [phone]  Physical Therapy Treatment  Patient Details  Name: Melissa Barajas MRN: [id number] Date of Birth: [birth year] Referring Provider: Ola Spurr    Encounter Date: 09/28/2017  PT End of Session - 09/28/17 1115    Visit Number  16    Number of Visits  24    Date for PT Re-Evaluation  10/14/17    Authorization Type  Medicare    Authorization - Visit Number  16    Authorization - Number of Visits  24    PT Start Time  0904    PT Stop Time  0945    PT Time Calculation (min)  41 min    Activity Tolerance  Patient tolerated treatment well;Patient limited by fatigue    Behavior During Therapy  [facility] for tasks assessed/performed       Past Medical History:  Diagnosis Date  . Arthritis   . Atrial fibrillation ([place])   . Collagen vascular disease ([place])   . DIVERTICULOSIS OF COLON 08/25/2010   Qualifier: Diagnosis of  By: Hoy Morn    . GERD (gastroesophageal reflux disease)   . HEMORRHOIDS, INTERNAL 08/25/2010   Qualifier: Diagnosis of  By: Hoy Morn    . Hypercholesteremia   . Hypertension   . Lipoma of arm    Left    Past Surgical History:  Procedure Laterality Date  . ABDOMINAL HYSTERECTOMY     Partial  . COLONOSCOPY  2009   SLF: 1. screening colonoscopy in 10 years 2. She should follow a high fiber diet She has been given a hand out on high fiber diverticulosois and hemorrhoids  . CYSTECTOMY     Left breast  . ESOPHAGOGASTRODUODENOSCOPY     CVE:[id number] appearing Schatzki's ring, possible cervical esophageal web, both dilated with passage of a Maloney dilator/small hiatal hernia  . ESOPHAGOGASTRODUODENOSCOPY N/A 10/25/2015   Procedure: ESOPHAGOGASTRODUODENOSCOPY (EGD);  Surgeon: Danie Binder, MD;  [facility];  Service: Endoscopy;  Laterality: N/A;  1215pm  . SAVORY DILATION N/A 10/25/2015   Procedure: SAVORY  DILATION;  Surgeon: Danie Binder, MD;  [facility];  Service: Endoscopy;  Laterality: N/A;  . THYROIDECTOMY    . Tumor   05/2010   Left breast under nipple  . 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  Subjective Assessment - 09/28/17 0909    Subjective  Patient stated she is doing well today and denies pain. She stated that she is still having difficulty with balancing on 1 foot.     Pertinent History  RA     Limitations  Sitting;Lifting;Standing;Walking    How long can you sit comfortably?  no problem     How long can you stand comfortably?  15 minutes     How long can you walk comfortably?  30 minutes     Patient Stated Goals  less pain, to be able to stand and walk longer, to have better activity tolerance     Pain Score  0-No pain    Multiple Pain Sites  No                      [facility] Adult PT Treatment/Exercise - 09/28/17 0001      Balance Poses: Yoga   Warrior II  2 reps;30  seconds Both lower extremities      Knee/Hip Exercises: Standing   Heel Raises  Both;20 reps bilateral upper extremity support    Heel Raises Limitations  toeraises 20 reps Bilateral upper extremity support    Forward Lunges  Both;15 reps    Forward Lunges Limitations  onto floor no HHA; more     Lateral Step Up  Both;Hand Hold: 0;Step Height: 6";10 reps Mirror for visual cues on proper technique    Forward Step Up  Both;1 set;15 reps;Step Height: 6";Hand Hold: 0    Step Down  Both;Step Height: 6";10 reps;Hand Hold: 1 Cue to lower down slowly    Functional Squat  15 reps    Rocker Board  2 minutes;Limitations    Rocker Board Limitations  Rt/Lt and A/P 2 hand hold assist; 2x 1 minute each      Knee/Hip Exercises: Supine   Single Leg Bridge  Both;10 reps;Strengthening;2 sets      Knee/Hip Exercises: Prone   Hamstring Curl  15 reps    Hamstring Curl Limitations  5# each lower extremity    Hip Extension  1 set;15  reps;Left;Right             PT Education - 09/28/17 1113    Education provided  Yes    Education Details  Patient was educated on the technique and purpose of exercises throughout the session.     Person(s) Educated  Patient    Methods  Explanation    Comprehension  Verbalized understanding;Returned demonstration       PT Short Term Goals - 09/14/17 1030      PT SHORT TERM GOAL #1   Title  Pt Rt hip pain to be no greater than a 5/10 to allow pt to walk for ten minutes.    Time  2    Period  Weeks    Status  On-going      PT SHORT TERM GOAL #2   Title  PT to only be waking up 1 time a week with right hip pain     Time  2    Period  Weeks    Status  On-going      PT SHORT TERM GOAL #3   Title  PT to be able to sit for 30 minutes in comfort to be able to eat a meal without pain.     Time  3    Period  Weeks    Status  Achieved        PT Long Term Goals - 09/14/17 1031      PT LONG TERM GOAL #1   Title  Pt to be able to walk for 30 minutes with no assistive device and no pain to be able to feel confident returning to the [employer]    Time  4    Period  Weeks    Status  On-going      PT LONG TERM GOAL #2   Title  Pt to be able to sit for an hour without increased right hip pain to enjoy going out to eat.     Time  4    Period  Weeks    Status  Achieved      PT LONG TERM GOAL #3   Title  Pt rt hip pain to be no greater than a 2/10 to allow pt to sleep all night without walking from rt hip pain.     Time  4    Period  Weeks    Status  On-going      PT LONG TERM GOAL #4   Title  Pt to be able to single leg stance for 10 seconds on both LE to reduce risk of falls.     Time  4    Period  Weeks    Status  On-going            Plan - 09/28/17 1117    Clinical Impression Statement  This session focused initially on supine and prone strengthening exercises for the lower extremities, which patient tolerated well. Then therapist assessed pain in right hip with  palpation and with rotation of right lower extremity into internal and external rotation. Patient denied pain throughout palpation and with rotation, so did not perform manual therapy this session. Then session progressed to performing standing and more functional strengthening exercises for lower extremities. Patient stated that she feels like she is getting stronger and better with these. Patient required visual cueing with mirror to perform lateral step ups properly and noted patient had more wobbliness in lower extremities when right lower extremity was forward on the warrior pose. Patient would continue to benefit from skilled physical therapy in order to address continued deficits in functional strength, balance, and overall functional mobility.     Rehab Potential  Good    PT Frequency  3x / week    PT Duration  8 weeks    PT Treatment/Interventions  ADLs/Self Care Home Management;Therapeutic activities;Functional mobility training;Gait training;Stair training;Therapeutic exercise;Balance training;Neuromuscular re-education;Patient/family education;Manual techniques;Dry needling    PT Next Visit Plan  Advance singel limb stance balance activities, glut med strengthening, and functional step ups. Continue to assess glut med tendon on right hip and perform STW if appropriate.     PT Home Exercise Plan  Sitting:  sit to stand, LAQ, Ankle DF/PF, Supine:  SLR, bridging;  SL: hip abduction.; 12/26: SLS, tandem stance and sidestep with RTB. 09/24/17 - reviewed HEP and limited to sit to stand, side stepping, brdiges, SLS and tandem stance at counter.    Consulted and Agree with Plan of Care  Patient       Patient will benefit from skilled therapeutic intervention in order to improve the following deficits and impairments:  Abnormal gait, Decreased activity tolerance, Decreased balance, Decreased strength, Difficulty walking, Pain, Impaired flexibility  Visit Diagnosis: Pain in right hip  Repeated  falls  Muscle weakness (generalized)     Problem List Patient Active Problem List   Diagnosis Date Noted  . Dysphagia, pharyngeal 05/24/2017  . Hyperglycemia 10/12/2014  . Ganglion cyst 09/26/2013  . 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 Urge incontinence 03/25/2013  . Functional constipation 11/20/2012  . Non-ulcer dyspepsia 08/30/2010  . Hyperlipidemia 08/25/2010  . Essential hypertension 08/25/2010  . GERD 08/25/2010    Clarene Critchley PT, DPT 12:28 PM, 09/28/17 [place], [place], [address] Phone: [phone]   Fax:  [phone]  Name: Melissa Barajas MRN: [id number] Date of Birth: 01/01/1941

## 2017-10-02 ENCOUNTER — Ambulatory Visit (HOSPITAL_COMMUNITY): Payer: Medicare Other | Admitting: Physical Therapy

## 2017-10-02 ENCOUNTER — Encounter (HOSPITAL_COMMUNITY): Payer: Self-pay | Admitting: Physical Therapy

## 2017-10-02 DIAGNOSIS — R296 Repeated falls: Secondary | ICD-10-CM

## 2017-10-02 DIAGNOSIS — M25551 Pain in right hip: Secondary | ICD-10-CM

## 2017-10-02 DIAGNOSIS — M6281 Muscle weakness (generalized): Secondary | ICD-10-CM | POA: Diagnosis not present

## 2017-10-02 NOTE — Therapy (Signed)
[place], [place], [address] Phone: [phone]   Fax:  [phone]  Physical Therapy Treatment  Patient Details  Name: Melissa Barajas MRN: [id number] Date of Birth: Apr 10, 1941 Referring Provider: Ola Spurr    Encounter Date: 10/02/2017  PT End of Session - 10/02/17 0927    Visit Number  17    Number of Visits  24    Date for PT Re-Evaluation  10/14/17    Authorization Type  Medicare    Authorization - Visit Number  79    Authorization - Number of Visits  24    PT Start Time  0932    PT Stop Time  0935    PT Time Calculation (min)  42 min    Activity Tolerance  Patient tolerated treatment well;Patient limited by fatigue    Behavior During Therapy  [facility] for tasks assessed/performed       Past Medical History:  Diagnosis Date  . Arthritis   . Atrial fibrillation ([place])   . Collagen vascular disease ([place])   . DIVERTICULOSIS OF COLON 08/25/2010   Qualifier: Diagnosis of  By: Hoy Morn    . GERD (gastroesophageal reflux disease)   . HEMORRHOIDS, INTERNAL 08/25/2010   Qualifier: Diagnosis of  By: Hoy Morn    . Hypercholesteremia   . Hypertension   . Lipoma of arm    Left    Past Surgical History:  Procedure Laterality Date  . ABDOMINAL HYSTERECTOMY     Partial  . COLONOSCOPY  2009   SLF: 1. screening colonoscopy in 10 years 2. She should follow a high fiber diet She has been given a hand out on high fiber diverticulosois and hemorrhoids  . CYSTECTOMY     Left breast  . ESOPHAGOGASTRODUODENOSCOPY     [id number] appearing Schatzki's ring, possible cervical esophageal web, both dilated with passage of a Maloney dilator/small hiatal hernia  . ESOPHAGOGASTRODUODENOSCOPY N/A 10/25/2015   Procedure: ESOPHAGOGASTRODUODENOSCOPY (EGD);  Surgeon: Danie Binder, MD;  [facility];  Service: Endoscopy;  Laterality: N/A;  1215pm  . SAVORY DILATION N/A 10/25/2015   Procedure: SAVORY  DILATION;  Surgeon: Danie Binder, MD;  [facility];  Service: Endoscopy;  Laterality: N/A;  . THYROIDECTOMY    . Tumor   05/2010   Left breast under nipple  . 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  Subjective Assessment - 10/02/17 0852    Subjective  PT states that her hip is feeling a lot better at this point.  Her main concern at this time is her balance but she is working on it at home.     Pertinent History  RA     Limitations  Sitting;Lifting;Standing;Walking    How long can you sit comfortably?  no problem     How long can you stand comfortably?  15 minutes     How long can you walk comfortably?  30 minutes     Patient Stated Goals  less pain, to be able to stand and walk longer, to have better activity tolerance     Currently in Pain?  No/denies                      [facility] Adult PT Treatment/Exercise - 10/02/17 0001      Balance Poses: Yoga   Warrior I  2 reps;30  seconds    Warrior II  2 reps;30 seconds Both lower extremities      Knee/Hip Exercises: Standing   Heel Raises  Both;15 reps to squat    Forward Lunges  Both;10 reps;15 reps    Forward Lunges Limitations  [place]    Rocker Board  2 minutes;Limitations    Rocker Board Limitations  Rt/Lt and A/P 2 hand hold assist; 2x 1 minute each    SLS  x5      Knee/Hip Exercises: Supine   Single Leg Bridge  Strengthening;Both;10 reps      Knee/Hip Exercises: Sidelying   Hip ABduction  Strengthening;Both;10 reps;Other (comment)    Hip ABduction Limitations  3#      Knee/Hip Exercises: Prone   Hamstring Curl  15 reps    Hamstring Curl Limitations  5# each lower extremity    Hip Extension  1 set;15 reps;Left;Right               PT Short Term Goals - 09/14/17 1030      PT SHORT TERM GOAL #1   Title  Pt Rt hip pain to be no greater than a 5/10 to allow pt to walk for ten minutes.    Time  2    Period  Weeks    Status   On-going      PT SHORT TERM GOAL #2   Title  PT to only be waking up 1 time a week with right hip pain     Time  2    Period  Weeks    Status  On-going      PT SHORT TERM GOAL #3   Title  PT to be able to sit for 30 minutes in comfort to be able to eat a meal without pain.     Time  3    Period  Weeks    Status  Achieved        PT Long Term Goals - 09/14/17 1031      PT LONG TERM GOAL #1   Title  Pt to be able to walk for 30 minutes with no assistive device and no pain to be able to feel confident returning to the [employer]    Time  4    Period  Weeks    Status  On-going      PT LONG TERM GOAL #2   Title  Pt to be able to sit for an hour without increased right hip pain to enjoy going out to eat.     Time  4    Period  Weeks    Status  Achieved      PT LONG TERM GOAL #3   Title  Pt rt hip pain to be no greater than a 2/10 to allow pt to sleep all night without walking from rt hip pain.     Time  4    Period  Weeks    Status  On-going      PT LONG TERM GOAL #4   Title  Pt to be able to single leg stance for 10 seconds on both LE to reduce risk of falls.     Time  4    Period  Weeks    Status  On-going            Plan - 10/02/17 0928    Clinical Impression Statement  Added Warrior I to regieme.  After completeing standing exercises that were performed pt complained of being  dizzy.  Pt had not ate any breakfast.  Therapist gave pt water and a mint and pt felt better.  Completed the rest of the session with open chained exercises.  Therapist encouraged patient to eat prior to coming to therapy; pt verbalized understanding.     Rehab Potential  Good    PT Frequency  3x / week    PT Duration  8 weeks    PT Treatment/Interventions  ADLs/Self Care Home Management;Therapeutic activities;Functional mobility training;Gait training;Stair training;Therapeutic exercise;Balance training;Neuromuscular re-education;Patient/family education;Manual techniques;Dry needling    PT Next  Visit Plan  Return to advance single leg activity.     PT Home Exercise Plan  Sitting:  sit to stand, LAQ, Ankle DF/PF, Supine:  SLR, bridging;  SL: hip abduction.; 12/26: SLS, tandem stance and sidestep with RTB. 09/24/17 - reviewed HEP and limited to sit to stand, side stepping, brdiges, SLS and tandem stance at counter.    Consulted and Agree with Plan of Care  Patient       Patient will benefit from skilled therapeutic intervention in order to improve the following deficits and impairments:  Abnormal gait, Decreased activity tolerance, Decreased balance, Decreased strength, Difficulty walking, Pain, Impaired flexibility  Visit Diagnosis: Pain in right hip  Repeated falls  Muscle weakness (generalized)     Problem List Patient Active Problem List   Diagnosis Date Noted  . Dysphagia, pharyngeal 05/24/2017  . Hyperglycemia 10/12/2014  . Ganglion cyst 09/26/2013  . 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 Urge incontinence 03/25/2013  . Functional constipation 11/20/2012  . Non-ulcer dyspepsia 08/30/2010  . Hyperlipidemia 08/25/2010  . Essential hypertension 08/25/2010  . GERD 08/25/2010   Rayetta Humphrey, PT CLT [phone] 10/02/2017, 9:40 AM  [place] [address] [place], [place], [address] Phone: [phone]   Fax:  [phone]  Name: HIEN CUNLIFFE MRN: [id number] Date of Birth: 1940/12/19

## 2017-10-03 ENCOUNTER — Encounter (HOSPITAL_COMMUNITY): Payer: Self-pay | Admitting: Physical Therapy

## 2017-10-03 ENCOUNTER — Encounter: Payer: Self-pay | Admitting: Gastroenterology

## 2017-10-03 ENCOUNTER — Ambulatory Visit (HOSPITAL_COMMUNITY): Payer: Medicare Other | Admitting: Physical Therapy

## 2017-10-03 DIAGNOSIS — M6281 Muscle weakness (generalized): Secondary | ICD-10-CM

## 2017-10-03 DIAGNOSIS — R296 Repeated falls: Secondary | ICD-10-CM | POA: Diagnosis not present

## 2017-10-03 DIAGNOSIS — M25551 Pain in right hip: Secondary | ICD-10-CM

## 2017-10-03 NOTE — Therapy (Signed)
[place], [place], [address] Phone: [phone]   Fax:  [phone]  Physical Therapy Treatment  Patient Details  Name: Melissa Barajas MRN: [id number] Date of Birth: [birth year] Referring Provider: Ola Spurr    Encounter Date: 10/03/2017  PT End of Session - 10/03/17 0934    Visit Number  18    Number of Visits  24    Date for PT Re-Evaluation  10/14/17    Authorization Type  Medicare    Authorization - Visit Number  18    Authorization - Number of Visits  24    PT Start Time  0901    PT Stop Time  0947    PT Time Calculation (min)  46 min    Activity Tolerance  Patient tolerated treatment well;Patient limited by fatigue    Behavior During Therapy  [facility] for tasks assessed/performed       Past Medical History:  Diagnosis Date  . Arthritis   . Atrial fibrillation ([place])   . Collagen vascular disease ([place])   . DIVERTICULOSIS OF COLON 08/25/2010   Qualifier: Diagnosis of  By: Hoy Morn    . GERD (gastroesophageal reflux disease)   . HEMORRHOIDS, INTERNAL 08/25/2010   Qualifier: Diagnosis of  By: Hoy Morn    . Hypercholesteremia   . Hypertension   . Lipoma of arm    Left    Past Surgical History:  Procedure Laterality Date  . ABDOMINAL HYSTERECTOMY     Partial  . COLONOSCOPY  2009   SLF: 1. screening colonoscopy in 10 years 2. She should follow a high fiber diet She has been given a hand out on high fiber diverticulosois and hemorrhoids  . CYSTECTOMY     Left breast  . ESOPHAGOGASTRODUODENOSCOPY     PPI:RJJOACZYSAY appearing Schatzki's ring, possible cervical esophageal web, both dilated with passage of a Maloney dilator/small hiatal hernia  . ESOPHAGOGASTRODUODENOSCOPY N/A 10/25/2015   Procedure: ESOPHAGOGASTRODUODENOSCOPY (EGD);  Surgeon: Danie Binder, MD;  [facility];  Service: Endoscopy;  Laterality: N/A;  1215pm  . SAVORY DILATION N/A 10/25/2015   Procedure: SAVORY  DILATION;  Surgeon: Danie Binder, MD;  [facility];  Service: Endoscopy;  Laterality: N/A;  . THYROIDECTOMY    . Tumor   05/2010   Left breast under nipple  . 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  Subjective Assessment - 10/03/17 0901    Subjective  PT states that her hip is feeling a lot better at this point.  Her main concern at this time is her balance but she is working on it at home.     Pertinent History  RA     Limitations  Sitting;Lifting;Standing;Walking    How long can you sit comfortably?  no problem     How long can you stand comfortably?  15 minutes     How long can you walk comfortably?  30 minutes     Patient Stated Goals  less pain, to be able to stand and walk longer, to have better activity tolerance     Pain Score  0-No pain         [facility] PT Assessment - 10/03/17 0001      Assessment   Medical Diagnosis  Rt trochanteric bursitis  (Pended)     Onset Date/Surgical Date  --  (Pended)  unknown  Next MD Visit  not scheduled   (Pended)     Prior Therapy  none  (Pended)       Precautions   Precautions  None  (Pended)       Restrictions   Weight Bearing Restrictions  No  (Pended)       Home Environment   Living Environment  Private residence  ([place])     Home Access  Stairs to enter  (Pended)     Entrance Stairs-Number of Steps  2  (Pended)     Home Layout  Laundry or work area in basement  [employer])       Prior Function   Level of Independence  Independent  (Pended)     Vocation  Retired  Museum/gallery conservator)     Leisure  [employer] but not doing at this time due to her hip pain ; going to bible study which requires a lot of walking and stair climbing   (Pended)       Cognition   Overall Cognitive Status  Within Functional Limits for tasks assessed  (Pended)       Observation/Other Assessments   Focus on Therapeutic Outcomes (FOTO)   48 was 45  (Pended)       Functional Tests   Functional  tests  Single leg stance;Sit to Stand  (Pended)       Single Leg Stance   Comments  Rt:  8 section was 1 second;  LT 8 was 1 second   (Pended)       Sit to Stand   Comments  5x13.13 was 25.95  (Pended)       Strength   Right Hip Flexion  3/5  (Pended)  was 2+/5     Right Hip Extension  3-/5  (Pended)  was 2/5    Right Hip ABduction  3-/5  (Pended)  was 2+    Left Hip Flexion  4/5  (Pended)  was 3+/5     Left Hip Extension  4-/5  (Pended)  was 3/5     Left Hip ABduction  3-/5  (Pended)     Right Knee Flexion  4-/5  (Pended)  was 3+    Right Knee Extension  3/5  (Pended)     Left Knee Flexion  5/5  (Pended)  was 4/5     Left Knee Extension  3/5  (Pended)     Right Ankle Dorsiflexion  5/5  (Pended)  was 3+/5     Right Ankle Plantar Flexion  3+/5  (Pended)  was 3+/5    Left Ankle Dorsiflexion  4+/5  (Pended)  was 3+/5    Left Ankle Plantar Flexion  3+/5  (Pended)  was 3+/5                  [facility] Adult PT Treatment/Exercise - 10/03/17 0001      Balance Poses: Yoga   Warrior I  2 reps;30 seconds    Warrior II  2 reps;30 seconds Both lower extremities      Knee/Hip Exercises: Standing   Heel Raises  Both;15 reps to squat    Forward Lunges  Both;10 reps;15 reps    Forward Lunges Limitations  Bosu    Lateral Step Up  Both;10 reps;Hand Hold: 0;Step Height: 6" Mirror for visual cues on proper technique    Forward Step Up  Both;1 set;10 reps;Hand Hold: 0;Step Height: 6"    Stairs  2 RT reciprocal with 1 hand hold  Rocker Board  2 minutes;Limitations    Rocker Board Limitations  Rt/Lt and A/P 2 hand hold assist; 2x 1 minute each    SLS  x5      Knee/Hip Exercises: Supine   Single Leg Bridge  Strengthening;Both;10 reps 5 sec hold      Knee/Hip Exercises: Sidelying   Hip ABduction  Strengthening;Both;10 reps;Other (comment)    Hip ABduction Limitations  3#      Knee/Hip Exercises: Prone   Hamstring Curl  15 reps    Hamstring Curl Limitations  5# each lower extremity     Hip Extension  1 set;15 reps;Left;Right             PT Education - 10/03/17 0934    Education provided  Yes    Person(s) Educated  Patient    Methods  Explanation    Comprehension  Returned demonstration       PT Short Term Goals - 10/03/17 0955      PT SHORT TERM GOAL #1   Title  Pt Rt hip pain to be no greater than a 5/10 to allow pt to walk for ten minutes.    Time  2    Period  Weeks    Status  Achieved      PT SHORT TERM GOAL #2   Title  PT to only be waking up 1 time a week with right hip pain     Time  2    Period  Weeks    Status  On-going      PT SHORT TERM GOAL #3   Title  PT to be able to sit for 30 minutes in comfort to be able to eat a meal without pain.     Time  3    Period  Weeks    Status  Achieved        PT Long Term Goals - 10/03/17 0955      PT LONG TERM GOAL #1   Title  Pt to be able to walk for 30 minutes with no assistive device and no pain to be able to feel confident returning to the [employer]    Time  4    Period  Weeks    Status  Partially Met      PT LONG TERM GOAL #2   Title  Pt to be able to sit for an hour without increased right hip pain to enjoy going out to eat.     Time  4    Period  Weeks    Status  Achieved      PT LONG TERM GOAL #3   Title  Pt rt hip pain to be no greater than a 2/10 to allow pt to sleep all night without walking from rt hip pain.     Time  4    Period  Weeks    Status  Partially Met      PT LONG TERM GOAL #4   Title  Pt to be able to single leg stance for 10 seconds on both LE to reduce risk of falls.     Time  4    Period  Weeks    Status  Partially Met            Plan - 10/03/17 0935    Clinical Impression Statement  Patient presenting with no reports of pain for the last 3 weeks, however has not really attempted walking as a challenge. Pt to attempt 30  minutes of walking and report on pain response. Pt performed exercises well. RLE exhibits continued need for strengthening; no dizziness  today. Pt has continued decrease in activity level due to fear of pain.Pt able to balance on Lt leg for 9 sec and Rt for 10 x1 each.    Rehab Potential  Good    PT Frequency  3x / week    PT Duration  8 weeks    PT Treatment/Interventions  ADLs/Self Care Home Management;Therapeutic activities;Functional mobility training;Gait training;Stair training;Therapeutic exercise;Balance training;Neuromuscular re-education;Patient/family education;Manual techniques;Dry needling    PT Next Visit Plan  Return to advance single leg activity, possibly balance beam, walking lunges.    PT Home Exercise Plan  Sitting:  sit to stand, LAQ, Ankle DF/PF, Supine:  SLR, bridging;  SL: hip abduction.; 12/26: SLS, tandem stance and sidestep with RTB. 09/24/17 - reviewed HEP and limited to sit to stand, side stepping, brdiges, SLS and tandem stance at counter.    Consulted and Agree with Plan of Care  Patient       Patient will benefit from skilled therapeutic intervention in order to improve the following deficits and impairments:  Abnormal gait, Decreased activity tolerance, Decreased balance, Decreased strength, Difficulty walking, Pain, Impaired flexibility  Visit Diagnosis: Pain in right hip  Repeated falls  Muscle weakness (generalized)     Problem List Patient Active Problem List   Diagnosis Date Noted  . Dysphagia, pharyngeal 05/24/2017  . Hyperglycemia 10/12/2014  . Ganglion cyst 09/26/2013  . 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 Urge incontinence 03/25/2013  . Functional constipation 11/20/2012  . Non-ulcer dyspepsia 08/30/2010  . Hyperlipidemia 08/25/2010  . Essential hypertension 08/25/2010  . GERD 08/25/2010    Rayetta Humphrey, PT CLT [phone] 10/03/2017, 9:58 AM  [place] [address] [place], [place], [address] Phone: [phone]   Fax:  [phone]  Name: Melissa Barajas MRN: [id number] Date of Birth:  08-12-1941

## 2017-10-05 ENCOUNTER — Ambulatory Visit (HOSPITAL_COMMUNITY): Payer: Medicare Other

## 2017-10-05 ENCOUNTER — Encounter (HOSPITAL_COMMUNITY): Payer: Self-pay

## 2017-10-05 ENCOUNTER — Other Ambulatory Visit: Payer: Self-pay

## 2017-10-05 DIAGNOSIS — M25551 Pain in right hip: Secondary | ICD-10-CM

## 2017-10-05 DIAGNOSIS — M6281 Muscle weakness (generalized): Secondary | ICD-10-CM

## 2017-10-05 DIAGNOSIS — R296 Repeated falls: Secondary | ICD-10-CM

## 2017-10-05 NOTE — Therapy (Signed)
[place], [place], [address] Phone: [phone]   Fax:  [phone]  Physical Therapy Treatment  Patient Details  Name: Melissa Barajas MRN: [id number] Date of Birth: 06-04-1941 Referring Provider: Ola Spurr    Encounter Date: 10/05/2017  PT End of Session - 10/05/17 0913    Visit Number  19    Number of Visits  24    Date for PT Re-Evaluation  10/14/17    Authorization Type  Medicare    Authorization - Visit Number  83    Authorization - Number of Visits  24    PT Start Time  0901    PT Stop Time  0947    PT Time Calculation (min)  46 min    Activity Tolerance  Patient tolerated treatment well    Behavior During Therapy  [facility] for tasks assessed/performed       Past Medical History:  Diagnosis Date  . Arthritis   . Atrial fibrillation ([place])   . Collagen vascular disease ([place])   . DIVERTICULOSIS OF COLON 08/25/2010   Qualifier: Diagnosis of  By: Hoy Morn    . GERD (gastroesophageal reflux disease)   . HEMORRHOIDS, INTERNAL 08/25/2010   Qualifier: Diagnosis of  By: Hoy Morn    . Hypercholesteremia   . Hypertension   . Lipoma of arm    Left    Past Surgical History:  Procedure Laterality Date  . ABDOMINAL HYSTERECTOMY     Partial  . COLONOSCOPY  2009   SLF: 1. screening colonoscopy in 10 years 2. She should follow a high fiber diet She has been given a hand out on high fiber diverticulosois and hemorrhoids  . CYSTECTOMY     Left breast  . ESOPHAGOGASTRODUODENOSCOPY     XTG:GYIRSWNIOEV appearing Schatzki's ring, possible cervical esophageal web, both dilated with passage of a Maloney dilator/small hiatal hernia  . ESOPHAGOGASTRODUODENOSCOPY N/A 10/25/2015   Procedure: ESOPHAGOGASTRODUODENOSCOPY (EGD);  Surgeon: Danie Binder, MD;  [facility];  Service: Endoscopy;  Laterality: N/A;  1215pm  . SAVORY DILATION N/A 10/25/2015   Procedure: SAVORY DILATION;  Surgeon: Danie Binder, MD;  [facility];  Service: Endoscopy;  Laterality: N/A;  . THYROIDECTOMY    . Tumor   05/2010   Left breast under nipple  . 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  Subjective Assessment - 10/05/17 0901    Subjective  No pain today; was not able to attempt walking 30 minutes at gym yet. Will try this weekend and report. Feels she has a weak back and isn't able to lift much.     Pertinent History  RA     Limitations  Sitting;Lifting;Standing;Walking    How long can you sit comfortably?  no problem     How long can you stand comfortably?  15 minutes     How long can you walk comfortably?  unsure if can tolerate 30 minutes without increased Rt hip pain    Patient Stated Goals  less pain, to be able to stand and walk longer, to have better activity tolerance     Pain Score  0-No pain                 10/05/17 0001  Knee/Hip Exercises: Standing  SLS x5  Heel Raises Both;15 reps (to squat)  Forward Lunges Both;10 reps;15  reps  Forward Lunges Limitations Bosu  Lateral Step Up Both;Hand Hold: 0;Step Height: 6";15 reps Insurance risk surveyor for visual cues on proper technique)  Forward Step Up Both;1 set;Hand Hold: 0;Step Height: 6";15 reps  Functional Squat 15 reps  Functional Squat Limitations proper lifting yellow ball from 12in step  Stairs 4 RT reciprocal with 1 hand hold  Rocker Board 2 minutes;Limitations  Rocker Board Limitations Rt/Lt and A/P 2 hand hold assist; 2x 1 minute each  Other Standing Knee Exercises cone taps 10x BLE  Step Down Both;Step Height: 6";10 reps;Hand Hold: 1               Balance Exercises - 10/05/17 1240      Balance Exercises: Standing   Tandem Stance  Foam/compliant surface;30 secs;4 reps modified tandem, alternating foot position, no UE support    SLS  Eyes open;Solid surface;Intermittent upper extremity support;Limitations 10x 4 cone taps BLE        PT  Education - 10/05/17 0911    Education provided  Yes    Education Details  Patient educated on improtance on knee alignments to engage hip musculature and decrease load on knee and hip joints and hip bursa    Person(s) Educated  Patient    Methods  Explanation    Comprehension  Verbalized understanding       PT Short Term Goals - 10/03/17 0955      PT SHORT TERM GOAL #1   Title  Pt Rt hip pain to be no greater than a 5/10 to allow pt to walk for ten minutes.    Time  2    Period  Weeks    Status  Achieved      PT SHORT TERM GOAL #2   Title  PT to only be waking up 1 time a week with right hip pain     Time  2    Period  Weeks    Status  On-going      PT SHORT TERM GOAL #3   Title  PT to be able to sit for 30 minutes in comfort to be able to eat a meal without pain.     Time  3    Period  Weeks    Status  Achieved        PT Long Term Goals - 10/03/17 0955      PT LONG TERM GOAL #1   Title  Pt to be able to walk for 30 minutes with no assistive device and no pain to be able to feel confident returning to the [employer]    Time  4    Period  Weeks    Status  Partially Met      PT LONG TERM GOAL #2   Title  Pt to be able to sit for an hour without increased right hip pain to enjoy going out to eat.     Time  4    Period  Weeks    Status  Achieved      PT LONG TERM GOAL #3   Title  Pt rt hip pain to be no greater than a 2/10 to allow pt to sleep all night without walking from rt hip pain.     Time  4    Period  Weeks    Status  Partially Met      PT LONG TERM GOAL #4   Title  Pt to be able to single leg stance for 10 seconds on both  LE to reduce risk of falls.     Time  4    Period  Weeks    Status  Partially Met            Plan - 10/05/17 0913    Clinical Impression Statement  Pt performing well; c/o twinge in hip post 4 rounds on stairs so finished session there. Improved SLS on left to 15 sec today; Rt 8 sec. [facility]  independent workouts [facility]. 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 Must continue with hip strengthening.     History and Personal Factors relevant to plan of care:  B TKR, RA, OA, back pain    Clinical Presentation  Stable    PT Next Visit Plan  Maybe balance beam, walking lunges; HEP progression for gym if 30 min walking trial goes well.    PT Home Exercise Plan  Sitting:  sit to stand, LAQ, Ankle DF/PF, Supine:  SLR, bridging;  SL: hip abduction.; 12/26: SLS, tandem stance and sidestep with RTB. 09/24/17 - reviewed HEP and limited to sit to stand, side stepping, brdiges, SLS and tandem stance at counter.    Consulted and Agree with Plan of Care  Patient       Patient will benefit from skilled therapeutic intervention in order to improve the following deficits and impairments:     Visit Diagnosis: Pain in right hip  Repeated falls  Muscle weakness (generalized)     Problem List Patient Active Problem List   Diagnosis Date Noted  . Dysphagia, pharyngeal 05/24/2017  . Hyperglycemia 10/12/2014  . Ganglion cyst 09/26/2013  . 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 Urge incontinence 03/25/2013  . Functional constipation 11/20/2012  . Non-ulcer dyspepsia 08/30/2010  . Hyperlipidemia 08/25/2010  . Essential hypertension 08/25/2010  . GERD 08/25/2010    Jeannie Done 10/05/2017, 12:43 PM  [place] [address] [place], [place], [address] Phone: [phone]   Fax:  [phone]  Name: Melissa Barajas MRN: [id number] Date of Birth: 1941/03/03

## 2017-10-08 ENCOUNTER — Ambulatory Visit (INDEPENDENT_AMBULATORY_CARE_PROVIDER_SITE_OTHER): Payer: Medicare Other | Admitting: Otolaryngology

## 2017-10-08 ENCOUNTER — Ambulatory Visit (HOSPITAL_COMMUNITY): Payer: Medicare Other

## 2017-10-08 ENCOUNTER — Encounter (HOSPITAL_COMMUNITY): Payer: Self-pay

## 2017-10-08 DIAGNOSIS — M25551 Pain in right hip: Secondary | ICD-10-CM | POA: Diagnosis not present

## 2017-10-08 DIAGNOSIS — R1312 Dysphagia, oropharyngeal phase: Secondary | ICD-10-CM

## 2017-10-08 DIAGNOSIS — R490 Dysphonia: Secondary | ICD-10-CM | POA: Diagnosis not present

## 2017-10-08 DIAGNOSIS — R296 Repeated falls: Secondary | ICD-10-CM | POA: Diagnosis not present

## 2017-10-08 DIAGNOSIS — M6281 Muscle weakness (generalized): Secondary | ICD-10-CM | POA: Diagnosis not present

## 2017-10-08 DIAGNOSIS — K219 Gastro-esophageal reflux disease without esophagitis: Secondary | ICD-10-CM

## 2017-10-08 NOTE — Therapy (Signed)
[place], [place], [address] Phone: [phone]   Fax:  [phone]  Physical Therapy Treatment  Patient Details  Name: Melissa Barajas MRN: [id number] Date of Birth: February 10, 1941 Referring Provider: Ola Spurr    Encounter Date: 10/08/2017  PT End of Session - 10/08/17 1344    Visit Number  20    Number of Visits  24    Date for PT Re-Evaluation  10/14/17    Authorization Type  Medicare    Authorization - Visit Number  20    Authorization - Number of Visits  24    PT Start Time  7530    PT Stop Time  1426    PT Time Calculation (min)  41 min    Activity Tolerance  Patient tolerated treatment well    Behavior During Therapy  [facility] for tasks assessed/performed       Past Medical History:  Diagnosis Date  . Arthritis   . Atrial fibrillation ([place])   . Collagen vascular disease ([place])   . DIVERTICULOSIS OF COLON 08/25/2010   Qualifier: Diagnosis of  By: Hoy Morn    . GERD (gastroesophageal reflux disease)   . HEMORRHOIDS, INTERNAL 08/25/2010   Qualifier: Diagnosis of  By: Hoy Morn    . Hypercholesteremia   . Hypertension   . Lipoma of arm    Left    Past Surgical History:  Procedure Laterality Date  . ABDOMINAL HYSTERECTOMY     Partial  . COLONOSCOPY  2009   SLF: 1. screening colonoscopy in 10 years 2. She should follow a high fiber diet She has been given a hand out on high fiber diverticulosois and hemorrhoids  . CYSTECTOMY     Left breast  . ESOPHAGOGASTRODUODENOSCOPY     [id number] appearing Schatzki's ring, possible cervical esophageal web, both dilated with passage of a Maloney dilator/small hiatal hernia  . ESOPHAGOGASTRODUODENOSCOPY N/A 10/25/2015   Procedure: ESOPHAGOGASTRODUODENOSCOPY (EGD);  Surgeon: Danie Binder, MD;  [facility];  Service: Endoscopy;  Laterality: N/A;  1215pm  . SAVORY DILATION N/A 10/25/2015   Procedure: SAVORY DILATION;  Surgeon: Danie Binder, MD;  [facility];  Service: Endoscopy;  Laterality: N/A;  . THYROIDECTOMY    . Tumor   05/2010   Left breast under nipple  . 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  Subjective Assessment - 10/08/17 1347    Subjective  Pt denies any hip pain today. She states that her L foot has been bothering her since Friday; she did a lot of walking around [place] and some other stores but she did not try walking 30 mins straight as instructed due to this pain.     Pertinent History  RA     Limitations  Sitting;Lifting;Standing;Walking    How long can you sit comfortably?  no problem     How long can you stand comfortably?  15 minutes     How long can you walk comfortably?  unsure if can tolerate 30 minutes without increased Rt hip pain    Patient Stated Goals  less pain, to be able to stand and walk longer, to have better activity tolerance     Currently in Pain?  No/denies             [facility] Adult PT Treatment/Exercise - 10/08/17 0001  Knee/Hip Exercises: Standing   Heel Raises  Both;15 reps    Heel Raises Limitations  heel and toe    Forward Lunges  Both;15 reps    Forward Lunges Limitations  Bosu    Side Lunges  Both;15 reps    Side Lunges Limitations  Bosu    Hip Abduction  Both;15 reps    Abduction Limitations  GTB    Lateral Step Up  Both;Hand Hold: 0;Step Height: 6";15 reps    Forward Step Up  Both;15 reps;Step Height: 6"    Forward Step Up Limitations  + contralateral knee drive x3" holds    SLS  5x10" each on foam    Other Standing Knee Exercises  deadlifts from 8" box with 8# DB x10 reps    Other Standing Knee Exercises  tandem stance on foam + palov press with RTB x10 reps each      Knee/Hip Exercises: Seated   Sit to Sand  15 reps;without UE support front loaded with blue weighted ball + OH press              PT Education - 10/08/17 1344    Education provided  Yes    Education  Details  exercise technique    Person(s) Educated  Patient    Methods  Explanation;Demonstration    Comprehension  Verbalized understanding;Returned demonstration       PT Short Term Goals - 10/03/17 0955      PT SHORT TERM GOAL #1   Title  Pt Rt hip pain to be no greater than a 5/10 to allow pt to walk for ten minutes.    Time  2    Period  Weeks    Status  Achieved      PT SHORT TERM GOAL #2   Title  PT to only be waking up 1 time a week with right hip pain     Time  2    Period  Weeks    Status  On-going      PT SHORT TERM GOAL #3   Title  PT to be able to sit for 30 minutes in comfort to be able to eat a meal without pain.     Time  3    Period  Weeks    Status  Achieved        PT Long Term Goals - 10/03/17 0955      PT LONG TERM GOAL #1   Title  Pt to be able to walk for 30 minutes with no assistive device and no pain to be able to feel confident [facility] [employer]    Time  4    Period  Weeks    Status  Partially Met      PT LONG TERM GOAL #2   Title  Pt to be able to sit for an hour without increased right hip pain to enjoy going out to eat.     Time  4    Period  Weeks    Status  Achieved      PT LONG TERM GOAL #3   Title  Pt rt hip pain to be no greater than a 2/10 to allow pt to sleep all night without walking from rt hip pain.     Time  4    Period  Weeks    Status  Partially Met      PT LONG TERM GOAL #4   Title  Pt to be able to  single leg stance for 10 seconds on both LE to reduce risk of falls.     Time  4    Period  Weeks    Status  Partially Met            Plan - 10/08/17 1426    Clinical Impression Statement  Pt states that she did not try walking for 30 mins as her L foot has been bothering her since Friday and she didn't want to aggravate it anymore. Otherwise, session continued with established POC focusing on hip strengthening and dynamic hip stability. Pt challenged by progress CKC strengthening, but no pain reported. Pt only  reporting muscle fatigue and soreness at EOS. PT continued to encourage trying walking for 30 mins between now and next session. Continue POC as planned.    Rehab Potential  Good    PT Frequency  3x / week    PT Duration  8 weeks    PT Treatment/Interventions  ADLs/Self Care Home Management;Therapeutic activities;Functional mobility training;Gait training;Stair training;Therapeutic exercise;Balance training;Neuromuscular re-education;Patient/family education;Manual techniques;Dry needling    PT Next Visit Plan  continue CKC hip strengthening and progress as able; continue to encourage walking; HEP progression for gym if 30 min walking trial goes well.    PT Home Exercise Plan  Sitting:  sit to stand, LAQ, Ankle DF/PF, Supine:  SLR, bridging;  SL: hip abduction.; 12/26: SLS, tandem stance and sidestep with RTB. 09/24/17 - reviewed HEP and limited to sit to stand, side stepping, brdiges, SLS and tandem stance at counter.    Consulted and Agree with Plan of Care  Patient       Patient will benefit from skilled therapeutic intervention in order to improve the following deficits and impairments:  Abnormal gait, Decreased activity tolerance, Decreased balance, Decreased strength, Difficulty walking, Pain, Impaired flexibility  Visit Diagnosis: Pain in right hip  Repeated falls  Muscle weakness (generalized)     Problem List Patient Active Problem List   Diagnosis Date Noted  . Dysphagia, pharyngeal 05/24/2017  . Hyperglycemia 10/12/2014  . Ganglion cyst 09/26/2013  . 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 Urge incontinence 03/25/2013  . Functional constipation 11/20/2012  . Non-ulcer dyspepsia 08/30/2010  . Hyperlipidemia 08/25/2010  . Essential hypertension 08/25/2010  . GERD 08/25/2010       Geraldine Solar PT, DPT  [place] [address] [place], [place], [address] Phone: [phone]   Fax:  [phone]  Name:  FATIMA FEDIE MRN: [id number] Date of Birth: 1940-09-27

## 2017-10-10 ENCOUNTER — Other Ambulatory Visit: Payer: Self-pay

## 2017-10-10 ENCOUNTER — Ambulatory Visit (HOSPITAL_COMMUNITY): Payer: Medicare Other | Admitting: Physical Therapy

## 2017-10-10 ENCOUNTER — Encounter (HOSPITAL_COMMUNITY): Payer: Self-pay | Admitting: Physical Therapy

## 2017-10-10 DIAGNOSIS — M25551 Pain in right hip: Secondary | ICD-10-CM | POA: Diagnosis not present

## 2017-10-10 DIAGNOSIS — M6281 Muscle weakness (generalized): Secondary | ICD-10-CM | POA: Diagnosis not present

## 2017-10-10 DIAGNOSIS — R296 Repeated falls: Secondary | ICD-10-CM

## 2017-10-10 NOTE — Therapy (Signed)
[place], [place], [address] Phone: [phone]   Fax:  [phone]  Physical Therapy Treatment  Patient Details  Name: Melissa Barajas MRN: [id number] Date of Birth: 04-Jun-1941 Referring Provider: Ola Spurr    Encounter Date: 10/10/2017  PT End of Session - 10/10/17 0936    Visit Number  21    Number of Visits  21    Date for PT Re-Evaluation  10/14/17    Authorization Type  Medicare    Authorization - Visit Number  21    Authorization - Number of Visits  21    PT Start Time  0900    PT Stop Time  0932    PT Time Calculation (min)  32 min    Activity Tolerance  Patient tolerated treatment well    Behavior During Therapy  [facility] for tasks assessed/performed       Past Medical History:  Diagnosis Date  . Arthritis   . Atrial fibrillation ([place])   . Collagen vascular disease ([employer])   . DIVERTICULOSIS OF COLON 08/25/2010   Qualifier: Diagnosis of  By: Hoy Morn    . GERD (gastroesophageal reflux disease)   . HEMORRHOIDS, INTERNAL 08/25/2010   Qualifier: Diagnosis of  By: Hoy Morn    . Hypercholesteremia   . Hypertension   . Lipoma of arm    Left    Past Surgical History:  Procedure Laterality Date  . ABDOMINAL HYSTERECTOMY     Partial  . COLONOSCOPY  2009   SLF: 1. screening colonoscopy in 10 years 2. She should follow a high fiber diet She has been given a hand out on high fiber diverticulosois and hemorrhoids  . CYSTECTOMY     Left breast  . ESOPHAGOGASTRODUODENOSCOPY     [id number] appearing Schatzki's ring, possible cervical esophageal web, both dilated with passage of a Maloney dilator/small hiatal hernia  . ESOPHAGOGASTRODUODENOSCOPY N/A 10/25/2015   Procedure: ESOPHAGOGASTRODUODENOSCOPY (EGD);  Surgeon: Danie Binder, MD;  [facility];  Service: Endoscopy;  Laterality: N/A;  1215pm  . SAVORY DILATION N/A 10/25/2015   Procedure: SAVORY DILATION;  Surgeon: Danie Binder, MD;  [facility];  Service: Endoscopy;  Laterality: N/A;  . THYROIDECTOMY    . Tumor   05/2010   Left breast under nipple  . 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  Subjective Assessment - 10/10/17 0907    Subjective  PT states that a couple of days ago she had some pain for the first time in two or three weeks.  She has gone back to the [facility] and did her class withiout difficulty.     Pertinent History  RA     Limitations  Sitting;Lifting;Standing;Walking    How long can you sit comfortably?  no problem     How long can you stand comfortably?  15 minutes     How long can you walk comfortably?  unsure if can tolerate 30 minutes without increased Rt hip pain    Patient Stated Goals  less pain, to be able to stand and walk longer, to have better activity tolerance     Currently in Pain?  No/denies         [facility] PT Assessment - 10/10/17 0001      Assessment   Medical Diagnosis  Rt trochanteric bursitis    Referring Provider  Aaron Edelman [place]     Onset Date/Surgical Date  -- unknown     Next MD Visit  not scheduled     Prior Therapy  none      Precautions   Precautions  None      Restrictions   Weight Bearing Restrictions  No      Home Environment   Living Environment  Private residence    Home Access  Stairs to enter    Entrance Stairs-Number of Steps  2    Home Layout  Laundry or work area in basement      Prior Function   Level of Independence  Independent    Vocation  Retired    Scientist, 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 ; going to bible study which requires a lot of walking and stair climbing       Cognition   Overall Cognitive Status  Within Functional Limits for tasks assessed      Observation/Other Assessments   Focus on Therapeutic Outcomes (FOTO)   56 was 45       Functional Tests   Functional tests  Single leg stance;Sit to Stand      Single Leg Stance    Comments  Rt:  13 section was 1 second;  LT 11 was 1 second       Sit to Stand   Comments  5x 9.09 on 1/4 was 13.13; evaluation  was 25.95      Strength   Right Hip Flexion  5/5 was 2+/5 at eval; on 1/4 3/5    Right Hip Extension  4+/5 was 2/5 at evaluation and 3- on 1/4     Right Hip ABduction  4+/5 was 2+    Left Hip Flexion  5/5 was 3+/5 at eval on 1/4 4/5    Left Hip Extension  5/5 was 3/5 at eval; 4- on 1/4     Left Hip ABduction  5/5 was 3-/5    Right Knee Flexion  5/5 was 3+ at eval; 4-/5 on 09/14/2017    Right Knee Extension  5/5 was 3/5     Left Knee Flexion  5/5 was 4/5     Left Knee Extension  5/5 was 3/5     Right Ankle Dorsiflexion  5/5 was 3+/5     Right Ankle Plantar Flexion  4/5 was 3+/5    Left Ankle Dorsiflexion  5/5 was 3+/5 at eval; 1/4 4+    Left Ankle Plantar Flexion  4/5 was 3+/5                  [facility] Adult PT Treatment/Exercise - 10/10/17 0001      Knee/Hip Exercises: Standing   Heel Raises  Both;15 reps    SLS  x5 B       Knee/Hip Exercises: Seated   Sit to Sand  15 reps             PT Education - 10/10/17 0935    Education provided  Yes    Education Details  Complete heel raises at wall to prevent substitutional patterns, Go the the [employer] and walk on a regular basisi.     Person(s) Educated  Patient    Methods  Explanation    Comprehension  Verbalized understanding       PT Short Term Goals - 10/10/17 0929      PT SHORT TERM GOAL #1   Title  Pt Rt hip  pain to be no greater than a 5/10 to allow pt to walk for ten minutes.    Time  2    Period  Weeks    Status  Achieved      PT SHORT TERM GOAL #2   Title  PT to only be waking up 1 time a week with right hip pain     Time  2    Period  Weeks    Status  Achieved      PT SHORT TERM GOAL #3   Title  PT to be able to sit for 30 minutes in comfort to be able to eat a meal without pain.     Time  3    Period  Weeks    Status  Achieved        PT Long Term Goals -  10/10/17 0930      PT LONG TERM GOAL #1   Title  Pt to be able to walk for 30 minutes with no assistive device and no pain to be able to feel confident returning to the [employer]    Time  4    Period  Weeks    Status  Achieved      PT LONG TERM GOAL #2   Title  Pt to be able to sit for an hour without increased right hip pain to enjoy going out to eat.     Time  4    Period  Weeks    Status  Achieved      PT LONG TERM GOAL #3   Title  Pt rt hip pain to be no greater than a 2/10 to allow pt to sleep all night without walking from rt hip pain.     Time  4    Period  Weeks    Status  Achieved      PT LONG TERM GOAL #4   Title  Pt to be able to single leg stance for 10 seconds on both LE to reduce risk of falls.     Time  4    Period  Weeks    Status  Achieved            Plan - 10/10/17 3419    Clinical Impression Statement  PT reassessed.  All mm are 4+ to 5/5 with no hiip pain and all goals met.  Pt is ready for discharge.     Rehab Potential  Good    PT Frequency  3x / week    PT Duration  8 weeks    PT Treatment/Interventions  ADLs/Self Care Home Management;Therapeutic activities;Functional mobility training;Gait training;Stair training;Therapeutic exercise;Balance training;Neuromuscular re-education;Patient/family education;Manual techniques;Dry needling    PT Next Visit Plan  Discharg pt     PT Home Exercise Plan  Sitting:  sit to stand, LAQ, Ankle DF/PF, Supine:  SLR, bridging;  SL: hip abduction.; 12/26: SLS, tandem stance and sidestep with RTB. 09/24/17 - reviewed HEP and limited to sit to stand, side stepping, brdiges, SLS and tandem stance at counter.    Consulted and Agree with Plan of Care  Patient       Patient will benefit from skilled therapeutic intervention in order to improve the following deficits and impairments:  Abnormal gait, Decreased activity tolerance, Decreased balance, Decreased strength, Difficulty walking, Pain, Impaired flexibility  Visit  Diagnosis: Pain in right hip  Repeated falls  Muscle weakness (generalized)     Problem List Patient Active Problem List   Diagnosis Date  Noted  . Dysphagia, pharyngeal 05/24/2017  . Hyperglycemia 10/12/2014  . Ganglion cyst 09/26/2013  . 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 Urge incontinence 03/25/2013  . Functional constipation 11/20/2012  . Non-ulcer dyspepsia 08/30/2010  . Hyperlipidemia 08/25/2010  . Essential hypertension 08/25/2010  . GERD 08/25/2010   Rayetta Humphrey, PT CLT [phone] 10/10/2017, 9:38 AM  [place], [place], [address] Phone: [phone]   Fax:  [phone]  Name: Melissa Barajas MRN: [id number] Date of Birth: 1940/10/14   PHYSICAL THERAPY DISCHARGE SUMMARY  Visits from Start of Care: 21  Current functional level related to goals / functional outcomes: See above   Remaining deficits: none   Education / Equipment: HEP Plan: Patient agrees to discharge.  Patient goals were met. Patient is being discharged due to meeting the stated rehab goals.  ?????        Rayetta Humphrey, [place] CLT [phone]

## 2017-10-12 ENCOUNTER — Encounter (HOSPITAL_COMMUNITY): Payer: Medicare Other

## 2017-10-15 ENCOUNTER — Encounter (HOSPITAL_COMMUNITY): Payer: Medicare Other

## 2017-10-17 ENCOUNTER — Encounter (HOSPITAL_COMMUNITY): Payer: Medicare Other | Admitting: Physical Therapy

## 2017-10-19 ENCOUNTER — Encounter (HOSPITAL_COMMUNITY): Payer: Medicare Other

## 2017-10-19 ENCOUNTER — Other Ambulatory Visit: Payer: Self-pay | Admitting: Family Medicine

## 2017-10-22 ENCOUNTER — Encounter (HOSPITAL_COMMUNITY): Payer: Medicare Other

## 2017-10-24 ENCOUNTER — Encounter (HOSPITAL_COMMUNITY): Payer: Medicare Other | Admitting: Physical Therapy

## 2017-10-26 ENCOUNTER — Ambulatory Visit (HOSPITAL_COMMUNITY): Payer: Medicare Other | Admitting: Physical Therapy

## 2017-10-29 ENCOUNTER — Other Ambulatory Visit: Payer: Self-pay | Admitting: Family Medicine

## 2017-10-30 ENCOUNTER — Encounter: Payer: Self-pay | Admitting: Family Medicine

## 2017-10-30 ENCOUNTER — Ambulatory Visit (INDEPENDENT_AMBULATORY_CARE_PROVIDER_SITE_OTHER): Payer: Medicare Other | Admitting: Family Medicine

## 2017-10-30 VITALS — BP 138/72 | Ht 66.0 in | Wt 182.8 lb

## 2017-10-30 DIAGNOSIS — K219 Gastro-esophageal reflux disease without esophagitis: Secondary | ICD-10-CM | POA: Diagnosis not present

## 2017-10-30 DIAGNOSIS — I10 Essential (primary) hypertension: Secondary | ICD-10-CM

## 2017-10-30 DIAGNOSIS — M25512 Pain in left shoulder: Secondary | ICD-10-CM | POA: Diagnosis not present

## 2017-10-30 DIAGNOSIS — E7849 Other hyperlipidemia: Secondary | ICD-10-CM

## 2017-10-30 DIAGNOSIS — F09 Unspecified mental disorder due to known physiological condition: Secondary | ICD-10-CM | POA: Diagnosis not present

## 2017-10-30 MED ORDER — DONEPEZIL HCL 5 MG PO TABS: 5.0000 mg | ORAL_TABLET | Freq: Every day | ORAL | 5 refills | Status: DC

## 2017-10-30 NOTE — Patient Instructions (Signed)
Follow up in 2-3 weeks 

## 2017-10-30 NOTE — Progress Notes (Signed)
Subjective:    Patient ID: Melissa Barajas, female    DOB: Nov 06, 1940, 77 y.o.   MRN: [id number]  Hypertension  This is a chronic problem. Associated symptoms include headaches. Pertinent negatives include no chest pain or shortness of breath.  Patient for blood pressure check up. Patient relates compliance with meds. Todays BP reviewed with the patient. Patient denies issues with medication. Patient relates reasonable diet. Patient tries to minimize salt. Patient aware of BP goals.  Patient suffers with insomnia.  This is been going on for a while.  The patient finds it necessary to use medication to help sleep.  Patient finds it if not using medication has significant troubles.  Denies abusing the medication.  Denies any negative side effects.  Patient here for follow-up regarding cholesterol.  Patient does try to maintain a reasonable diet.  Patient does take the medication on a regular basis.  Denies missing a dose.  The patient denies any obvious side effects.  Prior blood work results reviewed with the patient.  The patient is aware of his cholesterol goals and the need to keep it under good control to lessen the risk of disease.  Patient relates left shoulder pain discomfort radiates down the arm hurts with certain movements limited range of motion present for several weeks did not nothing to trigger it did nothing to help it  Patient relates progressive memory dysfunction having a hard time focusing hard time remembering following through.  Remembering names are difficult.  Sometimes thinking things through is difficult but she is able to drive without getting lost no erratic behaviors at home family has not said anything   Review of Systems  Constitutional: Negative for activity change, fatigue and fever.  HENT: Negative for congestion.   Respiratory: Negative for cough, chest tightness and shortness of breath.   Cardiovascular: Negative for chest pain and leg swelling.    Gastrointestinal: Negative for abdominal pain.  Skin: Negative for color change.  Neurological: Positive for headaches.  Psychiatric/Behavioral: Negative for behavioral problems.       Objective:   Physical Exam  Constitutional: She appears well-developed and well-nourished. No distress.  HENT:  Head: Normocephalic and atraumatic.  Eyes: Right eye exhibits no discharge. Left eye exhibits no discharge.  Neck: No tracheal deviation present.  Cardiovascular: Normal rate, regular rhythm and normal heart sounds.  No murmur heard. Pulmonary/Chest: Effort normal and breath sounds normal. No respiratory distress. She has no wheezes. She has no rales.  Musculoskeletal: She exhibits no edema.  Lymphadenopathy:    She has no cervical adenopathy.  Neurological: She is alert. She exhibits normal muscle tone.  Skin: Skin is warm and dry. No erythema.  Psychiatric: Her behavior is normal.  Vitals reviewed.         Assessment & Plan:  1. Essential hypertension Blood pressure good control patient would like to stop her diuretic we will hold the diuretic and potassium recheck blood pressure in several weeks - Basic Metabolic Panel (BMET)  2. Gastroesophageal reflux disease without esophagitis Reflux under good control with medication but patient is having a lot of hoarseness she states ENT check this out and evaluated her thoroughly they recommended that she have a EGD she has a colonoscopy coming up we will send notification to her gastroenterologist  3. Other hyperlipidemia She takes her medication watch his diet lab work ordered previous lab work reviewed - Lipid Profile - Hepatic function panel  4. Cognitive dysfunction Significant cognitive dysfunction with memory dysfunction not  quite bad enough to call this Alzheimer's but she scored a 24 out of 30 on Montreal cognitive assessment start Aricept 5 mg daily.  Monitor closely may need neurology consult if worsening-patient is allowed  to continue driving but I recommend driving only needed familiar areas  5. Acute pain of left shoulder Anti-inflammatory she already is on I recommend

## 2017-10-31 ENCOUNTER — Telehealth: Payer: Self-pay | Admitting: Family Medicine

## 2017-10-31 LAB — BASIC METABOLIC PANEL
BUN/Creatinine Ratio: 14 (ref 12–28)
BUN: 12 mg/dL (ref 8–27)
CALCIUM: 10.8 mg/dL — AB (ref 8.7–10.3)
CHLORIDE: 95 mmol/L — AB (ref 96–106)
CO2: 28 mmol/L (ref 20–29)
Creatinine, Ser: 0.87 mg/dL (ref 0.57–1.00)
GFR calc non Af Amer: 66 mL/min/{1.73_m2} (ref >59)
GFR, EST AFRICAN AMERICAN: 76 mL/min/{1.73_m2} (ref >59)
Glucose: 100 mg/dL — ABNORMAL HIGH (ref 65–99)
Potassium: 3.7 mmol/L (ref 3.5–5.2)
Sodium: 141 mmol/L (ref 134–144)

## 2017-10-31 LAB — LIPID PANEL
Chol/HDL Ratio: 3.8 ratio (ref 0.0–4.4)
Cholesterol, Total: 170 mg/dL (ref 100–199)
HDL: 45 mg/dL (ref >39)
LDL Calculated: 110 mg/dL — ABNORMAL HIGH (ref 0–99)
TRIGLYCERIDES: 76 mg/dL (ref 0–149)
VLDL CHOLESTEROL CAL: 15 mg/dL (ref 5–40)

## 2017-10-31 LAB — HEPATIC FUNCTION PANEL
ALK PHOS: 79 IU/L (ref 39–117)
ALT: 19 IU/L (ref 0–32)
AST: 28 IU/L (ref 0–40)
Albumin: 4.7 g/dL (ref 3.5–4.8)
BILIRUBIN, DIRECT: 0.2 mg/dL (ref 0.00–0.40)
Bilirubin Total: 0.5 mg/dL (ref 0.0–1.2)
TOTAL PROTEIN: 7.8 g/dL (ref 6.0–8.5)

## 2017-10-31 NOTE — Telephone Encounter (Signed)
May have this +1 refill 

## 2017-10-31 NOTE — Telephone Encounter (Signed)
rx sent through rx request earlier and approved by dr scott. rx faxed and pt notified.

## 2017-10-31 NOTE — Telephone Encounter (Signed)
Requesting refill on Temazepam 30 mg.  She said she has 1 pill left.  [facility] [employer] [facility]

## 2017-11-02 ENCOUNTER — Other Ambulatory Visit: Payer: Self-pay | Admitting: *Deleted

## 2017-11-02 DIAGNOSIS — E876 Hypokalemia: Secondary | ICD-10-CM

## 2017-11-16 DIAGNOSIS — M19012 Primary osteoarthritis, left shoulder: Secondary | ICD-10-CM | POA: Diagnosis not present

## 2017-11-16 DIAGNOSIS — E876 Hypokalemia: Secondary | ICD-10-CM | POA: Diagnosis not present

## 2017-11-16 DIAGNOSIS — M25512 Pain in left shoulder: Secondary | ICD-10-CM | POA: Diagnosis not present

## 2017-11-17 LAB — PTH, INTACT AND CALCIUM
Calcium: 10.5 mg/dL — ABNORMAL HIGH (ref 8.7–10.3)
PTH: 25 pg/mL (ref 15–65)

## 2017-11-17 LAB — POTASSIUM: POTASSIUM: 3.8 mmol/L (ref 3.5–5.2)

## 2017-11-20 ENCOUNTER — Encounter: Payer: Self-pay | Admitting: Family Medicine

## 2017-11-20 ENCOUNTER — Ambulatory Visit (INDEPENDENT_AMBULATORY_CARE_PROVIDER_SITE_OTHER): Payer: Medicare Other | Admitting: Family Medicine

## 2017-11-20 NOTE — Progress Notes (Signed)
   Subjective:    Patient ID: Melissa Barajas, female    DOB: May 19, 1941, 77 y.o.   MRN: [id number]  HPI  Patient arrives to follow upon recent blood work to recheck her calcium and potassium She is here today to discuss her elevated calcium.  This is been elevated on a few different accounts.  Her PTH is in the low normal range therefore it needs further evaluation in order to pinpoint the cause of her elevated calcium we also went over all of her lab work as well as her medications history of hypertension Results for orders placed or performed in visit on 11/02/17  Potassium  Result Value Ref Range   Potassium 3.8 3.5 - 5.2 mmol/L  PTH, Intact and Calcium  Result Value Ref Range   Calcium 10.5 (H) 8.7 - 10.3 mg/dL   PTH 25 15 - 65 pg/mL   PTH Interp Comment     Review of Systems  Constitutional: Negative for activity change, fatigue and fever.  HENT: Negative for congestion.   Respiratory: Negative for cough, chest tightness and shortness of breath.   Cardiovascular: Negative for chest pain and leg swelling.  Gastrointestinal: Negative for abdominal pain.  Skin: Negative for color change.  Neurological: Negative for headaches.  Psychiatric/Behavioral: Negative for behavioral problems.       Objective:   Physical Exam  Constitutional: She appears well-developed and well-nourished. No distress.  HENT:  Head: Normocephalic and atraumatic.  Eyes: Right eye exhibits no discharge. Left eye exhibits no discharge.  Neck: No tracheal deviation present.  Cardiovascular: Normal rate, regular rhythm and normal heart sounds.  No murmur heard. Pulmonary/Chest: Effort normal and breath sounds normal. No respiratory distress. She has no wheezes. She has no rales.  Musculoskeletal: She exhibits no edema.  Lymphadenopathy:    She has no cervical adenopathy.  Neurological: She is alert. She exhibits normal muscle tone.  Skin: Skin is warm and dry. No erythema.  Psychiatric: Her behavior is  normal.  Vitals reviewed.  Patient is on vitamin D 1000 units daily Patient is back on her diuretic and potassium results look good blood pressure looks good Blood pressure recheck good     Assessment & Plan:  Persistent elevated calcium need to rule out other causes will be looking at vitamin D in addition we will be looking at additional testing

## 2017-11-26 DIAGNOSIS — H5203 Hypermetropia, bilateral: Secondary | ICD-10-CM | POA: Diagnosis not present

## 2017-11-26 DIAGNOSIS — H52223 Regular astigmatism, bilateral: Secondary | ICD-10-CM | POA: Diagnosis not present

## 2017-11-26 DIAGNOSIS — H16223 Keratoconjunctivitis sicca, not specified as Sjogren's, bilateral: Secondary | ICD-10-CM | POA: Diagnosis not present

## 2017-11-26 DIAGNOSIS — H524 Presbyopia: Secondary | ICD-10-CM | POA: Diagnosis not present

## 2017-11-29 ENCOUNTER — Other Ambulatory Visit: Payer: Self-pay

## 2017-11-29 ENCOUNTER — Ambulatory Visit (INDEPENDENT_AMBULATORY_CARE_PROVIDER_SITE_OTHER): Payer: Medicare Other | Admitting: Gastroenterology

## 2017-11-29 ENCOUNTER — Encounter: Payer: Self-pay | Admitting: Gastroenterology

## 2017-11-29 DIAGNOSIS — K625 Hemorrhage of anus and rectum: Secondary | ICD-10-CM

## 2017-11-29 DIAGNOSIS — K219 Gastro-esophageal reflux disease without esophagitis: Secondary | ICD-10-CM

## 2017-11-29 DIAGNOSIS — R1313 Dysphagia, pharyngeal phase: Secondary | ICD-10-CM

## 2017-11-29 MED ORDER — NA SULFATE-K SULFATE-MG SULF 17.5-3.13-1.6 GM/177ML PO SOLN: 1.0000 | ORAL | 0 refills | Status: DC

## 2017-11-29 NOTE — Assessment & Plan Note (Signed)
SYMPTOMS FAIRLY WELL CONTROLLED.  DRINK WATER TO KEEP YOUR URINE LIGHT YELLOW. AVOID REFLUX TRIGGERS.  HANDOUT GIVEN. CONTINUE OMEPRAZOLE.  INCREASE TO 30 MINUTES PRIOR TO YOUR MEALS TWICE DAILY. USE RANITIDNE HELP MOST WHEN USED AS NEEDED. TAKE ONE BEFORE EATING ANYTHING THAT MAY TRIGGER REFLUX.  USE PREPARATION H FOUR TIMES  A DAY IF NEEDED TO RELIEVE RECTAL PAIN/PRESSURE/BLEEDING.   FOLLOW UP IN 6 MOS.

## 2017-11-29 NOTE — Progress Notes (Signed)
Subjective:    Patient ID: Melissa Barajas, female    DOB: 1941-04-16, 77 y.o.   MRN: [id number]  Kathyrn Drown, MD  HPI  PROBLEMS SWALLOWING STILL. FEELS LIKE HER THROAT SWOLLEN UP THIS PAST SUMMER AND HER VOICE GETS MORE HOARSE WHEN SHE TALKS. AVOID TRIGGERS FOR REFLUX AND THROAT FEELS BETTER. FEELS A LUMP STILL BUT NOT AS BAD. DOESN'T FEEL HEARTBURN JUST HAS HOARSENESS. DOESN'T WAKE UP IN THE MIDDLE OF THE NIGHT CHOKING.  LAST TCS 2009 NO POLYPS. THIS YEAR SAW BLOOD IN HER STOOL x2 AFTER STRAINING. SAW IT WHEN SHE WIPED. BMs: PERIODIC CONSTIPATION BUT THIS YEAR BOWELS REGULAR. USED TO HAVE MORE CONSTIPATION BUT NOW MORE REGULAR. WOULD LIKE A LOW VOLUME PREP.  PT DENIES FEVER, CHILLS, HEMATEMESIS, nausea, vomiting, melena, diarrhea, CHEST PAIN, SHORTNESS OF BREATH, CHANGE IN BOWEL IN HABITS, abdominal pain, problems with sedation, OR heartburn or indigestion.  Past Medical History:  Diagnosis Date  . Arthritis   . Atrial fibrillation ([place])   . Collagen vascular disease ([place])   . DIVERTICULOSIS OF COLON 08/25/2010   Qualifier: Diagnosis of  By: Hoy Morn    . GERD (gastroesophageal reflux disease)   . HEMORRHOIDS, INTERNAL 08/25/2010   Qualifier: Diagnosis of  By: Hoy Morn    . Hypercholesteremia   . Hypertension   . Lipoma of arm    Left   Past Surgical History:  Procedure Laterality Date  . ABDOMINAL HYSTERECTOMY     Partial  . COLONOSCOPY  2009   SLF: 1. screening colonoscopy in 10 years 2. She should follow a high fiber diet She has been given a hand out on high fiber diverticulosois and hemorrhoids  . CYSTECTOMY     Left breast  . ESOPHAGOGASTRODUODENOSCOPY     ERX:[id number] appearing Schatzki's ring, possible cervical esophageal web, both dilated with passage of a Maloney dilator/small hiatal hernia  . ESOPHAGOGASTRODUODENOSCOPY N/A 10/25/2015   Procedure: ESOPHAGOGASTRODUODENOSCOPY (EGD);  Surgeon: Danie Binder, MD;  Location: [facility];  Service:  Endoscopy;  Laterality: N/A;  1215pm  . SAVORY DILATION N/A 10/25/2015   Procedure: SAVORY DILATION;  Surgeon: Danie Binder, MD;  Location: [facility];  Service: Endoscopy;  Laterality: N/A;  . THYROIDECTOMY    . Tumor   05/2010   Left breast under nipple  . 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 Aricept [Donepezil Hcl]     Sleep deprived  . Hydrocodone Nausea Only  . Lisinopril     Angio edema  . Naprosyn [Naproxen]     nausea  . Vioxx [Rofecoxib]     Current Outpatient Medications  Medication Sig    . acetaminophen (TYLENOL) 650 MG CR tablet Take 650 mg by mouth every 8 (eight) hours as needed for pain.    . Biotin (BIOTIN MAXIMUM STRENGTH) 10 MG TABS Take 1 tablet by mouth daily.     . Calcium Carbonate-Vitamin D (CALTRATE 600+D PO) Take 1 tablet by mouth daily.     Marland Kitchen CARTIA XT 300 MG 24 hr capsule TAKE 1 CAPSULE BY MOUTH EVERY DAY    . etodolac (LODINE) 400 MG tablet Take 1 tablet (400 mg total) by mouth 2 (two) times daily.    . indapamide (LOZOL) 2.5 MG tablet TAKE 1 TABLET(2.5 MG) BY MOUTH DAILY    . omeprazole (PRILOSEC) 20 MG capsule Take 20 mg by mouth daily.    Marland Kitchen OVER  THE COUNTER MEDICATION Take 1 tablet by mouth daily. Focus factor    . OVER THE COUNTER MEDICATION LIPOZENE    . potassium chloride (K-DUR) 10 MEQ tablet TAKE 1 TABLET(10 MEQ) BY MOUTH TWICE DAILY    . pravastatin (PRAVACHOL) 80 MG tablet TAKE 1 TABLET(80 MG) BY MOUTH DAILY    . Probiotic Product (PROBIOTIC DAILY PO) Take 1 tablet by mouth daily.     . ranitidine (ZANTAC) 150 MG capsule Take 150 mg by mouth every evening.    . temazepam (RESTORIL) 30 MG capsule TAKE ONE CAPSULE BY MOUTH AT BEDTIME    . conjugated estrogens (PREMARIN) vaginal cream Use every other night, 1 gram (Patient not taking: Reported on 11/29/2017)    .      .      .       Review of Systems PER HPI OTHERWISE ALL SYSTEMS ARE NEGATIVE.    Objective:   Physical Exam    Constitutional: She is oriented to person, place, and time. She appears well-developed and well-nourished. No distress.  HENT:  Head: Normocephalic and atraumatic.  Mouth/Throat: Oropharynx is clear and moist. No oropharyngeal exudate.  Eyes: Pupils are equal, round, and reactive to light. No scleral icterus.  Neck: Normal range of motion. Neck supple.  Cardiovascular: Normal rate, regular rhythm and normal heart sounds.  Pulmonary/Chest: Effort normal and breath sounds normal. No respiratory distress.  Abdominal: Soft. Bowel sounds are normal. She exhibits no distension. There is no tenderness.  Musculoskeletal: She exhibits no edema.  Lymphadenopathy:    She has no cervical adenopathy.  Neurological: She is alert and oriented to person, place, and time.  NO FOCAL DEFICITS  Psychiatric:  SLIGHTLY ANXIOUS MOOD, NL AFFECT  Vitals reviewed.     Assessment & Plan:

## 2017-11-29 NOTE — Assessment & Plan Note (Signed)
Associated with constipation/straining. DIFFERENTIAL DIAGNOSIS INCLUDES HEMORRHOIDS, COLON POLYPS, AVMs, & LESS LIKELY COLON CA.  DRINK WATER TO KEEP YOUR URINE LIGHT YELLOW. FOLLOW A HIGH FIBER DIET. AVOID ITEMS THAT CAUSE BLOATING & GAS.  HANDOUT GIVEN. COMPLETE COLONOSCOPY. DISCUSSED PROCEDURE, BENEFITS, & RISKS: < 1% chance of medication reaction, bleeding, perforation, or rupture of spleen/liver. USE PREPARATION H FOUR TIMES  A DAY IF NEEDED TO RELIEVE RECTAL PAIN/PRESSURE/BLEEDING. FOLLOW UP IN 6 MOS.

## 2017-11-29 NOTE — Progress Notes (Signed)
cc'ed to pcp °

## 2017-11-29 NOTE — Progress Notes (Signed)
ON RECALL  °

## 2017-11-29 NOTE — Patient Instructions (Addendum)
DRINK WATER TO KEEP YOUR URINE LIGHT YELLOW.  FOLLOW A HIGH FIBER DIET. AVOID ITEMS THAT CAUSE BLOATING & GAS. SEE INFO BELOW.   AVOID REFLUX TRIGGERS. SEE INFO BELOW.  CONTINUE OMEPRAZOLE.  INCREASE TO 30 MINUTES PRIOR TO YOUR MEALS TWICE DAILY.  USE RANITIDNE HELP MOST WHEN USED AS NEEDED. TAKE ONE BEFORE EATING ANYTHING THAT MAY TRIGGER REFLUX.   COMPLETE COLONOSCOPY.  USE PREPARATION H FOUR TIMES  A DAY IF NEEDED TO RELIEVE RECTAL PAIN/PRESSURE/BLEEDING.   FOLLOW UP IN 6 MOS.     Lifestyle and home remedies TO CONTROL HEARTBURN You may eliminate or reduce the frequency of heartburn by making the following lifestyle changes:  . Control your weight. Being overweight is a major risk factor for heartburn and GERD. Excess pounds put pressure on your abdomen, pushing up your stomach and causing acid to back up into your esophagus.   . Eat smaller meals. 4 TO 6 MEALS A DAY. This reduces pressure on the lower esophageal sphincter, helping to prevent the valve from opening and acid from washing back into your esophagus.   Dolphus Jenny your belt. Clothes that fit tightly around your waist put pressure on your abdomen and the lower esophageal sphincter.   . Eliminate heartburn triggers. Everyone has specific triggers. Common triggers such as fatty or fried foods, spicy food, tomato sauce, carbonated beverages, alcohol, chocolate, mint, garlic, onion, caffeine and nicotine may make heartburn worse.   Marland Kitchen Avoid stooping or bending. Tying your shoes is OK. Bending over for longer periods to weed your garden isn't, especially soon after eating.   . Don't lie down after a meal. Wait at least three to four hours after eating before going to bed, and don't lie down right after eating.   Alternative medicine . Several home remedies exist for treating GERD, but they provide only temporary relief. They include drinking baking soda (sodium bicarbonate) added to water or drinking other fluids such as  baking soda mixed with cream of tartar and water. . Although these liquids create temporary relief by neutralizing, washing away or buffering acids, eventually they aggravate the situation by adding gas and fluid to your stomach, increasing pressure and causing more acid reflux. Further, adding more sodium to your diet may increase your blood pressure and add stress to your heart, and excessive bicarbonate ingestion can alter the acid-base balance in your body.  High-Fiber Diet A high-fiber diet changes your normal diet to include more whole grains, legumes, fruits, and vegetables. Changes in the diet involve replacing refined carbohydrates with unrefined foods. The calorie level of the diet is essentially unchanged. The Dietary Reference Intake (recommended amount) for adult males is 38 grams per day. For adult females, it is 25 grams per day. Pregnant and lactating women should consume 28 grams of fiber per day.Fiber is the intact part of a plant that is not broken down during digestion. Functional fiber is fiber that has been isolated from the plant to provide a beneficial effect in the body.  PURPOSE  Increase stool bulk.   Ease and regulate bowel movements.   Lower cholesterol.   REDUCE RISK OF COLON CANCER  INDICATIONS THAT YOU NEED MORE FIBER  Constipation and hemorrhoids.   Uncomplicated diverticulosis (intestine condition) and irritable bowel syndrome.   Weight management.   As a protective measure against hardening of the arteries (atherosclerosis), diabetes, and cancer.   GUIDELINES FOR INCREASING FIBER IN THE DIET  Start adding fiber to the diet slowly. A gradual increase  of about 5 more grams (2 slices of whole-wheat bread, 2 servings of most fruits or vegetables, or 1 bowl of high-fiber cereal) per day is best. Too rapid an increase in fiber may result in constipation, flatulence, and bloating.   Drink enough water and fluids to keep your urine clear or pale yellow. Water,  juice, or caffeine-free drinks are recommended. Not drinking enough fluid may cause constipation.   Eat a variety of high-fiber foods rather than one type of fiber.   Try to increase your intake of fiber through using high-fiber foods rather than fiber pills or supplements that contain small amounts of fiber.   The goal is to change the types of food eaten. Do not supplement your present diet with high-fiber foods, but replace foods in your present diet.   INCLUDE A VARIETY OF FIBER SOURCES  Replace refined and processed grains with whole grains, canned fruits with fresh fruits, and incorporate other fiber sources. White rice, white breads, and most bakery goods contain little or no fiber.   Brown whole-grain rice, buckwheat oats, and many fruits and vegetables are all good sources of fiber. These include: broccoli, Brussels sprouts, cabbage, cauliflower, beets, sweet potatoes, white potatoes (skin on), carrots, tomatoes, eggplant, squash, berries, fresh fruits, and dried fruits.   Cereals appear to be the richest source of fiber. Cereal fiber is found in whole grains and bran. Bran is the fiber-rich outer coat of cereal grain, which is largely removed in refining. In whole-grain cereals, the bran remains. In breakfast cereals, the largest amount of fiber is found in those with "bran" in their names. The fiber content is sometimes indicated on the label.   You may need to include additional fruits and vegetables each day.   In baking, for 1 cup white flour, you may use the following substitutions:   1 cup whole-wheat flour minus 2 tablespoons.   1/2 cup white flour plus 1/2 cup whole-wheat flour.

## 2017-11-29 NOTE — Assessment & Plan Note (Addendum)
Due to a bone spur/uncontrolled GERD. NO WARNING SIGNS/SYMPTOMS. RECENT EGD/DIL IN 2017. SYMPTOMS NOT IDEALLY CONTROLLED.  DRINK WATER TO KEEP YOUR URINE LIGHT YELLOW. CONTINUE OMEPRAZOLE.  INCREASE TO 30 MINUTES PRIOR TO YOUR MEALS TWICE DAILY. USE RANITIDNE HELP MOST WHEN USED AS NEEDED. TAKE ONE BEFORE EATING ANYTHING THAT MAY TRIGGER REFLUX. NO INDICATION FOR ENDOSCOPY AT THIS TIME. FOLLOW UP IN 6 MOS.

## 2017-11-29 NOTE — Addendum Note (Signed)
Addended by: Danie Binder on: 11/29/2017 11:48 AM   Modules accepted: Level of Service

## 2017-12-01 LAB — VITAMIN D 1,25 DIHYDROXY
VITAMIN D 1, 25 (OH) TOTAL: 48 pg/mL
Vitamin D3 1, 25 (OH)2: 46 pg/mL

## 2017-12-01 LAB — VITAMIN D 25 HYDROXY (VIT D DEFICIENCY, FRACTURES): Vit D, 25-Hydroxy: 38.5 ng/mL (ref 30.0–100.0)

## 2017-12-01 LAB — PTH-RELATED PEPTIDE

## 2017-12-03 DIAGNOSIS — Z1231 Encounter for screening mammogram for malignant neoplasm of breast: Secondary | ICD-10-CM | POA: Diagnosis not present

## 2017-12-18 ENCOUNTER — Other Ambulatory Visit: Payer: Self-pay | Admitting: Family Medicine

## 2017-12-19 ENCOUNTER — Telehealth: Payer: Self-pay

## 2017-12-19 NOTE — Telephone Encounter (Signed)
Pt called office and LMOVM requesting to reschedule TCS w/SLF that was for 01/25/18. She needs morning appt d/t her ride. Called pt, TCS rescheduled to 02/07/18 at 8:30am. New instructions mailed. LMOVM and informed endo scheduler.

## 2017-12-27 ENCOUNTER — Other Ambulatory Visit: Payer: Self-pay | Admitting: Family Medicine

## 2017-12-28 LAB — UIFE/LIGHT CHAINS/TP QN, 24-HR UR
FR KAPPA LT CH,24HR: 2 mg/24 hr
FR LAMBDA LT CH,24HR: 0 mg/(24.h)
Free Kappa Lt Chains,Ur: 0.93 mg/L — ABNORMAL LOW (ref 1.35–24.19)
Free Lambda Lt Chains,Ur: 0.08 mg/L — ABNORMAL LOW (ref 0.24–6.66)
Kappa/Lambda Ratio,U: 11.63 — ABNORMAL HIGH (ref 2.04–10.37)
PROTEIN 24H UR: 120 mg/(24.h) (ref 30–150)
PROTEIN UR: 4.8 mg/dL

## 2017-12-28 LAB — PROTEIN ELECTROPHORESIS, SERUM, WITH REFLEX
A/G Ratio: 1 (ref 0.7–1.7)
ALPHA 2: 0.7 g/dL (ref 0.4–1.0)
Albumin ELP: 3.5 g/dL (ref 2.9–4.4)
Alpha 1: 0.2 g/dL (ref 0.0–0.4)
Beta: 1.2 g/dL (ref 0.7–1.3)
GLOBULIN, TOTAL: 3.4 g/dL (ref 2.2–3.9)
Gamma Globulin: 1.3 g/dL (ref 0.4–1.8)
TOTAL PROTEIN: 6.9 g/dL (ref 6.0–8.5)

## 2017-12-28 LAB — TSH: TSH: 0.939 u[IU]/mL (ref 0.450–4.500)

## 2017-12-28 LAB — CALCIUM: CALCIUM: 10.5 mg/dL — AB (ref 8.7–10.3)

## 2018-01-03 ENCOUNTER — Encounter: Payer: Self-pay | Admitting: Family Medicine

## 2018-01-03 ENCOUNTER — Ambulatory Visit (INDEPENDENT_AMBULATORY_CARE_PROVIDER_SITE_OTHER): Payer: Medicare Other | Admitting: Family Medicine

## 2018-01-03 VITALS — BP 132/70 | Temp 98.8°F | Ht 66.0 in | Wt 179.0 lb

## 2018-01-03 DIAGNOSIS — R899 Unspecified abnormal finding in specimens from other organs, systems and tissues: Secondary | ICD-10-CM

## 2018-01-03 DIAGNOSIS — R300 Dysuria: Secondary | ICD-10-CM

## 2018-01-03 DIAGNOSIS — M545 Low back pain, unspecified: Secondary | ICD-10-CM

## 2018-01-03 LAB — POCT URINALYSIS DIPSTICK
Spec Grav, UA: 1.01 (ref 1.010–1.025)
Urobilinogen, UA: 1 E.U./dL
pH, UA: 8 (ref 5.0–8.0)

## 2018-01-03 MED ORDER — CEPHALEXIN 500 MG PO CAPS: 500.0000 mg | ORAL_CAPSULE | Freq: Four times a day (QID) | ORAL | 0 refills | Status: DC

## 2018-01-03 NOTE — Patient Instructions (Signed)
May use Loratidine 10mg   one a day  30 days for 4 dollars at [facility] over the counter

## 2018-01-03 NOTE — Addendum Note (Signed)
Addended by: Karle Barr on: 01/03/2018 11:55 AM   Modules accepted: Orders

## 2018-01-03 NOTE — Progress Notes (Signed)
   Subjective:    Patient ID: Melissa Barajas, female    DOB: 02-Jul-1941, 77 y.o.   MRN: [id number]  HPIpt arrives today to follow up on elevated calcium.  On her last blood work approximately 6 months ago calcium slightly elevated on follow-up is still elevated multiple other testing is pointing toward the possibility of hematologic issue will need further evaluation work-up  Lower back pain. Started this morning. Stinging when urinating this morning.  Relates urinary frequency.  Having to clear throat a lot. Started January.  Denies any discolored phlegm denies any hemoptysis does have some regurgitation issues but she sleeps on an incline and uses PPI  Results for orders placed or performed in visit on 01/03/18  POCT urinalysis dipstick  Result Value Ref Range   Color, UA     Clarity, UA     Glucose, UA     Bilirubin, UA     Ketones, UA     Spec Grav, UA 1.010 1.010 - 1.025   Blood, UA     pH, UA 8.0 5.0 - 8.0   Protein, UA     Urobilinogen, UA 1.0 0.2 or 1.0 E.U./dL   Nitrite, UA     Leukocytes, UA Trace (A) Negative   Appearance     Odor        Review of Systems  Constitutional: Negative for activity change, fatigue and fever.  HENT: Negative for congestion.   Respiratory: Negative for cough, chest tightness and shortness of breath.   Cardiovascular: Negative for chest pain and leg swelling.  Gastrointestinal: Negative for abdominal pain.  Skin: Negative for color change.  Neurological: Negative for headaches.  Psychiatric/Behavioral: Negative for behavioral problems.       Objective:   Physical Exam  Constitutional: She appears well-developed and well-nourished. No distress.  HENT:  Head: Normocephalic and atraumatic.  Eyes: Right eye exhibits no discharge. Left eye exhibits no discharge.  Neck: No tracheal deviation present.  Cardiovascular: Normal rate, regular rhythm and normal heart sounds.  No murmur heard. Pulmonary/Chest: Effort normal and breath sounds  normal. No respiratory distress. She has no wheezes. She has no rales.  Musculoskeletal: She exhibits no edema.  Lymphadenopathy:    She has no cervical adenopathy.  Neurological: She is alert. She exhibits normal muscle tone.  Skin: Skin is warm and dry. No erythema.  Psychiatric: Her behavior is normal.  Vitals reviewed.         Assessment & Plan:  Elevated calcium- urine electrophoresis slightly abnormal.  Serum electrophoresis normal.  Work-up so far points toward the need to see hematology to make sure patient is not developing multiple myeloma.  Patient was advised to avoid all calcium  Dysuria we will check urine culture antibiotics for 5 days  Postnasal drainage environmental no intervention other than loratadine  Follow-up 4 to 6 months

## 2018-01-04 NOTE — Progress Notes (Signed)
Done

## 2018-01-05 LAB — URINE CULTURE

## 2018-01-07 ENCOUNTER — Ambulatory Visit (INDEPENDENT_AMBULATORY_CARE_PROVIDER_SITE_OTHER): Payer: Medicare Other | Admitting: Otolaryngology

## 2018-01-11 ENCOUNTER — Encounter: Payer: Self-pay | Admitting: Family Medicine

## 2018-01-14 ENCOUNTER — Encounter (HOSPITAL_COMMUNITY): Payer: Self-pay | Admitting: Emergency Medicine

## 2018-01-14 ENCOUNTER — Other Ambulatory Visit: Payer: Self-pay | Admitting: Family Medicine

## 2018-02-01 ENCOUNTER — Inpatient Hospital Stay (HOSPITAL_COMMUNITY): Payer: Medicare Other | Attending: Hematology | Admitting: Hematology

## 2018-02-01 ENCOUNTER — Other Ambulatory Visit: Payer: Self-pay

## 2018-02-01 ENCOUNTER — Encounter (HOSPITAL_COMMUNITY): Payer: Self-pay | Admitting: Hematology

## 2018-02-01 ENCOUNTER — Inpatient Hospital Stay (HOSPITAL_COMMUNITY): Payer: Medicare Other

## 2018-02-01 DIAGNOSIS — I10 Essential (primary) hypertension: Secondary | ICD-10-CM | POA: Diagnosis not present

## 2018-02-01 NOTE — Assessment & Plan Note (Addendum)
1.  Mild hypercalcemia: - She has mild hypercalcemia ranging from 10.5-10.8 from August 2018.  She reports having some kind of surgery on parathyroids about 25 years ago.  I do not have any records of it. -Excellent work-up done by Dr. Wolfgang Phoenix was negative for primary hyperparathyroidism.  PTH related peptide and TSH were also normal.  25-hydroxy vitamin D was normal.  SPEP was negative.  24-hour urine showed some elevation of free light chain ratio.  I would order serum immunofixation and free light chain assay.  This is to look for nonsecretory myeloma/light chain disease. -I have reviewed her medication list.  Indapamide can cause mild hypercalcemia.  I have told her to discontinue it right away and we will check a repeat calcium level at next visit in 3 to 4 weeks.  She will contact Dr. Wolfgang Phoenix for an alternative to thiazide diuretics.  I think the likelihood of malignancy is very low.

## 2018-02-01 NOTE — Progress Notes (Signed)
[facility] CONSULT NOTE  Patient Care Team: Kathyrn Drown, MD as PCP - General (Family Medicine) Danie Binder, MD as Attending Physician (Gastroenterology)  CHIEF COMPLAINTS/PURPOSE OF CONSULTATION:  Hypercalcemia  HISTORY OF PRESENTING ILLNESS:  Melissa Barajas 77 y.o. female here for initial consultation for hypercalcemia, referred by her PCP, Dr. Sallee Lange.   Chart reviewed.  Noted to have mild hypercalcemia dating back to at least 04/2017.  Serum calcium levels have been in the 10.5-10.8 range. In 11/2017, intact PTH normal at 25; vitamin D level normal at 38.5. Most recent serum calcium 10.5 on 12/26/17.  SPEP was done by PCP on 12/26/17 and showed no M-spike and no apparent monoclonal protein.  Urine protein electrophoresis revealed elevated kappa/lambda light chain ratio of 11.63.  24-hour urine protein was normal.    Here today unaccompanied.   Reports that she has been taking vitamin D since 10/2017 when she saw Dr. Wolfgang Phoenix.  She is also taking indapamide, which she has been on for about 1 year.  Her BP is generally well-controlled at home.    Denies any new pain. She does feel like she is a bit more tired than is her normal activity level. Notes that she is having to rest more often than she is used to.  Denies fevers, night sweats, or weight loss in past 6 months.  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   In the past, she was told that she had hyperparathyroidism; her parathyroid was removed per her report (this was many years ago and she is not sure how much of parathyroid was actually removed). She was having hoarse voice and neck pain that precipitated this finding in the past per her report.   She thinks this was 25-30 years ago.   Denies any personal history of cancer.   Oncologic Family History:  -Brother: cancer, unknown type; now deceased -Brother: penile cancer; now deceased No known family h/o hematologic  malignancies.   She lives alone and performs all ADLs independently.  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  Prior to that, she worked in a [employer], newspaper.  She may have had some chemical exposure that was used to clean the machines in the plant with; she thinks it was called "hexane."    History of smoking; she quit 20+ years ago.  She smoked for about 15 years; smoked 1.5 packs per week.    MEDICAL HISTORY:  Past Medical History:  Diagnosis Date  . Arthritis   . Atrial fibrillation ([place])   . Collagen vascular disease ([place])   . DIVERTICULOSIS OF COLON 08/25/2010   Qualifier: Diagnosis of  By: Hoy Morn    . GERD (gastroesophageal reflux disease)   . HEMORRHOIDS, INTERNAL 08/25/2010   Qualifier: Diagnosis of  By: Hoy Morn    . Hypercholesteremia   . Hypertension   . Lipoma of arm    Left    SURGICAL HISTORY: Past Surgical History:  Procedure Laterality Date  . ABDOMINAL HYSTERECTOMY     Partial  . COLONOSCOPY  2009   SLF: 1. screening colonoscopy in 10 years 2. She should follow a high fiber diet She has been given a hand out on high fiber diverticulosois and hemorrhoids  . CYSTECTOMY     Left breast  . ESOPHAGOGASTRODUODENOSCOPY     [facility]:ENIDPOEUMPN appearing Schatzki's ring, possible cervical esophageal web, both dilated with passage of  a Maloney dilator/small hiatal hernia  . ESOPHAGOGASTRODUODENOSCOPY N/A 10/25/2015   Procedure: ESOPHAGOGASTRODUODENOSCOPY (EGD);  Surgeon: Danie Binder, MD;  [facility];  Service: Endoscopy;  Laterality: N/A;  1215pm  . SAVORY DILATION N/A 10/25/2015   Procedure: SAVORY DILATION;  Surgeon: Danie Binder, MD;  Location: [facility];  Service: Endoscopy;  Laterality: N/A;  . THYROIDECTOMY    . Tumor   05/2010   Left breast under nipple  . VESICOVAGINAL FISTULA CLOSURE W/ TAH     fibroid tumors    SOCIAL HISTORY: Social History   Socioeconomic History  . Marital status:  Married    Spouse name: Not on file  . Number of children: Not on file  . Years of education: Not on file  . Highest education level: Not on file  Occupational History  . Not on file  Social Needs  . Financial resource strain: Not on file  . Food insecurity:    Worry: Not on file    Inability: Not on file  . Transportation needs:    Medical: Not on file    Non-medical: Not on file  Tobacco Use  . Smoking status: Former Smoker    Start date: 09/12/1983    Last attempt to quit: 09/11/1993    Years since quitting: 24.4  . Smokeless tobacco: Never Used  Substance and Sexual Activity  . Alcohol use: No    Alcohol/week: 0.0 oz  . Drug use: No  . Sexual activity: Not on file  Lifestyle  . Physical activity:    Days per week: Not on file    Minutes per session: Not on file  . Stress: Not on file  Relationships  . Social connections:    Talks on phone: Not on file    Gets together: Not on file    Attends religious service: Not on file    Active member of club or organization: Not on file    Attends meetings of clubs or organizations: Not on file    Relationship status: Not on file  . Intimate partner violence:    Fear of current or ex partner: Not on file    Emotionally abused: Not on file    Physically abused: Not on file    Forced sexual activity: Not on file  Other Topics Concern  . Not on file  Social History Narrative  . Not on file    FAMILY HISTORY: Family History  Problem Relation Age of Onset  . Cancer Brother        Prostate    ALLERGIES:  is allergic to aricept [donepezil hcl]; hydrocodone; lisinopril; naprosyn [naproxen]; and vioxx [rofecoxib].  MEDICATIONS:  Current Outpatient Medications  Medication Sig Dispense Refill  . acetaminophen (TYLENOL) 500 MG tablet Take 1,000 mg by mouth 2 (two) times daily as needed for moderate pain or headache.    Marland Kitchen CARTIA XT 300 MG 24 hr capsule TAKE 1 CAPSULE BY MOUTH EVERY DAY 30 capsule 5  . cholecalciferol (VITAMIN  D) 1000 units tablet Take 1,000 Units by mouth daily.    . indapamide (LOZOL) 2.5 MG tablet TAKE 1 TABLET(2.5 MG) BY MOUTH DAILY 90 tablet 1  . omeprazole (PRILOSEC) 20 MG capsule Take 20 mg by mouth daily.    Marland Kitchen OVER THE COUNTER MEDICATION Take 4 tablets by mouth daily. Focus factor     . Polyethyl Glycol-Propyl Glycol (SYSTANE OP) Place 2 drops into both eyes daily.    . potassium chloride (K-DUR) 10  MEQ tablet TAKE 1 TABLET(10 MEQ) BY MOUTH TWICE DAILY 180 tablet 1  . pravastatin (PRAVACHOL) 80 MG tablet TAKE 1 TABLET(80 MG) BY MOUTH DAILY 90 tablet 1  . Probiotic Product (PROBIOTIC DAILY PO) Take 1 tablet by mouth daily.     . temazepam (RESTORIL) 30 MG capsule TAKE ONE CAPSULE BY MOUTH AT BEDTIME 90 capsule 1  . amoxicillin (AMOXIL) 500 MG capsule Take 2,000 mg by mouth See admin instructions. Take 1 hour prior to dental procedure    . Na Sulfate-K Sulfate-Mg Sulf (SUPREP BOWEL PREP KIT) 17.5-3.13-1.6 GM/177ML SOLN Take 1 kit by mouth as directed. (Patient not taking: Reported on 02/01/2018) 1 Bottle 0   No current facility-administered medications for this visit.     REVIEW OF SYSTEMS:   Constitutional: Denies fevers, chills or abnormal night sweats.  Mild fatigue. Eyes: Denies blurriness of vision, double vision or watery eyes Ears, nose, mouth, throat, and face: Denies mucositis or sore throat Respiratory: Denies cough, dyspnea or wheezes Cardiovascular: Denies palpitation, chest discomfort or lower extremity swelling Gastrointestinal:  Denies nausea, heartburn or change in bowel habits Skin: Denies abnormal skin rashes Lymphatics: Denies new lymphadenopathy or easy bruising Neurological:Denies numbness, tingling or new weaknesses Behavioral/Psych: Mood is stable, no new changes  All other systems were reviewed with the patient and are negative.  PHYSICAL EXAMINATION: ECOG PERFORMANCE STATUS: 1 - Symptomatic but completely ambulatory  Vitals:   02/01/18 1117  BP: (!) 159/61   Pulse: 65  Resp: 16  Temp: 98.3 F (36.8 C)  SpO2: 97%   Filed Weights   02/01/18 1117  Weight: 182 lb (82.6 kg)    GENERAL:alert, no distress and comfortable SKIN: skin color, texture, turgor are normal, no rashes or significant lesions EYES: normal, conjunctiva are pink and non-injected, sclera clear OROPHARYNX:no exudate, no erythema and lips, buccal mucosa, and tongue normal  NECK: supple, thyroid normal size, non-tender, without nodularity.  Scar above the sternal notch from previous surgery. LYMPH:  no palpable lymphadenopathy in the cervical, axillary or inguinal LUNGS: clear to auscultation and percussion with normal breathing effort HEART: regular rate & rhythm and no murmurs and no lower extremity edema ABDOMEN:abdomen soft, non-tender and normal bowel sounds Musculoskeletal:no cyanosis of digits and no clubbing  PSYCH: alert & oriented x 3 with fluent speech   LABORATORY DATA:  I have reviewed the data as listed Recent Results (from the past 2160 hour(s))  Potassium     Status: None   Collection Time: 11/16/17  9:03 AM  Result Value Ref Range   Potassium 3.8 3.5 - 5.2 mmol/L  PTH, Intact and Calcium     Status: Abnormal   Collection Time: 11/16/17  9:03 AM  Result Value Ref Range   Calcium 10.5 (H) 8.7 - 10.3 mg/dL   PTH 25 15 - 65 pg/mL   PTH Interp Comment     Comment: Interpretation                 Intact PTH    Calcium                                 (pg/mL)      (mg/dL) Normal                          15 - 65     8.6 - 10.2 Primary Hyperparathyroidism         >  65          >10.2 Secondary Hyperparathyroidism       >65          <10.2 Non-Parathyroid Hypercalcemia       <65          >10.2 Hypoparathyroidism                  <15          < 8.6 Non-Parathyroid Hypocalcemia    15 - 65          < 8.6   PTH-Related Peptide     Status: None   Collection Time: 11/20/17 10:24 AM  Result Value Ref Range   PTH-related peptide <2.0 pmol/L    Comment: Reference  Range: All Ages: <2.0 The PTHrP assay should not be used to exclude cancer or screen tumor patients for humoral hypercalcemia of malignancy (HHM). The results should always be assessed in conjunction with the patient's medical history, clinical examination, and other findings. If test results are clinically discordant, please contact the laboratory.   VITAMIN D 25 Hydroxy (Vit-D Deficiency, Fractures)     Status: None   Collection Time: 11/20/17 10:24 AM  Result Value Ref Range   Vit D, 25-Hydroxy 38.5 30.0 - 100.0 ng/mL    Comment: Vitamin D deficiency has been defined by the [place] practice guideline as a level of serum 25-OH vitamin D less than 20 ng/mL (1,2). The Endocrine Society went on to further define vitamin D insufficiency as a level between 21 and 29 ng/mL (2). 1. IOM (Institute of Medicine). 2010. Dietary reference    intakes for calcium and D. [place]: The    [employer]. 2. Holick MF, Binkley , Bischoff-Ferrari HA, et al.    Evaluation, treatment, and prevention of vitamin D    deficiency: an Endocrine Society clinical practice    guideline. JCEM. 2011 Jul; 96(7):1911-30.   Vitamin D 1,25 dihydroxy     Status: None   Collection Time: 11/20/17 10:24 AM  Result Value Ref Range   Vitamin D 1, 25 (OH)2 Total 48 pg/mL    Comment: Reference Range: Adults: 21 - 65    Vitamin D2 1, 25 (OH)2 <10 pg/mL   Vitamin D3 1, 25 (OH)2 46 pg/mL  Protein electrophoresis, urine     Status: Abnormal   Collection Time: 12/26/17  8:41 AM  Result Value Ref Range   Free Kappa Lt Chains,Ur 0.93 (L) 1.35 - 24.19 mg/L   FR KAPPA LT CH,24HR 2 Undefined mg/24 hr   Free Lambda Lt Chains,Ur 0.08 (L) 0.24 - 6.66 mg/L   FR LAMBDA LT CH,24HR 0 Undefined mg/24 hr   Kappa/Lambda Ratio,U 11.63 (H) 2.04 - 10.37   IFE Interpretation:U Comment     Comment: An apparent normal immunofixation pattern.   Protein, Ur 4.8 Not Estab. mg/dL    Protein, 24H Urine 120 30 - 150 mg/24 hr  Serum protein electrophoresis with reflex     Status: None   Collection Time: 12/26/17  8:41 AM  Result Value Ref Range   Total Protein 6.9 6.0 - 8.5 g/dL   Albumin ELP 3.5 2.9 - 4.4 g/dL   Alpha 1 0.2 0.0 - 0.4 g/dL   Alpha 2 0.7 0.4 - 1.0 g/dL   Beta 1.2 0.7 - 1.3 g/dL   Gamma Globulin 1.3 0.4 - 1.8 g/dL   M-Spike, % Not Observed Not Observed g/dL   GLOBULIN,  TOTAL 3.4 2.2 - 3.9 g/dL   A/G Ratio 1.0 0.7 - 1.7   Please Note: Comment     Comment: Protein electrophoresis scan will follow via computer, mail, or courier delivery.    Interpretation(See Below) Comment     Comment: The SPE pattern appears essentially unremarkable. Evidence of monoclonal protein is not apparent.   TSH     Status: None   Collection Time: 12/26/17  8:41 AM  Result Value Ref Range   TSH 0.939 0.450 - 4.500 uIU/mL  Calcium     Status: Abnormal   Collection Time: 12/26/17  8:41 AM  Result Value Ref Range   Calcium 10.5 (H) 8.7 - 10.3 mg/dL  Urine Culture     Status: None   Collection Time: 01/03/18 12:00 AM  Result Value Ref Range   Urine Culture, Routine Final report    Organism ID, Bacteria Comment     Comment: Mixed urogenital flora Less than 10,000 colonies/mL   POCT urinalysis dipstick     Status: Abnormal   Collection Time: 01/03/18  9:29 AM  Result Value Ref Range   Color, UA     Clarity, UA     Glucose, UA     Bilirubin, UA     Ketones, UA     Spec Grav, UA 1.010 1.010 - 1.025   Blood, UA     pH, UA 8.0 5.0 - 8.0   Protein, UA     Urobilinogen, UA 1.0 0.2 or 1.0 E.U./dL   Nitrite, UA     Leukocytes, UA Trace (A) Negative   Appearance     Odor       ASSESSMENT & PLAN:  Hypercalcemia 1.  Mild hypercalcemia: - She has mild hypercalcemia ranging from 10.5-10.8 from August 2018.  She reports having some kind of surgery on parathyroids about 25 years ago.  I do not have any records of it. -Excellent work-up done by Dr. Wolfgang Phoenix was negative  for primary hyperparathyroidism.  PTH related peptide and TSH were also normal.  25-hydroxy vitamin D was normal.  SPEP was negative.  24-hour urine showed some elevation of free light chain ratio.  I would order serum immunofixation and free light chain assay.  This is to look for nonsecretory myeloma/light chain disease. -I have reviewed her medication list.  Indapamide can cause mild hypercalcemia.  I have told her to discontinue it right away and we will check a repeat calcium level at next visit in 3 to 4 weeks.  She will contact Dr. Wolfgang Phoenix for an alternative to thiazide diuretics.  I think the likelihood of malignancy is very low.     All questions were answered. The patient knows to call the clinic with any problems, questions or concerns.   This note includes documentation from Mike Craze, NP, who was present during this patient's office visit and evaluation.  I have reviewed this note for its completeness and accuracy.  I have edited this note accordingly based on my findings and medical opinion.     Derek Jack, MD 02/01/18 5:18 PM

## 2018-02-05 LAB — KAPPA/LAMBDA LIGHT CHAINS
KAPPA FREE LGHT CHN: 25.5 mg/L — AB (ref 3.3–19.4)
KAPPA, LAMDA LIGHT CHAIN RATIO: 1.76 — AB (ref 0.26–1.65)
LAMDA FREE LIGHT CHAINS: 14.5 mg/L (ref 5.7–26.3)

## 2018-02-06 LAB — IMMUNOFIXATION ELECTROPHORESIS
IGA: 212 mg/dL (ref 64–422)
IGM (IMMUNOGLOBULIN M), SRM: 38 mg/dL (ref 26–217)
IgG (Immunoglobin G), Serum: 1432 mg/dL (ref 700–1600)
Total Protein ELP: 6.9 g/dL (ref 6.0–8.5)

## 2018-02-07 ENCOUNTER — Encounter (HOSPITAL_COMMUNITY): Payer: Self-pay | Admitting: *Deleted

## 2018-02-07 ENCOUNTER — Ambulatory Visit (HOSPITAL_COMMUNITY)
Admission: RE | Admit: 2018-02-07 | Discharge: 2018-02-07 | Disposition: A | Discharge status: Home / Self Care | Payer: Medicare Other | Source: Ambulatory Visit | Attending: Gastroenterology | Admitting: Gastroenterology

## 2018-02-07 ENCOUNTER — Other Ambulatory Visit: Payer: Self-pay

## 2018-02-07 ENCOUNTER — Encounter (HOSPITAL_COMMUNITY)
Admission: RE | Disposition: A | Discharge status: Home / Self Care | Payer: Self-pay | Source: Ambulatory Visit | Attending: Gastroenterology

## 2018-02-07 DIAGNOSIS — I4891 Unspecified atrial fibrillation: Secondary | ICD-10-CM | POA: Insufficient documentation

## 2018-02-07 DIAGNOSIS — K5730 Diverticulosis of large intestine without perforation or abscess without bleeding: Secondary | ICD-10-CM | POA: Diagnosis not present

## 2018-02-07 DIAGNOSIS — D124 Benign neoplasm of descending colon: Secondary | ICD-10-CM | POA: Diagnosis not present

## 2018-02-07 DIAGNOSIS — K648 Other hemorrhoids: Secondary | ICD-10-CM | POA: Insufficient documentation

## 2018-02-07 DIAGNOSIS — K644 Residual hemorrhoidal skin tags: Secondary | ICD-10-CM | POA: Diagnosis not present

## 2018-02-07 DIAGNOSIS — K625 Hemorrhage of anus and rectum: Secondary | ICD-10-CM

## 2018-02-07 DIAGNOSIS — Z886 Allergy status to analgesic agent status: Secondary | ICD-10-CM | POA: Diagnosis not present

## 2018-02-07 DIAGNOSIS — Z87891 Personal history of nicotine dependence: Secondary | ICD-10-CM | POA: Diagnosis not present

## 2018-02-07 DIAGNOSIS — K635 Polyp of colon: Secondary | ICD-10-CM | POA: Insufficient documentation

## 2018-02-07 DIAGNOSIS — Z888 Allergy status to other drugs, medicaments and biological substances status: Secondary | ICD-10-CM | POA: Diagnosis not present

## 2018-02-07 DIAGNOSIS — E7800 Pure hypercholesterolemia, unspecified: Secondary | ICD-10-CM | POA: Insufficient documentation

## 2018-02-07 DIAGNOSIS — I10 Essential (primary) hypertension: Secondary | ICD-10-CM | POA: Insufficient documentation

## 2018-02-07 DIAGNOSIS — K219 Gastro-esophageal reflux disease without esophagitis: Secondary | ICD-10-CM | POA: Diagnosis not present

## 2018-02-07 DIAGNOSIS — K921 Melena: Secondary | ICD-10-CM

## 2018-02-07 DIAGNOSIS — D125 Benign neoplasm of sigmoid colon: Secondary | ICD-10-CM | POA: Insufficient documentation

## 2018-02-07 DIAGNOSIS — D122 Benign neoplasm of ascending colon: Secondary | ICD-10-CM | POA: Diagnosis not present

## 2018-02-07 DIAGNOSIS — Z79899 Other long term (current) drug therapy: Secondary | ICD-10-CM | POA: Insufficient documentation

## 2018-02-07 HISTORY — PX: COLONOSCOPY: SHX5424

## 2018-02-07 HISTORY — PX: POLYPECTOMY: SHX5525

## 2018-02-07 SURGERY — COLONOSCOPY
Anesthesia: Moderate Sedation

## 2018-02-07 MED ORDER — MIDAZOLAM HCL 5 MG/5ML IJ SOLN
INTRAMUSCULAR | Status: DC | PRN
  Administered 2018-02-07: 1 mg via INTRAVENOUS
  Administered 2018-02-07 (×2): 2 mg via INTRAVENOUS

## 2018-02-07 MED ORDER — MIDAZOLAM HCL 5 MG/5ML IJ SOLN
INTRAMUSCULAR | Status: AC
  Filled 2018-02-07: qty 10

## 2018-02-07 MED ORDER — SODIUM CHLORIDE 0.9 % IV SOLN
INTRAVENOUS | Status: DC
  Administered 2018-02-07: 08:00:00 via INTRAVENOUS

## 2018-02-07 MED ORDER — MEPERIDINE HCL 100 MG/ML IJ SOLN
INTRAMUSCULAR | Status: AC
  Filled 2018-02-07: qty 2

## 2018-02-07 MED ORDER — MEPERIDINE HCL 100 MG/ML IJ SOLN
INTRAMUSCULAR | Status: DC | PRN
  Administered 2018-02-07: 25 mg via INTRAVENOUS
  Administered 2018-02-07: 50 mg via INTRAVENOUS

## 2018-02-07 NOTE — H&P (Signed)
Primary Care Physician:  Kathyrn Drown, MD Primary Gastroenterologist:  Dr. Oneida Alar  Pre-Procedure History & Physical: HPI:  Melissa Barajas is a 77 y.o. female here for  BRBPR.   Past Medical History:  Diagnosis Date  . Arthritis   . Atrial fibrillation ([place])   . Collagen vascular disease ([place])   . DIVERTICULOSIS OF COLON 08/25/2010   Qualifier: Diagnosis of  By: Hoy Morn    . GERD (gastroesophageal reflux disease)   . HEMORRHOIDS, INTERNAL 08/25/2010   Qualifier: Diagnosis of  By: Hoy Morn    . Hypercholesteremia   . Hypertension   . Lipoma of arm    Left    Past Surgical History:  Procedure Laterality Date  . ABDOMINAL HYSTERECTOMY     Partial  . COLONOSCOPY  2009   SLF: 1. screening colonoscopy in 10 years 2. She should follow a high fiber diet She has been given a hand out on high fiber diverticulosois and hemorrhoids  . CYSTECTOMY     Left breast  . ESOPHAGOGASTRODUODENOSCOPY     [facility]:INOMVEHMCNO appearing Schatzki's ring, possible cervical esophageal web, both dilated with passage of a Maloney dilator/small hiatal hernia  . ESOPHAGOGASTRODUODENOSCOPY N/A 10/25/2015   Procedure: ESOPHAGOGASTRODUODENOSCOPY (EGD);  Surgeon: Danie Binder, MD;  [facility];  Service: Endoscopy;  Laterality: N/A;  1215pm  . SAVORY DILATION N/A 10/25/2015   Procedure: SAVORY DILATION;  Surgeon: Danie Binder, MD;  [facility];  Service: Endoscopy;  Laterality: N/A;  . THYROIDECTOMY    . Tumor   05/2010   Left breast under nipple  . VESICOVAGINAL FISTULA CLOSURE W/ TAH     fibroid tumors    Prior to Admission medications   Medication Sig Start Date End Date Taking? Authorizing Provider  CARTIA XT 300 MG 24 hr capsule TAKE 1 CAPSULE BY MOUTH EVERY DAY 12/27/17  Yes Kathyrn Drown, MD  cholecalciferol (VITAMIN D) 1000 units tablet Take 1,000 Units by mouth daily.   Yes [provider]  indapamide (LOZOL) 2.5 MG tablet TAKE 1 TABLET(2.5 MG) BY  MOUTH DAILY 01/14/18  Yes Luking, Scott A, MD  Na Sulfate-K Sulfate-Mg Sulf (SUPREP BOWEL PREP KIT) 17.5-3.13-1.6 GM/177ML SOLN Take 1 kit by mouth as directed. 11/29/17  Yes Brooks Kinnan L, MD  omeprazole (PRILOSEC) 20 MG capsule Take 20 mg by mouth daily.   Yes [provider]  OVER THE COUNTER MEDICATION Take 4 tablets by mouth daily. Focus factor    Yes [provider]  Polyethyl Glycol-Propyl Glycol (SYSTANE OP) Place 2 drops into both eyes daily.   Yes [provider]  potassium chloride (K-DUR) 10 MEQ tablet TAKE 1 TABLET(10 MEQ) BY MOUTH TWICE DAILY 12/27/17  Yes Luking, Scott A, MD  pravastatin (PRAVACHOL) 80 MG tablet TAKE 1 TABLET(80 MG) BY MOUTH DAILY 01/14/18  Yes Luking, Elayne Snare, MD  Probiotic Product (PROBIOTIC DAILY PO) Take 1 tablet by mouth daily.    Yes [provider]  temazepam (RESTORIL) 30 MG capsule TAKE ONE CAPSULE BY MOUTH AT BEDTIME 10/31/17  Yes Luking, Scott A, MD  acetaminophen (TYLENOL) 500 MG tablet Take 1,000 mg by mouth 2 (two) times daily as needed for moderate pain or headache.    [provider]  amoxicillin (AMOXIL) 500 MG capsule Take 2,000 mg by mouth See admin instructions. Take 1 hour prior to dental procedure    [provider]    Allergies as of 11/29/2017 - Review Complete 11/29/2017  Allergen Reaction Noted  . Aricept [donepezil hcl]  11/20/2017  . Hydrocodone Nausea Only 01/08/2013  . Lisinopril  01/08/2013  . Naprosyn [naproxen]  04/06/2016  . Vioxx [rofecoxib]  01/08/2013    Family History  Problem Relation Age of Onset  . Cancer Brother        Prostate    Social History   Socioeconomic History  . Marital status: Married    Spouse name: Not on file  . Number of children: Not on file  . Years of education: Not on file  . Highest education level: Not on file  Occupational History  . Not on file  Social Needs  . Financial resource strain: Not on file  . Food insecurity:    Worry: Not  on file    Inability: Not on file  . Transportation needs:    Medical: Not on file    Non-medical: Not on file  Tobacco Use  . Smoking status: Former Smoker    Start date: 09/12/1983    Last attempt to quit: 09/11/1993    Years since quitting: 24.4  . Smokeless tobacco: Never Used  Substance and Sexual Activity  . Alcohol use: No    Alcohol/week: 0.0 oz  . Drug use: No  . Sexual activity: Not on file  Lifestyle  . Physical activity:    Days per week: Not on file    Minutes per session: Not on file  . Stress: Not on file  Relationships  . Social connections:    Talks on phone: Not on file    Gets together: Not on file    Attends religious service: Not on file    Active member of club or organization: Not on file    Attends meetings of clubs or organizations: Not on file    Relationship status: Not on file  . Intimate partner violence:    Fear of current or ex partner: Not on file    Emotionally abused: Not on file    Physically abused: Not on file    Forced sexual activity: Not on file  Other Topics Concern  . Not on file  Social History Narrative  . Not on file    Review of Systems: See HPI, otherwise negative ROS   Physical Exam: BP (!) 145/80   Pulse 67   Temp 98 F (36.7 C) (Oral)   Resp 17   Ht 5' 6"  (1.676 m)   Wt 182 lb (82.6 kg)   SpO2 98%   BMI 29.38 kg/m  General:   Alert,  pleasant and cooperative in NAD Head:  Normocephalic and atraumatic. Neck:  Supple; Lungs:  Clear throughout to auscultation.    Heart:  Regular rate and rhythm. Abdomen:  Soft, nontender and nondistended. Normal bowel sounds, without guarding, and without rebound.   Neurologic:  Alert and  oriented x4;  grossly normal neurologically.  Impression/Plan:    BRBPR  PLAN: TCS TODAY DISCUSSED PROCEDURE, BENEFITS, & RISKS: < 1% chance of medication reaction, bleeding, perforation, or rupture of spleen/liver.

## 2018-02-07 NOTE — Discharge Instructions (Signed)
Your rectal bleeding is due to small internal hemorrhoids and you have diverticulosis IN YOUR LEFT COLON. YOU HAD THREE SMALL POLYPS REMOVED.    DRINK WATER TO KEEP YOUR URINE LIGHT YELLOW.  FOLLOW A HIGH FIBER DIET. AVOID ITEMS THAT CAUSE BLOATING. See info below.  YOUR BIOPSY RESULTS WILL BE AVAILABLE IN 7 DAYS.  USE PREPARATION H FOUR TIMES  A DAY IF NEEDED TO RELIEVE RECTAL PAIN/PRESSURE/BLEEDING.  FOLLOW UP IN 3 MOS.    Colonoscopy Care After Read the instructions outlined below and refer to this sheet in the next week. These discharge instructions provide you with general information on caring for yourself after you leave the hospital. While your treatment has been planned according to the most current medical practices available, unavoidable complications occasionally occur. If you have any problems or questions after discharge, call DR. Jathen Sudano, [phone].  ACTIVITY  You may resume your regular activity, but move at a slower pace for the next 24 hours.   Take frequent rest periods for the next 24 hours.   Walking will help get rid of the air and reduce the bloated feeling in your belly (abdomen).   No driving for 24 hours (because of the medicine (anesthesia) used during the test).   You may shower.   Do not sign any important legal documents or operate any machinery for 24 hours (because of the anesthesia used during the test).    NUTRITION  Drink plenty of fluids.   You may resume your normal diet as instructed by your doctor.   Begin with a light meal and progress to your normal diet. Heavy or fried foods are harder to digest and may make you feel sick to your stomach (nauseated).   Avoid alcoholic beverages for 24 hours or as instructed.    MEDICATIONS  You may resume your normal medications.   WHAT YOU CAN EXPECT TODAY  Some feelings of bloating in the abdomen.   Passage of more gas than usual.   Spotting of blood in your stool or on the toilet  paper  .  IF YOU HAD POLYPS REMOVED DURING THE COLONOSCOPY:  Eat a soft diet IF YOU HAVE NAUSEA, BLOATING, ABDOMINAL PAIN, OR VOMITING.    FINDING OUT THE RESULTS OF YOUR TEST Not all test results are available during your visit. DR. Oneida Alar WILL CALL YOU WITHIN 14 DAYS OF YOUR PROCEDUE WITH YOUR RESULTS. Do not assume everything is normal if you have not heard from DR. Aricela Bertagnolli, CALL HER OFFICE AT [phone].  SEEK IMMEDIATE MEDICAL ATTENTION AND CALL THE OFFICE: [phone] IF:  You have more than a spotting of blood in your stool.   Your belly is swollen (abdominal distention).   You are nauseated or vomiting.   You have a temperature over 101F.   You have abdominal pain or discomfort that is severe or gets worse throughout the day.  High-Fiber Diet A high-fiber diet changes your normal diet to include more whole grains, legumes, fruits, and vegetables. Changes in the diet involve replacing refined carbohydrates with unrefined foods. The calorie level of the diet is essentially unchanged. The Dietary Reference Intake (recommended amount) for adult males is 38 grams per day. For adult females, it is 25 grams per day. Pregnant and lactating women should consume 28 grams of fiber per day. Fiber is the intact part of a plant that is not broken down during digestion. Functional fiber is fiber that has been isolated from the plant to provide a beneficial effect in  the body. PURPOSE  Increase stool bulk.   Ease and regulate bowel movements.   Lower cholesterol.   REDUCE RISK OF COLON CANCER  INDICATIONS THAT YOU NEED MORE FIBER  Constipation and hemorrhoids.   Uncomplicated diverticulosis (intestine condition) and irritable bowel syndrome.   Weight management.   As a protective measure against hardening of the arteries (atherosclerosis), diabetes, and cancer.   GUIDELINES FOR INCREASING FIBER IN THE DIET  Start adding fiber to the diet slowly. A gradual increase of about 5  more grams (2 slices of whole-wheat bread, 2 servings of most fruits or vegetables, or 1 bowl of high-fiber cereal) per day is best. Too rapid an increase in fiber may result in constipation, flatulence, and bloating.   Drink enough water and fluids to keep your urine clear or pale yellow. Water, juice, or caffeine-free drinks are recommended. Not drinking enough fluid may cause constipation.   Eat a variety of high-fiber foods rather than one type of fiber.   Try to increase your intake of fiber through using high-fiber foods rather than fiber pills or supplements that contain small amounts of fiber.   The goal is to change the types of food eaten. Do not supplement your present diet with high-fiber foods, but replace foods in your present diet.   INCLUDE A VARIETY OF FIBER SOURCES  Replace refined and processed grains with whole grains, canned fruits with fresh fruits, and incorporate other fiber sources. White rice, white breads, and most bakery goods contain little or no fiber.   Brown whole-grain rice, buckwheat oats, and many fruits and vegetables are all good sources of fiber. These include: broccoli, Brussels sprouts, cabbage, cauliflower, beets, sweet potatoes, white potatoes (skin on), carrots, tomatoes, eggplant, squash, berries, fresh fruits, and dried fruits.   Cereals appear to be the richest source of fiber. Cereal fiber is found in whole grains and bran. Bran is the fiber-rich outer coat of cereal grain, which is largely removed in refining. In whole-grain cereals, the bran remains. In breakfast cereals, the largest amount of fiber is found in those with "bran" in their names. The fiber content is sometimes indicated on the label.   You may need to include additional fruits and vegetables each day.   In baking, for 1 cup white flour, you may use the following substitutions:   1 cup whole-wheat flour minus 2 tablespoons.   1/2 cup white flour plus 1/2 cup whole-wheat flour.    Polyps, Colon  A polyp is extra tissue that grows inside your body. Colon polyps grow in the large intestine. The large intestine, also called the colon, is part of your digestive system. It is a long, hollow tube at the end of your digestive tract where your body makes and stores stool. Most polyps are not dangerous. They are benign. This means they are not cancerous. But over time, some types of polyps can turn into cancer. Polyps that are smaller than a pea are usually not harmful. But larger polyps could someday become or may already be cancerous. To be safe, doctors remove all polyps and test them.   PREVENTION There is not one sure way to prevent polyps. You might be able to lower your risk of getting them if you:  Eat more fruits and vegetables and less fatty food.   Do not smoke.   Avoid alcohol.   Exercise every day.   Lose weight if you are overweight.   Eating more calcium and folate can also lower your  risk of getting polyps. Some foods that are rich in calcium are milk, cheese, and broccoli. Some foods that are rich in folate are chickpeas, kidney beans, and spinach.    Diverticulosis Diverticulosis is a common condition that develops when small pouches (diverticula) form in the wall of the colon. The risk of diverticulosis increases with age. It happens more often in people who eat a low-fiber diet. Most individuals with diverticulosis have no symptoms. Those individuals with symptoms usually experience belly (abdominal) pain, constipation, or loose stools (diarrhea).  HOME CARE INSTRUCTIONS  Increase the amount of fiber in your diet as directed by your caregiver or dietician. This may reduce symptoms of diverticulosis.   Drink at least 6 to 8 glasses of water each day to prevent constipation.   Try not to strain when you have a bowel movement.   Avoiding nuts and seeds to prevent complications is NOT NECESSARY.   FOODS HAVING HIGH FIBER CONTENT INCLUDE:  Fruits.  Apple, peach, pear, tangerine, raisins, prunes.   Vegetables. Brussels sprouts, asparagus, broccoli, cabbage, carrot, cauliflower, romaine lettuce, spinach, summer squash, tomato, winter squash, zucchini.   Starchy Vegetables. Baked beans, kidney beans, lima beans, split peas, lentils, potatoes (with skin).   Grains. Whole wheat bread, brown rice, bran flake cereal, plain oatmeal, white rice, shredded wheat, bran muffins.   SEEK IMMEDIATE MEDICAL CARE IF:  You develop increasing pain or severe bloating.   You have an oral temperature above 101F.   You develop vomiting or bowel movements that are bloody or black.   Hemorrhoids Hemorrhoids are dilated (enlarged) veins around the rectum. Sometimes clots will form in the veins. This makes them swollen and painful. These are called thrombosed hemorrhoids. Causes of hemorrhoids include:  Constipation.   Straining to have a bowel movement.   HEAVY LIFTING   HOME CARE INSTRUCTIONS  Eat a well balanced diet and drink 6 to 8 glasses of water every day to avoid constipation. You may also use a bulk laxative.   Avoid straining to have bowel movements.   Keep anal area dry and clean.   Do not use a donut shaped pillow or sit on the toilet for long periods. This increases blood pooling and pain.   Move your bowels when your body has the urge; this will require less straining and will decrease pain and pressure.

## 2018-02-07 NOTE — Op Note (Signed)
[facility] Patient Name: Melissa Barajas Procedure Date: 02/07/2018 8:06 AM MRN: [id number] Date of Birth: October 25, 1940 Attending MD: Barney Drain MD, MD CSN: [id number] Age: 77 Admit Type: Outpatient Procedure:                Colonoscopy WITH COLD FORCEPS/SNARE POLYPECTOMY Indications:              Hematochezia Providers:                Barney Drain MD, MD, Lurline Del, RN, Aram Candela Referring MD:             Elayne Snare. Luking Medicines:                Meperidine 75 mg IV, Midazolam 5 mg IV Complications:            No immediate complications. Estimated Blood Loss:     Estimated blood loss was minimal. Procedure:                Pre-Anesthesia Assessment:                           - Prior to the procedure, a History and Physical                            was performed, and patient medications and                            allergies were reviewed. The patient's tolerance of                            previous anesthesia was also reviewed. The risks                            and benefits of the procedure and the sedation                            options and risks were discussed with the patient.                            All questions were answered, and informed consent                            was obtained. Prior Anticoagulants: The patient has                            taken no previous anticoagulant or antiplatelet                            agents. ASA Grade Assessment: II - A patient with                            mild systemic disease. After reviewing the risks                            and benefits, the patient was deemed in  satisfactory condition to undergo the procedure.                            After obtaining informed consent, the colonoscope                            was passed under direct vision. Throughout the                            procedure, the patient's blood pressure, pulse, and                            oxygen saturations  were monitored continuously. The                            EC-[id number] ([id number]) scope was introduced through                            the anus and advanced to the 3 cm into the ileum.                            The colonoscopy was somewhat difficult due to a                            tortuous colon. Successful completion of the                            procedure was aided by straightening and shortening                            the scope to obtain bowel loop reduction and                            COLOWRAP. The patient tolerated the procedure well.                            The quality of the bowel preparation was excellent.                            The terminal ileum, ileocecal valve, appendiceal                            orifice, and rectum were photographed. Scope In: 8:32:47 AM Scope Out: 8:53:28 AM Scope Withdrawal Time: 0 hours 17 minutes 36 seconds  Total Procedure Duration: 0 hours 20 minutes 41 seconds  Findings:      The terminal ileum appeared normal.      Two sessile polyps were found in the sigmoid colon and descending colon.       The polyps were 2 to 3 mm in size. These polyps were removed with a cold       biopsy forceps. Resection and retrieval were complete.      A 6 mm polyp was found in the ascending colon. The polyp was sessile.       The  polyp was removed with a cold snare. Resection and retrieval were       complete.      A few small-mouthed diverticula were found in the recto-sigmoid colon       and sigmoid colon.      Internal hemorrhoids were found during retroflexion. The hemorrhoids       were small.      External hemorrhoids were found during retroflexion and during digital       exam. The hemorrhoids were large. Impression:               - The examined portion of the ileum was normal.                           - Two 2 to 3 mm polyps in the sigmoid colon and in                            the descending colon, removed with a cold biopsy                             forceps. Resected and retrieved.                           - One 6 mm polyp in the ascending colon, removed                            with a cold snare. Resected and retrieved.                           - Diverticulosis in the recto-sigmoid colon and in                            the sigmoid colon.                           - RECTAL BLEEDING DUE TO Internal hemorrhoids.                           - External hemorrhoids. Moderate Sedation:      Moderate (conscious) sedation was administered by the endoscopy nurse       and supervised by the endoscopist. The following parameters were       monitored: oxygen saturation, heart rate, blood pressure, and response       to care. Total physician intraservice time was 33 minutes. Recommendation:           - Repeat colonoscopy is not recommended due to                            current age (72 years or older) for surveillance.                           - High fiber diet.                           - Continue present medications.                           -  Await pathology results.                           - Return to my office in 3 months.                           - Patient has a contact number available for                            emergencies. The signs and symptoms of potential                            delayed complications were discussed with the                            patient. Return to normal activities tomorrow.                            Written discharge instructions were provided to the                            patient. Procedure Code(s):        --- Professional ---                           [phone], Colonoscopy, flexible; with removal of                            tumor(s), polyp(s), or other lesion(s) by snare                            technique                           45380, 59, Colonoscopy, flexible; with biopsy,                            single or multiple                           G0500, Moderate  sedation services provided by the                            same physician or other qualified health care                            professional performing a gastrointestinal                            endoscopic service that sedation supports,                            requiring the presence of an independent trained                            observer to assist in the monitoring of  the                            patient's level of consciousness and physiological                            status; initial 15 minutes of intra-service time;                            patient age 85 years or older (additional time may                            be reported with [phone], as appropriate)                           [phone], Moderate sedation services provided by the                            same physician or other qualified health care                            professional performing the diagnostic or                            therapeutic service that the sedation supports,                            requiring the presence of an independent trained                            observer to assist in the monitoring of the                            patient's level of consciousness and physiological                            status; each additional 15 minutes intraservice                            time (List separately in addition to code for                            primary service) Diagnosis Code(s):        --- Professional ---                           K64.4, Residual hemorrhoidal skin tags                           K64.8, Other hemorrhoids                           D12.5, Benign neoplasm of sigmoid colon                           D12.4, Benign  neoplasm of descending colon                           D12.2, Benign neoplasm of ascending colon                           K92.1, Melena (includes Hematochezia)                           K57.30, Diverticulosis of large intestine without                             perforation or abscess without bleeding CPT copyright 2017 [employer]. All rights reserved. The codes documented in this report are preliminary and upon coder review may  be revised to meet current compliance requirements. Barney Drain, MD Barney Drain MD, MD 02/07/2018 9:05:34 AM This report has been signed electronically. Number of Addenda: 0

## 2018-02-12 ENCOUNTER — Encounter (HOSPITAL_COMMUNITY): Payer: Self-pay | Admitting: Gastroenterology

## 2018-02-20 ENCOUNTER — Telehealth: Payer: Self-pay | Admitting: Gastroenterology

## 2018-02-20 NOTE — Telephone Encounter (Signed)
ONE BENIGN PROLAPSE POLYP AND TWO HYPERPLASTIC POLYPS REMOVED.    DRINK WATER TO KEEP YOUR URINE LIGHT YELLOW.  FOLLOW A HIGH FIBER DIET. AVOID ITEMS THAT CAUSE BLOATING.   USE PREPARATION H FOUR TIMES  A DAY IF NEEDED TO RELIEVE RECTAL PAIN/PRESSURE/BLEEDING.  FOLLOW UP IN 3 MOS E30 DYSPHAGIA/GERD.

## 2018-02-20 NOTE — Telephone Encounter (Signed)
Called, many rings and no answer. Mailing a letter to call for results.

## 2018-02-20 NOTE — Telephone Encounter (Signed)
PATIENT SCHEDULED  °

## 2018-02-25 ENCOUNTER — Telehealth: Payer: Self-pay | Admitting: Gastroenterology

## 2018-02-25 NOTE — Telephone Encounter (Signed)
PLEASE CALL PATIENT IF HER BIOPSY RESULTS ARE BACK FROM HER PROCEDURE

## 2018-02-26 NOTE — Telephone Encounter (Signed)
PT is aware of path results from procedure.

## 2018-03-04 ENCOUNTER — Inpatient Hospital Stay (HOSPITAL_COMMUNITY): Payer: Medicare Other | Attending: Hematology

## 2018-03-04 LAB — COMPREHENSIVE METABOLIC PANEL
ALT: 14 U/L (ref 14–54)
ANION GAP: 7 (ref 5–15)
AST: 21 U/L (ref 15–41)
Albumin: 3.7 g/dL (ref 3.5–5.0)
Alkaline Phosphatase: 66 U/L (ref 38–126)
BILIRUBIN TOTAL: 0.6 mg/dL (ref 0.3–1.2)
BUN: 15 mg/dL (ref 6–20)
CO2: 29 mmol/L (ref 22–32)
Calcium: 9.4 mg/dL (ref 8.9–10.3)
Chloride: 104 mmol/L (ref 101–111)
Creatinine, Ser: 0.7 mg/dL (ref 0.44–1.00)
Glucose, Bld: 114 mg/dL — ABNORMAL HIGH (ref 65–99)
POTASSIUM: 4 mmol/L (ref 3.5–5.1)
Sodium: 140 mmol/L (ref 135–145)
TOTAL PROTEIN: 7.1 g/dL (ref 6.5–8.1)

## 2018-03-07 ENCOUNTER — Encounter (HOSPITAL_COMMUNITY): Payer: Self-pay | Admitting: Hematology

## 2018-03-07 ENCOUNTER — Inpatient Hospital Stay (HOSPITAL_BASED_OUTPATIENT_CLINIC_OR_DEPARTMENT_OTHER): Payer: Medicare Other | Admitting: Hematology

## 2018-03-07 NOTE — Progress Notes (Signed)
Melissa Barajas, [place] [address]   CLINIC:  Medical Oncology/Hematology  PCP:  Kathyrn Drown, MD [address] [place] [address] [phone]   REASON FOR VISIT:  Follow-up for mild hypercalcemia.  CURRENT THERAPY: Discontinuation of indapamide.   INTERVAL HISTORY:  Melissa Barajas 77 y.o. female returns for routine follow-up of blood work for mild hypercalcemia.  We have discontinued indapamide at her last visit.  She did not report any worsening of leg swellings.  She had a colonoscopy done end of May.  She had on and off diarrhea since then.  Denies any episodes of hospitalizations since last visit.    REVIEW OF SYSTEMS:  Review of Systems  Cardiovascular: Positive for leg swelling.  Gastrointestinal: Positive for diarrhea.  Neurological: Positive for numbness.  Psychiatric/Behavioral: Positive for sleep disturbance.  All other systems reviewed and are negative.    PAST MEDICAL/SURGICAL HISTORY:  Past Medical History:  Diagnosis Date  . Arthritis   . Atrial fibrillation ([place])   . Collagen vascular disease ([place])   . DIVERTICULOSIS OF COLON 08/25/2010   Qualifier: Diagnosis of  By: Hoy Morn    . GERD (gastroesophageal reflux disease)   . HEMORRHOIDS, INTERNAL 08/25/2010   Qualifier: Diagnosis of  By: Hoy Morn    . Hypercholesteremia   . Hypertension   . Lipoma of arm    Left   Past Surgical History:  Procedure Laterality Date  . ABDOMINAL HYSTERECTOMY     Partial  . COLONOSCOPY  2009   SLF: 1. screening colonoscopy in 10 years 2. She should follow a high fiber diet She has been given a hand out on high fiber diverticulosois and hemorrhoids  . COLONOSCOPY N/A 02/07/2018   Procedure: COLONOSCOPY;  Surgeon: Danie Binder, MD;  [facility];  Service: Endoscopy;  Laterality: N/A;  1:00pm  . CYSTECTOMY     Left breast  . ESOPHAGOGASTRODUODENOSCOPY     NOB:[id number] appearing Schatzki's  ring, possible cervical esophageal web, both dilated with passage of a Maloney dilator/small hiatal hernia  . ESOPHAGOGASTRODUODENOSCOPY N/A 10/25/2015   Procedure: ESOPHAGOGASTRODUODENOSCOPY (EGD);  Surgeon: Danie Binder, MD;  [facility];  Service: Endoscopy;  Laterality: N/A;  1215pm  . POLYPECTOMY  02/07/2018   Procedure: POLYPECTOMY;  Surgeon: Danie Binder, MD;  [facility];  Service: Endoscopy;;  ascending colon, descending, sigmoid  . SAVORY DILATION N/A 10/25/2015   Procedure: SAVORY DILATION;  Surgeon: Danie Binder, MD;  [facility]: [facility];  Service: Endoscopy;  Laterality: N/A;  . THYROIDECTOMY    . Tumor   05/2010   Left breast under nipple  . VESICOVAGINAL FISTULA CLOSURE W/ TAH     fibroid tumors     SOCIAL HISTORY:  Social History   Socioeconomic History  . Marital status: Married    Spouse name: Not on file  . Number of children: Not on file  . Years of education: Not on file  . Highest education level: Not on file  Occupational History  . Not on file  Social Needs  . Financial resource strain: Not on file  . Food insecurity:    Worry: Not on file    Inability: Not on file  . Transportation needs:    Medical: Not on file    Non-medical: Not on file  Tobacco Use  . Smoking status: Former Smoker    Start date: 09/12/1983    Last attempt to  quit: 09/11/1993    Years since quitting: 24.5  . Smokeless tobacco: Never Used  Substance and Sexual Activity  . Alcohol use: No    Alcohol/week: 0.0 oz  . Drug use: No  . Sexual activity: Not on file  Lifestyle  . Physical activity:    Days per week: Not on file    Minutes per session: Not on file  . Stress: Not on file  Relationships  . Social connections:    Talks on phone: Not on file    Gets together: Not on file    Attends religious service: Not on file    Active member of club or organization: Not on file    Attends meetings of clubs or organizations: Not on file     Relationship status: Not on file  . Intimate partner violence:    Fear of current or ex partner: Not on file    Emotionally abused: Not on file    Physically abused: Not on file    Forced sexual activity: Not on file  Other Topics Concern  . Not on file  Social History Narrative  . Not on file    FAMILY HISTORY:  Family History  Problem Relation Age of Onset  . Cancer Brother        Prostate    CURRENT MEDICATIONS:  Outpatient Encounter Medications as of 03/07/2018  Medication Sig Note  . acetaminophen (TYLENOL) 500 MG tablet Take 1,000 mg by mouth 2 (two) times daily as needed for moderate pain or headache.   Marland Kitchen amoxicillin (AMOXIL) 500 MG capsule Take 2,000 mg by mouth See admin instructions. Take 1 hour prior to dental procedure   . CARTIA XT 300 MG 24 hr capsule TAKE 1 CAPSULE BY MOUTH EVERY DAY   . cholecalciferol (VITAMIN D) 1000 units tablet Take 1,000 Units by mouth daily.   Marland Kitchen etodolac (LODINE) 400 MG tablet TK 1 T PO BID   . indapamide (LOZOL) 2.5 MG tablet TAKE 1 TABLET(2.5 MG) BY MOUTH DAILY   . Na Sulfate-K Sulfate-Mg Sulf (SUPREP BOWEL PREP KIT) 17.5-3.13-1.6 GM/177ML SOLN Take 1 kit by mouth as directed. 01/29/2018: Hasnt started  . omeprazole (PRILOSEC) 20 MG capsule Take 20 mg by mouth daily.   Marland Kitchen OVER THE COUNTER MEDICATION Take 4 tablets by mouth daily. Focus factor    . Polyethyl Glycol-Propyl Glycol (SYSTANE OP) Place 2 drops into both eyes daily.   . potassium chloride (K-DUR) 10 MEQ tablet TAKE 1 TABLET(10 MEQ) BY MOUTH TWICE DAILY   . pravastatin (PRAVACHOL) 80 MG tablet TAKE 1 TABLET(80 MG) BY MOUTH DAILY   . Probiotic Product (PROBIOTIC DAILY PO) Take 1 tablet by mouth daily.    . temazepam (RESTORIL) 30 MG capsule TAKE ONE CAPSULE BY MOUTH AT BEDTIME    No facility-administered encounter medications on file as of 03/07/2018.     ALLERGIES:  Allergies  Allergen Reactions  . Aricept [Donepezil Hcl]     Sleep deprived  . Hydrocodone Nausea Only  .  Lisinopril     Angio edema  . Naprosyn [Naproxen]     nausea  . Vioxx [Rofecoxib]     Unknown reaction      PHYSICAL EXAM:  ECOG Performance status: 1  Vitals:   03/07/18 1108  BP: (!) 163/66  Pulse: 65  Resp: 16  Temp: 98.1 F (36.7 C)  SpO2: 100%   Filed Weights   03/07/18 1108  Weight: 184 lb 9.6 oz (83.7 kg)    Physical  Exam  Deferred. LABORATORY DATA:  I have reviewed the labs as listed.  CBC    Component Value Date/Time   WBC 7.1 12/22/2016 0848   WBC 10.6 (H) 12/17/2015 2021   RBC 4.28 12/22/2016 0848   RBC 4.07 12/17/2015 2021   HGB 13.6 12/22/2016 0848   HCT 40.6 12/22/2016 0848   PLT 232 12/22/2016 0848   MCV 95 12/22/2016 0848   MCH 31.8 12/22/2016 0848   MCH 32.2 12/17/2015 2021   MCHC 33.5 12/22/2016 0848   MCHC 34.5 12/17/2015 2021   RDW 14.8 12/22/2016 0848   LYMPHSABS 2.5 12/22/2016 0848   MONOABS 1.0 12/17/2015 2021   EOSABS 0.3 12/22/2016 0848   BASOSABS 0.1 12/22/2016 0848   CMP Latest Ref Rng & Units 03/04/2018 12/26/2017 11/16/2017  Glucose 65 - 99 mg/dL 114(H) - -  BUN 6 - 20 mg/dL 15 - -  Creatinine 0.44 - 1.00 mg/dL 0.70 - -  Sodium 135 - 145 mmol/L 140 - -  Potassium 3.5 - 5.1 mmol/L 4.0 - 3.8  Chloride 101 - 111 mmol/L 104 - -  CO2 22 - 32 mmol/L 29 - -  Calcium 8.9 - 10.3 mg/dL 9.4 10.5(H) 10.5(H)  Total Protein 6.5 - 8.1 g/dL 7.1 6.9 -  Total Bilirubin 0.3 - 1.2 mg/dL 0.6 - -  Alkaline Phos 38 - 126 U/L 66 - -  AST 15 - 41 U/L 21 - -  ALT 14 - 54 U/L 14 - -         ASSESSMENT & PLAN:   Hypercalcemia 1.  Mild hypercalcemia: - She has mild hypercalcemia ranging from 10.5-10.8 from August 2018.  She reports having some kind of surgery on parathyroids about 25 years ago.  I do not have any records of it. -Excellent work-up done by Dr. Wolfgang Phoenix was negative for primary hyperparathyroidism.  PTH related peptide and TSH were also normal.  25-hydroxy vitamin D was normal.  SPEP was negative.  24-hour urine showed some  elevation of free light chain ratio. - We have discontinued indapamide, on 02/01/2018 as thiazide diuretics can cause mild hypercalcemia.  She did not report any major leg swellings. - I have discussed the serum immunofixation results which were within normal limits.  Free light chain ratio was also within normal limits with kappa light chain of 25.5. - She recently had a colonoscopy on 02/07/2018 and 2 polyps in the sigmoid and descending colon were removed and were hyperplastic polyps.  There was also internal hemorrhoids and diverticulosis. - We will see her back with repeat labs in 6 months.  If they continue to be normal at that time, she will be discharged from our clinic.      Orders placed this encounter:  Orders Placed This Encounter  Procedures  . CBC with Differential  . Comprehensive metabolic panel  . Immunofixation electrophoresis  . Protein electrophoresis, serum  . Kappa/lambda light chains      Derek Jack, [place] [phone]

## 2018-03-07 NOTE — Assessment & Plan Note (Addendum)
1.  Mild hypercalcemia: - She has mild hypercalcemia ranging from 10.5-10.8 from August 2018.  She reports having some kind of surgery on parathyroids about 25 years ago.  I do not have any records of it. -Excellent work-up done by Dr. Wolfgang Phoenix was negative for primary hyperparathyroidism.  PTH related peptide and TSH were also normal.  25-hydroxy vitamin D was normal.  SPEP was negative.  24-hour urine showed some elevation of free light chain ratio. - We have discontinued indapamide, on 02/01/2018 as thiazide diuretics can cause mild hypercalcemia.  She did not report any major leg swellings.  Her calcium has normalized today. - I have discussed the serum immunofixation results which were within normal limits.  Free light chain ratio was also within normal limits with kappa light chain of 25.5. - She recently had a colonoscopy on 02/07/2018 and 2 polyps in the sigmoid and descending colon were removed and were hyperplastic polyps.  There was also internal hemorrhoids and diverticulosis. - We will see her back with repeat labs in 6 months.  If they continue to be normal at that time, she will be discharged from our clinic.

## 2018-03-07 NOTE — Patient Instructions (Signed)
[facility] Discharge Instructions  You saw Dr. Katragadda today.   Thank you for choosing [facility] at [facility] to provide your oncology and hematology care.  To afford each patient quality time with our provider, please arrive at least 15 minutes before your scheduled appointment time.   If you have a lab appointment with the Cancer Center please come in thru the  Main Entrance and check in at the main information desk  You need to re-schedule your appointment should you arrive 10 or more minutes late.  We strive to give you quality time with our providers, and arriving late affects you and other patients whose appointments are after yours.  Also, if you no show three or more times for appointments you may be dismissed from the clinic at the providers discretion.     Again, thank you for choosing [facility].  Our hope is that these requests will decrease the amount of time that you wait before being seen by our physicians.       _____________________________________________________________  Should you have questions after your visit to [facility], please contact our office at [phone] between the hours of 8:30 a.m. and 4:30 p.m.  Voicemails left after 4:30 p.m. will not be returned until the following business day.  For prescription refill requests, have your pharmacy contact our office.       Resources For Cancer Patients and their Caregivers ? American Cancer Society: Can assist with transportation, wigs, general needs, runs Look Good Feel Better.        1-888-227-6333 ? Cancer Care: Provides financial assistance, online support groups, medication/co-pay assistance.  1-800-813-HOPE (4673) ? [facility] Assists [place] cancer patients and their families through emotional , educational and financial support.  336-427-4357 ? [place] DSS Where to apply for  food stamps, Medicaid and utility assistance. 336-342-1394 ? [facility]: Transportation to medical appointments. 336-347-2287 ? Social Security Administration: May apply for disability if have a Stage IV cancer. 336-342-7796 1-800-772-1213 ? [place] Aging, Disability and Transit Services: Assists with nutrition, care and transit needs. 336-349-2343  Cancer Center Support Programs:   > Cancer Support Group  2nd Tuesday of the month 1pm-2pm, Journey Room   > Creative Journey  3rd Tuesday of the month 1130am-1pm, Journey Room     

## 2018-03-19 ENCOUNTER — Telehealth: Payer: Self-pay | Admitting: Family Medicine

## 2018-03-19 NOTE — Telephone Encounter (Signed)
New prescription request for Diltiazem 300 MG, Pravastatin 80MG , Potassium 10 MeQ, Temazepam 30 MG, and Etodolac 400 MG.

## 2018-03-19 NOTE — Telephone Encounter (Signed)
Ok three mo all

## 2018-03-20 ENCOUNTER — Other Ambulatory Visit: Payer: Self-pay | Admitting: *Deleted

## 2018-03-20 MED ORDER — DILTIAZEM HCL ER COATED BEADS 300 MG PO CP24: ORAL_CAPSULE | ORAL | 0 refills | Status: DC

## 2018-03-20 MED ORDER — PRAVASTATIN SODIUM 80 MG PO TABS: ORAL_TABLET | ORAL | 0 refills | Status: DC

## 2018-03-20 MED ORDER — ETODOLAC 400 MG PO TABS: ORAL_TABLET | ORAL | 0 refills | Status: DC

## 2018-03-20 MED ORDER — POTASSIUM CHLORIDE ER 10 MEQ PO TBCR: EXTENDED_RELEASE_TABLET | ORAL | 0 refills | Status: DC

## 2018-03-20 MED ORDER — TEMAZEPAM 30 MG PO CAPS: 30.0000 mg | ORAL_CAPSULE | Freq: Every day | ORAL | 0 refills | Status: DC

## 2018-03-20 NOTE — Telephone Encounter (Signed)
Refills sent to pharm

## 2018-04-12 ENCOUNTER — Other Ambulatory Visit: Payer: Self-pay

## 2018-05-01 ENCOUNTER — Other Ambulatory Visit: Payer: Self-pay | Admitting: Family Medicine

## 2018-05-02 NOTE — Telephone Encounter (Signed)
May have 1 refill follow-up this fall

## 2018-05-06 ENCOUNTER — Ambulatory Visit (INDEPENDENT_AMBULATORY_CARE_PROVIDER_SITE_OTHER): Payer: Medicare Other | Admitting: Family Medicine

## 2018-05-06 ENCOUNTER — Encounter: Payer: Self-pay | Admitting: Family Medicine

## 2018-05-06 VITALS — BP 130/76 | Ht 66.0 in | Wt 177.0 lb

## 2018-05-06 DIAGNOSIS — I10 Essential (primary) hypertension: Secondary | ICD-10-CM

## 2018-05-06 DIAGNOSIS — E7849 Other hyperlipidemia: Secondary | ICD-10-CM | POA: Diagnosis not present

## 2018-05-06 MED ORDER — DILTIAZEM HCL ER COATED BEADS 300 MG PO CP24: ORAL_CAPSULE | ORAL | 1 refills | Status: DC

## 2018-05-06 MED ORDER — ETODOLAC 400 MG PO TABS: ORAL_TABLET | ORAL | 1 refills | Status: DC

## 2018-05-06 MED ORDER — PRAVASTATIN SODIUM 80 MG PO TABS: ORAL_TABLET | ORAL | 1 refills | Status: DC

## 2018-05-06 MED ORDER — ZOSTER VAC RECOMB ADJUVANTED 50 MCG/0.5ML IM SUSR: 0.5000 mL | Freq: Once | INTRAMUSCULAR | 1 refills | Status: AC

## 2018-05-06 NOTE — Progress Notes (Signed)
   Subjective:    Patient ID: Melissa Barajas, female    DOB: 09-26-1940, 77 y.o.   MRN: [id number]  Hypertension  This is a chronic problem. The current episode started more than 1 year ago. Pertinent negatives include no chest pain, headaches or shortness of breath. Risk factors for coronary artery disease include dyslipidemia and post-menopausal state. Treatments tried: lozol, diltiazem. There are no compliance problems.    Patient recently celebrated his 77th birthday Patient takes her medicine on a regular basis Hematologist eliminated her diuretic because he felt that it was causing her hypercalcemia She would do lab work in the fall if that looks good they will release her She does try to minimize salt in her diet   Review of Systems  Constitutional: Negative for activity change, fatigue and fever.  HENT: Negative for congestion and rhinorrhea.   Respiratory: Negative for cough, chest tightness and shortness of breath.   Cardiovascular: Negative for chest pain and leg swelling.  Gastrointestinal: Negative for abdominal pain and nausea.  Skin: Negative for color change.  Neurological: Negative for dizziness and headaches.  Psychiatric/Behavioral: Negative for agitation and behavioral problems.       Objective:   Physical Exam  Constitutional: She appears well-developed and well-nourished. No distress.  HENT:  Head: Normocephalic and atraumatic.  Eyes: Right eye exhibits no discharge. Left eye exhibits no discharge.  Neck: No tracheal deviation present.  Cardiovascular: Normal rate, regular rhythm and normal heart sounds.  No murmur heard. Pulmonary/Chest: Effort normal and breath sounds normal. No respiratory distress. She has no wheezes. She has no rales.  Musculoskeletal: She exhibits no edema.  Lymphadenopathy:    She has no cervical adenopathy.  Neurological: She is alert. She exhibits normal muscle tone.  Skin: Skin is warm and dry. No erythema.  Psychiatric: Her  behavior is normal.  Vitals reviewed.    Hyperlipidemia check lipid profile     Assessment & Plan:  HTN Borderline control Patient needs to be more healthy in her diet continue to try to lose weight In addition to this patient should do some walking on a regular basis She will follow-up in a few months if her blood pressure is still elevated consider adding additional medicine or possibly making the switch

## 2018-05-06 NOTE — Patient Instructions (Addendum)
Shingrix and shingles prevention: know the facts!   Shingrix is a very effective vaccine to prevent shingles.   Shingles is a reactivation of chickenpox -more than 99% of Americans born before 1980 have had chickenpox even if they do not remember it. One in every 10 people who get shingles have severe long-lasting nerve pain as a result.   33 out of a 100 older adults will get shingles if they are unvaccinated.     This vaccine is very important for your health This vaccine is indicated for anyone 50 years or older. You can get this vaccine even if you have already had shingles because you can get the disease more than once in a lifetime.  Your risk for shingles and its complications increases with age.  This vaccine has 2 doses.  The second dose would be 2 to 6 months after the first dose.  If you had Zostavax vaccine in the past you should still get Shingrix. ( Zostavax is only 70% effective and it loses significant strength over a few years .)  This vaccine is given through the pharmacy.  The cost of the vaccine is through your insurance. The pharmacy can inform you of the total costs.  Common side effects including soreness in the arm, some redness and swelling, also some feel fatigue muscle soreness headache low-grade fever.  Side effects typically go away within 2 to 3 days. Remember-the pain from shingles can last a lifetime but these side effects of the vaccine will only last a few days at most. It is very important to get both doses in order to protect yourself fully.   Please get this vaccine at your earliest convenience at your trusted pharmacy.       DASH Eating Plan DASH stands for "Dietary Approaches to Stop Hypertension." The DASH eating plan is a healthy eating plan that has been shown to reduce high blood pressure (hypertension). It may also reduce your risk for type 2 diabetes, heart disease, and stroke. The DASH eating plan may also help with weight loss. What  are tips for following this plan? General guidelines  Avoid eating more than 2,300 mg (milligrams) of salt (sodium) a day. If you have hypertension, you may need to reduce your sodium intake to 1,500 mg a day.  Limit alcohol intake to no more than 1 drink a day for nonpregnant women and 2 drinks a day for men. One drink equals 12 oz of beer, 5 oz of wine, or 1 oz of hard liquor.  Work with your health care provider to maintain a healthy body weight or to lose weight. Ask what an ideal weight is for you.  Get at least 30 minutes of exercise that causes your heart to beat faster (aerobic exercise) most days of the week. Activities may include walking, swimming, or biking.  Work with your health care provider or diet and nutrition specialist (dietitian) to adjust your eating plan to your individual calorie needs. Reading food labels  Check food labels for the amount of sodium per serving. Choose foods with less than 5 percent of the Daily Value of sodium. Generally, foods with less than 300 mg of sodium per serving fit into this eating plan.  To find whole grains, look for the word "whole" as the first word in the ingredient list. Shopping  Buy products labeled as "low-sodium" or "no salt added."  Buy fresh foods. Avoid canned foods and premade or frozen meals. Cooking  Avoid adding salt when  cooking. Use salt-free seasonings or herbs instead of table salt or sea salt. Check with your health care provider or pharmacist before using salt substitutes.  Do not fry foods. Cook foods using healthy methods such as baking, boiling, grilling, and broiling instead.  Cook with heart-healthy oils, such as olive, canola, soybean, or sunflower oil. Meal planning   Eat a balanced diet that includes: ? 5 or more servings of fruits and vegetables each day. At each meal, try to fill half of your plate with fruits and vegetables. ? Up to 6-8 servings of whole grains each day. ? Less than 6 oz of lean  meat, poultry, or fish each day. A 3-oz serving of meat is about the same size as a deck of cards. One egg equals 1 oz. ? 2 servings of low-fat dairy each day. ? A serving of nuts, seeds, or beans 5 times each week. ? Heart-healthy fats. Healthy fats called Omega-3 fatty acids are found in foods such as flaxseeds and coldwater fish, like sardines, salmon, and mackerel.  Limit how much you eat of the following: ? Canned or prepackaged foods. ? Food that is high in trans fat, such as fried foods. ? Food that is high in saturated fat, such as fatty meat. ? Sweets, desserts, sugary drinks, and other foods with added sugar. ? Full-fat dairy products.  Do not salt foods before eating.  Try to eat at least 2 vegetarian meals each week.  Eat more home-cooked food and less restaurant, buffet, and fast food.  When eating at a restaurant, ask that your food be prepared with less salt or no salt, if possible. What foods are recommended? The items listed may not be a complete list. Talk with your dietitian about what dietary choices are best for you. Grains Whole-grain or whole-wheat bread. Whole-grain or whole-wheat pasta. Brown rice. [place]. Bulgur. Whole-grain and low-sodium cereals. Pita bread. Low-fat, low-sodium crackers. Whole-wheat flour tortillas. Vegetables Fresh or frozen vegetables (raw, steamed, roasted, or grilled). Low-sodium or reduced-sodium tomato and vegetable juice. Low-sodium or reduced-sodium tomato sauce and tomato paste. Low-sodium or reduced-sodium canned vegetables. Fruits All fresh, dried, or frozen fruit. Canned fruit in natural juice (without added sugar). Meat and other protein foods Skinless chicken or [place]. Ground chicken or [place]. Pork with fat trimmed off. Fish and seafood. Egg whites. Dried beans, peas, or lentils. Unsalted nuts, nut butters, and seeds. Unsalted canned beans. Lean cuts of beef with fat trimmed off. Low-sodium, lean deli  meat. Dairy Low-fat (1%) or fat-free (skim) milk. Fat-free, low-fat, or reduced-fat cheeses. Nonfat, low-sodium ricotta or cottage cheese. Low-fat or nonfat yogurt. Low-fat, low-sodium cheese. Fats and oils Soft margarine without trans fats. Vegetable oil. Low-fat, reduced-fat, or light mayonnaise and salad dressings (reduced-sodium). Canola, safflower, olive, soybean, and sunflower oils. Avocado. Seasoning and other foods Herbs. Spices. Seasoning mixes without salt. Unsalted popcorn and pretzels. Fat-free sweets. What foods are not recommended? The items listed may not be a complete list. Talk with your dietitian about what dietary choices are best for you. Grains Baked goods made with fat, such as croissants, muffins, or some breads. Dry pasta or rice meal packs. Vegetables Creamed or fried vegetables. Vegetables in a cheese sauce. Regular canned vegetables (not low-sodium or reduced-sodium). Regular canned tomato sauce and paste (not low-sodium or reduced-sodium). Regular tomato and vegetable juice (not low-sodium or reduced-sodium). Angie Fava. Olives. Fruits Canned fruit in a light or heavy syrup. Fried fruit. Fruit in cream or butter sauce. Meat and other protein  foods Fatty cuts of meat. Ribs. Fried meat. Berniece Salines. Sausage. Bologna and other processed lunch meats. Salami. Fatback. Hotdogs. Bratwurst. Salted nuts and seeds. Canned beans with added salt. Canned or smoked fish. Whole eggs or egg yolks. Chicken or [place] with skin. Dairy Whole or 2% milk, cream, and half-and-half. Whole or full-fat cream cheese. Whole-fat or sweetened yogurt. Full-fat cheese. Nondairy creamers. Whipped toppings. Processed cheese and cheese spreads. Fats and oils Butter. Stick margarine. Lard. Shortening. Ghee. Bacon fat. Tropical oils, such as coconut, palm kernel, or palm oil. Seasoning and other foods Salted popcorn and pretzels. Onion salt, garlic salt, seasoned salt, table salt, and sea salt. Worcestershire  sauce. Tartar sauce. Barbecue sauce. Teriyaki sauce. Soy sauce, including reduced-sodium. Steak sauce. Canned and packaged gravies. Fish sauce. Oyster sauce. Cocktail sauce. Horseradish that you find on the shelf. Ketchup. Mustard. Meat flavorings and tenderizers. Bouillon cubes. Hot sauce and Tabasco sauce. Premade or packaged marinades. Premade or packaged taco seasonings. Relishes. Regular salad dressings. Where to find more information:  National Heart, Lung, and [place]: https://wilson-eaton.com/  American Heart Association: www.heart.org Summary  The DASH eating plan is a healthy eating plan that has been shown to reduce high blood pressure (hypertension). It may also reduce your risk for type 2 diabetes, heart disease, and stroke.  With the DASH eating plan, you should limit salt (sodium) intake to 2,300 mg a day. If you have hypertension, you may need to reduce your sodium intake to 1,500 mg a day.  When on the DASH eating plan, aim to eat more fresh fruits and vegetables, whole grains, lean proteins, low-fat dairy, and heart-healthy fats.  Work with your health care provider or diet and nutrition specialist (dietitian) to adjust your eating plan to your individual calorie needs. This information is not intended to replace advice given to you by your health care provider. Make sure you discuss any questions you have with your health care provider. Document Released: 08/17/2011 Document Revised: 08/21/2016 Document Reviewed: 08/21/2016 Elsevier Interactive Patient Education  [employer].

## 2018-05-10 ENCOUNTER — Ambulatory Visit (INDEPENDENT_AMBULATORY_CARE_PROVIDER_SITE_OTHER): Payer: Medicare Other | Admitting: Gastroenterology

## 2018-05-10 ENCOUNTER — Encounter: Payer: Self-pay | Admitting: Gastroenterology

## 2018-05-10 VITALS — BP 139/65 | HR 63 | Temp 97.2°F | Ht 66.0 in | Wt 178.0 lb

## 2018-05-10 DIAGNOSIS — K625 Hemorrhage of anus and rectum: Secondary | ICD-10-CM | POA: Diagnosis not present

## 2018-05-10 DIAGNOSIS — K219 Gastro-esophageal reflux disease without esophagitis: Secondary | ICD-10-CM

## 2018-05-10 NOTE — Progress Notes (Signed)
Primary Care Physician: Kathyrn Drown, MD  Primary Gastroenterologist:  Barney Drain, MD   Chief Complaint  Patient presents with  . Rectal Bleeding    TCS f/u, doing ok    HPI: Melissa Barajas is a 77 y.o. female here for follow-up.  She had a colonoscopy back in May for hematochezia.  Total of 3 polyps removed ranging from 2 to 6 mm in size, diverticulosis, rectal bleeding felt to be related to internal hemorrhoids.  Pathology revealed prolapse type polyp in the sigmoid colon, hyperplastic polyp in the ascending and descending colon.  No future surveillance colonoscopy is recommended given her age.  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  H. pylori.  States she is made significant dietary changes, cut out most of her red meat, decrease starches and sweets.  She has had unremarkable change in her reflux symptoms.  No longer having dysphagia.  No heartburn.  Was able to come off of the omeprazole.  She has it available just in case she needs it.  Denies any abdominal pain.  Bowel movements are regular.  No melena or rectal bleeding.  Things seem to improve with change in her diet.  Current Outpatient Medications  Medication Sig Dispense Refill  . acetaminophen (TYLENOL) 500 MG tablet Take 1,000 mg by mouth 2 (two) times daily as needed for moderate pain or headache.    . cholecalciferol (VITAMIN D) 1000 units tablet Take 1,000 Units by mouth daily.    Marland Kitchen diltiazem (CARTIA XT) 300 MG 24 hr capsule TAKE 1 CAPSULE BY MOUTH EVERY DAY 90 capsule 1  . etodolac (LODINE) 400 MG tablet TK 1 T PO BID 180 tablet 1  . OVER THE COUNTER MEDICATION Take 4 tablets by mouth daily. Focus factor     . Polyethyl Glycol-Propyl Glycol (SYSTANE OP) Place 2 drops into both eyes daily.    . pravastatin (PRAVACHOL) 80 MG tablet TAKE 1 TABLET(80 MG) BY MOUTH DAILY 90 tablet 1  . Probiotic Product (PROBIOTIC DAILY PO) Take 1  tablet by mouth daily.      No current facility-administered medications for this visit.     Allergies as of 05/10/2018 - Review Complete 05/10/2018  Allergen Reaction Noted  . Aricept [donepezil hcl]  11/20/2017  . Hydrocodone Nausea Only 01/08/2013  . Lisinopril  01/08/2013  . Naprosyn [naproxen]  04/06/2016  . Vioxx [rofecoxib]  01/08/2013    ROS:  General: Negative for anorexia, weight loss, fever, chills, fatigue, weakness. ENT: Negative for hoarseness, difficulty swallowing , nasal congestion. CV: Negative for chest pain, angina, palpitations, dyspnea on exertion, peripheral edema.  Respiratory: Negative for dyspnea at rest, dyspnea on exertion, cough, sputum, wheezing.  GI: See history of present illness. GU:  Negative for dysuria, hematuria, urinary incontinence, urinary frequency, nocturnal urination.  Endo: Negative for unusual weight change.    Physical Examination:   BP 139/65   Pulse 63   Temp (!) 97.2 F (36.2 C) (Oral)   Ht 5\' 6"  (1.676 m)   Wt 178 lb (80.7 kg)   BMI 28.73 kg/m   General: Well-nourished, well-developed in no acute distress.  Eyes: No icterus. Mouth: Oropharyngeal mucosa moist and pink , no lesions erythema or exudate. Lungs: Clear to auscultation bilaterally.  Heart: Regular rate and rhythm, no murmurs rubs or gallops.  Abdomen: Bowel sounds are normal, nontender, nondistended, no hepatosplenomegaly or  masses, no abdominal bruits or hernia , no rebound or guarding.   Extremities: No lower extremity edema. No clubbing or deformities. Neuro: Alert and oriented x 4   Skin: Warm and dry, no jaundice.   Psych: Alert and cooperative, normal mood and affect.  Labs:  Lab Results  Component Value Date   WBC 7.1 12/22/2016   HGB 13.6 12/22/2016   HCT 40.6 12/22/2016   MCV 95 12/22/2016   PLT 232 12/22/2016   Lab Results  Component Value Date   CREATININE 0.70 03/04/2018   BUN 15 03/04/2018   NA 140 03/04/2018   K 4.0 03/04/2018   CL  104 03/04/2018   CO2 29 03/04/2018   Lab Results  Component Value Date   ALT 14 03/04/2018   AST 21 03/04/2018   ALKPHOS 66 03/04/2018   BILITOT 0.6 03/04/2018    Imaging Studies: No results found.

## 2018-05-10 NOTE — Assessment & Plan Note (Signed)
Symptoms well controlled.  She was able to come off of the omeprazole.  Only uses if needed for the most part has done very well for the past 2 to 3 months due to significant dietary changes.  Return to the office in 6 months or call sooner if needed.

## 2018-05-10 NOTE — Patient Instructions (Addendum)
Keep up the good work with your dietary changes!   Use omeprazole once daily as needed for acid reflux.  No need for future colonoscopy for SCREENING purposes. You would only need future colonoscopy if you develop new problems such as change in bowels, change in rectal bleeding, anemia, etc  See you back in six months.

## 2018-05-10 NOTE — Progress Notes (Signed)
cc'ed to pcp °

## 2018-05-10 NOTE — Assessment & Plan Note (Signed)
Likely due to hemorrhoids.  She has been doing well without any recurrent hematochezia since her colonoscopy.  She maintains adequate bowel function with dietary measures.  Return to the office in 6 months or call sooner if needed

## 2018-05-27 ENCOUNTER — Other Ambulatory Visit: Payer: Self-pay | Admitting: Family Medicine

## 2018-06-13 DIAGNOSIS — E7849 Other hyperlipidemia: Secondary | ICD-10-CM | POA: Diagnosis not present

## 2018-06-14 LAB — LIPID PANEL
CHOL/HDL RATIO: 3.3 ratio (ref 0.0–4.4)
Cholesterol, Total: 159 mg/dL (ref 100–199)
HDL: 48 mg/dL (ref >39)
LDL CALC: 99 mg/dL (ref 0–99)
Triglycerides: 62 mg/dL (ref 0–149)
VLDL CHOLESTEROL CAL: 12 mg/dL (ref 5–40)

## 2018-06-21 ENCOUNTER — Encounter: Payer: Self-pay | Admitting: Family Medicine

## 2018-06-21 ENCOUNTER — Ambulatory Visit (INDEPENDENT_AMBULATORY_CARE_PROVIDER_SITE_OTHER): Payer: Medicare Other | Admitting: Family Medicine

## 2018-06-21 VITALS — BP 144/70 | Temp 98.4°F | Ht 66.0 in | Wt 182.0 lb

## 2018-06-21 DIAGNOSIS — Z23 Encounter for immunization: Secondary | ICD-10-CM

## 2018-06-21 DIAGNOSIS — R059 Cough, unspecified: Secondary | ICD-10-CM

## 2018-06-21 DIAGNOSIS — I10 Essential (primary) hypertension: Secondary | ICD-10-CM | POA: Diagnosis not present

## 2018-06-21 DIAGNOSIS — R05 Cough: Secondary | ICD-10-CM | POA: Diagnosis not present

## 2018-06-21 MED ORDER — ALBUTEROL SULFATE HFA 108 (90 BASE) MCG/ACT IN AERS: 2.0000 | INHALATION_SPRAY | RESPIRATORY_TRACT | 1 refills | Status: DC | PRN

## 2018-06-21 NOTE — Patient Instructions (Signed)
DASH Eating Plan DASH stands for "Dietary Approaches to Stop Hypertension." The DASH eating plan is a healthy eating plan that has been shown to reduce high blood pressure (hypertension). It may also reduce your risk for type 2 diabetes, heart disease, and stroke. The DASH eating plan may also help with weight loss. What are tips for following this plan? General guidelines  Avoid eating more than 2,300 mg (milligrams) of salt (sodium) a day. If you have hypertension, you may need to reduce your sodium intake to 1,500 mg a day.  Limit alcohol intake to no more than 1 drink a day for nonpregnant women and 2 drinks a day for men. One drink equals 12 oz of beer, 5 oz of wine, or 1 oz of hard liquor.  Work with your health care provider to maintain a healthy body weight or to lose weight. Ask what an ideal weight is for you.  Get at least 30 minutes of exercise that causes your heart to beat faster (aerobic exercise) most days of the week. Activities may include walking, swimming, or biking.  Work with your health care provider or diet and nutrition specialist (dietitian) to adjust your eating plan to your individual calorie needs. Reading food labels  Check food labels for the amount of sodium per serving. Choose foods with less than 5 percent of the Daily Value of sodium. Generally, foods with less than 300 mg of sodium per serving fit into this eating plan.  To find whole grains, look for the word "whole" as the first word in the ingredient list. Shopping  Buy products labeled as "low-sodium" or "no salt added."  Buy fresh foods. Avoid canned foods and premade or frozen meals. Cooking  Avoid adding salt when cooking. Use salt-free seasonings or herbs instead of table salt or sea salt. Check with your health care provider or pharmacist before using salt substitutes.  Do not fry foods. Cook foods using healthy methods such as baking, boiling, grilling, and broiling instead.  Cook with  heart-healthy oils, such as olive, canola, soybean, or sunflower oil. Meal planning   Eat a balanced diet that includes: ? 5 or more servings of fruits and vegetables each day. At each meal, try to fill half of your plate with fruits and vegetables. ? Up to 6-8 servings of whole grains each day. ? Less than 6 oz of lean meat, poultry, or fish each day. A 3-oz serving of meat is about the same size as a deck of cards. One egg equals 1 oz. ? 2 servings of low-fat dairy each day. ? A serving of nuts, seeds, or beans 5 times each week. ? Heart-healthy fats. Healthy fats called Omega-3 fatty acids are found in foods such as flaxseeds and coldwater fish, like sardines, salmon, and mackerel.  Limit how much you eat of the following: ? Canned or prepackaged foods. ? Food that is high in trans fat, such as fried foods. ? Food that is high in saturated fat, such as fatty meat. ? Sweets, desserts, sugary drinks, and other foods with added sugar. ? Full-fat dairy products.  Do not salt foods before eating.  Try to eat at least 2 vegetarian meals each week.  Eat more home-cooked food and less restaurant, buffet, and fast food.  When eating at a restaurant, ask that your food be prepared with less salt or no salt, if possible. What foods are recommended? The items listed may not be a complete list. Talk with your dietitian about what   dietary choices are best for you. Grains Whole-grain or whole-wheat bread. Whole-grain or whole-wheat pasta. Brown rice. Oatmeal. Quinoa. Bulgur. Whole-grain and low-sodium cereals. Pita bread. Low-fat, low-sodium crackers. Whole-wheat flour tortillas. Vegetables Fresh or frozen vegetables (raw, steamed, roasted, or grilled). Low-sodium or reduced-sodium tomato and vegetable juice. Low-sodium or reduced-sodium tomato sauce and tomato paste. Low-sodium or reduced-sodium canned vegetables. Fruits All fresh, dried, or frozen fruit. Canned fruit in natural juice (without  added sugar). Meat and other protein foods Skinless chicken or turkey. Ground chicken or turkey. Pork with fat trimmed off. Fish and seafood. Egg whites. Dried beans, peas, or lentils. Unsalted nuts, nut butters, and seeds. Unsalted canned beans. Lean cuts of beef with fat trimmed off. Low-sodium, lean deli meat. Dairy Low-fat (1%) or fat-free (skim) milk. Fat-free, low-fat, or reduced-fat cheeses. Nonfat, low-sodium ricotta or cottage cheese. Low-fat or nonfat yogurt. Low-fat, low-sodium cheese. Fats and oils Soft margarine without trans fats. Vegetable oil. Low-fat, reduced-fat, or light mayonnaise and salad dressings (reduced-sodium). Canola, safflower, olive, soybean, and sunflower oils. Avocado. Seasoning and other foods Herbs. Spices. Seasoning mixes without salt. Unsalted popcorn and pretzels. Fat-free sweets. What foods are not recommended? The items listed may not be a complete list. Talk with your dietitian about what dietary choices are best for you. Grains Baked goods made with fat, such as croissants, muffins, or some breads. Dry pasta or rice meal packs. Vegetables Creamed or fried vegetables. Vegetables in a cheese sauce. Regular canned vegetables (not low-sodium or reduced-sodium). Regular canned tomato sauce and paste (not low-sodium or reduced-sodium). Regular tomato and vegetable juice (not low-sodium or reduced-sodium). Pickles. Olives. Fruits Canned fruit in a light or heavy syrup. Fried fruit. Fruit in cream or butter sauce. Meat and other protein foods Fatty cuts of meat. Ribs. Fried meat. Bacon. Sausage. Bologna and other processed lunch meats. Salami. Fatback. Hotdogs. Bratwurst. Salted nuts and seeds. Canned beans with added salt. Canned or smoked fish. Whole eggs or egg yolks. Chicken or turkey with skin. Dairy Whole or 2% milk, cream, and half-and-half. Whole or full-fat cream cheese. Whole-fat or sweetened yogurt. Full-fat cheese. Nondairy creamers. Whipped toppings.  Processed cheese and cheese spreads. Fats and oils Butter. Stick margarine. Lard. Shortening. Ghee. Bacon fat. Tropical oils, such as coconut, palm kernel, or palm oil. Seasoning and other foods Salted popcorn and pretzels. Onion salt, garlic salt, seasoned salt, table salt, and sea salt. Worcestershire sauce. Tartar sauce. Barbecue sauce. Teriyaki sauce. Soy sauce, including reduced-sodium. Steak sauce. Canned and packaged gravies. Fish sauce. Oyster sauce. Cocktail sauce. Horseradish that you find on the shelf. Ketchup. Mustard. Meat flavorings and tenderizers. Bouillon cubes. Hot sauce and Tabasco sauce. Premade or packaged marinades. Premade or packaged taco seasonings. Relishes. Regular salad dressings. Where to find more information:  National Heart, Lung, and Blood Institute: www.nhlbi.nih.gov  American Heart Association: www.heart.org Summary  The DASH eating plan is a healthy eating plan that has been shown to reduce high blood pressure (hypertension). It may also reduce your risk for type 2 diabetes, heart disease, and stroke.  With the DASH eating plan, you should limit salt (sodium) intake to 2,300 mg a day. If you have hypertension, you may need to reduce your sodium intake to 1,500 mg a day.  When on the DASH eating plan, aim to eat more fresh fruits and vegetables, whole grains, lean proteins, low-fat dairy, and heart-healthy fats.  Work with your health care provider or diet and nutrition specialist (dietitian) to adjust your eating plan to your individual   calorie needs. This information is not intended to replace advice given to you by your health care provider. Make sure you discuss any questions you have with your health care provider. Document Released: 08/17/2011 Document Revised: 08/21/2016 Document Reviewed: 08/21/2016 Elsevier Interactive Patient Education  2018 [employer]  

## 2018-06-21 NOTE — Progress Notes (Signed)
   Subjective:    Patient ID: Melissa Barajas, female    DOB: Sep 07, 1941, 77 y.o.   MRN: [id number]  Hypertension  This is a chronic problem. Pertinent negatives include no chest pain, headaches or shortness of breath. There are no compliance problems (takes meds every day, [place] [facility] and takes group classes and rides the bike,  health conscious with diet. ).     Wheezing. States it has been over one year.  Patient states that often when she is sitting or laying down she notices herself breathing and she hears a slight wheeze she denies high fever chills sweats denies any other troubles not coughing up anything denies getting short of breath with activity exercises twice a week Would like flu vaccine today.     Review of Systems  Constitutional: Negative for activity change, fatigue and fever.  HENT: Negative for congestion and rhinorrhea.   Respiratory: Positive for wheezing. Negative for cough, chest tightness and shortness of breath.   Cardiovascular: Negative for chest pain and leg swelling.  Gastrointestinal: Negative for abdominal pain and nausea.  Skin: Negative for color change.  Neurological: Negative for dizziness and headaches.  Psychiatric/Behavioral: Negative for agitation and behavioral problems.       Objective:   Physical Exam  Constitutional: She appears well-nourished. No distress.  HENT:  Head: Normocephalic and atraumatic.  Eyes: Right eye exhibits no discharge. Left eye exhibits no discharge.  Neck: No tracheal deviation present.  Cardiovascular: Normal rate, regular rhythm and normal heart sounds.  No murmur heard. Pulmonary/Chest: Effort normal and breath sounds normal. No respiratory distress.  Musculoskeletal: She exhibits no edema.  Lymphadenopathy:    She has no cervical adenopathy.  Neurological: She is alert. Coordination normal.  Skin: Skin is warm and dry.  Psychiatric: She has a normal mood and affect. Her behavior is normal.  Vitals  reviewed.         Assessment & Plan:  Blood pressure shows 142/68 HTN- Patient was seen today as part of a visit regarding hypertension. The importance of healthy diet and regular physical activity was discussed. The importance of compliance with medications discussed.  Ideal goal is to keep blood pressure low elevated levels certainly below 694/85 when possible.  The patient was counseled that keeping blood pressure under control lessen his risk of complications.  The importance of regular follow-ups was discussed with the patient.  Low-salt diet such as DASH recommended.  Regular physical activity was recommended as well.  Patient was advised to keep regular follow-ups.  Under guidelines for blood pressure it would be too dangerous to increase her medication because it could cause her diastolic numbers to be too low which would increase her risk of accidental falls she is to watch her diet take her medicine and exercise 3 times a week rather than twice a week  Flu shot today  Intermittent wheeze she had pulmonary function test couple years ago which overall look good I do not hear any wheezing today Albuterol prescription given just in case Chest x-ray ordered Await results If ongoing troubles referral to pulmonologist

## 2018-06-24 ENCOUNTER — Encounter: Payer: Self-pay | Admitting: Family Medicine

## 2018-06-24 ENCOUNTER — Other Ambulatory Visit: Payer: Self-pay | Admitting: *Deleted

## 2018-06-24 ENCOUNTER — Ambulatory Visit (HOSPITAL_COMMUNITY)
Admission: RE | Admit: 2018-06-24 | Discharge: 2018-06-24 | Disposition: A | Discharge status: Home / Self Care | Payer: Medicare Other | Source: Ambulatory Visit | Attending: Family Medicine | Admitting: Family Medicine

## 2018-06-24 DIAGNOSIS — R05 Cough: Secondary | ICD-10-CM | POA: Insufficient documentation

## 2018-06-24 DIAGNOSIS — R062 Wheezing: Secondary | ICD-10-CM

## 2018-07-12 ENCOUNTER — Ambulatory Visit (INDEPENDENT_AMBULATORY_CARE_PROVIDER_SITE_OTHER): Payer: Medicare Other | Admitting: Pulmonary Disease

## 2018-07-12 ENCOUNTER — Encounter: Payer: Self-pay | Admitting: Pulmonary Disease

## 2018-07-12 ENCOUNTER — Other Ambulatory Visit (INDEPENDENT_AMBULATORY_CARE_PROVIDER_SITE_OTHER): Payer: Medicare Other

## 2018-07-12 ENCOUNTER — Other Ambulatory Visit: Payer: Self-pay | Admitting: Pulmonary Disease

## 2018-07-12 VITALS — BP 132/72 | Ht 64.7 in | Wt 184.4 lb

## 2018-07-12 DIAGNOSIS — R059 Cough, unspecified: Secondary | ICD-10-CM

## 2018-07-12 DIAGNOSIS — R05 Cough: Secondary | ICD-10-CM

## 2018-07-12 LAB — CBC WITH DIFFERENTIAL/PLATELET
Basophils Absolute: 0.1 10*3/uL (ref 0.0–0.1)
Basophils Relative: 1.2 % (ref 0.0–3.0)
EOS ABS: 0.4 10*3/uL (ref 0.0–0.7)
Eosinophils Relative: 5.4 % — ABNORMAL HIGH (ref 0.0–5.0)
HCT: 38 % (ref 36.0–46.0)
HEMOGLOBIN: 12.9 g/dL (ref 12.0–15.0)
Lymphocytes Relative: 29.2 % (ref 12.0–46.0)
Lymphs Abs: 2.2 10*3/uL (ref 0.7–4.0)
MCHC: 33.9 g/dL (ref 30.0–36.0)
MCV: 93.9 fl (ref 78.0–100.0)
MONO ABS: 0.8 10*3/uL (ref 0.1–1.0)
Monocytes Relative: 10.9 % (ref 3.0–12.0)
Neutro Abs: 4 10*3/uL (ref 1.4–7.7)
Neutrophils Relative %: 53.3 % (ref 43.0–77.0)
Platelets: 192 10*3/uL (ref 150.0–400.0)
RBC: 4.04 Mil/uL (ref 3.87–5.11)
RDW: 14.5 % (ref 11.5–15.5)
WBC: 7.4 10*3/uL (ref 4.0–10.5)

## 2018-07-12 LAB — NITRIC OXIDE: NITRIC OXIDE: 35

## 2018-07-12 MED ORDER — PANTOPRAZOLE SODIUM 40 MG PO TBEC: 40.0000 mg | DELAYED_RELEASE_TABLET | Freq: Two times a day (BID) | ORAL | 1 refills | Status: DC

## 2018-07-12 MED ORDER — FLUTICASONE FUROATE-VILANTEROL 200-25 MCG/INH IN AEPB: 1.0000 | INHALATION_SPRAY | Freq: Every day | RESPIRATORY_TRACT | 0 refills | Status: AC

## 2018-07-12 MED ORDER — FLUTICASONE FUROATE-VILANTEROL 200-25 MCG/INH IN AEPB: 1.0000 | INHALATION_SPRAY | Freq: Every day | RESPIRATORY_TRACT | 4 refills | Status: AC

## 2018-07-12 NOTE — Patient Instructions (Signed)
Will increase your antiacid medication to 40 mg of Protonix twice daily We will start a medication called Breo 200  We will check CBC with differential, IgE, pulmonary function tests Follow-up in 2 to 4 weeks.

## 2018-07-12 NOTE — Progress Notes (Signed)
Melissa Barajas    [id number]    03/12/1941  Primary Care Physician:Luking, Elayne Snare, MD  Referring Physician: Kathyrn Drown, MD [place], [place] [address]  Chief complaint: Consult for cough, dyspnea, wheezing  HPI: 77 year old with history of arthritis, atrial fibrillation, collagen vascular disorder, GERD  Complains of wheezing, dry cough with mild dyspnea on exertion for the past several years.  Symptoms are increased with activity.  She has minimal symptoms at rest.  She has been given albuterol inhaler by primary care but has not started using it yet. Significant history of acid reflux.  She is using Prilosec over-the-counter once a day Diagnosed with rheumatoid arthritis at age 78.  She is not on any treatment for this except for over-the-counter NSAIDs.  Pets: Has a cat, no birds, farm animals Occupation: Used to work in a Sales promotion account executive. Currently retired Exposures: No known exposures, no mold, hot tub, Jacuzzi Smoking history: 15-pack-year history.  Quit in 1980 Travel history: No significant travel history Relevant family history: No significant family history of lung disease  Outpatient Encounter Medications as of 07/12/2018  Medication Sig  . acetaminophen (TYLENOL) 500 MG tablet Take 1,000 mg by mouth 2 (two) times daily as needed for moderate pain or headache.  . albuterol (PROVENTIL HFA;VENTOLIN HFA) 108 (90 Base) MCG/ACT inhaler Inhale 2 puffs into the lungs every 4 (four) hours as needed for wheezing or shortness of breath.  . diltiazem (CARTIA XT) 300 MG 24 hr capsule TAKE 1 CAPSULE BY MOUTH EVERY DAY  . etodolac (LODINE) 400 MG tablet TK 1 T PO BID  . OVER THE COUNTER MEDICATION Take 4 tablets by mouth daily. Focus factor   . Polyethyl Glycol-Propyl Glycol (SYSTANE OP) Place 2 drops into both eyes daily.  . potassium chloride (K-DUR,KLOR-CON) 10 MEQ tablet TAKE 1 TABLET BY MOUTH  TWICE DAILY  . pravastatin (PRAVACHOL) 80  MG tablet TAKE 1 TABLET(80 MG) BY MOUTH DAILY  . Probiotic Product (PROBIOTIC DAILY PO) Take 1 tablet by mouth daily.    No facility-administered encounter medications on file as of 07/12/2018.     Allergies as of 07/12/2018 - Review Complete 07/12/2018  Allergen Reaction Noted  . Aricept [donepezil hcl]  11/20/2017  . Hydrocodone Nausea Only 01/08/2013  . Lisinopril  01/08/2013  . Naprosyn [naproxen]  04/06/2016  . Vioxx [rofecoxib]  01/08/2013    Past Medical History:  Diagnosis Date  . Arthritis   . Atrial fibrillation ([place])   . Collagen vascular disease ([place])   . DIVERTICULOSIS OF COLON 08/25/2010   Qualifier: Diagnosis of  By: Hoy Morn    . GERD (gastroesophageal reflux disease)   . HEMORRHOIDS, INTERNAL 08/25/2010   Qualifier: Diagnosis of  By: Hoy Morn    . Hypercholesteremia   . Hypertension   . Lipoma of arm    Left    Past Surgical History:  Procedure Laterality Date  . ABDOMINAL HYSTERECTOMY     Partial  . COLONOSCOPY  2009   SLF: 1. screening colonoscopy in 10 years 2. She should follow a high fiber diet She has been given a hand out on high fiber diverticulosois and hemorrhoids  . COLONOSCOPY N/A 02/07/2018   Procedure: COLONOSCOPY;  Surgeon: Danie Binder, MD;  [facility];  Service: Endoscopy;  Laterality: N/A;  1:00pm  . CYSTECTOMY     Left breast  . ESOPHAGOGASTRODUODENOSCOPY     [id number] appearing  Schatzki's ring, possible cervical esophageal web, both dilated with passage of a Maloney dilator/small hiatal hernia  . ESOPHAGOGASTRODUODENOSCOPY N/A 10/25/2015   Procedure: ESOPHAGOGASTRODUODENOSCOPY (EGD);  Surgeon: Danie Binder, MD;  [facility]: [facility];  Service: Endoscopy;  Laterality: N/A;  1215pm  . POLYPECTOMY  02/07/2018   Procedure: POLYPECTOMY;  Surgeon: Danie Binder, MD;  [facility];  Service: Endoscopy;;  ascending colon, descending, sigmoid  . SAVORY DILATION N/A 10/25/2015   Procedure:  SAVORY DILATION;  Surgeon: Danie Binder, MD;  [facility]: [facility];  Service: Endoscopy;  Laterality: N/A;  . THYROIDECTOMY    . Tumor   05/2010   Left breast under nipple  . VESICOVAGINAL FISTULA CLOSURE W/ TAH     fibroid tumors    Family History  Problem Relation Age of Onset  . Cancer Brother        Prostate    Social History   Socioeconomic History  . Marital status: Married    Spouse name: Not on file  . Number of children: Not on file  . Years of education: Not on file  . Highest education level: Not on file  Occupational History  . Not on file  Social Needs  . Financial resource strain: Not on file  . Food insecurity:    Worry: Not on file    Inability: Not on file  . Transportation needs:    Medical: Not on file    Non-medical: Not on file  Tobacco Use  . Smoking status: Former Smoker    Packs/day: 1.00    Years: 15.00    Pack years: 15.00    Types: Cigarettes    Start date: 09/12/1983    Last attempt to quit: 09/11/1993    Years since quitting: 24.8  . Smokeless tobacco: Never Used  Substance and Sexual Activity  . Alcohol use: No    Alcohol/week: 0.0 standard drinks  . Drug use: No  . Sexual activity: Not on file  Lifestyle  . Physical activity:    Days per week: Not on file    Minutes per session: Not on file  . Stress: Not on file  Relationships  . Social connections:    Talks on phone: Not on file    Gets together: Not on file    Attends religious service: Not on file    Active member of club or organization: Not on file    Attends meetings of clubs or organizations: Not on file    Relationship status: Not on file  . Intimate partner violence:    Fear of current or ex partner: Not on file    Emotionally abused: Not on file    Physically abused: Not on file    Forced sexual activity: Not on file  Other Topics Concern  . Not on file  Social History Narrative  . Not on file   Review of systems: Review of Systems  Constitutional:  Negative for fever and chills.  HENT: Negative.   Eyes: Negative for blurred vision.  Respiratory: as per HPI  Cardiovascular: Negative for chest pain and palpitations.  Gastrointestinal: Negative for vomiting, diarrhea, blood per rectum. Genitourinary: Negative for dysuria, urgency, frequency and hematuria.  Musculoskeletal: Negative for myalgias, back pain and joint pain.  Skin: Negative for itching and rash.  Neurological: Negative for dizziness, tremors, focal weakness, seizures and loss of consciousness.  Endo/Heme/Allergies: Negative for environmental allergies.  Psychiatric/Behavioral: Negative for depression, suicidal ideas and hallucinations.  All other  systems reviewed and are negative.  Physical Exam: Blood pressure 132/72, height 5' 4.7" (1.643 m), weight 184 lb 6.4 oz (83.6 kg). Gen:      No acute distress HEENT:  EOMI, sclera anicteric Neck:     No masses; no thyromegaly Lungs:    Bilateral expiratory wheeze.  More prominent in the upper chest and throat. CV:         Regular rate and rhythm; no murmurs Abd:      + bowel sounds; soft, non-tender; no palpable masses, no distension Ext:    No edema; adequate peripheral perfusion Skin:      Warm and dry; no rash Neuro: alert and oriented x 3 Psych: normal mood and affect  Data Reviewed: Imaging: Chest x-ray 06/24/2018- surgical clips in the right cervical region.  Lungs are clear with no acute abnormality Have reviewed the images personally.  PFTs: 11/12/2015 FVC 1.96 [100%), FEV1 1.69 [85%), F/F 85, TLC 77%, DLCO 53%, DLCO/VA 84% Mild restriction.  Moderate diffusion impairment which corrects for alveolar volume.  FENO 07/12/2018-35  Labs: CBC 12/22/2016-WBC 7.1, eos 5%, absolute eosinophil count 355  Immunoglobulins 02/01/2018-IgG 1432, IgM 38, IgA 212  Assessment:  Evaluation for cough, dyspnea Likely has asthma based on elevated FENO, peripheral blood eosinophilia Wheeze is mostly from upper airway.  Suspect  element of vocal cord dysfunction and upper airway irritation from GERD Prior PFTs show restriction on diffusion impairment which corrects for alveolar volume.  This is from body habitus.  There is no evidence of interstitial lung disease on lung imaging.  Evaluate with CBC differential, IgE, PFTs Start Breo inhaler, continue albuterol as needed  Acid reflux Increase Protonix to 40 mg twice daily as she will need better control of symptoms  Plan/Recommendations: - CBC, IgE, PFTs - Start Breo - Protonix 40 mg twice daily  Marshell Garfinkel MD [facility] 07/12/2018, 2:20 PM  CC: Kathyrn Drown, MD

## 2018-07-15 LAB — IGE: IGE (IMMUNOGLOBULIN E), SERUM: 31 kU/L (ref <=114)

## 2018-07-22 ENCOUNTER — Telehealth: Payer: Self-pay | Admitting: Pulmonary Disease

## 2018-07-22 DIAGNOSIS — R059 Cough, unspecified: Secondary | ICD-10-CM

## 2018-07-22 DIAGNOSIS — R05 Cough: Secondary | ICD-10-CM

## 2018-07-22 MED ORDER — PANTOPRAZOLE SODIUM 40 MG PO TBEC: DELAYED_RELEASE_TABLET | ORAL | 0 refills | Status: DC

## 2018-07-22 MED ORDER — ALBUTEROL SULFATE HFA 108 (90 BASE) MCG/ACT IN AERS: 2.0000 | INHALATION_SPRAY | RESPIRATORY_TRACT | 5 refills | Status: DC | PRN

## 2018-07-22 NOTE — Telephone Encounter (Signed)
Called and spoke to patient, requesting refill on protonix and proventil be sent to [facility] in [place]. Informed patient I was sending refills now and to check with pharmacy later. Voiced understanding, expressed thanks. Nothing further is needed at this time.

## 2018-07-29 ENCOUNTER — Other Ambulatory Visit: Payer: Self-pay

## 2018-08-28 ENCOUNTER — Ambulatory Visit (INDEPENDENT_AMBULATORY_CARE_PROVIDER_SITE_OTHER): Payer: Medicare Other | Admitting: Pulmonary Disease

## 2018-08-28 ENCOUNTER — Encounter: Payer: Self-pay | Admitting: Pulmonary Disease

## 2018-08-28 VITALS — BP 124/70 | HR 71 | Ht 65.0 in | Wt 184.4 lb

## 2018-08-28 DIAGNOSIS — R059 Cough, unspecified: Secondary | ICD-10-CM

## 2018-08-28 DIAGNOSIS — J4540 Moderate persistent asthma, uncomplicated: Secondary | ICD-10-CM | POA: Diagnosis not present

## 2018-08-28 DIAGNOSIS — R05 Cough: Secondary | ICD-10-CM

## 2018-08-28 LAB — PULMONARY FUNCTION TEST
DL/VA % PRED: 96 %
DL/VA: 4.86 ml/min/mmHg/L
DLCO UNC % PRED: 60 %
DLCO UNC: 16.18 ml/min/mmHg
FEF 25-75 POST: 1.15 L/s
FEF 25-75 PRE: 1.82 L/s
FEF2575-%Change-Post: -36 %
FEF2575-%PRED-PRE: 110 %
FEF2575-%Pred-Post: 69 %
FEV1-%Change-Post: -12 %
FEV1-%Pred-Post: 65 %
FEV1-%Pred-Pre: 74 %
FEV1-Post: 1.23 L
FEV1-Pre: 1.4 L
FEV1FVC-%CHANGE-POST: 0 %
FEV1FVC-%Pred-Pre: 116 %
FEV6-%CHANGE-POST: -12 %
FEV6-%PRED-POST: 59 %
FEV6-%Pred-Pre: 67 %
FEV6-POST: 1.38 L
FEV6-Pre: 1.57 L
FEV6FVC-%PRED-POST: 104 %
FEV6FVC-%PRED-PRE: 104 %
FVC-%Change-Post: -12 %
FVC-%Pred-Post: 56 %
FVC-%Pred-Pre: 64 %
FVC-Post: 1.38 L
FVC-Pre: 1.57 L
POST FEV6/FVC RATIO: 100 %
PRE FEV1/FVC RATIO: 89 %
Post FEV1/FVC ratio: 89 %
Pre FEV6/FVC Ratio: 100 %
RV % pred: 77 %
RV: 1.85 L
TLC % PRED: 71 %
TLC: 3.84 L

## 2018-08-28 NOTE — Progress Notes (Signed)
PFT completed 08/28/18  

## 2018-08-28 NOTE — Progress Notes (Signed)
Melissa Barajas    [id number]    11-23-1940  Primary Care Physician:Luking, Elayne Snare, MD  Referring Physician: Kathyrn Drown, MD [address] [place], [place] [address]  Chief complaint: Consult for cough, dyspnea, wheezing  HPI: 77 year old with history of arthritis, atrial fibrillation, collagen vascular disorder, GERD  Complains of wheezing, dry cough with mild dyspnea on exertion for the past several years.  Symptoms are increased with activity.  She has minimal symptoms at rest.  She has been given albuterol inhaler by primary care but has not started using it yet. Significant history of acid reflux.  She is using Prilosec over-the-counter once a day Diagnosed with rheumatoid arthritis at age 88.  She is not on any treatment for this except for over-the-counter NSAIDs.  Pets: Has a cat, no birds, farm animals Occupation: Used to work in a Sales promotion account executive. Currently retired Exposures: No known exposures, no mold, hot tub, Jacuzzi Smoking history: 15-pack-year history.  Quit in 1980 Travel history: No significant travel history Relevant family history: No significant family history of lung disease  Interim history: Started on Breo and Protonix at last visit.  She states that the cough has resolved Dyspnea is better as well.  Outpatient Encounter Medications as of 08/28/2018  Medication Sig  . acetaminophen (TYLENOL) 500 MG tablet Take 1,000 mg by mouth 2 (two) times daily as needed for moderate pain or headache.  . albuterol (PROVENTIL HFA;VENTOLIN HFA) 108 (90 Base) MCG/ACT inhaler Inhale 2 puffs into the lungs every 4 (four) hours as needed for wheezing or shortness of breath.  . diltiazem (CARTIA XT) 300 MG 24 hr capsule TAKE 1 CAPSULE BY MOUTH EVERY DAY  . etodolac (LODINE) 400 MG tablet TK 1 T PO BID  . OVER THE COUNTER MEDICATION Take 4 tablets by mouth daily. Focus factor   . pantoprazole (PROTONIX) 40 MG tablet TAKE 1 TABLET(40 MG) BY MOUTH  TWICE DAILY  . Polyethyl Glycol-Propyl Glycol (SYSTANE OP) Place 2 drops into both eyes daily.  . potassium chloride (K-DUR,KLOR-CON) 10 MEQ tablet TAKE 1 TABLET BY MOUTH  TWICE DAILY  . pravastatin (PRAVACHOL) 80 MG tablet TAKE 1 TABLET(80 MG) BY MOUTH DAILY  . Probiotic Product (PROBIOTIC DAILY PO) Take 1 tablet by mouth daily.    No facility-administered encounter medications on file as of 08/28/2018.    Physical Exam: Blood pressure 124/70, pulse 71, height 5\' 5"  (1.651 m), weight 184 lb 6.4 oz (83.6 kg), SpO2 94 %. Gen:      No acute distress HEENT:  EOMI, sclera anicteric Neck:     No masses; no thyromegaly Lungs:    Clear to auscultation bilaterally; normal respiratory effort CV:         Regular rate and rhythm; no murmurs Abd:      + bowel sounds; soft, non-tender; no palpable masses, no distension Ext:    No edema; adequate peripheral perfusion Skin:      Warm and dry; no rash Neuro: alert and oriented x 3 Psych: normal mood and affect  Data Reviewed: Imaging: Chest x-ray 06/24/2018- surgical clips in the right cervical region.  Lungs are clear with no acute abnormality Have reviewed the images personally.  PFTs: 11/12/2015 FVC 1.96 [100%), FEV1 1.69 [85%), F/F 85, TLC 77%, DLCO 53%, DLCO/VA 84% Mild restriction.  Moderate diffusion impairment which corrects for alveolar volume.  08/28/2018 FVC 1.38 [56%), FEV1 1.23 [65%],/F 89, TLC 71%, DLCO 60% Mild  restriction, moderate diffusion defect that corrects for alveolar volume.  FENO 07/12/2018-35  Labs: CBC 12/22/2016-WBC 7.1, eos 5%, absolute eosinophil count 355 CBC 07/12/2018-WBC 7.4, eos 4.4%, absolute eosinophil count 325 IgE 07/12/2018-31  Immunoglobulins 02/01/2018-IgG 1432, IgM 38, IgA 212  Assessment:  Moderate asthma Likely has asthma based on elevated FENO, peripheral blood eosinophilia Symptoms are better on Breo continue the same.  Suspect element of vocal cord dysfunction and upper airway irritation from  GERD Prior PFTs show restriction on diffusion impairment which corrects for alveolar volume.  This is from body habitus.  There is no evidence of interstitial lung disease on lung imaging.  Acid reflux Continue Protonix.  Plan/Recommendations: - Continue Breo, Protonix.  Marshell Garfinkel MD [facility] 08/28/2018, 10:13 AM  CC: Kathyrn Drown, MD

## 2018-08-28 NOTE — Patient Instructions (Signed)
I am glad you are doing well with regard to the breathing We will continue your medications as prescribed Follow-up in 6 months.

## 2018-09-03 ENCOUNTER — Encounter: Payer: Self-pay | Admitting: Gastroenterology

## 2018-09-05 ENCOUNTER — Emergency Department (HOSPITAL_COMMUNITY): Payer: Medicare Other

## 2018-09-05 ENCOUNTER — Other Ambulatory Visit: Payer: Self-pay

## 2018-09-05 ENCOUNTER — Emergency Department (HOSPITAL_COMMUNITY)
Admission: EM | Admit: 2018-09-05 | Discharge: 2018-09-05 | Disposition: A | Discharge status: Home / Self Care | Payer: Medicare Other | Attending: Emergency Medicine | Admitting: Emergency Medicine

## 2018-09-05 ENCOUNTER — Encounter (HOSPITAL_COMMUNITY): Payer: Self-pay | Admitting: *Deleted

## 2018-09-05 DIAGNOSIS — M549 Dorsalgia, unspecified: Secondary | ICD-10-CM | POA: Insufficient documentation

## 2018-09-05 DIAGNOSIS — Z87891 Personal history of nicotine dependence: Secondary | ICD-10-CM | POA: Diagnosis not present

## 2018-09-05 DIAGNOSIS — Z79899 Other long term (current) drug therapy: Secondary | ICD-10-CM | POA: Insufficient documentation

## 2018-09-05 DIAGNOSIS — I4510 Unspecified right bundle-branch block: Secondary | ICD-10-CM | POA: Diagnosis not present

## 2018-09-05 DIAGNOSIS — R05 Cough: Secondary | ICD-10-CM | POA: Diagnosis not present

## 2018-09-05 DIAGNOSIS — I10 Essential (primary) hypertension: Secondary | ICD-10-CM | POA: Diagnosis not present

## 2018-09-05 DIAGNOSIS — R079 Chest pain, unspecified: Secondary | ICD-10-CM | POA: Diagnosis not present

## 2018-09-05 MED ORDER — METHOCARBAMOL 500 MG PO TABS: 500.0000 mg | ORAL_TABLET | Freq: Three times a day (TID) | ORAL | 0 refills | Status: DC | PRN

## 2018-09-05 MED ORDER — DEXAMETHASONE SODIUM PHOSPHATE 10 MG/ML IJ SOLN
10.0000 mg | Freq: Once | INTRAMUSCULAR | Status: AC
  Administered 2018-09-05: 10 mg via INTRAMUSCULAR
  Filled 2018-09-05: qty 1

## 2018-09-05 MED ORDER — CYCLOBENZAPRINE HCL 10 MG PO TABS
5.0000 mg | ORAL_TABLET | Freq: Once | ORAL | Status: AC
  Administered 2018-09-05: 5 mg via ORAL
  Filled 2018-09-05: qty 1

## 2018-09-05 MED ORDER — PREDNISONE 20 MG PO TABS: ORAL_TABLET | ORAL | 0 refills | Status: DC

## 2018-09-05 NOTE — ED Provider Notes (Signed)
[facility] EMERGENCY DEPARTMENT Provider Note   CSN: [id number] Arrival date & time: 09/05/18  0010  Time seen 4:00 a.m.   History   Chief Complaint Chief Complaint  Patient presents with  . Back Pain    HPI Melissa Barajas is a 77 y.o. female.  HPI patient states about 6 PM last night she started getting pain between her shoulder blades and now it is mainly under her right shoulder blade.  She states she is never had it before.  She states the pain was lasting 1 to 2 minutes and now it is only lasting a few seconds and she describes it as a grabbing pain.  She states it has happened twice while we were talking.  She states she was just walking to her bedroom when the pain started.  She states about an hour later she had some cramping in her left thigh that lasted 5 to 6 minutes.  About 10 PM she took an Aleve and then at 11 PM she went to bed and the pain had eased up.  However it woke her up later with cramping in her right thigh.  She denies any injury or change in her activity.  She states she had some chest pain just before she left her house that was normal left side that she has had before a few times and was told it was gas.  She denies any shortness of breath, she did have some nausea without vomiting.  She states she has had a cough all this week and all last week without fever.  She is coughing up mucus that she does not look at, she has rhinorrhea, she had sore throat the first day but then it left.  She states she had the flu shot in October.  She has a history of irregular heartbeat and is not on a blood thinner.  She denies diabetes.  PCP Kathyrn Drown, MD  Cardiologist Dr. Bronson Ing  Past Medical History:  Diagnosis Date  . Arthritis   . Atrial fibrillation ([place])   . Collagen vascular disease ([place])   . DIVERTICULOSIS OF COLON 08/25/2010   Qualifier: Diagnosis of  By: Hoy Morn    . GERD (gastroesophageal reflux disease)   . HEMORRHOIDS, INTERNAL 08/25/2010   Qualifier: Diagnosis of  By: Hoy Morn    . Hypercholesteremia   . Hypertension   . Lipoma of arm    Left    Patient Active Problem List   Diagnosis Date Noted  . Rectal bleeding   . Hypercalcemia 02/01/2018  . BRBPR (bright red blood per rectum) 11/29/2017  . Cognitive dysfunction 10/30/2017  . Dysphagia, pharyngeal 05/24/2017  . Hyperglycemia 10/12/2014  . Ganglion cyst 09/26/2013  . 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 Urge incontinence 03/25/2013  . Functional constipation 11/20/2012  . Hyperlipidemia 08/25/2010  . Essential hypertension 08/25/2010  . GERD 08/25/2010    Past Surgical History:  Procedure Laterality Date  . ABDOMINAL HYSTERECTOMY     Partial  . COLONOSCOPY  2009   SLF: 1. screening colonoscopy in 10 years 2. She should follow a high fiber diet She has been given a hand out on high fiber diverticulosois and hemorrhoids  . COLONOSCOPY N/A 02/07/2018   Procedure: COLONOSCOPY;  Surgeon: Danie Binder, MD;  [facility];  Service: Endoscopy;  Laterality: N/A;  1:00pm  . CYSTECTOMY     Left breast  . ESOPHAGOGASTRODUODENOSCOPY     VEH:[id number] appearing Schatzki's ring,  possible cervical esophageal web, both dilated with passage of a Maloney dilator/small hiatal hernia  . ESOPHAGOGASTRODUODENOSCOPY N/A 10/25/2015   Procedure: ESOPHAGOGASTRODUODENOSCOPY (EGD);  Surgeon: Danie Binder, MD;  Location[facility];  Service: Endoscopy;  Laterality: N/A;  1215pm  . POLYPECTOMY  02/07/2018   Procedure: POLYPECTOMY;  Surgeon: Danie Binder, MD;  [facility];  Service: Endoscopy;;  ascending colon, descending, sigmoid  . SAVORY DILATION N/A 10/25/2015   Procedure: SAVORY DILATION;  Surgeon: Danie Binder, MD;  Location: [facility];  Service: Endoscopy;  Laterality: N/A;  . THYROIDECTOMY    . Tumor   05/2010   Left breast under nipple  . VESICOVAGINAL FISTULA CLOSURE W/ TAH     fibroid tumors     OB  History    Gravida  6   Para      Term      Preterm      AB  2   Living        SAB      TAB      Ectopic      Multiple      Live Births               Home Medications    Prior to Admission medications   Medication Sig Start Date End Date Taking? Authorizing Provider  acetaminophen (TYLENOL) 500 MG tablet Take 1,000 mg by mouth 2 (two) times daily as needed for moderate pain or headache.    [provider]  albuterol (PROVENTIL HFA;VENTOLIN HFA) 108 (90 Base) MCG/ACT inhaler Inhale 2 puffs into the lungs every 4 (four) hours as needed for wheezing or shortness of breath. 07/22/18   Mannam, Hart Robinsons, MD  diltiazem (CARTIA XT) 300 MG 24 hr capsule TAKE 1 CAPSULE BY MOUTH EVERY DAY 05/06/18   Kathyrn Drown, MD  etodolac (LODINE) 400 MG tablet TK 1 T PO BID 05/06/18   Luking, Elayne Snare, MD  methocarbamol (ROBAXIN) 500 MG tablet Take 1 tablet (500 mg total) by mouth every 8 (eight) hours as needed for muscle spasms. 09/05/18   Rolland Porter, MD  OVER THE COUNTER MEDICATION Take 4 tablets by mouth daily. Focus factor     [provider]  pantoprazole (PROTONIX) 40 MG tablet TAKE 1 TABLET(40 MG) BY MOUTH TWICE DAILY 07/22/18   Mannam, Praveen, MD  Polyethyl Glycol-Propyl Glycol (SYSTANE OP) Place 2 drops into both eyes daily.    [provider]  potassium chloride (K-DUR,KLOR-CON) 10 MEQ tablet TAKE 1 TABLET BY MOUTH  TWICE DAILY 05/27/18   [name], MD  pravastatin (PRAVACHOL) 80 MG tablet TAKE 1 TABLET(80 MG) BY MOUTH DAILY 05/06/18   Kathyrn Drown, MD  predniSONE (DELTASONE) 20 MG tablet Take 3 po QD x 3d , then 2 po QD x 3d then 1 po QD x 3d 09/05/18   Rolland Porter, MD  Probiotic Product (PROBIOTIC DAILY PO) Take 1 tablet by mouth daily.     [provider]    Family History Family History  Problem Relation Age of Onset  . Cancer Brother        Prostate    Social History Social History   Tobacco Use  . Smoking status: Former  Smoker    Packs/day: 1.00    Years: 15.00    Pack years: 15.00    Types: Cigarettes    Start date: 09/12/1983    Last attempt to quit: 09/11/1993    Years  since quitting: 25.0  . Smokeless tobacco: Never Used  Substance Use Topics  . Alcohol use: No    Alcohol/week: 0.0 standard drinks  . Drug use: No     Allergies   Aricept [donepezil hcl]; Hydrocodone; Lisinopril; Naprosyn [naproxen]; and Vioxx [rofecoxib]   Review of Systems Review of Systems  All other systems reviewed and are negative.    Physical Exam Updated Vital Signs BP (!) 148/56   Pulse (!) 59   Temp 97.8 F (36.6 C) (Oral)   Resp 18   Ht 5\' 5"  (1.651 m)   Wt 83.6 kg   SpO2 94%   BMI 30.67 kg/m   Physical Exam Vitals signs and nursing note reviewed.  Constitutional:      General: She is not in acute distress.    Appearance: Normal appearance. She is well-developed. She is not ill-appearing or toxic-appearing.  HENT:     Head: Normocephalic and atraumatic.     Right Ear: External ear normal.     Left Ear: External ear normal.     Nose: Nose normal. No mucosal edema or rhinorrhea.     Mouth/Throat:     Dentition: No dental abscesses.     Pharynx: No uvula swelling.  Eyes:     Conjunctiva/sclera: Conjunctivae normal.     Pupils: Pupils are equal, round, and reactive to light.  Neck:     Musculoskeletal: Full passive range of motion without pain, normal range of motion and neck supple.  Cardiovascular:     Rate and Rhythm: Normal rate and regular rhythm.     Heart sounds: Normal heart sounds. No murmur. No friction rub. No gallop.   Pulmonary:     Effort: Pulmonary effort is normal. No respiratory distress.     Breath sounds: Normal breath sounds. No wheezing, rhonchi or rales.       Comments: She has tenderness along the inferior edge of both scapula but it is worse on the right.  She denies pain with range of motion of her right arm. Chest:     Chest wall: No tenderness or crepitus.    Abdominal:     General: Bowel sounds are normal. There is no distension.     Palpations: Abdomen is soft.     Tenderness: There is no abdominal tenderness. There is no guarding or rebound.  Musculoskeletal: Normal range of motion.        General: No tenderness.     Comments: Moves all extremities well.  Patient has 2+ dorsalis pedis pulses, and femoral pulses.  Her feet are warm and not cold to touch.  Skin:    General: Skin is warm and dry.     Coloration: Skin is not pale.     Findings: No erythema or rash.  Neurological:     Mental Status: She is alert and oriented to person, place, and time.     Cranial Nerves: No cranial nerve deficit.  Psychiatric:        Mood and Affect: Mood is not anxious.        Speech: Speech normal.        Behavior: Behavior normal.      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 Both lungs are clear. The visualized skeletal structures are unremarkable. IMPRESSION: No active cardiopulmonary disease. Electronically Signed   By: Inez Catalina M.D.   On: 09/05/2018 02:37    Procedures Procedures (including critical care time)  Medications Ordered in ED Medications  dexamethasone (DECADRON) injection 10 mg (10 mg Intramuscular Given 09/05/18 0431)  cyclobenzaprine (FLEXERIL) tablet 5 mg (5 mg Oral Given  09/05/18 0431)     Initial Impression / Assessment and Plan / ED Course  I have reviewed the triage vital signs and the nursing notes.  Pertinent labs & imaging results that were available during my care of the patient were reviewed by me and considered in my medical decision making (see chart for details).     I briefly entertain the thought of a aneurysm however her pain would be extremely atypical for that.  Her chest x-ray also showed normal aortic size.  Patient's last blood work was in June of this year and her BUN was 15 and her creatinine was 0.70.  She does not have diabetes.  I was going to order her Toradol however she has an allergy to other nonsteroidals so she was given Decadron IM and a oral muscle relaxer.  Recheck at 6:05 AM patient states she is feeling much better.  She is ready to be discharged.  Final Clinical Impressions(s) / ED Diagnoses   Final diagnoses:  Atypical back pain    ED Discharge Orders         Ordered    predniSONE (DELTASONE) 20 MG tablet     09/05/18 0624    methocarbamol (ROBAXIN) 500 MG tablet  Every 8 hours PRN     09/05/18 0624          Plan discharge  Rolland Porter, MD, Barbette Or, MD 09/05/18 [phone]

## 2018-09-05 NOTE — ED Triage Notes (Signed)
Pt c/o upper back pain that started at 6pm this evening; pt has multiple complaints with bilateral leg cramps and central chest pain and cough; pt states she has a productive cough

## 2018-09-05 NOTE — Discharge Instructions (Addendum)
Use ice and heat on the painful area.  Take the medication as prescribed.  Recheck if you get short of breath, fever, or pain that is constant and lasts more than 30 minutes.

## 2018-09-12 ENCOUNTER — Inpatient Hospital Stay (HOSPITAL_COMMUNITY): Payer: Medicare Other | Attending: Hematology

## 2018-09-12 DIAGNOSIS — Z90710 Acquired absence of both cervix and uterus: Secondary | ICD-10-CM | POA: Insufficient documentation

## 2018-09-12 DIAGNOSIS — I10 Essential (primary) hypertension: Secondary | ICD-10-CM | POA: Insufficient documentation

## 2018-09-12 DIAGNOSIS — Z79899 Other long term (current) drug therapy: Secondary | ICD-10-CM | POA: Insufficient documentation

## 2018-09-12 DIAGNOSIS — Z87891 Personal history of nicotine dependence: Secondary | ICD-10-CM | POA: Diagnosis not present

## 2018-09-12 LAB — COMPREHENSIVE METABOLIC PANEL
ALT: 23 U/L (ref 0–44)
AST: 24 U/L (ref 15–41)
Albumin: 3.6 g/dL (ref 3.5–5.0)
Alkaline Phosphatase: 73 U/L (ref 38–126)
Anion gap: 8 (ref 5–15)
BUN: 20 mg/dL (ref 8–23)
CO2: 28 mmol/L (ref 22–32)
Calcium: 9.3 mg/dL (ref 8.9–10.3)
Chloride: 99 mmol/L (ref 98–111)
Creatinine, Ser: 0.88 mg/dL (ref 0.44–1.00)
GFR calc Af Amer: >60 mL/min (ref >60)
GFR calc non Af Amer: >60 mL/min (ref >60)
Glucose, Bld: 96 mg/dL (ref 70–99)
Potassium: 4 mmol/L (ref 3.5–5.1)
Sodium: 135 mmol/L (ref 135–145)
Total Bilirubin: 1 mg/dL (ref 0.3–1.2)
Total Protein: 7 g/dL (ref 6.5–8.1)

## 2018-09-12 LAB — CBC WITH DIFFERENTIAL/PLATELET
Abs Immature Granulocytes: 0.22 10*3/uL — ABNORMAL HIGH (ref 0.00–0.07)
Basophils Absolute: 0 10*3/uL (ref 0.0–0.1)
Basophils Relative: 0 %
Eosinophils Absolute: 0.3 10*3/uL (ref 0.0–0.5)
Eosinophils Relative: 1 %
HCT: 42.4 % (ref 36.0–46.0)
Hemoglobin: 13.4 g/dL (ref 12.0–15.0)
Immature Granulocytes: 1 %
Lymphocytes Relative: 27 %
Lymphs Abs: 5.3 10*3/uL — ABNORMAL HIGH (ref 0.7–4.0)
MCH: 30.4 pg (ref 26.0–34.0)
MCHC: 31.6 g/dL (ref 30.0–36.0)
MCV: 96.1 fL (ref 80.0–100.0)
MONO ABS: 1.4 10*3/uL — AB (ref 0.1–1.0)
Monocytes Relative: 7 %
NEUTROS ABS: 12.6 10*3/uL — AB (ref 1.7–7.7)
Neutrophils Relative %: 64 %
Platelets: 163 10*3/uL (ref 150–400)
RBC: 4.41 MIL/uL (ref 3.87–5.11)
RDW: 14.7 % (ref 11.5–15.5)
WBC: 19.9 10*3/uL — ABNORMAL HIGH (ref 4.0–10.5)
nRBC: 0.1 % (ref 0.0–0.2)

## 2018-09-13 LAB — PROTEIN ELECTROPHORESIS, SERUM
A/G Ratio: 1.2 (ref 0.7–1.7)
Albumin ELP: 3.7 g/dL (ref 2.9–4.4)
Alpha-1-Globulin: 0.1 g/dL (ref 0.0–0.4)
Alpha-2-Globulin: 1 g/dL (ref 0.4–1.0)
Beta Globulin: 0.9 g/dL (ref 0.7–1.3)
Gamma Globulin: 1 g/dL (ref 0.4–1.8)
Globulin, Total: 3 g/dL (ref 2.2–3.9)
Total Protein ELP: 6.7 g/dL (ref 6.0–8.5)

## 2018-09-13 LAB — KAPPA/LAMBDA LIGHT CHAINS
Kappa free light chain: 15.1 mg/L (ref 3.3–19.4)
Kappa, lambda light chain ratio: 1.57 (ref 0.26–1.65)
Lambda free light chains: 9.6 mg/L (ref 5.7–26.3)

## 2018-09-14 ENCOUNTER — Other Ambulatory Visit: Payer: Self-pay | Admitting: Family Medicine

## 2018-09-16 LAB — IMMUNOFIXATION ELECTROPHORESIS
IGA: 304 mg/dL (ref 64–422)
IgG (Immunoglobin G), Serum: 1209 mg/dL (ref 700–1600)
IgM (Immunoglobulin M), Srm: 39 mg/dL (ref 26–217)
Total Protein ELP: 6.3 g/dL (ref 6.0–8.5)

## 2018-09-17 ENCOUNTER — Inpatient Hospital Stay (HOSPITAL_BASED_OUTPATIENT_CLINIC_OR_DEPARTMENT_OTHER): Payer: Medicare Other | Admitting: Hematology

## 2018-09-17 ENCOUNTER — Encounter (HOSPITAL_COMMUNITY): Payer: Self-pay | Admitting: Hematology

## 2018-09-17 DIAGNOSIS — Z87891 Personal history of nicotine dependence: Secondary | ICD-10-CM

## 2018-09-17 DIAGNOSIS — I10 Essential (primary) hypertension: Secondary | ICD-10-CM

## 2018-09-17 DIAGNOSIS — Z79899 Other long term (current) drug therapy: Secondary | ICD-10-CM | POA: Diagnosis not present

## 2018-09-17 DIAGNOSIS — Z90710 Acquired absence of both cervix and uterus: Secondary | ICD-10-CM | POA: Diagnosis not present

## 2018-09-17 NOTE — Assessment & Plan Note (Signed)
1.  Mild hypercalcemia: - She has mild hypercalcemia ranging from 10.5-10.8 from August 2018.  She reports having some kind of surgery on parathyroids about 25 years ago.  I do not have any records of it. -Excellent work-up done by Dr. Wolfgang Phoenix was negative for primary hyperparathyroidism.  PTH related peptide and TSH were also normal.  25-hydroxy vitamin D was normal.  SPEP was negative.  24-hour urine showed some elevation of free light chain ratio. - We have discontinued indapamide, on 02/01/2018 as thiazide diuretics can cause mild hypercalcemia.  She did not report any major leg swellings.  Her calcium has normalized today. - Colonoscopy on 02/07/2018 with 2 polyps in the sigmoid and descending colon remote and were hyperplastic.  There were also internal hemorrhoids and diverticulosis. - She denies any new onset bone pains.  Denies any fevers, night sweats or weight loss. -We reviewed her blood work.  Calcium was within normal limits.  SPEP, immunofixation and free light chains were also normal. - Her hypercalcemia has resolved after we discontinued indapamide.  She will be discharged from the clinic.  We will be glad to see her on an as-needed basis.

## 2018-09-17 NOTE — Patient Instructions (Signed)
[place] at [facility] Discharge Instructions  Follow up with primary care MD    Thank you for choosing [place] at [facility] to provide your oncology and hematology care.  To afford each patient quality time with our provider, please arrive at least 15 minutes before your scheduled appointment time.   If you have a lab appointment with the [facility] please come in thru the  Main Entrance and check in at the main information desk  You need to re-schedule your appointment should you arrive 10 or more minutes late.  We strive to give you quality time with our providers, and arriving late affects you and other patients whose appointments are after yours.  Also, if you no show three or more times for appointments you may be dismissed from the clinic at the providers discretion.     Again, thank you for choosing [facility].  Our hope is that these requests will decrease the amount of time that you wait before being seen by our physicians.       _____________________________________________________________  Should you have questions after your visit to [facility], please contact our office at [phone] between the hours of 8:00 a.m. and 4:30 p.m.  Voicemails left after 4:00 p.m. will not be returned until the following business day.  For prescription refill requests, have your pharmacy contact our office and allow 72 hours.    Cancer Center Support Programs:   > Cancer Support Group  2nd Tuesday of the month 1pm-2pm, Journey Room

## 2018-09-17 NOTE — Progress Notes (Signed)
[place], [place] [id number]   CLINIC:  Medical Oncology/Hematology  PCP:  Kathyrn Drown, MD [address] [place] [address] [phone]   REASON FOR VISIT: Follow-up for mild hypercalcemia.  CURRENT THERAPY: Discontinuation of indapamide.   INTERVAL HISTORY:  Ms. Nier 78 y.o. female returns for routine follow-up for mild hypercalcemia. She reports she was on a dose of prednisone she just completed it is last Friday. She has been having headaches 4-5 days a week and they are only at night. Denies any nausea, vomiting, or diarrhea. Denies any new pains. Had not noticed any recent bleeding such as epistaxis, hematuria or hematochezia. Denies recent chest pain on exertion, shortness of breath on minimal exertion, pre-syncopal episodes, or palpitations. Denies any numbness or tingling in hands or feet. Denies any recent fevers, infections, or recent hospitalizations. He reports her appetite and energy level at 100%.     REVIEW OF SYSTEMS:  Review of Systems  Neurological: Positive for headaches.  All other systems reviewed and are negative.    PAST MEDICAL/SURGICAL HISTORY:  Past Medical History:  Diagnosis Date  . Arthritis   . Atrial fibrillation ([place])   . Collagen vascular disease ([place])   . DIVERTICULOSIS OF COLON 08/25/2010   Qualifier: Diagnosis of  By: Hoy Morn    . GERD (gastroesophageal reflux disease)   . HEMORRHOIDS, INTERNAL 08/25/2010   Qualifier: Diagnosis of  By: Hoy Morn    . Hypercholesteremia   . Hypertension   . Lipoma of arm    Left   Past Surgical History:  Procedure Laterality Date  . ABDOMINAL HYSTERECTOMY     Partial  . COLONOSCOPY  2009   SLF: 1. screening colonoscopy in 10 years 2. She should follow a high fiber diet She has been given a hand out on high fiber diverticulosois and hemorrhoids  . COLONOSCOPY N/A 02/07/2018   Procedure: COLONOSCOPY;  Surgeon: Danie Binder,  MD;  [facility];  Service: Endoscopy;  Laterality: N/A;  1:00pm  . CYSTECTOMY     Left breast  . ESOPHAGOGASTRODUODENOSCOPY     PFX:[id number] appearing Schatzki's ring, possible cervical esophageal web, both dilated with passage of a Maloney dilator/small hiatal hernia  . ESOPHAGOGASTRODUODENOSCOPY N/A 10/25/2015   Procedure: ESOPHAGOGASTRODUODENOSCOPY (EGD);  Surgeon: Danie Binder, MD;  [facility];  Service: Endoscopy;  Laterality: N/A;  1215pm  . POLYPECTOMY  02/07/2018   Procedure: POLYPECTOMY;  Surgeon: Danie Binder, MD;  [facility];  Service: Endoscopy;;  ascending colon, descending, sigmoid  . SAVORY DILATION N/A 10/25/2015   Procedure: SAVORY DILATION;  Surgeon: Danie Binder, MD;  [facility];  Service: Endoscopy;  Laterality: N/A;  . THYROIDECTOMY    . Tumor   05/2010   Left breast under nipple  . VESICOVAGINAL FISTULA CLOSURE W/ TAH     fibroid tumors     SOCIAL HISTORY:  Social History   Socioeconomic History  . Marital status: Married    Spouse name: Not on file  . Number of children: Not on file  . Years of education: Not on file  . Highest education level: Not on file  Occupational History  . Not on file  Social Needs  . Financial resource strain: Not on file  . Food insecurity:    Worry: Not on file    Inability: Not on file  . Transportation needs:    Medical: Not on  file    Non-medical: Not on file  Tobacco Use  . Smoking status: Former Smoker    Packs/day: 1.00    Years: 15.00    Pack years: 15.00    Types: Cigarettes    Start date: 09/12/1983    Last attempt to quit: 09/11/1993    Years since quitting: 25.0  . Smokeless tobacco: Never Used  Substance and Sexual Activity  . Alcohol use: No    Alcohol/week: 0.0 standard drinks  . Drug use: No  . Sexual activity: Not on file  Lifestyle  . Physical activity:    Days per week: Not on file    Minutes per session: Not on file  . Stress: Not on file   Relationships  . Social connections:    Talks on phone: Not on file    Gets together: Not on file    Attends religious service: Not on file    Active member of club or organization: Not on file    Attends meetings of clubs or organizations: Not on file    Relationship status: Not on file  . Intimate partner violence:    Fear of current or ex partner: Not on file    Emotionally abused: Not on file    Physically abused: Not on file    Forced sexual activity: Not on file  Other Topics Concern  . Not on file  Social History Narrative  . Not on file    FAMILY HISTORY:  Family History  Problem Relation Age of Onset  . Cancer Brother        Prostate    CURRENT MEDICATIONS:  Outpatient Encounter Medications as of 09/17/2018  Medication Sig  . acetaminophen (TYLENOL) 500 MG tablet Take 1,000 mg by mouth 2 (two) times daily as needed for moderate pain or headache.  . albuterol (PROVENTIL HFA;VENTOLIN HFA) 108 (90 Base) MCG/ACT inhaler Inhale 2 puffs into the lungs every 4 (four) hours as needed for wheezing or shortness of breath.  . diltiazem (CARDIZEM CD) 300 MG 24 hr capsule TAKE 1 CAPSULE BY MOUTH  EVERY DAY  . etodolac (LODINE) 400 MG tablet TK 1 T PO BID  . OVER THE COUNTER MEDICATION Take 4 tablets by mouth daily. Focus factor   . pantoprazole (PROTONIX) 40 MG tablet TAKE 1 TABLET(40 MG) BY MOUTH TWICE DAILY  . Polyethyl Glycol-Propyl Glycol (SYSTANE OP) Place 2 drops into both eyes daily.  . potassium chloride (K-DUR,KLOR-CON) 10 MEQ tablet TAKE 1 TABLET BY MOUTH  TWICE DAILY  . pravastatin (PRAVACHOL) 80 MG tablet TAKE 1 TABLET BY MOUTH  DAILY  . Probiotic Product (PROBIOTIC DAILY PO) Take 1 tablet by mouth daily.   . methocarbamol (ROBAXIN) 500 MG tablet Take 1 tablet (500 mg total) by mouth every 8 (eight) hours as needed for muscle spasms. (Patient not taking: Reported on 09/17/2018)  . [DISCONTINUED] predniSONE (DELTASONE) 20 MG tablet Take 3 po QD x 3d , then 2 po QD x 3d  then 1 po QD x 3d   No facility-administered encounter medications on file as of 09/17/2018.     ALLERGIES:  Allergies  Allergen Reactions  . Aricept [Donepezil Hcl]     Sleep deprived  . Hydrocodone Nausea Only  . Lisinopril     Angio edema  . Naprosyn [Naproxen]     nausea  . Vioxx [Rofecoxib]     Unknown reaction      PHYSICAL EXAM:  ECOG Performance status: 1  Vitals:   09/17/18  1157  BP: (!) 154/80  Pulse: 89  Resp: 18  Temp: 98.8 F (37.1 C)  SpO2: 95%   Filed Weights   09/17/18 1157  Weight: 186 lb 1.6 oz (84.4 kg)    Physical Exam Constitutional:      Appearance: Normal appearance. She is normal weight.  Musculoskeletal: Normal range of motion.  Skin:    General: Skin is warm and dry.  Neurological:     Mental Status: She is alert and oriented to person, place, and time. Mental status is at baseline.  Psychiatric:        Mood and Affect: Mood normal.        Behavior: Behavior normal.        Thought Content: Thought content normal.        Judgment: Judgment normal.      LABORATORY DATA:  I have reviewed the labs as listed.  CBC    Component Value Date/Time   WBC 19.9 (H) 09/12/2018 1038   RBC 4.41 09/12/2018 1038   HGB 13.4 09/12/2018 1038   HGB 13.6 12/22/2016 0848   HCT 42.4 09/12/2018 1038   HCT 40.6 12/22/2016 0848   PLT 163 09/12/2018 1038   PLT 232 12/22/2016 0848   MCV 96.1 09/12/2018 1038   MCV 95 12/22/2016 0848   MCH 30.4 09/12/2018 1038   MCHC 31.6 09/12/2018 1038   RDW 14.7 09/12/2018 1038   RDW 14.8 12/22/2016 0848   LYMPHSABS 5.3 (H) 09/12/2018 1038   LYMPHSABS 2.5 12/22/2016 0848   MONOABS 1.4 (H) 09/12/2018 1038   EOSABS 0.3 09/12/2018 1038   EOSABS 0.3 12/22/2016 0848   BASOSABS 0.0 09/12/2018 1038   BASOSABS 0.1 12/22/2016 0848   CMP Latest Ref Rng & Units 09/12/2018 03/04/2018 12/26/2017  Glucose 70 - 99 mg/dL 96 114(H) -  BUN 8 - 23 mg/dL 20 15 -  Creatinine 0.44 - 1.00 mg/dL 0.88 0.70 -  Sodium 135 - 145 mmol/L  135 140 -  Potassium 3.5 - 5.1 mmol/L 4.0 4.0 -  Chloride 98 - 111 mmol/L 99 104 -  CO2 22 - 32 mmol/L 28 29 -  Calcium 8.9 - 10.3 mg/dL 9.3 9.4 10.5(H)  Total Protein 6.5 - 8.1 g/dL 7.0 7.1 6.9  Total Bilirubin 0.3 - 1.2 mg/dL 1.0 0.6 -  Alkaline Phos 38 - 126 U/L 73 66 -  AST 15 - 41 U/L 24 21 -  ALT 0 - 44 U/L 23 14 -       DIAGNOSTIC IMAGING:  I have independently reviewed the scans and discussed with the patient.   I have reviewed Francene Finders, NP's note and agree with the documentation.  I personally performed a face-to-face visit, made revisions and my assessment and plan is as follows.    ASSESSMENT & PLAN:   Hypercalcemia 1.  Mild hypercalcemia: - She has mild hypercalcemia ranging from 10.5-10.8 from August 2018.  She reports having some kind of surgery on parathyroids about 25 years ago.  I do not have any records of it. -Excellent work-up done by Dr. Wolfgang Phoenix was negative for primary hyperparathyroidism.  PTH related peptide and TSH were also normal.  25-hydroxy vitamin D was normal.  SPEP was negative.  24-hour urine showed some elevation of free light chain ratio. - We have discontinued indapamide, on 02/01/2018 as thiazide diuretics can cause mild hypercalcemia.  She did not report any major leg swellings.  Her calcium has normalized today. - Colonoscopy on 02/07/2018 with 2 polyps in  the sigmoid and descending colon remote and were hyperplastic.  There were also internal hemorrhoids and diverticulosis. - She denies any new onset bone pains.  Denies any fevers, night sweats or weight loss. -We reviewed her blood work.  Calcium was within normal limits.  SPEP, immunofixation and free light chains were also normal. - Her hypercalcemia has resolved after we discontinued indapamide.  She will be discharged from the clinic.  We will be glad to see her on an as-needed basis.      Orders placed this encounter:  No orders of the defined types were placed in this  encounter.     Derek Jack, MD [place] [phone]

## 2019-01-17 ENCOUNTER — Other Ambulatory Visit: Payer: Self-pay

## 2019-01-17 ENCOUNTER — Ambulatory Visit (INDEPENDENT_AMBULATORY_CARE_PROVIDER_SITE_OTHER): Payer: Medicare Other | Admitting: Family Medicine

## 2019-01-17 DIAGNOSIS — R059 Cough, unspecified: Secondary | ICD-10-CM

## 2019-01-17 DIAGNOSIS — E7849 Other hyperlipidemia: Secondary | ICD-10-CM

## 2019-01-17 DIAGNOSIS — K219 Gastro-esophageal reflux disease without esophagitis: Secondary | ICD-10-CM

## 2019-01-17 DIAGNOSIS — R05 Cough: Secondary | ICD-10-CM

## 2019-01-17 DIAGNOSIS — I10 Essential (primary) hypertension: Secondary | ICD-10-CM

## 2019-01-17 DIAGNOSIS — R252 Cramp and spasm: Secondary | ICD-10-CM

## 2019-01-17 DIAGNOSIS — J45998 Other asthma: Secondary | ICD-10-CM

## 2019-01-17 MED ORDER — POTASSIUM CHLORIDE CRYS ER 10 MEQ PO TBCR: 10.0000 meq | EXTENDED_RELEASE_TABLET | Freq: Two times a day (BID) | ORAL | 1 refills | Status: DC

## 2019-01-17 MED ORDER — ALBUTEROL SULFATE HFA 108 (90 BASE) MCG/ACT IN AERS: 2.0000 | INHALATION_SPRAY | RESPIRATORY_TRACT | 0 refills | Status: DC | PRN

## 2019-01-17 MED ORDER — ETODOLAC 400 MG PO TABS: ORAL_TABLET | ORAL | 1 refills | Status: DC

## 2019-01-17 MED ORDER — FLUTICASONE FUROATE-VILANTEROL 200-25 MCG/INH IN AEPB: 1.0000 | INHALATION_SPRAY | Freq: Every day | RESPIRATORY_TRACT | 3 refills | Status: DC

## 2019-01-17 MED ORDER — DILTIAZEM HCL ER COATED BEADS 300 MG PO CP24: 300.0000 mg | ORAL_CAPSULE | Freq: Every day | ORAL | 1 refills | Status: DC

## 2019-01-17 MED ORDER — PANTOPRAZOLE SODIUM 40 MG PO TBEC: DELAYED_RELEASE_TABLET | ORAL | 1 refills | Status: DC

## 2019-01-17 MED ORDER — PRAVASTATIN SODIUM 80 MG PO TABS: ORAL_TABLET | ORAL | 1 refills | Status: DC

## 2019-01-17 NOTE — Progress Notes (Signed)
Subjective:    Patient ID: Melissa Barajas, female    DOB: [birth year], 78 y.o.   MRN: [id number]  Hypertension  This is a chronic problem. Associated symptoms include headaches. (Dizzy a few times but that has gone. Has swelling in legs for a couple of weeks. Both legs. Legs are also cramping more. Mostly at night when pt settles down. Pt has to get up and walk or stretch to help with cramp. Nausea every now and then) There are no compliance problems.   pt states that yesterday she did get a fluttering feeling in her heart.  She denies current chest tightness pressure pain or shortness of breath  She does relate that she has been having muscle cramping in her arms and legs wonders if her potassium and calcium are down  She states he takes all of her medicines on a regular basis medications were reviewed with the patient    Pt states she is on Breo Inhaler and would like that through mail order.   Virtual Visit via Video Note  I connected with Melissa Barajas on 01/17/19 at  1:10 PM EDT by a video enabled telemedicine application and verified that I am speaking with the correct person using two identifiers.  Location: Patient: home Provider: office   I discussed the limitations of evaluation and management by telemedicine and the availability of in person appointments. The patient expressed understanding and agreed to proceed.  History of Present Illness:    Observations/Objective:   Assessment and Plan:   Follow Up Instructions:    I discussed the assessment and treatment plan with the patient. The patient was provided an opportunity to ask questions and all were answered. The patient agreed with the plan and demonstrated an understanding of the instructions.   The patient was advised to call back or seek an in-person evaluation if the symptoms worsen or if the condition fails to improve as anticipated.  I provided 25 minutes of non-face-to-face time during this encounter.    [name], LPN   Review of Systems  Neurological: Positive for headaches.       Objective:   Physical Exam   25 minutes was spent with patient today discussing healthcare issues which they came.  More than 50% of this visit-total duration of visit-was spent in counseling and coordination of care.  Please see diagnosis regarding the focus of this coordination and care      Assessment & Plan:  HTN- Patient was seen today as part of a visit regarding hypertension. The importance of healthy diet and regular physical activity was discussed. The importance of compliance with medications discussed.  Ideal goal is to keep blood pressure low elevated levels certainly below 665/99 when possible.  The patient was counseled that keeping blood pressure under control lessen his risk of complications.  The importance of regular follow-ups was discussed with the patient.  Low-salt diet such as DASH recommended.  Regular physical activity was recommended as well.  Patient was advised to keep regular follow-ups.  The patient was seen today as part of an evaluation regarding hyperlipidemia.  Recent lab work has been reviewed with the patient as well as the goals for good cholesterol care.  In addition to this medications have been discussed the importance of compliance with diet and medications discussed as well.  Finally the patient is aware that poor control of cholesterol, noncompliance can dramatically increase the risk of complications. The patient will keep regular office visits and  the patient does agreed to periodic lab work.  Muscle cramping we will check her potassium and thyroid function  Reflux does well with her medications  Underlying asthma will send in refills per patient request  Patient to do in person follow-up office visit within 4 to 5 months

## 2019-01-20 ENCOUNTER — Telehealth: Payer: Self-pay | Admitting: Family Medicine

## 2019-01-20 NOTE — Telephone Encounter (Signed)
May use pro-air 2 puffs every 4 as needed 96-month supply with 3 refills

## 2019-01-20 NOTE — Telephone Encounter (Signed)
Fax from [place] stating that Ventolin inhaler is not covered under pt insurance. Alternatives are Proair HFA 90 mcg inhaler or Proair Respiclick. Or a PA could be completed. Please advise. Thank you.

## 2019-01-21 ENCOUNTER — Telehealth: Payer: Self-pay | Admitting: *Deleted

## 2019-01-21 MED ORDER — ALBUTEROL SULFATE HFA 108 (90 BASE) MCG/ACT IN AERS: 2.0000 | INHALATION_SPRAY | RESPIRATORY_TRACT | 0 refills | Status: DC | PRN

## 2019-01-21 NOTE — Addendum Note (Signed)
Addended by: Dairl Ponder on: 01/21/2019 09:18 AM   Modules accepted: Orders

## 2019-01-21 NOTE — Telephone Encounter (Signed)
Prescription sent electronically to pharmacy. 

## 2019-01-29 NOTE — Telephone Encounter (Signed)
error 

## 2019-02-18 ENCOUNTER — Other Ambulatory Visit: Payer: Self-pay | Admitting: Pulmonary Disease

## 2019-02-27 ENCOUNTER — Ambulatory Visit: Payer: Medicare Other | Admitting: Pulmonary Disease

## 2019-03-03 DIAGNOSIS — R252 Cramp and spasm: Secondary | ICD-10-CM | POA: Diagnosis not present

## 2019-03-03 DIAGNOSIS — J45998 Other asthma: Secondary | ICD-10-CM | POA: Diagnosis not present

## 2019-03-03 DIAGNOSIS — K219 Gastro-esophageal reflux disease without esophagitis: Secondary | ICD-10-CM | POA: Diagnosis not present

## 2019-03-03 DIAGNOSIS — E7849 Other hyperlipidemia: Secondary | ICD-10-CM | POA: Diagnosis not present

## 2019-03-03 DIAGNOSIS — I10 Essential (primary) hypertension: Secondary | ICD-10-CM | POA: Diagnosis not present

## 2019-03-03 DIAGNOSIS — R05 Cough: Secondary | ICD-10-CM | POA: Diagnosis not present

## 2019-03-04 LAB — HEPATIC FUNCTION PANEL
ALT: 15 IU/L (ref 0–32)
AST: 17 IU/L (ref 0–40)
Albumin: 4.3 g/dL (ref 3.7–4.7)
Alkaline Phosphatase: 98 IU/L (ref 39–117)
Bilirubin Total: 0.3 mg/dL (ref 0.0–1.2)
Bilirubin, Direct: 0.14 mg/dL (ref 0.00–0.40)
Total Protein: 6.7 g/dL (ref 6.0–8.5)

## 2019-03-04 LAB — BASIC METABOLIC PANEL
BUN/Creatinine Ratio: 18 (ref 12–28)
BUN: 15 mg/dL (ref 8–27)
CO2: 20 mmol/L (ref 20–29)
Calcium: 10 mg/dL (ref 8.7–10.3)
Chloride: 102 mmol/L (ref 96–106)
Creatinine, Ser: 0.83 mg/dL (ref 0.57–1.00)
GFR calc Af Amer: 80 mL/min/{1.73_m2} (ref >59)
GFR calc non Af Amer: 69 mL/min/{1.73_m2} (ref >59)
Glucose: 95 mg/dL (ref 65–99)
Potassium: 4.3 mmol/L (ref 3.5–5.2)
Sodium: 137 mmol/L (ref 134–144)

## 2019-03-04 LAB — TSH: TSH: 1.56 u[IU]/mL (ref 0.450–4.500)

## 2019-03-04 LAB — MAGNESIUM: Magnesium: 1.8 mg/dL (ref 1.6–2.3)

## 2019-03-05 ENCOUNTER — Encounter: Payer: Self-pay | Admitting: Family Medicine

## 2019-03-07 DIAGNOSIS — Z1231 Encounter for screening mammogram for malignant neoplasm of breast: Secondary | ICD-10-CM | POA: Diagnosis not present

## 2019-03-17 ENCOUNTER — Telehealth: Payer: Self-pay | Admitting: Family Medicine

## 2019-03-17 NOTE — Telephone Encounter (Signed)
Pt is checking on her lab orders. She never received the letter in the mail regarding them.

## 2019-03-17 NOTE — Telephone Encounter (Signed)
Called pt and read letter to her and reprinted and put in the mail for her.

## 2019-03-21 DIAGNOSIS — H3562 Retinal hemorrhage, left eye: Secondary | ICD-10-CM | POA: Diagnosis not present

## 2019-03-24 ENCOUNTER — Telehealth: Payer: Self-pay | Admitting: Family Medicine

## 2019-03-24 MED ORDER — TIZANIDINE HCL 4 MG PO TABS: 4.0000 mg | ORAL_TABLET | Freq: Three times a day (TID) | ORAL | 0 refills | Status: DC | PRN

## 2019-03-24 NOTE — Telephone Encounter (Signed)
Prescription sent electronically to pharmacy. Patient notified. 

## 2019-03-24 NOTE — Telephone Encounter (Signed)
Pt has crick in neck and would like muscle relaxer's called in or a neck brace. She feels ok in the mornings but as the day goes on it gets worse.   [id number] - [place], [address]

## 2019-03-24 NOTE — Telephone Encounter (Signed)
zanaflex 4 mg 24 one p o tid prn for neck discomfort

## 2019-04-28 ENCOUNTER — Other Ambulatory Visit: Payer: Self-pay

## 2019-04-28 ENCOUNTER — Encounter: Payer: Medicare Other | Admitting: Family Medicine

## 2019-04-28 NOTE — Progress Notes (Signed)
   Subjective:    Patient ID: Melissa Barajas, female    DOB: 1940-09-17, 78 y.o.   MRN: [id number]  Hypertension      Review of Systems     Objective:   Physical Exam        Assessment & Plan:

## 2019-05-10 NOTE — Progress Notes (Signed)
This encounter was created in error - please disregard.

## 2019-06-14 ENCOUNTER — Emergency Department (HOSPITAL_COMMUNITY)
Admission: EM | Admit: 2019-06-14 | Discharge: 2019-06-14 | Disposition: A | Discharge status: Home / Self Care | Payer: Medicare Other | Attending: Emergency Medicine | Admitting: Emergency Medicine

## 2019-06-14 ENCOUNTER — Other Ambulatory Visit: Payer: Self-pay

## 2019-06-14 ENCOUNTER — Emergency Department (HOSPITAL_COMMUNITY): Payer: Medicare Other

## 2019-06-14 ENCOUNTER — Encounter (HOSPITAL_COMMUNITY): Payer: Self-pay | Admitting: Emergency Medicine

## 2019-06-14 DIAGNOSIS — M25512 Pain in left shoulder: Secondary | ICD-10-CM | POA: Diagnosis present

## 2019-06-14 DIAGNOSIS — Z87891 Personal history of nicotine dependence: Secondary | ICD-10-CM | POA: Insufficient documentation

## 2019-06-14 DIAGNOSIS — M62838 Other muscle spasm: Secondary | ICD-10-CM | POA: Insufficient documentation

## 2019-06-14 DIAGNOSIS — Z79899 Other long term (current) drug therapy: Secondary | ICD-10-CM | POA: Diagnosis not present

## 2019-06-14 DIAGNOSIS — M25511 Pain in right shoulder: Secondary | ICD-10-CM | POA: Diagnosis not present

## 2019-06-14 DIAGNOSIS — I10 Essential (primary) hypertension: Secondary | ICD-10-CM | POA: Insufficient documentation

## 2019-06-14 MED ORDER — METHOCARBAMOL 500 MG PO TABS
500.0000 mg | ORAL_TABLET | Freq: Once | ORAL | Status: AC
  Administered 2019-06-14: 20:00:00 500 mg via ORAL
  Filled 2019-06-14: qty 1

## 2019-06-14 MED ORDER — KETOROLAC TROMETHAMINE 30 MG/ML IJ SOLN
30.0000 mg | Freq: Once | INTRAMUSCULAR | Status: AC
  Administered 2019-06-14: 30 mg via INTRAMUSCULAR
  Filled 2019-06-14: qty 1

## 2019-06-14 MED ORDER — METHOCARBAMOL 500 MG PO TABS: 500.0000 mg | ORAL_TABLET | Freq: Three times a day (TID) | ORAL | 0 refills | Status: DC

## 2019-06-14 NOTE — Discharge Instructions (Addendum)
Alternate ice and heat to your shoulder as needed.  Follow-up with your primary doctor for recheck

## 2019-06-14 NOTE — ED Provider Notes (Signed)
[facility] EMERGENCY DEPARTMENT Provider Note   CSN: [id number] Arrival date & time: 06/14/19  1839     History   Chief Complaint Chief Complaint  Patient presents with  . Shoulder Pain    HPI Melissa Barajas is a 78 y.o. female.     HPI   Melissa Barajas is a 78 y.o. female who presents to the Emergency Department complaining of waxing and waning pain to her left shoulder blade area that radiates to her right.  Symptoms initially began 2 weeks ago, but spontaneously resolved.  Tonight, patient reports recurrent symptoms that again began on the left shoulder blade area and is now on the entire right shoulder area.  She describes the pain as sharp at times lasting for several seconds and then subsiding.  She denies known injury, but states she has been carrying heavy baskets of laundry.  She denies chest pain, shortness of breath, pain radiating to her neck or jaw, numbness or weakness of her upper extremities.  She has not tried any therapies prior to arrival for symptom relief.  No headache, dizziness or diaphoresis.      Past Medical History:  Diagnosis Date  . Arthritis   . Atrial fibrillation ([place])   . Collagen vascular disease ([place])   . DIVERTICULOSIS OF COLON 08/25/2010   Qualifier: Diagnosis of  By: Hoy Morn    . GERD (gastroesophageal reflux disease)   . HEMORRHOIDS, INTERNAL 08/25/2010   Qualifier: Diagnosis of  By: Hoy Morn    . Hypercholesteremia   . Hypertension   . Lipoma of arm    Left    Patient Active Problem List   Diagnosis Date Noted  . Rectal bleeding   . Hypercalcemia 02/01/2018  . BRBPR (bright red blood per rectum) 11/29/2017  . Cognitive dysfunction 10/30/2017  . Dysphagia, pharyngeal 05/24/2017  . Hyperglycemia 10/12/2014  . Ganglion cyst 09/26/2013  . 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 Urge incontinence 03/25/2013  . Functional constipation 11/20/2012  . Hyperlipidemia 08/25/2010  . Essential  hypertension 08/25/2010  . GERD 08/25/2010    Past Surgical History:  Procedure Laterality Date  . ABDOMINAL HYSTERECTOMY     Partial  . COLONOSCOPY  2009   SLF: 1. screening colonoscopy in 10 years 2. She should follow a high fiber diet She has been given a hand out on high fiber diverticulosois and hemorrhoids  . COLONOSCOPY N/A 02/07/2018   Procedure: COLONOSCOPY;  Surgeon: Danie Binder, MD;  Location: [facility];  Service: Endoscopy;  Laterality: N/A;  1:00pm  . CYSTECTOMY     Left breast  . ESOPHAGOGASTRODUODENOSCOPY     [id number] appearing Schatzki's ring, possible cervical esophageal web, both dilated with passage of a Maloney dilator/small hiatal hernia  . ESOPHAGOGASTRODUODENOSCOPY N/A 10/25/2015   Procedure: ESOPHAGOGASTRODUODENOSCOPY (EGD);  Surgeon: Danie Binder, MD;  Location: [facility];  Service: Endoscopy;  Laterality: N/A;  1215pm  . POLYPECTOMY  02/07/2018   Procedure: POLYPECTOMY;  Surgeon: Danie Binder, MD;  Location: [facility];  Service: Endoscopy;;  ascending colon, descending, sigmoid  . SAVORY DILATION N/A 10/25/2015   Procedure: SAVORY DILATION;  Surgeon: Danie Binder, MD;  Location: [facility];  Service: Endoscopy;  Laterality: N/A;  . THYROIDECTOMY    . Tumor   05/2010   Left breast under nipple  . VESICOVAGINAL FISTULA CLOSURE W/ TAH     fibroid tumors     OB History    Gravida  6   Para      Term      Preterm      AB  2   Living        SAB      TAB      Ectopic      Multiple      Live Births               Home Medications    Prior to Admission medications   Medication Sig Start Date End Date Taking? Authorizing Provider  acetaminophen (TYLENOL) 500 MG tablet Take 1,000 mg by mouth 2 (two) times daily as needed for moderate pain or headache.    [provider]  albuterol (VENTOLIN HFA) 108 (90 Base) MCG/ACT inhaler Inhale 2 puffs into the lungs every 4 (four) hours as needed for wheezing or  shortness of breath. 01/21/19   Kathyrn Drown, MD  BREO ELLIPTA 200-25 MCG/INH AEPB INHALE 1 PUFF INTO THE LUNGS DAILY 02/18/19   Mannam, Praveen, MD  diltiazem (CARDIZEM CD) 300 MG 24 hr capsule Take 1 capsule (300 mg total) by mouth daily. 01/17/19   Kathyrn Drown, MD  etodolac (LODINE) 400 MG tablet TK 1 T PO BID 01/17/19   Luking, Elayne Snare, MD  methocarbamol (ROBAXIN) 500 MG tablet Take 1 tablet (500 mg total) by mouth every 8 (eight) hours as needed for muscle spasms. 09/05/18   Rolland Porter, MD  OVER THE COUNTER MEDICATION Take 4 tablets by mouth daily. Focus factor     [provider]  pantoprazole (PROTONIX) 40 MG tablet TAKE 1 TABLET(40 MG) BY MOUTH TWICE DAILY 01/17/19   Luking, Elayne Snare, MD  Polyethyl Glycol-Propyl Glycol (SYSTANE OP) Place 2 drops into both eyes daily.    [provider]  potassium chloride (K-DUR) 10 MEQ tablet Take 1 tablet (10 mEq total) by mouth 2 (two) times daily. 01/17/19   Kathyrn Drown, MD  pravastatin (PRAVACHOL) 80 MG tablet TAKE 1 TABLET BY MOUTH  DAILY 01/17/19   Kathyrn Drown, MD  Probiotic Product (PROBIOTIC DAILY PO) Take 1 tablet by mouth daily.     [provider]  tiZANidine (ZANAFLEX) 4 MG tablet Take 1 tablet (4 mg total) by mouth 3 (three) times daily as needed for muscle spasms. 03/24/19   [name], MD    Family History Family History  Problem Relation Age of Onset  . Cancer Brother        Prostate    Social History Social History   Tobacco Use  . Smoking status: Former Smoker    Packs/day: 1.00    Years: 15.00    Pack years: 15.00    Types: Cigarettes    Start date: 09/12/1983    Quit date: 09/11/1993    Years since quitting: 25.7  . Smokeless tobacco: Never Used  Substance Use Topics  . Alcohol use: No    Alcohol/week: 0.0 standard drinks  . Drug use: No     Allergies   Aricept [donepezil hcl], Hydrocodone, Lisinopril, Naprosyn [naproxen], and Vioxx [rofecoxib]   Review of Systems Review of  Systems  Constitutional: Negative for chills and fever.  Respiratory: Negative for cough, chest tightness and shortness of breath.   Cardiovascular: Negative for chest pain.  Gastrointestinal: Negative for abdominal pain, nausea and vomiting.  Genitourinary: Negative for difficulty urinating, dysuria, flank pain and urgency.  Musculoskeletal: Positive for arthralgias and joint swelling.  Skin: Negative for color change, rash and wound.  Neurological: Negative for dizziness, weakness, numbness and headaches.     Physical Exam Updated Vital Signs BP (!) 182/77 (BP Location: Left Arm)   Pulse 79   Temp 98 F (36.7 C) (Oral)   Resp 18   Ht 5' 6.5" (1.689 m)   Wt 83.9 kg   SpO2 97%   BMI 29.41 kg/m   Physical Exam Vitals signs and nursing note reviewed.  Constitutional:      Appearance: Normal appearance.     Comments: Uncomfortable appearing.  HENT:     Head: Atraumatic.  Neck:     Musculoskeletal: Normal range of motion. No neck rigidity or muscular tenderness.  Cardiovascular:     Rate and Rhythm: Normal rate and regular rhythm.     Pulses: Normal pulses.  Pulmonary:     Effort: Pulmonary effort is normal.     Breath sounds: Normal breath sounds.  Chest:     Chest wall: No tenderness.  Abdominal:     General: There is no distension.     Palpations: Abdomen is soft.     Tenderness: There is no abdominal tenderness. There is no right CVA tenderness or left CVA tenderness.  Musculoskeletal:        General: Tenderness present. No swelling.     Comments: Diffuse tenderness palpation of the right rhomboid muscles.  No tenderness of the C-spine or T-spine.  No bony step-offs.  Patient has full range of motion of the right shoulder, 5 out of 5 grip strength bilaterally.  Skin:    General: Skin is warm.     Capillary Refill: Capillary refill takes less than 2 seconds.     Findings: No rash.  Neurological:     General: No focal deficit present.     Mental Status: She is  alert.     Sensory: Sensation is intact. No sensory deficit.     Motor: Motor function is intact. No weakness.      ED Treatments / Results  Labs (all labs ordered are listed, but only abnormal results are displayed) Labs Reviewed - No data to display  EKG EKG Interpretation  Date/Time:  Saturday June 14 2019 19:23:21 EDT Ventricular Rate:  84 PR Interval:  172 QRS Duration: 118 QT Interval:  396 QTC Calculation: 467 R Axis:   -53 Text Interpretation:  Normal sinus rhythm Pulmonary disease pattern Right bundle branch block Left anterior fascicular block  Bifascicular block Moderate voltage criteria for LVH, may be normal variant Cannot rule out Septal infarct , age undetermined Abnormal ECG since last tracing no significant change Confirmed by Daleen Bo [phone]) on 06/14/2019 8:01:45 PM   Radiology Dg Shoulder Right  Result Date: 06/14/2019 CLINICAL DATA:  Pain EXAM: RIGHT SHOULDER - 2+ VIEW COMPARISON:  None. FINDINGS: There is no evidence of fracture or dislocation. Mild glenohumeral joint and AC joint arthrosis is seen. Surgical clips in the neck. Soft tissues are unremarkable. IMPRESSION: No acute osseous injury. Electronically Signed   By: Prudencio Pair M.D.   On: 06/14/2019 21:16    Procedures Procedures (including critical care time)  Medications Ordered in ED Medications  ketorolac (TORADOL) 30 MG/ML injection 30 mg (30 mg Intramuscular Given 06/14/19 2027)  methocarbamol (ROBAXIN) tablet 500 mg (500 mg Oral Given 06/14/19 2027)     Initial Impression / Assessment and Plan / ED Course  I have reviewed the triage vital signs and the nursing notes.  Pertinent labs & imaging results that were available during my care of the  patient were reviewed by me and considered in my medical decision making (see chart for details).    Patient with reproducible pain along the right scapular border.  No rashes present.  No focal neuro deficits.  No motor weakness.  Pain is  felt to be musculoskeletal.  Doubt cardiac process.  Vital signs reviewed.  X-ray reassuring.    Pt also seen by Dr. Eulis Foster   On recheck, pain has completely resolved and she states she is ready for d/c home.  She agrees to treatment plan with muscle relaxer, she takes it NSAIDs at home, agrees to alternating ice and heat to the affected area.  Return precautions discussed.  Final Clinical Impressions(s) / ED Diagnoses   Final diagnoses:  Muscle spasm of shoulder region    ED Discharge Orders    None       Kem Parkinson, PA-C 06/15/19 0011    Daleen Bo, MD 06/15/19 1011

## 2019-06-14 NOTE — ED Triage Notes (Signed)
Patient states she is having pain in her shoulder that is radiating to her right side. Patient denies any injury.Patient states it has been ongoing but has gotten worse today.

## 2019-06-14 NOTE — ED Provider Notes (Signed)
  Face-to-face evaluation   History: She complains of discomfort in both shoulders.  No known trauma.  Denies neck pain.  Physical exam: Alert elderly female.  Examined after treatment, and she states her symptoms have resolved.  She demonstrates normal range of motion of the shoulders bilaterally.  Medical screening examination/treatment/procedure(s) were conducted as a shared visit with non-physician practitioner(s) and myself.  I personally evaluated the patient during the encounter    Daleen Bo, MD 06/15/19 1011

## 2019-06-16 ENCOUNTER — Other Ambulatory Visit: Payer: Self-pay | Admitting: Family Medicine

## 2019-06-16 DIAGNOSIS — R059 Cough, unspecified: Secondary | ICD-10-CM

## 2019-06-16 DIAGNOSIS — Z23 Encounter for immunization: Secondary | ICD-10-CM | POA: Diagnosis not present

## 2019-06-16 DIAGNOSIS — R05 Cough: Secondary | ICD-10-CM

## 2019-08-06 ENCOUNTER — Other Ambulatory Visit: Payer: Self-pay

## 2019-08-13 ENCOUNTER — Other Ambulatory Visit: Payer: Self-pay | Admitting: Family Medicine

## 2019-08-13 NOTE — Telephone Encounter (Signed)
Go ahead and do 1 month refills for 90 days which ever Schedule her for a virtual visit in December please

## 2019-08-13 NOTE — Telephone Encounter (Signed)
May go ahead and have refills for 6 months But please go ahead and connect with patient to do a virtual visit in December

## 2019-08-14 NOTE — Telephone Encounter (Signed)
Pt is scheduled for virtual 12/17

## 2019-08-28 ENCOUNTER — Ambulatory Visit (INDEPENDENT_AMBULATORY_CARE_PROVIDER_SITE_OTHER): Payer: Medicare Other | Admitting: Family Medicine

## 2019-08-28 ENCOUNTER — Encounter: Payer: Self-pay | Admitting: Family Medicine

## 2019-08-28 DIAGNOSIS — I10 Essential (primary) hypertension: Secondary | ICD-10-CM | POA: Diagnosis not present

## 2019-08-28 DIAGNOSIS — R1011 Right upper quadrant pain: Secondary | ICD-10-CM | POA: Diagnosis not present

## 2019-08-28 DIAGNOSIS — R21 Rash and other nonspecific skin eruption: Secondary | ICD-10-CM

## 2019-08-28 NOTE — Progress Notes (Signed)
   Subjective:    Patient ID: Melissa Barajas, female    DOB: [birth year], 78 y.o.   MRN: [id number]  Hypertension This is a chronic problem. Pertinent negatives include no chest pain or shortness of breath. (Every once in a while pt will wake up with a headache. Pt has some splotchy areas on leg and thigh. Pt has been having some rib pain on the right side; cramping feeling; that comes and goes ) There are no compliance problems.   Patient also relates having aches and pains in her right upper quadrant no nausea or vomiting with it.  Denies high fever chills sweats denies wheezing difficulty breathing states energy level overall okay  The upper abdominal pains come and go and last 2030 minutes at a time more so when she is sitting bowel movements are okay no blood in them Virtual Visit via Telephone Note  I connected with Melissa Barajas on 08/28/19 at  9:00 AM EST by telephone and verified that I am speaking with the correct person using two identifiers.  Location: Patient: home Provider: office   I discussed the limitations, risks, security and privacy concerns of performing an evaluation and management service by telephone and the availability of in person appointments. I also discussed with the patient that there may be a patient responsible charge related to this service. The patient expressed understanding and agreed to proceed.   History of Present Illness:    Observations/Objective:   Assessment and Plan:   Follow Up Instructions:    I discussed the assessment and treatment plan with the patient. The patient was provided an opportunity to ask questions and all were answered. The patient agreed with the plan and demonstrated an understanding of the instructions.   The patient was advised to call back or seek an in-person evaluation if the symptoms worsen or if the condition fails to improve as anticipated.  I provided 16 minutes of non-face-to-face time during this encounter.    [name], LPN    Review of Systems  Constitutional: Negative for activity change, appetite change and fatigue.  HENT: Negative for congestion and rhinorrhea.   Respiratory: Negative for cough and shortness of breath.   Cardiovascular: Negative for chest pain and leg swelling.  Gastrointestinal: Positive for abdominal pain. Negative for constipation and diarrhea.  Endocrine: Negative for polydipsia and polyphagia.  Skin: Positive for rash. Negative for color change.  Neurological: Negative for dizziness and weakness.  Psychiatric/Behavioral: Negative for behavioral problems and confusion.      Objective:   Physical Exam  She also relates a rash on her lower legs she does relate a rash on her lower legs Today's visit was via telephone Physical exam was not possible for this visit      Assessment & Plan:  Blood pressure reportedly good control continue current medications  Rash on lower legs she will follow-up for in person visit  Right upper quadrant in person visit coming up for further evaluation and lab work patient will come the 28th at 8:30 AM

## 2019-09-08 ENCOUNTER — Encounter: Payer: Self-pay | Admitting: Family Medicine

## 2019-09-08 ENCOUNTER — Ambulatory Visit (INDEPENDENT_AMBULATORY_CARE_PROVIDER_SITE_OTHER): Payer: Medicare Other | Admitting: Family Medicine

## 2019-09-08 ENCOUNTER — Other Ambulatory Visit: Payer: Self-pay | Admitting: Family Medicine

## 2019-09-08 ENCOUNTER — Other Ambulatory Visit: Payer: Self-pay

## 2019-09-08 VITALS — BP 132/80 | Temp 97.5°F | Ht 66.0 in | Wt 201.8 lb

## 2019-09-08 DIAGNOSIS — E7849 Other hyperlipidemia: Secondary | ICD-10-CM

## 2019-09-08 DIAGNOSIS — I10 Essential (primary) hypertension: Secondary | ICD-10-CM

## 2019-09-08 DIAGNOSIS — R252 Cramp and spasm: Secondary | ICD-10-CM | POA: Diagnosis not present

## 2019-09-08 DIAGNOSIS — R1010 Upper abdominal pain, unspecified: Secondary | ICD-10-CM

## 2019-09-08 MED ORDER — SUCRALFATE 1 G PO TABS: 1.0000 g | ORAL_TABLET | Freq: Four times a day (QID) | ORAL | 0 refills | Status: DC

## 2019-09-08 NOTE — Progress Notes (Signed)
Subjective:    Patient ID: Melissa Barajas, female    DOB: 03-31-1941, 78 y.o.   MRN: [id number]  Abdominal Pain This is a recurrent problem. Episode onset: one year. Episode frequency: off and on. last for about 15 minutes. Pertinent negatives include no nausea or vomiting. Exacerbated by: eating tomatoes. She has tried nothing for the symptoms.  Patient does relate eating certain things because it hurt more relates more in the upper epigastric region does not radiate into the chest no chest tightness or pressure with it no sweats or chills no fevers.  Energy level overall fairly good.  Patient has gained weight because of pandemic has not been exercising as much The pain does not wake her up at night.  Does not radiate to her back.  No wheezing difficulty breathing nausea or vomiting associated.  Cramping in both legs. Started a couple of months ago.  Patient relates an uncomfortable feeling in her legs in the evening time sometimes has to move her legs to get him feeling better at times it feels like a cramp other times abnormal in the muscles of the lower legs.  No numbness or tingling.  No weakness.  Review of Systems  Constitutional: Negative for activity change and appetite change.  HENT: Negative for congestion and rhinorrhea.   Respiratory: Negative for cough and shortness of breath.   Cardiovascular: Negative for chest pain and leg swelling.  Gastrointestinal: Positive for abdominal pain. Negative for blood in stool, nausea and vomiting.  Skin: Negative for color change.  Neurological: Negative for dizziness and weakness.  Psychiatric/Behavioral: Negative for agitation and confusion.       Objective:   Physical Exam Vitals reviewed.  Constitutional:      General: She is not in acute distress. HENT:     Head: Normocephalic and atraumatic.  Eyes:     General:        Right eye: No discharge.        Left eye: No discharge.  Neck:     Trachea: No tracheal deviation.    Cardiovascular:     Rate and Rhythm: Normal rate and regular rhythm.     Heart sounds: Normal heart sounds. No murmur.  Pulmonary:     Effort: Pulmonary effort is normal. No respiratory distress.     Breath sounds: Normal breath sounds.  Abdominal:     General: Abdomen is flat. There is no distension.     Palpations: Abdomen is soft.     Tenderness: There is abdominal tenderness in the epigastric area. There is no guarding or rebound.     Hernia: No hernia is present.  Lymphadenopathy:     Cervical: No cervical adenopathy.  Skin:    General: Skin is warm and dry.  Neurological:     Mental Status: She is alert.     Coordination: Coordination normal.  Psychiatric:        Behavior: Behavior normal.   The rash on her knees is now fading.  Lower legs no swelling.  Negative straight leg raise.  Strength is good in the lower legs.        Assessment & Plan:  Blood pressure good control continue current measures.  History of epigastric discomfort need to go ahead with PPI ongoing twice daily and Carafate 4 times daily do a phone follow-up in 2 to 3 weeks and check lab work.  Based upon lab work patient may or may not need further intervention  We will look at  CBC make sure there is not significant anemia.  We will also look at liver function and pancreatic function  History of muscle cramps showed her some stretches she can do for her lower leg later in the evenings to try to help out and check her metabolic 7 and magnesium  Also check H. pylori antibody await the results of these

## 2019-09-10 LAB — CBC WITH DIFFERENTIAL/PLATELET
Basophils Absolute: 0.1 10*3/uL (ref 0.0–0.2)
Basos: 1 %
EOS (ABSOLUTE): 0.3 10*3/uL (ref 0.0–0.4)
Eos: 4 %
Hematocrit: 42.4 % (ref 34.0–46.6)
Hemoglobin: 14.1 g/dL (ref 11.1–15.9)
Immature Grans (Abs): 0.1 10*3/uL (ref 0.0–0.1)
Immature Granulocytes: 1 %
Lymphocytes Absolute: 2 10*3/uL (ref 0.7–3.1)
Lymphs: 23 %
MCH: 31.4 pg (ref 26.6–33.0)
MCHC: 33.3 g/dL (ref 31.5–35.7)
MCV: 94 fL (ref 79–97)
Monocytes Absolute: 0.9 10*3/uL (ref 0.1–0.9)
Monocytes: 10 %
Neutrophils Absolute: 5.5 10*3/uL (ref 1.4–7.0)
Neutrophils: 61 %
Platelets: 218 10*3/uL (ref 150–450)
RBC: 4.49 x10E6/uL (ref 3.77–5.28)
RDW: 13.8 % (ref 11.7–15.4)
WBC: 8.9 10*3/uL (ref 3.4–10.8)

## 2019-09-10 LAB — HEPATIC FUNCTION PANEL
ALT: 15 IU/L (ref 0–32)
AST: 22 IU/L (ref 0–40)
Albumin: 4.5 g/dL (ref 3.7–4.7)
Alkaline Phosphatase: 116 IU/L (ref 39–117)
Bilirubin Total: 0.3 mg/dL (ref 0.0–1.2)
Bilirubin, Direct: 0.13 mg/dL (ref 0.00–0.40)
Total Protein: 7.3 g/dL (ref 6.0–8.5)

## 2019-09-10 LAB — H PYLORI, IGM, IGG, IGA AB
H pylori, IgM Abs: <9 units (ref 0.0–8.9)
H. pylori, IgA Abs: 12.9 units — ABNORMAL HIGH (ref 0.0–8.9)
H. pylori, IgG AbS: 2.74 Index Value — ABNORMAL HIGH (ref 0.00–0.79)

## 2019-09-10 LAB — BASIC METABOLIC PANEL
BUN/Creatinine Ratio: 13 (ref 12–28)
BUN: 11 mg/dL (ref 8–27)
CO2: 25 mmol/L (ref 20–29)
Calcium: 10.2 mg/dL (ref 8.7–10.3)
Chloride: 102 mmol/L (ref 96–106)
Creatinine, Ser: 0.88 mg/dL (ref 0.57–1.00)
GFR calc Af Amer: 74 mL/min/{1.73_m2} (ref >59)
GFR calc non Af Amer: 64 mL/min/{1.73_m2} (ref >59)
Glucose: 104 mg/dL — ABNORMAL HIGH (ref 65–99)
Potassium: 4.8 mmol/L (ref 3.5–5.2)
Sodium: 144 mmol/L (ref 134–144)

## 2019-09-10 LAB — LIPASE: Lipase: 42 U/L (ref 14–85)

## 2019-09-10 LAB — MAGNESIUM: Magnesium: 2 mg/dL (ref 1.6–2.3)

## 2019-09-10 MED ORDER — AMOXICILLIN 500 MG PO CAPS: ORAL_CAPSULE | ORAL | 0 refills | Status: DC

## 2019-09-10 MED ORDER — CLARITHROMYCIN 500 MG PO TABS: ORAL_TABLET | ORAL | 0 refills | Status: DC

## 2019-09-10 NOTE — Addendum Note (Signed)
Addended by: [name] on: 09/10/2019 04:24 PM   Modules accepted: Orders

## 2019-09-22 ENCOUNTER — Ambulatory Visit (INDEPENDENT_AMBULATORY_CARE_PROVIDER_SITE_OTHER): Payer: Medicare Other | Admitting: Family Medicine

## 2019-09-22 ENCOUNTER — Other Ambulatory Visit: Payer: Self-pay

## 2019-09-22 DIAGNOSIS — R252 Cramp and spasm: Secondary | ICD-10-CM

## 2019-09-22 DIAGNOSIS — R1010 Upper abdominal pain, unspecified: Secondary | ICD-10-CM

## 2019-09-22 NOTE — Progress Notes (Signed)
   Subjective:    Patient ID: Melissa Barajas, female    DOB: [birth year], 79 y.o.   MRN: [id number]  HPI Pt is having 2 week follow from 09/08/2019. Pt is not have stomach pain as before. Pt is having leg cramps, legs are dark in color. Pt states the medication she was placed on, Carafate, has helped. Pt is on Amoxicillin and Biaxin also.  Patient had gastritis appearing female starting to look better.  Having toleration for the antibiotics to treat that H. pylori  She also relates intermittent abdominal sharp pains but nothing severe much better than what she was She also gets cramping in the legs intermittently does try to do some stretching Recent lab work was good for electrolytes Virtual Visit via Telephone Note  I connected with Melissa Barajas on 09/22/19 at  9:30 AM EST by telephone and verified that I am speaking with the correct person using two identifiers.  Location: Patient: home Provider: office   I discussed the limitations, risks, security and privacy concerns of performing an evaluation and management service by telephone and the availability of in person appointments. I also discussed with the patient that there may be a patient responsible charge related to this service. The patient expressed understanding and agreed to proceed.   History of Present Illness:    Observations/Objective:   Assessment and Plan:   Follow Up Instructions:    I discussed the assessment and treatment plan with the patient. The patient was provided an opportunity to ask questions and all were answered. The patient agreed with the plan and demonstrated an understanding of the instructions.   The patient was advised to call back or seek an in-person evaluation if the symptoms worsen or if the condition fails to improve as anticipated.  I provided 16 minutes of non-face-to-face time during this encounter.       Review of Systems  Constitutional: Negative for activity change, appetite  change and fatigue.  HENT: Negative for congestion and rhinorrhea.   Respiratory: Negative for cough and shortness of breath.   Cardiovascular: Negative for chest pain and leg swelling.  Gastrointestinal: Negative for abdominal pain, diarrhea and nausea.  Endocrine: Negative for polydipsia and polyphagia.  Skin: Negative for color change.  Neurological: Negative for dizziness and weakness.  Psychiatric/Behavioral: Negative for behavioral problems and confusion.       Objective:   Physical Exam  Today's visit was via telephone Physical exam was not possible for this visit  Vaccine was discussed for Covid as well as recommendation for the vaccine     Assessment & Plan:  Follow-up in 3 months If not doing better in 3 months we will check thyroid function as well  Cramps-stretches were recommended she may continue her OTC measures  She relates some slight areas of hyperpigmentation of the lower legs but not all over I encourage patient to watch this if it becomes worse we will need to check ACTH and cortisol levels  H. pylori gastritis doing better finish out the medicine

## 2019-10-01 ENCOUNTER — Ambulatory Visit: Payer: Medicare Other | Attending: Internal Medicine

## 2019-10-01 DIAGNOSIS — Z23 Encounter for immunization: Secondary | ICD-10-CM | POA: Insufficient documentation

## 2019-10-01 NOTE — Progress Notes (Signed)
   Covid-19 Vaccination Clinic  Name:  Melissa Barajas    MRN: [id number] DOB: Apr 28, 1941  10/01/2019  [name] was observed post Covid-19 immunization for 15 minutes without incidence. She was provided with Vaccine Information Sheet and instruction to access the V-Safe system.   [name] was instructed to call 911 with any severe reactions post vaccine: Marland Kitchen Difficulty breathing  . Swelling of your face and throat  . A fast heartbeat  . A bad rash all over your body  . Dizziness and weakness    Immunizations Administered    Name Date Dose VIS Date Route   Pfizer COVID-19 Vaccine 10/01/2019  3:48 PM 0.3 mL 08/22/2019 Intramuscular   Manufacturer: [place]   Lot: [id number]   [place]: [id number]

## 2019-10-16 ENCOUNTER — Other Ambulatory Visit: Payer: Self-pay

## 2019-10-16 ENCOUNTER — Encounter: Payer: Self-pay | Admitting: Gastroenterology

## 2019-10-16 ENCOUNTER — Encounter: Payer: Self-pay | Admitting: *Deleted

## 2019-10-16 ENCOUNTER — Ambulatory Visit (INDEPENDENT_AMBULATORY_CARE_PROVIDER_SITE_OTHER): Payer: Medicare Other | Admitting: Gastroenterology

## 2019-10-16 ENCOUNTER — Telehealth: Payer: Self-pay | Admitting: *Deleted

## 2019-10-16 DIAGNOSIS — R1319 Other dysphagia: Secondary | ICD-10-CM

## 2019-10-16 DIAGNOSIS — Z6830 Body mass index (BMI) 30.0-30.9, adult: Secondary | ICD-10-CM

## 2019-10-16 NOTE — Assessment & Plan Note (Signed)
EAT TO LIVE AND THINK OF FOOD AS MEDICINE. 75% OF YOUR PLATE SHOULD BE FRUITS/VEGGIES.  To have more energy, and to lose weight:      1. CONTINUE YOUR WEIGHT LOSS EFFORTS. I RECOMMEND YOU READ AND FOLLOW RECOMMENDATIONS BY DR. MARK HYMAN, "10-DAY DETOX DIET".   2. If you must eat bread, EAT EZEKIEL BREAD. IT IS IN THE FROZEN SECTION OF THE GROCERY STORE.   3. DRINK WATER WITH FRUIT OR CUCUMBER ADDED. YOUR URINE SHOULD BE LIGHT YELLOW. AVOID SODA, GATORADE, ENERGY DRINKS, OR DIET SODA.    4. AVOID HIGH FRUCTOSE CORN SYRUP AND CAFFEINE.    5. DO NOT chew SUGAR FREE GUM OR USE ARTIFICIAL SWEETENERS. IF NEEDED USE STEVIA AS A SWEETENER.   6. DO NOT EAT ENRICHED WHEAT FLOUR, PASTA, RICE, OR CEREAL.   7. ONLY EAT WILD CAUGHT SEAFOOD, GRASS FED BEEF OR CHICKEN, PORK FROM PASTURE RAISE PIGS, OR EGGS FROM PASTURE RAISED CHICKENS.   8. PRACTICE CHAIR YOGA FOR 15-30 MINS 3 OR 4 TIMES A WEEK AND PROGRESS TO HATHA YOGA OVER NEXT 6 MOS.   9. START TAKING A MULTIVITAMIN, VITAMIN B12, AND VITAMIN D3 2000 IU DAILY.  FOR 3 MOS TAKE THESE ADDITIONAL SUPPLEMENTS TO DECREASE CRAVING AND SUPPRESS YOUR APPETITE:    1. CINNAMON 500 MG EVERY AM PRIOR TO FIRST MEAL.   **STABILIZES BLOOD GLUCOSE/REDUCES CRAVINGS**   2. CHROMIUM 400-500 MG WITH MEALS TWICE DAILY.    **FAT BURNER**   3. GREEN TEA EXTRACT ONE DAILY.   **FAT BURNER/SUPPRESSES YOUR APPETITE**   4. ALPHA LIPOIC ACID TWICE DAILY.   **NATURAL ANTI-INFLAMMATORY SUPPLEMENT THAT IS AN ALTERNATIVE TO IBUPROFEN OR NAPROXEN**  FOLLOW UP IN 4 MOS.

## 2019-10-16 NOTE — H&P (View-Only) (Signed)
Subjective:    Patient ID: Melissa Barajas, female    DOB: [birth year], 79 y.o.   MRN: [id number]  HPI Every day she has upper abdominal pain(SHARP UPPER AND AROUND NAVEL/UPPER). Doesn't hurt a long time(. When she eats sweets seems to make it worse. TRYING TO AVOID REFLUX TRIGGERS. HEARTBURN: CONTROLLED WITH PROTONIX BID. BMs: EVERY DAY. MAY HAVE CHEST DISCOMFORT DUE TO "GAS". FEELS LIKE SOMETHING IN HER THROAT WHEN SHE SWALLOWS. WORRIED SHE MIGHT HAVE STOMACH CANER. HER SISTER AND HUSBAND HAD CANCER. WAS DRINKING LEMON/VINEGAR FOR PAST YEAR. LAST EGD/DIL FEB 2017.   PT DENIES FEVER, CHILLS, HEMATOCHEZIA, HEMATEMESIS, nausea, vomiting, melena, diarrhea, CHEST PAIN, SHORTNESS OF BREATH, CHANGE IN BOWEL IN HABITS, constipation, problems swallowing, problems with sedation, OR heartburn or indigestion.  Past Medical History:  Diagnosis Date  . Arthritis   . Atrial fibrillation ([place])   . Collagen vascular disease ([place])   . DIVERTICULOSIS OF COLON 08/25/2010   Qualifier: Diagnosis of  By: Hoy Morn    . GERD (gastroesophageal reflux disease)   . HEMORRHOIDS, INTERNAL 08/25/2010   Qualifier: Diagnosis of  By: Hoy Morn    . Hypercholesteremia   . Hypertension   . Lipoma of arm    Left    Past Surgical History:  Procedure Laterality Date  . ABDOMINAL HYSTERECTOMY     Partial  . COLONOSCOPY  2009   SLF: 1. screening colonoscopy in 10 years 2. She should follow a high fiber diet She has been given a hand out on high fiber diverticulosois and hemorrhoids  . COLONOSCOPY N/A 02/07/2018   Procedure: COLONOSCOPY;  Surgeon: Danie Binder, MD;  [facility];  Service: Endoscopy;  Laterality: N/A;  1:00pm  . CYSTECTOMY     Left breast  . ESOPHAGOGASTRODUODENOSCOPY     [id number] appearing Schatzki's ring, possible cervical esophageal web, both dilated with passage of a Maloney dilator/small hiatal hernia  . ESOPHAGOGASTRODUODENOSCOPY N/A 10/25/2015   Procedure:  ESOPHAGOGASTRODUODENOSCOPY (EGD);  Surgeon: Danie Binder, MD;  [facility];  Service: Endoscopy;  Laterality: N/A;  1215pm  . POLYPECTOMY  02/07/2018   Procedure: POLYPECTOMY;  Surgeon: Danie Binder, MD;  [facility];  Service: Endoscopy;;  ascending colon, descending, sigmoid  . SAVORY DILATION N/A 10/25/2015   Procedure: SAVORY DILATION;  Surgeon: Danie Binder, MD;  [facility];  Service: Endoscopy;  Laterality: N/A;  . THYROIDECTOMY    . Tumor   05/2010   Left breast under nipple  . 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 Aricept [Donepezil Hcl]     Sleep deprived  . Hydrocodone Nausea Only  . Lisinopril     Angio edema  . Naprosyn [Naproxen]     nausea  . Vioxx [Rofecoxib]     Unknown reaction    Current Outpatient Medications  Medication Sig    . acetaminophen (TYLENOL) 500 MG tablet Take 1,000 mg by mouth 2 (two) times daily as needed for moderate pain or headache.    . albuterol (VENTOLIN HFA) 108 (90 Base) MCG/ACT inhaler Inhale 2 puffs into the lungs every 4 (four) hours as needed for wheezing or shortness of breath.    Marland Kitchen BREO ELLIPTA 200-25 MCG/INH AEPB INHALE 1 PUFF INTO THE LUNGS DAILY    . Coenzyme Q10 (COQ10 PO) Take by mouth daily.    Marland Kitchen diltiazem (CARDIZEM CD) 300 MG 24 hr capsule  TAKE 1 CAPSULE BY MOUTH  DAILY    . Flaxseed, Linseed, (FLAX SEED OIL PO) Take by mouth daily.    Marland Kitchen OVER THE COUNTER MEDICATION Take 4 tablets by mouth daily. Focus factor     . pantoprazole (PROTONIX) 40 MG tablet TAKE 1 TABLET BY MOUTH  TWICE DAILY    . Polyethyl Glycol-Propyl Glycol (SYSTANE OP) Place 2 drops into both eyes daily.    . potassium chloride (KLOR-CON) 10 MEQ tablet TAKE 1 TABLET BY MOUTH  TWICE DAILY    . pravastatin (PRAVACHOL) 80 MG tablet TAKE 1 TABLET BY MOUTH  DAILY    . Probiotic Product (PROBIOTIC DAILY PO) Take 1 tablet by mouth daily.     .      .      .       Review  of Systems PER HPI OTHERWISE ALL SYSTEMS ARE NEGATIVE.    Objective:   Physical Exam Constitutional:      General: She is not in acute distress.    Appearance: Normal appearance.  HENT:     Mouth/Throat:     Comments: MASK IN PLACE Eyes:     General: No scleral icterus.    Pupils: Pupils are equal, round, and reactive to light.  Cardiovascular:     Rate and Rhythm: Normal rate and regular rhythm.     Pulses: Normal pulses.     Heart sounds: Normal heart sounds.  Pulmonary:     Effort: Pulmonary effort is normal.     Breath sounds: Normal breath sounds.  Abdominal:     General: Bowel sounds are normal.     Palpations: Abdomen is soft.     Tenderness: There is no abdominal tenderness.  Musculoskeletal:     Cervical back: Normal range of motion.     Right lower leg: No edema.     Left lower leg: No edema.  Lymphadenopathy:     Cervical: No cervical adenopathy.  Skin:    General: Skin is warm and dry.  Neurological:     Mental Status: She is alert and oriented to person, place, and time.     Comments: NO  NEW FOCAL DEFICITS  Psychiatric:        Mood and Affect: Mood normal.     Comments: NORMAL AFFECT       Assessment & Plan:

## 2019-10-16 NOTE — Patient Instructions (Addendum)
EAT TO LIVE AND THINK OF FOOD AS MEDICINE. 75% OF YOUR PLATE SHOULD BE FRUITS/VEGGIES.  To have more energy, and to lose weight:      1. CONTINUE YOUR WEIGHT LOSS EFFORTS. I RECOMMEND YOU READ AND FOLLOW RECOMMENDATIONS BY DR. MARK HYMAN, "10-DAY DETOX DIET".    2. If you must eat bread, EAT EZEKIEL BREAD. IT IS IN THE FROZEN SECTION OF THE GROCERY STORE.    3. DRINK WATER WITH FRUIT OR CUCUMBER ADDED. YOUR URINE SHOULD BE LIGHT YELLOW. AVOID SODA, GATORADE, ENERGY DRINKS, OR DIET SODA.     4. AVOID HIGH FRUCTOSE CORN SYRUP AND CAFFEINE.     5. DO NOT chew SUGAR FREE GUM OR USE ARTIFICIAL SWEETENERS. IF NEEDED USE STEVIA AS A SWEETENER.    6. DO NOT EAT ENRICHED WHEAT FLOUR, PASTA, RICE, OR CEREAL.    7. ONLY EAT WILD CAUGHT SEAFOOD, GRASS FED BEEF OR CHICKEN, PORK FROM PASTURE RAISE PIGS, OR EGGS FROM PASTURE RAISED CHICKENS.    8. PRACTICE CHAIR YOGA FOR 15-30 MINS 3 OR 4 TIMES A WEEK AND PROGRESS TO HATHA YOGA OVER NEXT 6 MOS.    9. START TAKING A MULTIVITAMIN, VITAMIN B12, AND VITAMIN D3 2000 IU DAILY.   FOR 3 MOS TAKE THESE ADDITIONAL SUPPLEMENTS TO DECREASE CRAVING AND SUPPRESS YOUR APPETITE:    1. CINNAMON 500 MG EVERY AM PRIOR TO FIRST MEAL.   **STABILIZES BLOOD GLUCOSE/REDUCES CRAVINGS**    2. CHROMIUM 400-500 MG WITH MEALS TWICE DAILY.    **FAT BURNER**    3. GREEN TEA EXTRACT ONE DAILY.   **FAT BURNER/SUPPRESSES YOUR APPETITE**    4. ALPHA LIPOIC ACID TWICE DAILY.   **NATURAL ANTI-INFLAMMATORY SUPPLEMENT THAT IS AN ALTERNATIVE TO IBUPROFEN OR NAPROXEN**   COMPLETE  UPPER ENDOSCOPY TO STRETCH YOUR ESOPHAGUS WITHIN THE NEXT 4-6 WEEKS.  FOLLOW UP IN 4 MOS.

## 2019-10-16 NOTE — Assessment & Plan Note (Signed)
SYMPTOMS NOT IDEALLY CONTROLLED. DIFFERENTIAL DIAGNOSIS INCLUDES: PEPTIC STRICTURE, PRIMARY ESOPHAGEAL MOTILITY DISORDER,OR 2o ESOPHAGEAL MOTILITY DISORDER DUE TO UNCONTROLLED REFLUX, LESS LIKELY ESOPHAGEAL CANCER.   AVOID REFLUX TRIGGERS.  HANDOUT GIVEN. FOLLOW A SOFT MECHANICAL DIET.  MEATS SHOULD BE GROUND ONLY.  ALL VEGGIES AND FRUITS SHOULD BE SOFT LIKE MASHED POTATOES. CONTINUE PROTONIX. TAKE 30 MINUTES PRIOR TO MEALS TWICE DAILY. COMPLETE  UPPER ENDOSCOPY TO STRETCH YOUR ESOPHAGUS WITHIN THE NEXT 4-6 WEEKS.  DISCUSSED PROCEDURE, BENEFITS, & RISKS: < 1% chance of medication reaction, bleeding, perforation, or ASPIRATION. FOLLOW UP IN 4 MOS.

## 2019-10-16 NOTE — Progress Notes (Signed)
Cc'ed to pcp °

## 2019-10-16 NOTE — Telephone Encounter (Signed)
Called pt and scheduled her procedure for 10/24/2019 and her COVID screening on 10/22/2019.  Discussed prep instructions by phone and will mail them out as well.  Pt voiced understanding.

## 2019-10-16 NOTE — Progress Notes (Signed)
Subjective:    Patient ID: Melissa Barajas, female    DOB: November 25, 1940, 79 y.o.   MRN: [id number]  HPI Every day she has upper abdominal pain(SHARP UPPER AND AROUND NAVEL/UPPER). Doesn't hurt a long time(. When she eats sweets seems to make it worse. TRYING TO AVOID REFLUX TRIGGERS. HEARTBURN: CONTROLLED WITH PROTONIX BID. BMs: EVERY DAY. MAY HAVE CHEST DISCOMFORT DUE TO "GAS". FEELS LIKE SOMETHING IN HER THROAT WHEN SHE SWALLOWS. WORRIED SHE MIGHT HAVE STOMACH CANER. HER SISTER AND HUSBAND HAD CANCER. WAS DRINKING LEMON/VINEGAR FOR PAST YEAR. LAST EGD/DIL FEB 2017.   PT DENIES FEVER, CHILLS, HEMATOCHEZIA, HEMATEMESIS, nausea, vomiting, melena, diarrhea, CHEST PAIN, SHORTNESS OF BREATH, CHANGE IN BOWEL IN HABITS, constipation, problems swallowing, problems with sedation, OR heartburn or indigestion.  Past Medical History:  Diagnosis Date  . Arthritis   . Atrial fibrillation ([place])   . Collagen vascular disease ([place])   . DIVERTICULOSIS OF COLON 08/25/2010   Qualifier: Diagnosis of  By: Hoy Morn    . GERD (gastroesophageal reflux disease)   . HEMORRHOIDS, INTERNAL 08/25/2010   Qualifier: Diagnosis of  By: Hoy Morn    . Hypercholesteremia   . Hypertension   . Lipoma of arm    Left    Past Surgical History:  Procedure Laterality Date  . ABDOMINAL HYSTERECTOMY     Partial  . COLONOSCOPY  2009   SLF: 1. screening colonoscopy in 10 years 2. She should follow a high fiber diet She has been given a hand out on high fiber diverticulosois and hemorrhoids  . COLONOSCOPY N/A 02/07/2018   Procedure: COLONOSCOPY;  Surgeon: Danie Binder, MD;  [facility];  Service: Endoscopy;  Laterality: N/A;  1:00pm  . CYSTECTOMY     Left breast  . ESOPHAGOGASTRODUODENOSCOPY     [id number] appearing Schatzki's ring, possible cervical esophageal web, both dilated with passage of a Maloney dilator/small hiatal hernia  . ESOPHAGOGASTRODUODENOSCOPY N/A 10/25/2015   Procedure:  ESOPHAGOGASTRODUODENOSCOPY (EGD);  Surgeon: Danie Binder, MD;  [facility];  Service: Endoscopy;  Laterality: N/A;  1215pm  . POLYPECTOMY  02/07/2018   Procedure: POLYPECTOMY;  Surgeon: Danie Binder, MD;  [facility];  Service: Endoscopy;;  ascending colon, descending, sigmoid  . SAVORY DILATION N/A 10/25/2015   Procedure: SAVORY DILATION;  Surgeon: Danie Binder, MD;  [facility];  Service: Endoscopy;  Laterality: N/A;  . THYROIDECTOMY    . Tumor   05/2010   Left breast under nipple  . 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 Aricept [Donepezil Hcl]     Sleep deprived  . Hydrocodone Nausea Only  . Lisinopril     Angio edema  . Naprosyn [Naproxen]     nausea  . Vioxx [Rofecoxib]     Unknown reaction    Current Outpatient Medications  Medication Sig    . acetaminophen (TYLENOL) 500 MG tablet Take 1,000 mg by mouth 2 (two) times daily as needed for moderate pain or headache.    . albuterol (VENTOLIN HFA) 108 (90 Base) MCG/ACT inhaler Inhale 2 puffs into the lungs every 4 (four) hours as needed for wheezing or shortness of breath.    Marland Kitchen BREO ELLIPTA 200-25 MCG/INH AEPB INHALE 1 PUFF INTO THE LUNGS DAILY    . Coenzyme Q10 (COQ10 PO) Take by mouth daily.    Marland Kitchen diltiazem (CARDIZEM CD) 300 MG 24 hr capsule  TAKE 1 CAPSULE BY MOUTH  DAILY    . Flaxseed, Linseed, (FLAX SEED OIL PO) Take by mouth daily.    Marland Kitchen OVER THE COUNTER MEDICATION Take 4 tablets by mouth daily. Focus factor     . pantoprazole (PROTONIX) 40 MG tablet TAKE 1 TABLET BY MOUTH  TWICE DAILY    . Polyethyl Glycol-Propyl Glycol (SYSTANE OP) Place 2 drops into both eyes daily.    . potassium chloride (KLOR-CON) 10 MEQ tablet TAKE 1 TABLET BY MOUTH  TWICE DAILY    . pravastatin (PRAVACHOL) 80 MG tablet TAKE 1 TABLET BY MOUTH  DAILY    . Probiotic Product (PROBIOTIC DAILY PO) Take 1 tablet by mouth daily.     .      .      .       Review  of Systems PER HPI OTHERWISE ALL SYSTEMS ARE NEGATIVE.    Objective:   Physical Exam Constitutional:      General: She is not in acute distress.    Appearance: Normal appearance.  HENT:     Mouth/Throat:     Comments: MASK IN PLACE Eyes:     General: No scleral icterus.    Pupils: Pupils are equal, round, and reactive to light.  Cardiovascular:     Rate and Rhythm: Normal rate and regular rhythm.     Pulses: Normal pulses.     Heart sounds: Normal heart sounds.  Pulmonary:     Effort: Pulmonary effort is normal.     Breath sounds: Normal breath sounds.  Abdominal:     General: Bowel sounds are normal.     Palpations: Abdomen is soft.     Tenderness: There is no abdominal tenderness.  Musculoskeletal:     Cervical back: Normal range of motion.     Right lower leg: No edema.     Left lower leg: No edema.  Lymphadenopathy:     Cervical: No cervical adenopathy.  Skin:    General: Skin is warm and dry.  Neurological:     Mental Status: She is alert and oriented to person, place, and time.     Comments: NO  NEW FOCAL DEFICITS  Psychiatric:        Mood and Affect: Mood normal.     Comments: NORMAL AFFECT       Assessment & Plan:

## 2019-10-21 ENCOUNTER — Telehealth: Payer: Self-pay | Admitting: Family Medicine

## 2019-10-21 MED ORDER — SUCRALFATE 1 G PO TABS: 1.0000 g | ORAL_TABLET | Freq: Four times a day (QID) | ORAL | 1 refills | Status: DC

## 2019-10-21 NOTE — Telephone Encounter (Signed)
May have 2 refills 

## 2019-10-21 NOTE — Telephone Encounter (Signed)
Pharmacy requesting refill on sucralfate 1 gm tablet. Take one tablet po four times daily. Pt last seen 09/22/19 for muscle cramp. Please advise. Thank you.

## 2019-10-21 NOTE — Telephone Encounter (Signed)
Prescription sent electronically to pharmacy. 

## 2019-10-21 NOTE — Addendum Note (Signed)
Addended by: Dairl Ponder on: 10/21/2019 01:36 PM   Modules accepted: Orders

## 2019-10-22 ENCOUNTER — Ambulatory Visit: Payer: Medicare Other | Attending: Internal Medicine

## 2019-10-22 ENCOUNTER — Other Ambulatory Visit (HOSPITAL_COMMUNITY)
Admission: RE | Admit: 2019-10-22 | Discharge: 2019-10-22 | Disposition: A | Discharge status: Home / Self Care | Payer: Medicare Other | Source: Ambulatory Visit | Attending: Gastroenterology | Admitting: Gastroenterology

## 2019-10-22 ENCOUNTER — Other Ambulatory Visit: Payer: Self-pay

## 2019-10-22 DIAGNOSIS — Z23 Encounter for immunization: Secondary | ICD-10-CM

## 2019-10-22 DIAGNOSIS — Z01812 Encounter for preprocedural laboratory examination: Secondary | ICD-10-CM | POA: Diagnosis not present

## 2019-10-22 DIAGNOSIS — Z20822 Contact with and (suspected) exposure to covid-19: Secondary | ICD-10-CM | POA: Diagnosis not present

## 2019-10-22 LAB — SARS CORONAVIRUS 2 (TAT 6-24 HRS): SARS Coronavirus 2: NEGATIVE

## 2019-10-22 NOTE — Progress Notes (Signed)
   Covid-19 Vaccination Clinic  Name:  Melissa Barajas    MRN: [id number] DOB: [birth year]  10/22/2019  [name] was observed post Covid-19 immunization for 15 minutes without incidence. She was provided with Vaccine Information Sheet and instruction to access the V-Safe system.   [name] was instructed to call 911 with any severe reactions post vaccine: Marland Kitchen Difficulty breathing  . Swelling of your face and throat  . A fast heartbeat  . A bad rash all over your body  . Dizziness and weakness    Immunizations Administered    Name Date Dose VIS Date Route   Pfizer COVID-19 Vaccine 10/22/2019  9:37 AM 0.3 mL 08/22/2019 Intramuscular   Manufacturer: [place]   Lot: [id number]   [place]: [id number]

## 2019-10-24 ENCOUNTER — Other Ambulatory Visit: Payer: Self-pay

## 2019-10-24 ENCOUNTER — Encounter (HOSPITAL_COMMUNITY): Payer: Self-pay | Admitting: Gastroenterology

## 2019-10-24 ENCOUNTER — Encounter (HOSPITAL_COMMUNITY)
Admission: RE | Disposition: A | Discharge status: Home / Self Care | Payer: Self-pay | Source: Home / Self Care | Attending: Gastroenterology

## 2019-10-24 ENCOUNTER — Ambulatory Visit (HOSPITAL_COMMUNITY)
Admission: RE | Admit: 2019-10-24 | Discharge: 2019-10-24 | Disposition: A | Discharge status: Home / Self Care | Payer: Medicare Other | Attending: Gastroenterology | Admitting: Gastroenterology

## 2019-10-24 DIAGNOSIS — R1310 Dysphagia, unspecified: Secondary | ICD-10-CM | POA: Diagnosis not present

## 2019-10-24 DIAGNOSIS — K2970 Gastritis, unspecified, without bleeding: Secondary | ICD-10-CM | POA: Insufficient documentation

## 2019-10-24 DIAGNOSIS — Z79899 Other long term (current) drug therapy: Secondary | ICD-10-CM | POA: Diagnosis not present

## 2019-10-24 DIAGNOSIS — E7800 Pure hypercholesterolemia, unspecified: Secondary | ICD-10-CM | POA: Insufficient documentation

## 2019-10-24 DIAGNOSIS — K222 Esophageal obstruction: Secondary | ICD-10-CM | POA: Diagnosis not present

## 2019-10-24 DIAGNOSIS — R1319 Other dysphagia: Secondary | ICD-10-CM

## 2019-10-24 DIAGNOSIS — K219 Gastro-esophageal reflux disease without esophagitis: Secondary | ICD-10-CM | POA: Insufficient documentation

## 2019-10-24 DIAGNOSIS — Z7951 Long term (current) use of inhaled steroids: Secondary | ICD-10-CM | POA: Insufficient documentation

## 2019-10-24 DIAGNOSIS — K449 Diaphragmatic hernia without obstruction or gangrene: Secondary | ICD-10-CM | POA: Insufficient documentation

## 2019-10-24 DIAGNOSIS — I10 Essential (primary) hypertension: Secondary | ICD-10-CM | POA: Insufficient documentation

## 2019-10-24 DIAGNOSIS — I4891 Unspecified atrial fibrillation: Secondary | ICD-10-CM | POA: Diagnosis not present

## 2019-10-24 DIAGNOSIS — K3189 Other diseases of stomach and duodenum: Secondary | ICD-10-CM

## 2019-10-24 DIAGNOSIS — K2950 Unspecified chronic gastritis without bleeding: Secondary | ICD-10-CM | POA: Diagnosis not present

## 2019-10-24 HISTORY — PX: ESOPHAGOGASTRODUODENOSCOPY: SHX5428

## 2019-10-24 HISTORY — PX: SAVORY DILATION: SHX5439

## 2019-10-24 HISTORY — PX: BIOPSY: SHX5522

## 2019-10-24 SURGERY — EGD (ESOPHAGOGASTRODUODENOSCOPY)
Anesthesia: Moderate Sedation

## 2019-10-24 MED ORDER — MIDAZOLAM HCL 5 MG/5ML IJ SOLN
INTRAMUSCULAR | Status: DC | PRN
  Administered 2019-10-24: 1 mg via INTRAVENOUS
  Administered 2019-10-24 (×2): 2 mg via INTRAVENOUS

## 2019-10-24 MED ORDER — SODIUM CHLORIDE 0.9 % IV SOLN: INTRAVENOUS | Status: DC

## 2019-10-24 MED ORDER — MIDAZOLAM HCL 5 MG/5ML IJ SOLN
INTRAMUSCULAR | Status: AC
  Filled 2019-10-24: qty 10

## 2019-10-24 MED ORDER — MEPERIDINE HCL 100 MG/ML IJ SOLN
INTRAMUSCULAR | Status: AC
  Filled 2019-10-24: qty 2

## 2019-10-24 MED ORDER — LIDOCAINE VISCOUS HCL 2 % MT SOLN
OROMUCOSAL | Status: DC | PRN
  Administered 2019-10-24: 1 via OROMUCOSAL

## 2019-10-24 MED ORDER — MINERAL OIL PO OIL
TOPICAL_OIL | ORAL | Status: AC
  Filled 2019-10-24: qty 30

## 2019-10-24 MED ORDER — MEPERIDINE HCL 100 MG/ML IJ SOLN
INTRAMUSCULAR | Status: DC | PRN
  Administered 2019-10-24: 25 mg via INTRAVENOUS
  Administered 2019-10-24: 50 mg via INTRAVENOUS

## 2019-10-24 MED ORDER — ONDANSETRON HCL 4 MG/2ML IJ SOLN
INTRAMUSCULAR | Status: AC
  Filled 2019-10-24: qty 2

## 2019-10-24 MED ORDER — STERILE WATER FOR IRRIGATION IR SOLN
Status: DC | PRN
  Administered 2019-10-24: 09:00:00 1.5 mL

## 2019-10-24 MED ORDER — LIDOCAINE VISCOUS HCL 2 % MT SOLN
OROMUCOSAL | Status: AC
  Filled 2019-10-24: qty 15

## 2019-10-24 MED ORDER — ONDANSETRON HCL 4 MG/2ML IJ SOLN
4.0000 mg | Freq: Once | INTRAMUSCULAR | Status: AC
  Administered 2019-10-24: 4 mg via INTRAVENOUS

## 2019-10-24 NOTE — Discharge Instructions (Signed)
I dilated your esophagus. You had a ring near the base of your esophagus. You have ONE STOMACH NODULE. I biopsied your NODULE.   DRINK WATER TO KEEP YOUR URINE LIGHT YELLOW.  FOLLOW A LOW FAT DIET. AVOID FRIED FOODS. MEATS SHOULD BE BAKED, BROILED, OR BOILED. AVOID FAST FOOD. SEE INFO BELOW.   CONTINUE PROTONIX. TAKE 30 MINUTES PRIOR TO MEALS TWICE DAILY.   YOUR BIOPSY RESULTS WILL BE BACK IN 5 BUSINESS DAYS. IF IT IS NON DIAGNOSTIC YOUMAY NEED ANOTHER UPPER ENDOSCOPY IN [place].  FOLLOW UP IN 4 MOS.  UPPER ENDOSCOPY AFTER CARE Read the instructions outlined below and refer to this sheet in the next week. These discharge instructions provide you with general information on caring for yourself after you leave the hospital. While your treatment has been planned according to the most current medical practices available, unavoidable complications occasionally occur. If you have any problems or questions after discharge, call DR. Cordarrell Sane, [phone].  ACTIVITY  You may resume your regular activity, but move at a slower pace for the next 24 hours.   Take frequent rest periods for the next 24 hours.   Walking will help get rid of the air and reduce the bloated feeling in your belly (abdomen).   No driving for 24 hours (because of the medicine (anesthesia) used during the test).   You may shower.   Do not sign any important legal documents or operate any machinery for 24 hours (because of the anesthesia used during the test).    NUTRITION  Drink plenty of fluids.   You may resume your normal diet as instructed by your doctor.   Begin with a light meal and progress to your normal diet. Heavy or fried foods are harder to digest and may make you feel sick to your stomach (nauseated).   Avoid alcoholic beverages for 24 hours or as instructed.    MEDICATIONS  You may resume your normal medications.   WHAT YOU CAN EXPECT TODAY  Some feelings of bloating in the abdomen.    Passage of more gas than usual.    IF YOU HAD A BIOPSY TAKEN DURING THE UPPER ENDOSCOPY:  Eat a soft diet IF YOU HAVE NAUSEA, BLOATING, ABDOMINAL PAIN, OR VOMITING.    FINDING OUT THE RESULTS OF YOUR TEST Not all test results are available during your visit. DR. Oneida Alar WILL CALL YOU WITHIN 7 DAYS OF YOUR PROCEDUE WITH YOUR RESULTS. Do not assume everything is normal if you have not heard from DR. Thora Scherman IN ONE WEEK, CALL HER OFFICE AT [phone].  SEEK IMMEDIATE MEDICAL ATTENTION AND CALL THE OFFICE: [phone] IF:  You have more than a spotting of blood in your stool.   Your belly is swollen (abdominal distention).   You are nauseated or vomiting.   You have a temperature over 101F.   You have abdominal pain or discomfort that is severe or gets worse throughout the day.   Hiatal Hernia A hiatal hernia occurs when a part of the stomach slides above the diaphragm. The diaphragm is the thin muscle separating the belly (abdomen) from the chest. A hiatal hernia can be something you are born with or develop over time. Hiatal hernias may allow stomach acid to flow back into your esophagus, the tube which carries food from your mouth to your stomach. If this acid causes problems it is called GERD (gastro-esophageal reflux disease).   ESOPHAGEAL RING Esophageal RING can be caused by stomach acid backing up into the tube  that carries food from the mouth down to the stomach (lower esophagus).  TREATMENT There are a number of medicines used to treat reflux/stricture, including: Antacids.  Proton-pump inhibitors: PROTONIX ZANTAC OR TAGAMET    Lifestyle and home remedies TO CONTROL HEARTBURN/REFLUX  You may eliminate or reduce the frequency of heartburn by making the following lifestyle changes:  . Control your weight. Being overweight is a major risk factor for heartburn and GERD. Excess pounds put pressure on your abdomen, pushing up your stomach and causing acid to back up into  your esophagus.   . Eat smaller meals. 4 TO 6 MEALS A DAY. This reduces pressure on the lower esophageal sphincter, helping to prevent the valve from opening and acid from washing back into your esophagus.   Dolphus Jenny your belt. Clothes that fit tightly around your waist put pressure on your abdomen and the lower esophageal sphincter.   . Eliminate heartburn triggers. Everyone has specific triggers. Common triggers such as fatty or fried foods, spicy food, tomato sauce, carbonated beverages, alcohol, chocolate, mint, garlic, onion, caffeine and nicotine may make heartburn worse.   Marland Kitchen Avoid stooping or bending. Tying your shoes is OK. Bending over for longer periods to weed your garden isn't, especially soon after eating.   . Don't lie down after a meal. Wait at least three to four hours after eating before going to bed, and don't lie down right after eating.   Marland Kitchen SLEEP WITH ON A WEDGE OR PUT THE HEAD OF YOUR BED ON 6 INCH BLOCKS TO KEEP YOUR HEAD ABOVE YOUR HEART WHILE YOU SLEEP.  Alternative medicine . Several home remedies exist for treating GERD, but they provide only temporary relief. They include drinking baking soda (sodium bicarbonate) added to water or drinking other fluids such as baking soda mixed with cream of tartar and water. . Although these liquids create temporary relief by neutralizing, washing away or buffering acids, eventually they aggravate the situation by adding gas and fluid to your stomach, increasing pressure and causing more acid reflux. Further, adding more sodium to your diet may increase your blood pressure and add stress to your heart, and excessive bicarbonate ingestion can alter the acid-base balance in your body.

## 2019-10-24 NOTE — Op Note (Signed)
[facility] Patient Name: Carmon Agredano Procedure Date: 10/24/2019 7:36 AM MRN: [id number] Date of Birth: [birth year] Attending MD: Barney Drain MD, MD CSN: [id number] Age: 79 Admit Type: Outpatient Procedure:                Upper GI endoscopy WITH ESOPHAGEAL DILATION/COLD                            FORCEPS BIOPSY Indications:              Dysphagia Providers:                Barney Drain MD, MD, Charlsie Quest. Theda Sers RN, RN,                            Aram Candela Referring MD:             Elayne Snare. Luking Medicines:                Meperidine 75 mg IV, Midazolam 5 mg IV, Ondansetron                            4 mg IV Complications:            No immediate complications. Estimated Blood Loss:     Estimated blood loss was minimal. Procedure:                Pre-Anesthesia Assessment:                           - Prior to the procedure, a History and Physical                            was performed, and patient medications and                            allergies were reviewed. The patient's tolerance of                            previous anesthesia was also reviewed. The risks                            and benefits of the procedure and the sedation                            options and risks were discussed with the patient.                            All questions were answered, and informed consent                            was obtained. Prior Anticoagulants: The patient has                            taken no previous anticoagulant or antiplatelet  agents. ASA Grade Assessment: II - A patient with                            mild systemic disease. After reviewing the risks                            and benefits, the patient was deemed in                            satisfactory condition to undergo the procedure.                            After obtaining informed consent, the endoscope was                            passed under direct vision. Throughout the                             procedure, the patient's blood pressure, pulse, and                            oxygen saturations were monitored continuously. The                            [id number]-[id number]) scope was introduced through the                            mouth, and advanced to the duodenal bulb. The upper                            GI endoscopy was accomplished without difficulty.                            The patient tolerated the procedure well. Scope In: 9:00:35 AM Scope Out: 9:07:55 AM Total Procedure Duration: 0 hours 7 minutes 20 seconds  Findings:      A low-grade of narrowing Schatzki ring was found at the gastroesophageal       junction. A guidewire was placed and the scope was withdrawn. Dilation       was performed with a Savary dilator with mild resistance at 15 mm, 16 mm       and 17 mm. TRACE HEME.      A single medium FIRM, PROBABLE submucosal papule (nodule) with no       bleeding and no stigmata of recent bleeding was found in the cardia.       TUNNEL Biopsies(3) were taken with a cold forceps for histology.      A medium-sized hiatal hernia was present.      The examined duodenum was normal. Impression:               - Low-grade of narrowing Schatzki ring. Dilated.                           - A single submucosal papule (nodule) found in the  stomach. Biopsied.                           - Medium-sized hiatal hernia. Moderate Sedation:      Moderate (conscious) sedation was administered by the endoscopy nurse       and supervised by the endoscopist. The following parameters were       monitored: oxygen saturation, heart rate, blood pressure, and response       to care. Total physician intraservice time was 19 minutes. Recommendation:           - Patient has a contact number available for                            emergencies. The signs and symptoms of potential                            delayed complications were discussed with the                             patient. Return to normal activities tomorrow.                            Written discharge instructions were provided to the                            patient.                           - Low fat diet. AVOID GERD TRIGGERS.                           - Continue present medications. PROTONIX BID.                           - Await pathology results. WILL NEED EUS IN [place] TO                            FURTHER EVALUATE GASTRIC NODULE.                           - Return to GI clinic in 4 months. Procedure Code(s):        --- Professional ---                           [id number], Esophagogastroduodenoscopy, flexible,                            transoral; with insertion of guide wire followed by                            passage of dilator(s) through esophagus over guide                            wire  [id number], 59, Esophagogastroduodenoscopy, flexible,                            transoral; with biopsy, single or multiple                           G0500, Moderate sedation services provided by the                            same physician or other qualified health care                            professional performing a gastrointestinal                            endoscopic service that sedation supports,                            requiring the presence of an independent trained                            observer to assist in the monitoring of the                            patient's level of consciousness and physiological                            status; initial 15 minutes of intra-service time;                            patient age 70 years or older (additional time may                            be reported with [phone], as appropriate) Diagnosis Code(s):        --- Professional ---                           K22.2, Esophageal obstruction                           K31.89, Other diseases of stomach and duodenum                           K44.9,  Diaphragmatic hernia without obstruction or                            gangrene                           R13.10, Dysphagia, unspecified CPT copyright 2019 [employer]. All rights reserved. The codes documented in this report are preliminary and upon coder review may  be revised to meet current compliance requirements. Barney Drain, MD Barney Drain MD, MD 10/24/2019 9:52:50 AM This report has been signed electronically. Number of Addenda: 0

## 2019-10-24 NOTE — Interval H&P Note (Signed)
History and Physical Interval Note:  10/24/2019 8:42 AM  Melissa Barajas  has presented today for surgery, with the diagnosis of dyspepsia, dysphagia.  The various methods of treatment have been discussed with the patient and family. After consideration of risks, benefits and other options for treatment, the patient has consented to  Procedure(s) with comments: ESOPHAGOGASTRODUODENOSCOPY (EGD) (N/A) - 8:30am SAVORY DILATION (N/A) as a surgical intervention.  The patient's history has been reviewed, patient examined, no change in status, stable for surgery.  I have reviewed the patient's chart and labs.  Questions were answered to the patient's satisfaction.     [employer]

## 2019-10-27 LAB — SURGICAL PATHOLOGY

## 2019-10-28 ENCOUNTER — Other Ambulatory Visit: Payer: Self-pay

## 2019-10-28 DIAGNOSIS — K3189 Other diseases of stomach and duodenum: Secondary | ICD-10-CM

## 2019-10-28 NOTE — Progress Notes (Signed)
PATIENT SCHEDULED  °

## 2019-11-10 HISTORY — PX: EUS: SHX5427

## 2019-11-19 ENCOUNTER — Telehealth: Payer: Self-pay | Admitting: Gastroenterology

## 2019-11-19 NOTE — Telephone Encounter (Signed)
[phone]  PATIENT STATED WE REFERRED HER TO A PRACTICE IN [place] FOR HER CONDITION AND SHE CAN NOT REMEMBER THE NAME OF THE PHYSICIAN    PLEASE CALL HER

## 2019-11-19 NOTE — Telephone Encounter (Signed)
See referral notes she was sent to Dr. Erlinda Hong office.  Called pt and she is aware

## 2019-11-20 DIAGNOSIS — K3189 Other diseases of stomach and duodenum: Secondary | ICD-10-CM | POA: Diagnosis not present

## 2019-11-23 ENCOUNTER — Other Ambulatory Visit: Payer: Self-pay | Admitting: Family Medicine

## 2019-11-23 DIAGNOSIS — R05 Cough: Secondary | ICD-10-CM

## 2019-11-23 DIAGNOSIS — R059 Cough, unspecified: Secondary | ICD-10-CM

## 2019-11-26 ENCOUNTER — Other Ambulatory Visit: Payer: Self-pay

## 2019-11-26 ENCOUNTER — Ambulatory Visit: Payer: Medicare Other | Attending: Internal Medicine

## 2019-11-26 DIAGNOSIS — Z20822 Contact with and (suspected) exposure to covid-19: Secondary | ICD-10-CM

## 2019-11-27 LAB — NOVEL CORONAVIRUS, NAA: SARS-CoV-2, NAA: NOT DETECTED

## 2019-12-02 DIAGNOSIS — R1010 Upper abdominal pain, unspecified: Secondary | ICD-10-CM | POA: Diagnosis not present

## 2019-12-02 DIAGNOSIS — K3189 Other diseases of stomach and duodenum: Secondary | ICD-10-CM | POA: Diagnosis not present

## 2019-12-10 ENCOUNTER — Other Ambulatory Visit: Payer: Self-pay | Admitting: Gastroenterology

## 2019-12-10 DIAGNOSIS — R1010 Upper abdominal pain, unspecified: Secondary | ICD-10-CM

## 2019-12-15 ENCOUNTER — Ambulatory Visit (HOSPITAL_COMMUNITY)
Admission: RE | Admit: 2019-12-15 | Discharge: 2019-12-15 | Disposition: A | Discharge status: Home / Self Care | Payer: Medicare Other | Source: Ambulatory Visit | Attending: Gastroenterology | Admitting: Gastroenterology

## 2019-12-15 ENCOUNTER — Other Ambulatory Visit: Payer: Self-pay

## 2019-12-15 DIAGNOSIS — K449 Diaphragmatic hernia without obstruction or gangrene: Secondary | ICD-10-CM | POA: Diagnosis not present

## 2019-12-15 DIAGNOSIS — R1010 Upper abdominal pain, unspecified: Secondary | ICD-10-CM | POA: Diagnosis not present

## 2019-12-15 DIAGNOSIS — K760 Fatty (change of) liver, not elsewhere classified: Secondary | ICD-10-CM | POA: Diagnosis not present

## 2019-12-15 LAB — POCT I-STAT CREATININE: Creatinine, Ser: 0.9 mg/dL (ref 0.44–1.00)

## 2019-12-15 MED ORDER — IOHEXOL 300 MG/ML  SOLN
100.0000 mL | Freq: Once | INTRAMUSCULAR | Status: AC | PRN
  Administered 2019-12-15: 100 mL via INTRAVENOUS

## 2020-01-05 ENCOUNTER — Other Ambulatory Visit: Payer: Self-pay | Admitting: Family Medicine

## 2020-02-03 ENCOUNTER — Ambulatory Visit (INDEPENDENT_AMBULATORY_CARE_PROVIDER_SITE_OTHER): Payer: Medicare Other | Admitting: Family Medicine

## 2020-02-03 ENCOUNTER — Other Ambulatory Visit: Payer: Self-pay

## 2020-02-03 ENCOUNTER — Encounter: Payer: Self-pay | Admitting: Family Medicine

## 2020-02-03 VITALS — BP 144/82 | Temp 97.3°F | Ht 66.0 in | Wt 198.0 lb

## 2020-02-03 DIAGNOSIS — I10 Essential (primary) hypertension: Secondary | ICD-10-CM

## 2020-02-03 DIAGNOSIS — D214 Benign neoplasm of connective and other soft tissue of abdomen: Secondary | ICD-10-CM | POA: Diagnosis not present

## 2020-02-03 DIAGNOSIS — K219 Gastro-esophageal reflux disease without esophagitis: Secondary | ICD-10-CM | POA: Diagnosis not present

## 2020-02-03 DIAGNOSIS — E785 Hyperlipidemia, unspecified: Secondary | ICD-10-CM | POA: Diagnosis not present

## 2020-02-03 DIAGNOSIS — Z79899 Other long term (current) drug therapy: Secondary | ICD-10-CM | POA: Diagnosis not present

## 2020-02-03 HISTORY — DX: Benign neoplasm of connective and other soft tissue of abdomen: D21.4

## 2020-02-03 NOTE — Patient Instructions (Addendum)
DASH Eating Plan DASH stands for "Dietary Approaches to Stop Hypertension." The DASH eating plan is a healthy eating plan that has been shown to reduce high blood pressure (hypertension). It may also reduce your risk for type 2 diabetes, heart disease, and stroke. The DASH eating plan may also help with weight loss. What are tips for following this plan?  General guidelines  Avoid eating more than 2,300 mg (milligrams) of salt (sodium) a day. If you have hypertension, you may need to reduce your sodium intake to 1,500 mg a day.  Limit alcohol intake to no more than 1 drink a day for nonpregnant women and 2 drinks a day for men. One drink equals 12 oz of beer, 5 oz of wine, or 1 oz of hard liquor.  Work with your health care provider to maintain a healthy body weight or to lose weight. Ask what an ideal weight is for you.  Get at least 30 minutes of exercise that causes your heart to beat faster (aerobic exercise) most days of the week. Activities may include walking, swimming, or biking.  Work with your health care provider or diet and nutrition specialist (dietitian) to adjust your eating plan to your individual calorie needs. Reading food labels   Check food labels for the amount of sodium per serving. Choose foods with less than 5 percent of the Daily Value of sodium. Generally, foods with less than 300 mg of sodium per serving fit into this eating plan.  To find whole grains, look for the word "whole" as the first word in the ingredient list. Shopping  Buy products labeled as "low-sodium" or "no salt added."  Buy fresh foods. Avoid canned foods and premade or frozen meals. Cooking  Avoid adding salt when cooking. Use salt-free seasonings or herbs instead of table salt or sea salt. Check with your health care provider or pharmacist before using salt substitutes.  Do not fry foods. Cook foods using healthy methods such as baking, boiling, grilling, and broiling instead.  Cook with  heart-healthy oils, such as olive, canola, soybean, or sunflower oil. Meal planning  Eat a balanced diet that includes: ? 5 or more servings of fruits and vegetables each day. At each meal, try to fill half of your plate with fruits and vegetables. ? Up to 6-8 servings of whole grains each day. ? Less than 6 oz of lean meat, poultry, or fish each day. A 3-oz serving of meat is about the same size as a deck of cards. One egg equals 1 oz. ? 2 servings of low-fat dairy each day. ? A serving of nuts, seeds, or beans 5 times each week. ? Heart-healthy fats. Healthy fats called Omega-3 fatty acids are found in foods such as flaxseeds and coldwater fish, like sardines, salmon, and mackerel.  Limit how much you eat of the following: ? Canned or prepackaged foods. ? Food that is high in trans fat, such as fried foods. ? Food that is high in saturated fat, such as fatty meat. ? Sweets, desserts, sugary drinks, and other foods with added sugar. ? Full-fat dairy products.  Do not salt foods before eating.  Try to eat at least 2 vegetarian meals each week.  Eat more home-cooked food and less restaurant, buffet, and fast food.  When eating at a restaurant, ask that your food be prepared with less salt or no salt, if possible. What foods are recommended? The items listed may not be a complete list. Talk with your dietitian about   what dietary choices are best for you. Grains Whole-grain or whole-wheat bread. Whole-grain or whole-wheat pasta. Brown rice. Oatmeal. Quinoa. Bulgur. Whole-grain and low-sodium cereals. Pita bread. Low-fat, low-sodium crackers. Whole-wheat flour tortillas. Vegetables Fresh or frozen vegetables (raw, steamed, roasted, or grilled). Low-sodium or reduced-sodium tomato and vegetable juice. Low-sodium or reduced-sodium tomato sauce and tomato paste. Low-sodium or reduced-sodium canned vegetables. Fruits All fresh, dried, or frozen fruit. Canned fruit in natural juice (without  added sugar). Meat and other protein foods Skinless chicken or turkey. Ground chicken or turkey. Pork with fat trimmed off. Fish and seafood. Egg whites. Dried beans, peas, or lentils. Unsalted nuts, nut butters, and seeds. Unsalted canned beans. Lean cuts of beef with fat trimmed off. Low-sodium, lean deli meat. Dairy Low-fat (1%) or fat-free (skim) milk. Fat-free, low-fat, or reduced-fat cheeses. Nonfat, low-sodium ricotta or cottage cheese. Low-fat or nonfat yogurt. Low-fat, low-sodium cheese. Fats and oils Soft margarine without trans fats. Vegetable oil. Low-fat, reduced-fat, or light mayonnaise and salad dressings (reduced-sodium). Canola, safflower, olive, soybean, and sunflower oils. Avocado. Seasoning and other foods Herbs. Spices. Seasoning mixes without salt. Unsalted popcorn and pretzels. Fat-free sweets. What foods are not recommended? The items listed may not be a complete list. Talk with your dietitian about what dietary choices are best for you. Grains Baked goods made with fat, such as croissants, muffins, or some breads. Dry pasta or rice meal packs. Vegetables Creamed or fried vegetables. Vegetables in a cheese sauce. Regular canned vegetables (not low-sodium or reduced-sodium). Regular canned tomato sauce and paste (not low-sodium or reduced-sodium). Regular tomato and vegetable juice (not low-sodium or reduced-sodium). Pickles. Olives. Fruits Canned fruit in a light or heavy syrup. Fried fruit. Fruit in cream or butter sauce. Meat and other protein foods Fatty cuts of meat. Ribs. Fried meat. Bacon. Sausage. Bologna and other processed lunch meats. Salami. Fatback. Hotdogs. Bratwurst. Salted nuts and seeds. Canned beans with added salt. Canned or smoked fish. Whole eggs or egg yolks. Chicken or turkey with skin. Dairy Whole or 2% milk, cream, and half-and-half. Whole or full-fat cream cheese. Whole-fat or sweetened yogurt. Full-fat cheese. Nondairy creamers. Whipped toppings.  Processed cheese and cheese spreads. Fats and oils Butter. Stick margarine. Lard. Shortening. Ghee. Bacon fat. Tropical oils, such as coconut, palm kernel, or palm oil. Seasoning and other foods Salted popcorn and pretzels. Onion salt, garlic salt, seasoned salt, table salt, and sea salt. Worcestershire sauce. Tartar sauce. Barbecue sauce. Teriyaki sauce. Soy sauce, including reduced-sodium. Steak sauce. Canned and packaged gravies. Fish sauce. Oyster sauce. Cocktail sauce. Horseradish that you find on the shelf. Ketchup. Mustard. Meat flavorings and tenderizers. Bouillon cubes. Hot sauce and Tabasco sauce. Premade or packaged marinades. Premade or packaged taco seasonings. Relishes. Regular salad dressings. Where to find more information:  National Heart, Lung, and Blood Institute: www.nhlbi.nih.gov  American Heart Association: www.heart.org Summary  The DASH eating plan is a healthy eating plan that has been shown to reduce high blood pressure (hypertension). It may also reduce your risk for type 2 diabetes, heart disease, and stroke.  With the DASH eating plan, you should limit salt (sodium) intake to 2,300 mg a day. If you have hypertension, you may need to reduce your sodium intake to 1,500 mg a day.  When on the DASH eating plan, aim to eat more fresh fruits and vegetables, whole grains, lean proteins, low-fat dairy, and heart-healthy fats.  Work with your health care provider or diet and nutrition specialist (dietitian) to adjust your eating plan to your   individual calorie needs. This information is not intended to replace advice given to you by your health care provider. Make sure you discuss any questions you have with your health care provider. Document Revised: 08/10/2017 Document Reviewed: 08/21/2016 Elsevier Patient Education  2020 [employer]  DASH Eating Plan DASH stands for "Dietary Approaches to Stop Hypertension." The DASH eating plan is a healthy eating plan that has  been shown to reduce high blood pressure (hypertension). It may also reduce your risk for type 2 diabetes, heart disease, and stroke. The DASH eating plan may also help with weight loss. What are tips for following this plan?  General guidelines  Avoid eating more than 2,300 mg (milligrams) of salt (sodium) a day. If you have hypertension, you may need to reduce your sodium intake to 1,500 mg a day.  Limit alcohol intake to no more than 1 drink a day for nonpregnant women and 2 drinks a day for men. One drink equals 12 oz of beer, 5 oz of wine, or 1 oz of hard liquor.  Work with your health care provider to maintain a healthy body weight or to lose weight. Ask what an ideal weight is for you.  Get at least 30 minutes of exercise that causes your heart to beat faster (aerobic exercise) most days of the week. Activities may include walking, swimming, or biking.  Work with your health care provider or diet and nutrition specialist (dietitian) to adjust your eating plan to your individual calorie needs. Reading food labels   Check food labels for the amount of sodium per serving. Choose foods with less than 5 percent of the Daily Value of sodium. Generally, foods with less than 300 mg of sodium per serving fit into this eating plan.  To find whole grains, look for the word "whole" as the first word in the ingredient list. Shopping  Buy products labeled as "low-sodium" or "no salt added."  Buy fresh foods. Avoid canned foods and premade or frozen meals. Cooking  Avoid adding salt when cooking. Use salt-free seasonings or herbs instead of table salt or sea salt. Check with your health care provider or pharmacist before using salt substitutes.  Do not fry foods. Cook foods using healthy methods such as baking, boiling, grilling, and broiling instead.  Cook with heart-healthy oils, such as olive, canola, soybean, or sunflower oil. Meal planning  Eat a balanced diet that includes: ? 5 or  more servings of fruits and vegetables each day. At each meal, try to fill half of your plate with fruits and vegetables. ? Up to 6-8 servings of whole grains each day. ? Less than 6 oz of lean meat, poultry, or fish each day. A 3-oz serving of meat is about the same size as a deck of cards. One egg equals 1 oz. ? 2 servings of low-fat dairy each day. ? A serving of nuts, seeds, or beans 5 times each week. ? Heart-healthy fats. Healthy fats called Omega-3 fatty acids are found in foods such as flaxseeds and coldwater fish, like sardines, salmon, and mackerel.  Limit how much you eat of the following: ? Canned or prepackaged foods. ? Food that is high in trans fat, such as fried foods. ? Food that is high in saturated fat, such as fatty meat. ? Sweets, desserts, sugary drinks, and other foods with added sugar. ? Full-fat dairy products.  Do not salt foods before eating.  Try to eat at least 2 vegetarian meals each week.  Eat more   home-cooked food and less restaurant, buffet, and fast food.  When eating at a restaurant, ask that your food be prepared with less salt or no salt, if possible. What foods are recommended? The items listed may not be a complete list. Talk with your dietitian about what dietary choices are best for you. Grains Whole-grain or whole-wheat bread. Whole-grain or whole-wheat pasta. Brown rice. Oatmeal. Quinoa. Bulgur. Whole-grain and low-sodium cereals. Pita bread. Low-fat, low-sodium crackers. Whole-wheat flour tortillas. Vegetables Fresh or frozen vegetables (raw, steamed, roasted, or grilled). Low-sodium or reduced-sodium tomato and vegetable juice. Low-sodium or reduced-sodium tomato sauce and tomato paste. Low-sodium or reduced-sodium canned vegetables. Fruits All fresh, dried, or frozen fruit. Canned fruit in natural juice (without added sugar). Meat and other protein foods Skinless chicken or turkey. Ground chicken or turkey. Pork with fat trimmed off. Fish  and seafood. Egg whites. Dried beans, peas, or lentils. Unsalted nuts, nut butters, and seeds. Unsalted canned beans. Lean cuts of beef with fat trimmed off. Low-sodium, lean deli meat. Dairy Low-fat (1%) or fat-free (skim) milk. Fat-free, low-fat, or reduced-fat cheeses. Nonfat, low-sodium ricotta or cottage cheese. Low-fat or nonfat yogurt. Low-fat, low-sodium cheese. Fats and oils Soft margarine without trans fats. Vegetable oil. Low-fat, reduced-fat, or light mayonnaise and salad dressings (reduced-sodium). Canola, safflower, olive, soybean, and sunflower oils. Avocado. Seasoning and other foods Herbs. Spices. Seasoning mixes without salt. Unsalted popcorn and pretzels. Fat-free sweets. What foods are not recommended? The items listed may not be a complete list. Talk with your dietitian about what dietary choices are best for you. Grains Baked goods made with fat, such as croissants, muffins, or some breads. Dry pasta or rice meal packs. Vegetables Creamed or fried vegetables. Vegetables in a cheese sauce. Regular canned vegetables (not low-sodium or reduced-sodium). Regular canned tomato sauce and paste (not low-sodium or reduced-sodium). Regular tomato and vegetable juice (not low-sodium or reduced-sodium). Pickles. Olives. Fruits Canned fruit in a light or heavy syrup. Fried fruit. Fruit in cream or butter sauce. Meat and other protein foods Fatty cuts of meat. Ribs. Fried meat. Bacon. Sausage. Bologna and other processed lunch meats. Salami. Fatback. Hotdogs. Bratwurst. Salted nuts and seeds. Canned beans with added salt. Canned or smoked fish. Whole eggs or egg yolks. Chicken or turkey with skin. Dairy Whole or 2% milk, cream, and half-and-half. Whole or full-fat cream cheese. Whole-fat or sweetened yogurt. Full-fat cheese. Nondairy creamers. Whipped toppings. Processed cheese and cheese spreads. Fats and oils Butter. Stick margarine. Lard. Shortening. Ghee. Bacon fat. Tropical oils,  such as coconut, palm kernel, or palm oil. Seasoning and other foods Salted popcorn and pretzels. Onion salt, garlic salt, seasoned salt, table salt, and sea salt. Worcestershire sauce. Tartar sauce. Barbecue sauce. Teriyaki sauce. Soy sauce, including reduced-sodium. Steak sauce. Canned and packaged gravies. Fish sauce. Oyster sauce. Cocktail sauce. Horseradish that you find on the shelf. Ketchup. Mustard. Meat flavorings and tenderizers. Bouillon cubes. Hot sauce and Tabasco sauce. Premade or packaged marinades. Premade or packaged taco seasonings. Relishes. Regular salad dressings. Where to find more information:  National Heart, Lung, and Blood Institute: www.nhlbi.nih.gov  American Heart Association: www.heart.org Summary  The DASH eating plan is a healthy eating plan that has been shown to reduce high blood pressure (hypertension). It may also reduce your risk for type 2 diabetes, heart disease, and stroke.  With the DASH eating plan, you should limit salt (sodium) intake to 2,300 mg a day. If you have hypertension, you may need to reduce your sodium intake to 1,500   mg a day.  When on the DASH eating plan, aim to eat more fresh fruits and vegetables, whole grains, lean proteins, low-fat dairy, and heart-healthy fats.  Work with your health care provider or diet and nutrition specialist (dietitian) to adjust your eating plan to your individual calorie needs. This information is not intended to replace advice given to you by your health care provider. Make sure you discuss any questions you have with your health care provider. Document Revised: 08/10/2017 Document Reviewed: 08/21/2016 Elsevier Patient Education  2020 [employer].

## 2020-02-03 NOTE — Progress Notes (Signed)
   Subjective:    Patient ID: Melissa Barajas, female    DOB: 1940/12/06, 79 y.o.   MRN: [id number]  Hyperlipidemia Pertinent negatives include no chest pain or shortness of breath. Treatments tried: pravastatin 80mg     Pt wants to discuss endoscopy results. States she was told she had a mass but not what to do about it. Pt wonders if pantoprazole caused the mass.  I reviewed over the ultrasound reviewed over the EGD reviewed over the labs with the patient answered multiple questions  Fall Risk  09/22/2019 09/08/2019 08/28/2019 08/06/2019 07/29/2018  Falls in the past year? 0 0 0 0 0  Comment - - - [facility] Telephone Survey: data to providers prior to load [employer] Telephone Survey: data to providers prior to load  Number falls in past yr: - - - - -  Comment - - - - -  Injury with Fall? - - - - -  Follow up Falls evaluation completed Falls evaluation completed Falls evaluation completed - -      Review of Systems  Constitutional: Negative for activity change, appetite change and fatigue.  HENT: Negative for congestion and rhinorrhea.   Respiratory: Negative for cough and shortness of breath.   Cardiovascular: Negative for chest pain and leg swelling.  Gastrointestinal: Negative for abdominal pain and diarrhea.  Endocrine: Negative for polydipsia and polyphagia.  Skin: Negative for color change.  Neurological: Negative for dizziness and weakness.  Psychiatric/Behavioral: Negative for behavioral problems and confusion.       Objective:   Physical Exam Vitals reviewed.  Constitutional:      General: She is not in acute distress. HENT:     Head: Normocephalic and atraumatic.  Eyes:     General:        Right eye: No discharge.        Left eye: No discharge.  Neck:     Trachea: No tracheal deviation.  Cardiovascular:     Rate and Rhythm: Normal rate and regular rhythm.     Heart sounds: Normal heart sounds. No murmur.  Pulmonary:     Effort: Pulmonary effort is normal. No respiratory  distress.     Breath sounds: Normal breath sounds.  Lymphadenopathy:     Cervical: No cervical adenopathy.  Skin:    General: Skin is warm and dry.  Neurological:     Mental Status: She is alert.     Coordination: Coordination normal.  Psychiatric:        Behavior: Behavior normal.           Assessment & Plan:  1. Essential hypertension Blood pressure is elevated but I would like to see doing better she will work harder on diet and she will follow-up again in 3 to 4 weeks. - Basic metabolic panel  2. Gastroesophageal reflux disease, unspecified whether esophagitis present She is concerned that pantoprazole could have caused the leiomyoma I doubt that this is the case we will pose this question to gastroenterology and get back with her  3. Leiomyoma of stomach Ultrasound of her abdomen/stomach by the specialist at [facility] showed a probable leiomyoma unlikely that this will cause a problem but we will see if gastroenterology recommends any type of follow-up scoping  4. Hyperlipidemia, unspecified hyperlipidemia type Patient will check lab work - Lipid panel  5. High risk medication use Patient will check lab work - Hepatic function panel Recheck patient in 3 to 4 weeks to recheck blood pressure

## 2020-02-05 DIAGNOSIS — Z79899 Other long term (current) drug therapy: Secondary | ICD-10-CM | POA: Diagnosis not present

## 2020-02-05 DIAGNOSIS — I10 Essential (primary) hypertension: Secondary | ICD-10-CM | POA: Diagnosis not present

## 2020-02-05 DIAGNOSIS — E785 Hyperlipidemia, unspecified: Secondary | ICD-10-CM | POA: Diagnosis not present

## 2020-02-06 LAB — LIPID PANEL
Chol/HDL Ratio: 3.9 ratio (ref 0.0–4.4)
Cholesterol, Total: 189 mg/dL (ref 100–199)
HDL: 48 mg/dL (ref >39)
LDL Chol Calc (NIH): 126 mg/dL — ABNORMAL HIGH (ref 0–99)
Triglycerides: 83 mg/dL (ref 0–149)
VLDL Cholesterol Cal: 15 mg/dL (ref 5–40)

## 2020-02-06 LAB — BASIC METABOLIC PANEL
BUN/Creatinine Ratio: 12 (ref 12–28)
BUN: 12 mg/dL (ref 8–27)
CO2: 25 mmol/L (ref 20–29)
Calcium: 10.3 mg/dL (ref 8.7–10.3)
Chloride: 103 mmol/L (ref 96–106)
Creatinine, Ser: 0.99 mg/dL (ref 0.57–1.00)
GFR calc Af Amer: 64 mL/min/{1.73_m2} (ref >59)
GFR calc non Af Amer: 56 mL/min/{1.73_m2} — ABNORMAL LOW (ref >59)
Glucose: 109 mg/dL — ABNORMAL HIGH (ref 65–99)
Potassium: 4.5 mmol/L (ref 3.5–5.2)
Sodium: 142 mmol/L (ref 134–144)

## 2020-02-06 LAB — HEPATIC FUNCTION PANEL
ALT: 13 IU/L (ref 0–32)
AST: 21 IU/L (ref 0–40)
Albumin: 4.4 g/dL (ref 3.7–4.7)
Alkaline Phosphatase: 109 IU/L (ref 48–121)
Bilirubin Total: 0.6 mg/dL (ref 0.0–1.2)
Bilirubin, Direct: 0.2 mg/dL (ref 0.00–0.40)
Total Protein: 7.4 g/dL (ref 6.0–8.5)

## 2020-02-12 DIAGNOSIS — K3189 Other diseases of stomach and duodenum: Secondary | ICD-10-CM | POA: Diagnosis not present

## 2020-02-12 DIAGNOSIS — R109 Unspecified abdominal pain: Secondary | ICD-10-CM | POA: Diagnosis not present

## 2020-02-17 ENCOUNTER — Ambulatory Visit: Payer: Medicare Other | Admitting: Gastroenterology

## 2020-02-24 ENCOUNTER — Ambulatory Visit (INDEPENDENT_AMBULATORY_CARE_PROVIDER_SITE_OTHER): Payer: Medicare Other | Admitting: Gastroenterology

## 2020-02-24 ENCOUNTER — Ambulatory Visit (INDEPENDENT_AMBULATORY_CARE_PROVIDER_SITE_OTHER): Payer: Medicare Other | Admitting: Family Medicine

## 2020-02-24 ENCOUNTER — Encounter: Payer: Self-pay | Admitting: Gastroenterology

## 2020-02-24 ENCOUNTER — Other Ambulatory Visit: Payer: Self-pay

## 2020-02-24 VITALS — BP 171/73 | HR 65 | Temp 96.6°F | Ht 66.0 in | Wt 196.0 lb

## 2020-02-24 VITALS — BP 142/86 | Temp 97.2°F | Wt 196.2 lb

## 2020-02-24 DIAGNOSIS — D214 Benign neoplasm of connective and other soft tissue of abdomen: Secondary | ICD-10-CM | POA: Diagnosis not present

## 2020-02-24 DIAGNOSIS — I10 Essential (primary) hypertension: Secondary | ICD-10-CM | POA: Diagnosis not present

## 2020-02-24 DIAGNOSIS — K219 Gastro-esophageal reflux disease without esophagitis: Secondary | ICD-10-CM

## 2020-02-24 MED ORDER — DILTIAZEM HCL ER COATED BEADS 360 MG PO CP24
360.0000 mg | ORAL_CAPSULE | Freq: Every day | ORAL | 1 refills | Status: DC
Start: 2020-02-24 — End: 2020-08-03

## 2020-02-24 MED ORDER — ROSUVASTATIN CALCIUM 20 MG PO TABS
20.0000 mg | ORAL_TABLET | Freq: Every day | ORAL | 1 refills | Status: DC
Start: 2020-02-24 — End: 2020-08-03

## 2020-02-24 NOTE — Patient Instructions (Addendum)
I would like for you to try Dexilant once daily. Stop Protonix for now while taking Dexilant. Let me know how it works for you!!  You can add Benefiber 2 teaspoons each morning, mixing with liquid. This can help with productive bowel movements.  I will see you in 6 months!! Please call if any concerns or persistent symptoms.  It was a pleasure to see you today. I want to create trusting relationships with patients to provide genuine, compassionate, and quality care. I value your feedback. If you receive a survey regarding your visit,  I greatly appreciate you taking time to fill this out.   Annitta Needs, PhD, ANP-BC [facility]    Food Choices for Gastroesophageal Reflux Disease, Adult When you have gastroesophageal reflux disease (GERD), the foods you eat and your eating habits are very important. Choosing the right foods can help ease your discomfort. Think about working with a nutrition specialist (dietitian) to help you make good choices. What are tips for following this plan?  Meals  Choose healthy foods that are low in fat, such as fruits, vegetables, whole grains, low-fat dairy products, and lean meat, fish, and poultry.  Eat small meals often instead of 3 large meals a day. Eat your meals slowly, and in a place where you are relaxed. Avoid bending over or lying down until 2-3 hours after eating.  Avoid eating meals 2-3 hours before bed.  Avoid drinking a lot of liquid with meals.  Cook foods using methods other than frying. Bake, grill, or broil food instead.  Avoid or limit: ? Chocolate. ? Peppermint or spearmint. ? Alcohol. ? Pepper. ? Black and decaffeinated coffee. ? Black and decaffeinated tea. ? Bubbly (carbonated) soft drinks. ? Caffeinated energy drinks and soft drinks.  Limit high-fat foods such as: ? Fatty meat or fried foods. ? Whole milk, cream, butter, or ice cream. ? Nuts and nut butters. ? Pastries, donuts, and sweets made with butter  or shortening.  Avoid foods that cause symptoms. These foods may be different for everyone. Common foods that cause symptoms include: ? Tomatoes. ? Oranges, lemons, and limes. ? Peppers. ? Spicy food. ? Onions and garlic. ? Vinegar. Lifestyle  Maintain a healthy weight. Ask your doctor what weight is healthy for you. If you need to lose weight, work with your doctor to do so safely.  Exercise for at least 30 minutes for 5 or more days each week, or as told by your doctor.  Wear loose-fitting clothes.  Do not smoke. If you need help quitting, ask your doctor.  Sleep with the head of your bed higher than your feet. Use a wedge under the mattress or blocks under the bed frame to raise the head of the bed. Summary  When you have gastroesophageal reflux disease (GERD), food and lifestyle choices are very important in easing your symptoms.  Eat small meals often instead of 3 large meals a day. Eat your meals slowly, and in a place where you are relaxed.  Limit high-fat foods such as fatty meat or fried foods.  Avoid bending over or lying down until 2-3 hours after eating.  Avoid peppermint and spearmint, caffeine, alcohol, and chocolate. This information is not intended to replace advice given to you by your health care provider. Make sure you discuss any questions you have with your health care provider. Document Revised: 12/19/2018 Document Reviewed: 10/03/2016 Elsevier Patient Education  [employer].

## 2020-02-24 NOTE — Progress Notes (Signed)
Referring Provider: Kathyrn Drown, MD Primary Care Physician:  Kathyrn Drown, MD  Primary GI: previously Dr. Oneida Alar   Chief Complaint  Patient presents with  . Follow-up    fu EGD, acid reflux    HPI:   Melissa Barajas is a 79 y.o. female presenting today with a history of GERD, abdominal pain, and dysphagia, s/p EGD Feb 2021 with Schatzki's ring s/p dilation and single medium firm, probable submucosal nodule without bleeding. Medium sized hiatal hernia, normal duodenum. EUS completed March 2021 with likely leiomyoma, no other concerns. 10 mm by 10 mm.   Still with LUQ pain. Present since August. Pops up every now and then. Knows she has to stay away from tomatoes and chocolate. Burning in esophagus yesterday. Ate strawberries, bananas, blueberries for breakfast. Supper had cabbage and spinach. Sweet potatoes. Cooked with bacon droppings. No weight loss. Appetite is good. She has been on Protonix BID chronically.   Past Medical History:  Diagnosis Date  . Arthritis   . Atrial fibrillation ([place])   . Collagen vascular disease ([place])   . DIVERTICULOSIS OF COLON 08/25/2010   Qualifier: Diagnosis of  By: Hoy Morn    . GERD (gastroesophageal reflux disease)   . HEMORRHOIDS, INTERNAL 08/25/2010   Qualifier: Diagnosis of  By: Hoy Morn    . Hypercholesteremia   . Hypertension   . 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 Lipoma of arm    Left    Past Surgical History:  Procedure Laterality Date  . ABDOMINAL HYSTERECTOMY     Partial  . BIOPSY  10/24/2019   Procedure: BIOPSY;  Surgeon: Danie Binder, MD;  [facility];  Service: Endoscopy;;  . COLONOSCOPY  2009   SLF: 1. screening colonoscopy in 10 years 2. She should follow a high fiber diet She has been given a hand out on high fiber diverticulosois and hemorrhoids  . COLONOSCOPY N/A 02/07/2018   Procedure: COLONOSCOPY;  Surgeon: Danie Binder, MD;  [facility];  Service: Endoscopy;  Laterality: N/A;  1:00pm  . CYSTECTOMY     Left breast  . ESOPHAGOGASTRODUODENOSCOPY     [id number] appearing Schatzki's ring, possible cervical esophageal web, both dilated with passage of a Maloney dilator/small hiatal hernia  . ESOPHAGOGASTRODUODENOSCOPY N/A 10/25/2015   Procedure: ESOPHAGOGASTRODUODENOSCOPY (EGD);  Surgeon: Danie Binder, MD;  [facility];  Service: Endoscopy;  Laterality: N/A;  1215pm  . ESOPHAGOGASTRODUODENOSCOPY N/A 10/24/2019   Schatzki's ring s/p dilation and single medium firm, probable submucosal nodule without bleeding. Medium sized hiatal hernia, normal duodenum.   . EUS  11/2019   Dr. Paulita Fujita: EUS completed March 2021 with likely leiomyoma, no other concerns. 10 mm by 10 mm.   Marland Kitchen POLYPECTOMY  02/07/2018   Procedure: POLYPECTOMY;  Surgeon: Danie Binder, MD;  [facility];  Service: Endoscopy;;  ascending colon, descending, sigmoid  . SAVORY DILATION N/A 10/25/2015   Procedure: SAVORY DILATION;  Surgeon: Danie Binder, MD;  [facility];  Service: Endoscopy;  Laterality: N/A;  . SAVORY DILATION N/A 10/24/2019   Procedure: SAVORY DILATION;  Surgeon: Danie Binder, MD;  [facility];  Service: Endoscopy;  Laterality: N/A;  . THYROIDECTOMY    . Tumor   05/2010   Left breast under nipple  . 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 acetaminophen (TYLENOL) 500 MG tablet Take 1,000 mg by mouth 2 (two) times daily as needed for moderate pain or headache.    . albuterol (VENTOLIN HFA) 108 (90 Base) MCG/ACT inhaler Inhale 2 puffs into the lungs every 4 (four) hours as needed for wheezing or shortness of breath. 1 Inhaler 0  . Ascorbic Acid (VITAMIN C) 1000 MG tablet Take 1,000 mg by mouth daily.    Marland Kitchen BLACK CURRANT SEED OIL PO Take 5 mLs by mouth daily.    Marland Kitchen BREO ELLIPTA 200-25  MCG/INH AEPB INHALE 1 PUFF INTO THE LUNGS DAILY (Patient taking differently: Inhale 1 puff into the lungs daily. ) 60 each 2  . Cholecalciferol (VITAMIN D3) 50 MCG (2000 UT) TABS Take 2,000 Units by mouth daily.    . Coenzyme Q10-Vitamin E (QUNOL ULTRA COQ10 PO) Take 5 mLs by mouth daily.    Marland Kitchen diltiazem (CARDIZEM CD) 300 MG 24 hr capsule TAKE 1 CAPSULE BY MOUTH  DAILY 90 capsule 0  . milk thistle 175 MG tablet Take 175 mg by mouth daily.    . pantoprazole (PROTONIX) 40 MG tablet TAKE 1 TABLET BY MOUTH  TWICE DAILY 180 tablet 1  . Polyethyl Glycol-Propyl Glycol (SYSTANE OP) Place 2 drops into both eyes 2 (two) times daily.     . potassium chloride (KLOR-CON) 10 MEQ tablet TAKE 1 TABLET BY MOUTH  TWICE DAILY 180 tablet 0  . pravastatin (PRAVACHOL) 80 MG tablet TAKE 1 TABLET BY MOUTH  DAILY 90 tablet 0  . Pumpkin Seed-Soy Germ (AZO BLADDER CONTROL/GO-LESS PO) Take 1 tablet by mouth 2 (two) times daily.    Marland Kitchen Specialty Vitamins Products (BRAIN PO) Take 1 capsule by mouth daily. FOREBRAIN COGNITIVE PERFORMANCE SUPPLEMENT    . Turmeric Curcumin 500 MG CAPS Take 500 mg by mouth daily. W/Ginger Powder     No current facility-administered medications for this visit.    Allergies as of 02/24/2020 - Review Complete 02/24/2020  Allergen Reaction Noted  . Aricept [donepezil hcl]  11/20/2017  . Hydrocodone Nausea Only 01/08/2013  . Lisinopril  01/08/2013  . Naprosyn [naproxen]  04/06/2016  . Vioxx [rofecoxib]  01/08/2013    Family History  Problem Relation Age of Onset  . Cancer Brother        Prostate    Social History   Socioeconomic History  . Marital status: Married    Spouse name: Not on file  . Number of children: Not on file  . Years of education: Not on file  . Highest education level: Not on file  Occupational History  . Not on file  Tobacco Use  . Smoking status: Former Smoker    Packs/day: 1.00    Years: 15.00    Pack years: 15.00    Types: Cigarettes    Start date:  09/12/1983    Quit date: 09/11/1993    Years since quitting: 26.4  . Smokeless tobacco: Never Used  Vaping Use  . Vaping Use: Never used  Substance and Sexual Activity  . Alcohol use: No    Alcohol/week: 0.0 standard drinks  . Drug use: No  . Sexual activity: Not on file  Other Topics Concern  . Not on file  Social History Narrative  . Not on file   Social Determinants of Health   Financial Resource Strain:   . Difficulty of Paying Living Expenses:   Food Insecurity:   . Worried About  Running Out of Food in the Last Year:   . [place] in the Last Year:   Transportation Barajas:   . Lack of Transportation (Medical):   Marland Kitchen Lack of Transportation (Non-Medical):   Physical Activity:   . Days of Exercise per Week:   . Minutes of Exercise per Session:   Stress:   . Feeling of Stress :   Social Connections:   . Frequency of Communication with Friends and Family:   . Frequency of Social Gatherings with Friends and Family:   . Attends Religious Services:   . Active Member of Clubs or Organizations:   . Attends Archivist Meetings:   Marland Kitchen Marital Status:     Review of Systems: Gen: Denies fever, chills, anorexia. Denies fatigue, weakness, weight loss.  CV: Denies chest pain, palpitations, syncope, peripheral edema, and claudication. Resp: Denies dyspnea at rest, cough, wheezing, coughing up blood, and pleurisy. GI: see HPI Derm: Denies rash, itching, dry skin Psych: Denies depression, anxiety, memory loss, confusion. No homicidal or suicidal ideation.  Heme: Denies bruising, bleeding, and enlarged lymph nodes.  Physical Exam: BP (!) 171/73   Pulse 65   Temp (!) 96.6 F (35.9 C) (Temporal)   Ht 5\' 6"  (1.676 m)   Wt 196 lb (88.9 kg)   BMI 31.64 kg/m  General:   Alert and oriented. No distress noted. Pleasant and cooperative.  Head:  Normocephalic and atraumatic. Eyes:  Conjuctiva clear without scleral icterus. Mouth: mask in place Abdomen:  +BS, soft,  non-tender and non-distended. No rebound or guarding. No HSM or masses noted. Msk:  Symmetrical without gross deformities. Normal posture. Extremities:  Without edema. Neurologic:  Alert and  oriented x4 Psych:  Alert and cooperative. Normal mood and affect.  ASSESSMENT: SINIA ANTOSH is a 80 y.o. female presenting today with history of GERD, dysphagia s/p dilation recently much improved, and dyspepsia. Recently found to have likely gastric leiomyoma, 31mmX10mm.   GERD: intermittent breakthrough likely dietary-related. We will trial Dexilant instead of Protonix, and GERD diet reviewed again.   Dysphagia: resolved.  LUQ pain: present since August, exacerbated by certain foods. Trial of Dexilant. No weight loss or other alarm signs/symptoms. Call if persists.   Gastric leiomyoma: small, less than 1 cm. Surveillance to be determined.      PLAN:   Stop Protonix.  Trial of Dexilant once daily  Call if persistent symptoms despite dietary changes  EUS surveillance to be determined  Return in 6 months  Melissa Needs, PhD, [facility]

## 2020-02-24 NOTE — Progress Notes (Signed)
   Subjective:    Patient ID: Melissa Barajas, female    DOB: [birth year], 79 y.o.   MRN: [id number]  Hypertension This is a chronic problem. The current episode started more than 1 year ago. Risk factors for coronary artery disease include post-menopausal state. Treatments tried: cardizem cd. There are no compliance problems.    Patient's blood pressure has been slightly elevated.  She does try to watch her salt.  She does get a lot of muscle cramping.  She does try to stay well-hydrated Patient having problems with cramping and aching in her legs and body  Review of Systems Denies chest tightness pressure pain shortness of breath    Objective:   Physical Exam Lungs clear respiratory rate normal heart regular no murmurs extremities no edema  Fall Risk  02/03/2020 09/22/2019 09/08/2019 08/28/2019 08/06/2019  Falls in the past year? 1 0 0 0 0  Comment - - - - Emmi Telephone Survey: data to providers prior to load  Number falls in past yr: 0 - - - -  Comment - - - - -  Injury with Fall? 0 - - - -  Follow up Falls evaluation completed;Education provided Falls evaluation completed Falls evaluation completed Falls evaluation completed -        Assessment & Plan:  Muscle cramping recent electrolytes look good recommend stretching exercises  HTN slightly elevated bump up the dose of the Cardizem to 360 mg recheck the patient again in 3 to 4 weeks  Healthy diet recommended low salt recommended  Stretching exercises printout given  Patient is concerned pantoprazole is causing a leiomyoma of her abdominal wall.  Patient states she read this in a drug prescription handout she states she will bring 1 to me the next time she sees it It appears that gastroenterology is rechecking her again in 6 months to relook at this area and only if it gets larger with a recommend removing it

## 2020-02-24 NOTE — Patient Instructions (Signed)
DASH Eating Plan DASH stands for "Dietary Approaches to Stop Hypertension." The DASH eating plan is a healthy eating plan that has been shown to reduce high blood pressure (hypertension). It may also reduce your risk for type 2 diabetes, heart disease, and stroke. The DASH eating plan may also help with weight loss. What are tips for following this plan?  General guidelines  Avoid eating more than 2,300 mg (milligrams) of salt (sodium) a day. If you have hypertension, you may need to reduce your sodium intake to 1,500 mg a day.  Limit alcohol intake to no more than 1 drink a day for nonpregnant women and 2 drinks a day for men. One drink equals 12 oz of beer, 5 oz of wine, or 1 oz of hard liquor.  Work with your health care provider to maintain a healthy body weight or to lose weight. Ask what an ideal weight is for you.  Get at least 30 minutes of exercise that causes your heart to beat faster (aerobic exercise) most days of the week. Activities may include walking, swimming, or biking.  Work with your health care provider or diet and nutrition specialist (dietitian) to adjust your eating plan to your individual calorie needs. Reading food labels   Check food labels for the amount of sodium per serving. Choose foods with less than 5 percent of the Daily Value of sodium. Generally, foods with less than 300 mg of sodium per serving fit into this eating plan.  To find whole grains, look for the word "whole" as the first word in the ingredient list. Shopping  Buy products labeled as "low-sodium" or "no salt added."  Buy fresh foods. Avoid canned foods and premade or frozen meals. Cooking  Avoid adding salt when cooking. Use salt-free seasonings or herbs instead of table salt or sea salt. Check with your health care provider or pharmacist before using salt substitutes.  Do not fry foods. Cook foods using healthy methods such as baking, boiling, grilling, and broiling instead.  Cook with  heart-healthy oils, such as olive, canola, soybean, or sunflower oil. Meal planning  Eat a balanced diet that includes: ? 5 or more servings of fruits and vegetables each day. At each meal, try to fill half of your plate with fruits and vegetables. ? Up to 6-8 servings of whole grains each day. ? Less than 6 oz of lean meat, poultry, or fish each day. A 3-oz serving of meat is about the same size as a deck of cards. One egg equals 1 oz. ? 2 servings of low-fat dairy each day. ? A serving of nuts, seeds, or beans 5 times each week. ? Heart-healthy fats. Healthy fats called Omega-3 fatty acids are found in foods such as flaxseeds and coldwater fish, like sardines, salmon, and mackerel.  Limit how much you eat of the following: ? Canned or prepackaged foods. ? Food that is high in trans fat, such as fried foods. ? Food that is high in saturated fat, such as fatty meat. ? Sweets, desserts, sugary drinks, and other foods with added sugar. ? Full-fat dairy products.  Do not salt foods before eating.  Try to eat at least 2 vegetarian meals each week.  Eat more home-cooked food and less restaurant, buffet, and fast food.  When eating at a restaurant, ask that your food be prepared with less salt or no salt, if possible. What foods are recommended? The items listed may not be a complete list. Talk with your dietitian about   what dietary choices are best for you. Grains Whole-grain or whole-wheat bread. Whole-grain or whole-wheat pasta. Brown rice. Oatmeal. Quinoa. Bulgur. Whole-grain and low-sodium cereals. Pita bread. Low-fat, low-sodium crackers. Whole-wheat flour tortillas. Vegetables Fresh or frozen vegetables (raw, steamed, roasted, or grilled). Low-sodium or reduced-sodium tomato and vegetable juice. Low-sodium or reduced-sodium tomato sauce and tomato paste. Low-sodium or reduced-sodium canned vegetables. Fruits All fresh, dried, or frozen fruit. Canned fruit in natural juice (without  added sugar). Meat and other protein foods Skinless chicken or turkey. Ground chicken or turkey. Pork with fat trimmed off. Fish and seafood. Egg whites. Dried beans, peas, or lentils. Unsalted nuts, nut butters, and seeds. Unsalted canned beans. Lean cuts of beef with fat trimmed off. Low-sodium, lean deli meat. Dairy Low-fat (1%) or fat-free (skim) milk. Fat-free, low-fat, or reduced-fat cheeses. Nonfat, low-sodium ricotta or cottage cheese. Low-fat or nonfat yogurt. Low-fat, low-sodium cheese. Fats and oils Soft margarine without trans fats. Vegetable oil. Low-fat, reduced-fat, or light mayonnaise and salad dressings (reduced-sodium). Canola, safflower, olive, soybean, and sunflower oils. Avocado. Seasoning and other foods Herbs. Spices. Seasoning mixes without salt. Unsalted popcorn and pretzels. Fat-free sweets. What foods are not recommended? The items listed may not be a complete list. Talk with your dietitian about what dietary choices are best for you. Grains Baked goods made with fat, such as croissants, muffins, or some breads. Dry pasta or rice meal packs. Vegetables Creamed or fried vegetables. Vegetables in a cheese sauce. Regular canned vegetables (not low-sodium or reduced-sodium). Regular canned tomato sauce and paste (not low-sodium or reduced-sodium). Regular tomato and vegetable juice (not low-sodium or reduced-sodium). Pickles. Olives. Fruits Canned fruit in a light or heavy syrup. Fried fruit. Fruit in cream or butter sauce. Meat and other protein foods Fatty cuts of meat. Ribs. Fried meat. Bacon. Sausage. Bologna and other processed lunch meats. Salami. Fatback. Hotdogs. Bratwurst. Salted nuts and seeds. Canned beans with added salt. Canned or smoked fish. Whole eggs or egg yolks. Chicken or turkey with skin. Dairy Whole or 2% milk, cream, and half-and-half. Whole or full-fat cream cheese. Whole-fat or sweetened yogurt. Full-fat cheese. Nondairy creamers. Whipped toppings.  Processed cheese and cheese spreads. Fats and oils Butter. Stick margarine. Lard. Shortening. Ghee. Bacon fat. Tropical oils, such as coconut, palm kernel, or palm oil. Seasoning and other foods Salted popcorn and pretzels. Onion salt, garlic salt, seasoned salt, table salt, and sea salt. Worcestershire sauce. Tartar sauce. Barbecue sauce. Teriyaki sauce. Soy sauce, including reduced-sodium. Steak sauce. Canned and packaged gravies. Fish sauce. Oyster sauce. Cocktail sauce. Horseradish that you find on the shelf. Ketchup. Mustard. Meat flavorings and tenderizers. Bouillon cubes. Hot sauce and Tabasco sauce. Premade or packaged marinades. Premade or packaged taco seasonings. Relishes. Regular salad dressings. Where to find more information:  National Heart, Lung, and Blood Institute: www.nhlbi.nih.gov  American Heart Association: www.heart.org Summary  The DASH eating plan is a healthy eating plan that has been shown to reduce high blood pressure (hypertension). It may also reduce your risk for type 2 diabetes, heart disease, and stroke.  With the DASH eating plan, you should limit salt (sodium) intake to 2,300 mg a day. If you have hypertension, you may need to reduce your sodium intake to 1,500 mg a day.  When on the DASH eating plan, aim to eat more fresh fruits and vegetables, whole grains, lean proteins, low-fat dairy, and heart-healthy fats.  Work with your health care provider or diet and nutrition specialist (dietitian) to adjust your eating plan to your   individual calorie needs. This information is not intended to replace advice given to you by your health care provider. Make sure you discuss any questions you have with your health care provider. Document Revised: 08/10/2017 Document Reviewed: 08/21/2016 Elsevier Patient Education  2020 Elsevier Inc.  

## 2020-02-24 NOTE — Progress Notes (Signed)
CC'ED TO PCP 

## 2020-03-01 NOTE — Progress Notes (Signed)
Melissa Barajas, Please let the patient know that I did communicate with her gastroenterologist.  They do not feel that the PPI is causing her leiomyoma.  PPIs can sometimes trigger benign gastric polyps but that is not the same thing as a leiomyoma.

## 2020-03-02 NOTE — Progress Notes (Signed)
Patient notified

## 2020-03-02 NOTE — Progress Notes (Signed)
Lmtc

## 2020-03-02 NOTE — Progress Notes (Signed)
Pt was notified.  

## 2020-03-10 ENCOUNTER — Telehealth: Payer: Self-pay | Admitting: Family Medicine

## 2020-03-10 NOTE — Telephone Encounter (Signed)
Patient wanting to know if she can take some magnesia for cramps in her legs and body. She states being going on for months now. Please advise

## 2020-03-11 ENCOUNTER — Encounter: Payer: Self-pay | Admitting: Family Medicine

## 2020-03-12 NOTE — Telephone Encounter (Signed)
Lmtc

## 2020-03-12 NOTE — Telephone Encounter (Signed)
We have checked the magnesium 2 separate times in the past and both were normal If the patient is interested in doing more lab work before her next visit Then we may do the following metabolic 7, magnesium  Unfortunately there is no perfect solution for muscle cramps, stretching exercises, tumeric (mustard) can sometimes be beneficial

## 2020-03-18 NOTE — Telephone Encounter (Signed)
Unable to leave voicemail due to voicemail being full. Sent My chart message.

## 2020-03-22 ENCOUNTER — Other Ambulatory Visit: Payer: Self-pay | Admitting: Family Medicine

## 2020-03-24 ENCOUNTER — Ambulatory Visit (INDEPENDENT_AMBULATORY_CARE_PROVIDER_SITE_OTHER): Payer: Medicare Other | Admitting: Family Medicine

## 2020-03-24 ENCOUNTER — Other Ambulatory Visit: Payer: Self-pay

## 2020-03-24 VITALS — BP 150/78 | Temp 97.7°F | Ht 66.0 in | Wt 195.2 lb

## 2020-03-24 DIAGNOSIS — R252 Cramp and spasm: Secondary | ICD-10-CM | POA: Diagnosis not present

## 2020-03-24 DIAGNOSIS — R5383 Other fatigue: Secondary | ICD-10-CM

## 2020-03-24 MED ORDER — CHLORTHALIDONE 25 MG PO TABS: 25.0000 mg | ORAL_TABLET | Freq: Every day | ORAL | 1 refills | Status: DC

## 2020-03-24 MED ORDER — BREO ELLIPTA 200-25 MCG/INH IN AEPB: INHALATION_SPRAY | RESPIRATORY_TRACT | 2 refills | Status: DC

## 2020-03-24 MED ORDER — FAMOTIDINE 40 MG PO TABS: 40.0000 mg | ORAL_TABLET | Freq: Every day | ORAL | 1 refills | Status: DC

## 2020-03-24 NOTE — Progress Notes (Signed)
° °  Subjective:    Patient ID: Melissa Barajas, female    DOB: 12/19/1940, 79 y.o.   MRN: [id number]  HPI  Patient arrives for a follow up on blood pressure.  Patient also needs refill on Breo Ellipta Patient is taking her medication.  Her blood pressure is higher than what I would like to see it.  Denies any chest tightness pressure pain shortness of breath  Review of Systems    Please see above Objective:   Physical Exam  Lungs clear heart regular pulse normal BP elevated      Assessment & Plan:  1. Muscle cramps Her muscle cramps could be related to potassium or magnesium check lab work await results - Basic metabolic panel - Magnesium  2. Other fatigue Fatigue and tiredness check thyroid function - TSH - T4, free - Basic metabolic panel - Magnesium HTN Subpar control Add diuretic recommend recheck in 2 weeks time will need to recheck metabolic 7 several weeks down the road to look at potassium and kidney function

## 2020-03-24 NOTE — Telephone Encounter (Signed)
Discussed with patient at office visit 03/24/20 and she will discuss with the doctor about need to have blood work.

## 2020-03-24 NOTE — Patient Instructions (Signed)
DASH Eating Plan DASH stands for "Dietary Approaches to Stop Hypertension." The DASH eating plan is a healthy eating plan that has been shown to reduce high blood pressure (hypertension). It may also reduce your risk for type 2 diabetes, heart disease, and stroke. The DASH eating plan may also help with weight loss. What are tips for following this plan?  General guidelines  Avoid eating more than 2,300 mg (milligrams) of salt (sodium) a day. If you have hypertension, you may need to reduce your sodium intake to 1,500 mg a day.  Limit alcohol intake to no more than 1 drink a day for nonpregnant women and 2 drinks a day for men. One drink equals 12 oz of beer, 5 oz of wine, or 1 oz of hard liquor.  Work with your health care provider to maintain a healthy body weight or to lose weight. Ask what an ideal weight is for you.  Get at least 30 minutes of exercise that causes your heart to beat faster (aerobic exercise) most days of the week. Activities may include walking, swimming, or biking.  Work with your health care provider or diet and nutrition specialist (dietitian) to adjust your eating plan to your individual calorie needs. Reading food labels   Check food labels for the amount of sodium per serving. Choose foods with less than 5 percent of the Daily Value of sodium. Generally, foods with less than 300 mg of sodium per serving fit into this eating plan.  To find whole grains, look for the word "whole" as the first word in the ingredient list. Shopping  Buy products labeled as "low-sodium" or "no salt added."  Buy fresh foods. Avoid canned foods and premade or frozen meals. Cooking  Avoid adding salt when cooking. Use salt-free seasonings or herbs instead of table salt or sea salt. Check with your health care provider or pharmacist before using salt substitutes.  Do not fry foods. Cook foods using healthy methods such as baking, boiling, grilling, and broiling instead.  Cook with  heart-healthy oils, such as olive, canola, soybean, or sunflower oil. Meal planning  Eat a balanced diet that includes: ? 5 or more servings of fruits and vegetables each day. At each meal, try to fill half of your plate with fruits and vegetables. ? Up to 6-8 servings of whole grains each day. ? Less than 6 oz of lean meat, poultry, or fish each day. A 3-oz serving of meat is about the same size as a deck of cards. One egg equals 1 oz. ? 2 servings of low-fat dairy each day. ? A serving of nuts, seeds, or beans 5 times each week. ? Heart-healthy fats. Healthy fats called Omega-3 fatty acids are found in foods such as flaxseeds and coldwater fish, like sardines, salmon, and mackerel.  Limit how much you eat of the following: ? Canned or prepackaged foods. ? Food that is high in trans fat, such as fried foods. ? Food that is high in saturated fat, such as fatty meat. ? Sweets, desserts, sugary drinks, and other foods with added sugar. ? Full-fat dairy products.  Do not salt foods before eating.  Try to eat at least 2 vegetarian meals each week.  Eat more home-cooked food and less restaurant, buffet, and fast food.  When eating at a restaurant, ask that your food be prepared with less salt or no salt, if possible. What foods are recommended? The items listed may not be a complete list. Talk with your dietitian about   what dietary choices are best for you. Grains Whole-grain or whole-wheat bread. Whole-grain or whole-wheat pasta. Brown rice. Oatmeal. Quinoa. Bulgur. Whole-grain and low-sodium cereals. Pita bread. Low-fat, low-sodium crackers. Whole-wheat flour tortillas. Vegetables Fresh or frozen vegetables (raw, steamed, roasted, or grilled). Low-sodium or reduced-sodium tomato and vegetable juice. Low-sodium or reduced-sodium tomato sauce and tomato paste. Low-sodium or reduced-sodium canned vegetables. Fruits All fresh, dried, or frozen fruit. Canned fruit in natural juice (without  added sugar). Meat and other protein foods Skinless chicken or turkey. Ground chicken or turkey. Pork with fat trimmed off. Fish and seafood. Egg whites. Dried beans, peas, or lentils. Unsalted nuts, nut butters, and seeds. Unsalted canned beans. Lean cuts of beef with fat trimmed off. Low-sodium, lean deli meat. Dairy Low-fat (1%) or fat-free (skim) milk. Fat-free, low-fat, or reduced-fat cheeses. Nonfat, low-sodium ricotta or cottage cheese. Low-fat or nonfat yogurt. Low-fat, low-sodium cheese. Fats and oils Soft margarine without trans fats. Vegetable oil. Low-fat, reduced-fat, or light mayonnaise and salad dressings (reduced-sodium). Canola, safflower, olive, soybean, and sunflower oils. Avocado. Seasoning and other foods Herbs. Spices. Seasoning mixes without salt. Unsalted popcorn and pretzels. Fat-free sweets. What foods are not recommended? The items listed may not be a complete list. Talk with your dietitian about what dietary choices are best for you. Grains Baked goods made with fat, such as croissants, muffins, or some breads. Dry pasta or rice meal packs. Vegetables Creamed or fried vegetables. Vegetables in a cheese sauce. Regular canned vegetables (not low-sodium or reduced-sodium). Regular canned tomato sauce and paste (not low-sodium or reduced-sodium). Regular tomato and vegetable juice (not low-sodium or reduced-sodium). Pickles. Olives. Fruits Canned fruit in a light or heavy syrup. Fried fruit. Fruit in cream or butter sauce. Meat and other protein foods Fatty cuts of meat. Ribs. Fried meat. Bacon. Sausage. Bologna and other processed lunch meats. Salami. Fatback. Hotdogs. Bratwurst. Salted nuts and seeds. Canned beans with added salt. Canned or smoked fish. Whole eggs or egg yolks. Chicken or turkey with skin. Dairy Whole or 2% milk, cream, and half-and-half. Whole or full-fat cream cheese. Whole-fat or sweetened yogurt. Full-fat cheese. Nondairy creamers. Whipped toppings.  Processed cheese and cheese spreads. Fats and oils Butter. Stick margarine. Lard. Shortening. Ghee. Bacon fat. Tropical oils, such as coconut, palm kernel, or palm oil. Seasoning and other foods Salted popcorn and pretzels. Onion salt, garlic salt, seasoned salt, table salt, and sea salt. Worcestershire sauce. Tartar sauce. Barbecue sauce. Teriyaki sauce. Soy sauce, including reduced-sodium. Steak sauce. Canned and packaged gravies. Fish sauce. Oyster sauce. Cocktail sauce. Horseradish that you find on the shelf. Ketchup. Mustard. Meat flavorings and tenderizers. Bouillon cubes. Hot sauce and Tabasco sauce. Premade or packaged marinades. Premade or packaged taco seasonings. Relishes. Regular salad dressings. Where to find more information:  National Heart, Lung, and Blood Institute: www.nhlbi.nih.gov  American Heart Association: www.heart.org Summary  The DASH eating plan is a healthy eating plan that has been shown to reduce high blood pressure (hypertension). It may also reduce your risk for type 2 diabetes, heart disease, and stroke.  With the DASH eating plan, you should limit salt (sodium) intake to 2,300 mg a day. If you have hypertension, you may need to reduce your sodium intake to 1,500 mg a day.  When on the DASH eating plan, aim to eat more fresh fruits and vegetables, whole grains, lean proteins, low-fat dairy, and heart-healthy fats.  Work with your health care provider or diet and nutrition specialist (dietitian) to adjust your eating plan to your   individual calorie needs. This information is not intended to replace advice given to you by your health care provider. Make sure you discuss any questions you have with your health care provider. Document Revised: 08/10/2017 Document Reviewed: 08/21/2016 Elsevier Patient Education  2020 Elsevier Inc.  

## 2020-03-25 LAB — BASIC METABOLIC PANEL
BUN/Creatinine Ratio: 14 (ref 12–28)
BUN: 14 mg/dL (ref 8–27)
CO2: 22 mmol/L (ref 20–29)
Calcium: 10.2 mg/dL (ref 8.7–10.3)
Chloride: 101 mmol/L (ref 96–106)
Creatinine, Ser: 1 mg/dL (ref 0.57–1.00)
GFR calc Af Amer: 63 mL/min/{1.73_m2} (ref >59)
GFR calc non Af Amer: 55 mL/min/{1.73_m2} — ABNORMAL LOW (ref >59)
Glucose: 88 mg/dL (ref 65–99)
Potassium: 4.3 mmol/L (ref 3.5–5.2)
Sodium: 140 mmol/L (ref 134–144)

## 2020-03-25 LAB — MAGNESIUM: Magnesium: 2.2 mg/dL (ref 1.6–2.3)

## 2020-03-25 LAB — T4, FREE: Free T4: 1.34 ng/dL (ref 0.82–1.77)

## 2020-03-25 LAB — TSH: TSH: 0.6 u[IU]/mL (ref 0.450–4.500)

## 2020-04-05 ENCOUNTER — Observation Stay (HOSPITAL_COMMUNITY)
Admission: EM | Admit: 2020-04-05 | Discharge: 2020-04-06 | Disposition: A | Discharge status: Home / Self Care | Payer: Medicare Other | Attending: Internal Medicine | Admitting: Internal Medicine

## 2020-04-05 ENCOUNTER — Other Ambulatory Visit: Payer: Self-pay

## 2020-04-05 ENCOUNTER — Encounter (HOSPITAL_COMMUNITY)
Admission: EM | Disposition: A | Discharge status: Home / Self Care | Payer: Self-pay | Source: Home / Self Care | Attending: Emergency Medicine

## 2020-04-05 ENCOUNTER — Encounter (HOSPITAL_COMMUNITY): Payer: Self-pay | Admitting: Emergency Medicine

## 2020-04-05 DIAGNOSIS — M1990 Unspecified osteoarthritis, unspecified site: Secondary | ICD-10-CM | POA: Insufficient documentation

## 2020-04-05 DIAGNOSIS — F09 Unspecified mental disorder due to known physiological condition: Secondary | ICD-10-CM

## 2020-04-05 DIAGNOSIS — E7800 Pure hypercholesterolemia, unspecified: Secondary | ICD-10-CM | POA: Diagnosis not present

## 2020-04-05 DIAGNOSIS — Z79899 Other long term (current) drug therapy: Secondary | ICD-10-CM | POA: Insufficient documentation

## 2020-04-05 DIAGNOSIS — E876 Hypokalemia: Secondary | ICD-10-CM | POA: Diagnosis not present

## 2020-04-05 DIAGNOSIS — Z20822 Contact with and (suspected) exposure to covid-19: Secondary | ICD-10-CM | POA: Diagnosis not present

## 2020-04-05 DIAGNOSIS — R195 Other fecal abnormalities: Secondary | ICD-10-CM | POA: Diagnosis present

## 2020-04-05 DIAGNOSIS — D131 Benign neoplasm of stomach: Secondary | ICD-10-CM | POA: Diagnosis not present

## 2020-04-05 DIAGNOSIS — Z791 Long term (current) use of non-steroidal anti-inflammatories (NSAID): Secondary | ICD-10-CM | POA: Diagnosis not present

## 2020-04-05 DIAGNOSIS — Z87891 Personal history of nicotine dependence: Secondary | ICD-10-CM | POA: Diagnosis not present

## 2020-04-05 DIAGNOSIS — D214 Benign neoplasm of connective and other soft tissue of abdomen: Secondary | ICD-10-CM | POA: Insufficient documentation

## 2020-04-05 DIAGNOSIS — R1310 Dysphagia, unspecified: Secondary | ICD-10-CM

## 2020-04-05 DIAGNOSIS — K922 Gastrointestinal hemorrhage, unspecified: Secondary | ICD-10-CM | POA: Diagnosis present

## 2020-04-05 DIAGNOSIS — K5904 Chronic idiopathic constipation: Secondary | ICD-10-CM | POA: Diagnosis present

## 2020-04-05 DIAGNOSIS — K219 Gastro-esophageal reflux disease without esophagitis: Secondary | ICD-10-CM | POA: Diagnosis not present

## 2020-04-05 DIAGNOSIS — I10 Essential (primary) hypertension: Secondary | ICD-10-CM | POA: Insufficient documentation

## 2020-04-05 DIAGNOSIS — R739 Hyperglycemia, unspecified: Secondary | ICD-10-CM | POA: Diagnosis not present

## 2020-04-05 DIAGNOSIS — K228 Other specified diseases of esophagus: Secondary | ICD-10-CM | POA: Diagnosis not present

## 2020-04-05 DIAGNOSIS — I4891 Unspecified atrial fibrillation: Secondary | ICD-10-CM | POA: Diagnosis not present

## 2020-04-05 DIAGNOSIS — K449 Diaphragmatic hernia without obstruction or gangrene: Secondary | ICD-10-CM | POA: Diagnosis not present

## 2020-04-05 DIAGNOSIS — R1319 Other dysphagia: Secondary | ICD-10-CM

## 2020-04-05 DIAGNOSIS — K921 Melena: Principal | ICD-10-CM | POA: Insufficient documentation

## 2020-04-05 HISTORY — PX: ESOPHAGOGASTRODUODENOSCOPY: SHX5428

## 2020-04-05 LAB — MAGNESIUM: Magnesium: 2.2 mg/dL (ref 1.7–2.4)

## 2020-04-05 LAB — CBC WITH DIFFERENTIAL/PLATELET
Abs Immature Granulocytes: 0.02 10*3/uL (ref 0.00–0.07)
Basophils Absolute: 0.1 10*3/uL (ref 0.0–0.1)
Basophils Relative: 1 %
Eosinophils Absolute: 0.2 10*3/uL (ref 0.0–0.5)
Eosinophils Relative: 3 %
HCT: 46.7 % — ABNORMAL HIGH (ref 36.0–46.0)
Hemoglobin: 14.7 g/dL (ref 12.0–15.0)
Immature Granulocytes: 0 %
Lymphocytes Relative: 24 %
Lymphs Abs: 1.8 10*3/uL (ref 0.7–4.0)
MCH: 31.1 pg (ref 26.0–34.0)
MCHC: 31.5 g/dL (ref 30.0–36.0)
MCV: 98.7 fL (ref 80.0–100.0)
Monocytes Absolute: 1 10*3/uL (ref 0.1–1.0)
Monocytes Relative: 13 %
Neutro Abs: 4.5 10*3/uL (ref 1.7–7.7)
Neutrophils Relative %: 59 %
Platelets: ADEQUATE 10*3/uL (ref 150–400)
RBC: 4.73 MIL/uL (ref 3.87–5.11)
RDW: 14.1 % (ref 11.5–15.5)
WBC: 7.5 10*3/uL (ref 4.0–10.5)
nRBC: 0 % (ref 0.0–0.2)

## 2020-04-05 LAB — HEMOGLOBIN A1C
Hgb A1c MFr Bld: 5.9 % — ABNORMAL HIGH (ref 4.8–5.6)
Mean Plasma Glucose: 122.63 mg/dL

## 2020-04-05 LAB — BASIC METABOLIC PANEL
Anion gap: 14 (ref 5–15)
BUN: 17 mg/dL (ref 8–23)
CO2: 29 mmol/L (ref 22–32)
Calcium: 10 mg/dL (ref 8.9–10.3)
Chloride: 95 mmol/L — ABNORMAL LOW (ref 98–111)
Creatinine, Ser: 0.99 mg/dL (ref 0.44–1.00)
GFR calc Af Amer: >60 mL/min (ref >60)
GFR calc non Af Amer: 55 mL/min — ABNORMAL LOW (ref >60)
Glucose, Bld: 121 mg/dL — ABNORMAL HIGH (ref 70–99)
Potassium: 3.1 mmol/L — ABNORMAL LOW (ref 3.5–5.1)
Sodium: 138 mmol/L (ref 135–145)

## 2020-04-05 LAB — SARS CORONAVIRUS 2 BY RT PCR (HOSPITAL ORDER, PERFORMED IN ~~LOC~~ HOSPITAL LAB): SARS Coronavirus 2: NEGATIVE

## 2020-04-05 LAB — POC OCCULT BLOOD, ED: Fecal Occult Bld: POSITIVE — AB

## 2020-04-05 SURGERY — EGD (ESOPHAGOGASTRODUODENOSCOPY)
Anesthesia: Moderate Sedation

## 2020-04-05 MED ORDER — POTASSIUM CHLORIDE 10 MEQ/100ML IV SOLN: 10.0000 meq | INTRAVENOUS | Status: AC

## 2020-04-05 MED ORDER — ONDANSETRON HCL 4 MG/2ML IJ SOLN: 4.0000 mg | Freq: Four times a day (QID) | INTRAMUSCULAR | Status: DC | PRN

## 2020-04-05 MED ORDER — TRAZODONE HCL 50 MG PO TABS: 25.0000 mg | ORAL_TABLET | Freq: Every evening | ORAL | Status: DC | PRN

## 2020-04-05 MED ORDER — SODIUM CHLORIDE 0.9 % IV SOLN
80.0000 mg | Freq: Once | INTRAVENOUS | Status: AC
  Administered 2020-04-05: 80 mg via INTRAVENOUS
  Filled 2020-04-05: qty 80

## 2020-04-05 MED ORDER — LIDOCAINE VISCOUS HCL 2 % MT SOLN
OROMUCOSAL | Status: DC | PRN
  Administered 2020-04-05: 4 mL via OROMUCOSAL

## 2020-04-05 MED ORDER — MIDAZOLAM HCL 5 MG/5ML IJ SOLN
INTRAMUSCULAR | Status: DC | PRN
  Administered 2020-04-05: 2 mg via INTRAVENOUS
  Administered 2020-04-05: 1 mg via INTRAVENOUS
  Administered 2020-04-05: 2 mg via INTRAVENOUS
  Administered 2020-04-05: 1 mg via INTRAVENOUS

## 2020-04-05 MED ORDER — ACETAMINOPHEN 650 MG RE SUPP: 650.0000 mg | Freq: Four times a day (QID) | RECTAL | Status: DC | PRN

## 2020-04-05 MED ORDER — ACETAMINOPHEN 325 MG PO TABS: 650.0000 mg | ORAL_TABLET | Freq: Four times a day (QID) | ORAL | Status: DC | PRN

## 2020-04-05 MED ORDER — SODIUM CHLORIDE 0.9 % IV SOLN
8.0000 mg/h | INTRAVENOUS | Status: DC
  Administered 2020-04-05 – 2020-04-06 (×3): 8 mg/h via INTRAVENOUS
  Filled 2020-04-05 (×6): qty 80

## 2020-04-05 MED ORDER — ONDANSETRON HCL 4 MG PO TABS: 4.0000 mg | ORAL_TABLET | Freq: Four times a day (QID) | ORAL | Status: DC | PRN

## 2020-04-05 MED ORDER — STERILE WATER FOR IRRIGATION IR SOLN
Status: DC | PRN
  Administered 2020-04-05: 1.5 mL

## 2020-04-05 MED ORDER — METOPROLOL TARTRATE 5 MG/5ML IV SOLN: 5.0000 mg | Freq: Four times a day (QID) | INTRAVENOUS | Status: DC | PRN

## 2020-04-05 MED ORDER — LIDOCAINE VISCOUS HCL 2 % MT SOLN
OROMUCOSAL | Status: AC
  Filled 2020-04-05: qty 15

## 2020-04-05 MED ORDER — MIDAZOLAM HCL 5 MG/5ML IJ SOLN
INTRAMUSCULAR | Status: AC
  Filled 2020-04-05: qty 10

## 2020-04-05 MED ORDER — MEPERIDINE HCL 50 MG/ML IJ SOLN
INTRAMUSCULAR | Status: AC
  Filled 2020-04-05: qty 1

## 2020-04-05 MED ORDER — SODIUM CHLORIDE 0.9 % IV SOLN: INTRAVENOUS | Status: DC

## 2020-04-05 MED ORDER — POTASSIUM CHLORIDE IN NACL 20-0.9 MEQ/L-% IV SOLN: INTRAVENOUS | Status: AC

## 2020-04-05 MED ORDER — MEPERIDINE HCL 50 MG/ML IJ SOLN
INTRAMUSCULAR | Status: DC | PRN
  Administered 2020-04-05 (×2): 25 mg via INTRAVENOUS

## 2020-04-05 NOTE — ED Notes (Signed)
GI PA at bedside at this time.  °

## 2020-04-05 NOTE — ED Provider Notes (Signed)
[facility] Provider Note   CSN: [id number] Arrival date & time: 04/05/20  6195     History No chief complaint on file.   Melissa Barajas is a 79 y.o. female who presents with dizziness and black stools. The patient states she was constipated last week and was having some upper abdominal pain that felt like her acid reflux. She took her acid reflux medicine without relief. She decided to take some Pepto-bismol on Friday. She still didn't have a BM so then took an OTC laxative and started to have multiple black and "flaky" stools. She thought it was from the Pepto-bismol but she continued to have black stools on Sunday as well. Her last BM was yesterday and was black at that time. Yesterday she started to feel "woozy" as well. The dizziness woke her up from sleep. She has an appointment with her PCP tomorrow but due to the dizziness and having black stools she decided to come to the ED today. She denies fever, chills, severe headache, chest pain, SOB, current abdominal pain, N/V. She had an EGD by Dr. Oneida Alar in Feb 2021 which showed a schatzki's ring which was dilated, a submucosal nodule, and a 25mm x 77mm leiomyoma. She denies NSAID or blood thinner use. She has never had a GIB before.   HPI     Past Medical History:  Diagnosis Date  . Arthritis   . Atrial fibrillation ([place])   . Collagen vascular disease ([place])   . DIVERTICULOSIS OF COLON 08/25/2010   Qualifier: Diagnosis of  By: Hoy Morn    . GERD (gastroesophageal reflux disease)   . HEMORRHOIDS, INTERNAL 08/25/2010   Qualifier: Diagnosis of  By: Hoy Morn    . Hypercholesteremia   . Hypertension   . 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 Lipoma of arm    Left    Patient Active Problem List   Diagnosis Date Noted  . 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 Gastric nodule   . BMI 30.0-30.9,adult 10/16/2019  . Rectal bleeding   . Hypercalcemia  02/01/2018  . BRBPR (bright red blood per rectum) 11/29/2017  . Cognitive dysfunction 10/30/2017  . Dysphagia, pharyngeal 05/24/2017  . Hyperglycemia 10/12/2014  . Ganglion cyst 09/26/2013  . 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 Urge incontinence 03/25/2013  . Functional constipation 11/20/2012  . Esophageal dysphagia 08/30/2010  . Hyperlipidemia 08/25/2010  . Essential hypertension 08/25/2010  . GERD 08/25/2010    Past Surgical History:  Procedure Laterality Date  . ABDOMINAL HYSTERECTOMY     Partial  . BIOPSY  10/24/2019   Procedure: BIOPSY;  Surgeon: Danie Binder, MD;  [facility];  Service: Endoscopy;;  . COLONOSCOPY  2009   SLF: 1. screening colonoscopy in 10 years 2. She should follow a high fiber diet She has been given a hand out on high fiber diverticulosois and hemorrhoids  . COLONOSCOPY N/A 02/07/2018   Procedure: COLONOSCOPY;  Surgeon: Danie Binder, MD;  [facility]: [facility];  Service: Endoscopy;  Laterality: N/A;  1:00pm  . CYSTECTOMY     Left breast  . ESOPHAGOGASTRODUODENOSCOPY     KDT:[id number] appearing Schatzki's ring, possible cervical esophageal web, both dilated with passage of a Maloney dilator/small hiatal hernia  . ESOPHAGOGASTRODUODENOSCOPY N/A 10/25/2015   Procedure: ESOPHAGOGASTRODUODENOSCOPY (EGD);  Surgeon: Danie Binder, MD;  [facility];  Service: Endoscopy;  Laterality: N/A;  1215pm  . ESOPHAGOGASTRODUODENOSCOPY N/A 10/24/2019   Schatzki's ring s/p dilation and single medium firm, probable submucosal nodule without bleeding. Medium sized hiatal hernia, normal duodenum.   . EUS  11/2019   Dr. Paulita Fujita: EUS completed March 2021 with likely leiomyoma, no other concerns. 10 mm by 10 mm.   Marland Kitchen POLYPECTOMY  02/07/2018   Procedure: POLYPECTOMY;  Surgeon: Danie Binder, MD;  [facility];  Service: Endoscopy;;  ascending colon, descending, sigmoid  . SAVORY DILATION N/A 10/25/2015   Procedure:  SAVORY DILATION;  Surgeon: Danie Binder, MD;  [facility];  Service: Endoscopy;  Laterality: N/A;  . SAVORY DILATION N/A 10/24/2019   Procedure: SAVORY DILATION;  Surgeon: Danie Binder, MD;  [facility];  Service: Endoscopy;  Laterality: N/A;  . THYROIDECTOMY    . Tumor   05/2010   Left breast under nipple  . VESICOVAGINAL FISTULA CLOSURE W/ TAH     fibroid tumors     OB History    Gravida  6   Para      Term      Preterm      AB  2   Living        SAB      TAB      Ectopic      Multiple      Live Births              Family History  Problem Relation Age of Onset  . Cancer Brother        Prostate    Social History   Tobacco Use  . Smoking status: Former Smoker    Packs/day: 1.00    Years: 15.00    Pack years: 15.00    Types: Cigarettes    Start date: 09/12/1983    Quit date: 09/11/1993    Years since quitting: 26.5  . Smokeless tobacco: Never Used  Vaping Use  . Vaping Use: Never used  Substance Use Topics  . Alcohol use: No    Alcohol/week: 0.0 standard drinks  . Drug use: No    Home Medications Prior to Admission medications   Medication Sig Start Date End Date Taking? Authorizing Provider  acetaminophen (TYLENOL) 500 MG tablet Take 1,000 mg by mouth 2 (two) times daily as needed for moderate pain or headache.    [provider]  albuterol (VENTOLIN HFA) 108 (90 Base) MCG/ACT inhaler Inhale 2 puffs into the lungs every 4 (four) hours as needed for wheezing or shortness of breath. 01/21/19   Kathyrn Drown, MD  Ascorbic Acid (VITAMIN C) 1000 MG tablet Take 1,000 mg by mouth daily.    [provider]  BLACK CURRANT SEED OIL PO Take 5 mLs by mouth daily.    [provider]  chlorthalidone (HYGROTON) 25 MG tablet Take 1 tablet (25 mg total) by mouth daily. 03/24/20   Kathyrn Drown, MD  Cholecalciferol (VITAMIN D3) 50 MCG (2000 UT) TABS Take 2,000 Units by mouth daily.    [provider]   Coenzyme Q10-Vitamin E (QUNOL ULTRA COQ10 PO) Take 5 mLs by mouth daily.    [provider]  diltiazem (CARDIZEM CD) 360 MG 24 hr capsule Take 1 capsule (360 mg total) by mouth daily. 02/24/20   Kathyrn Drown, MD  famotidine (PEPCID) 40 MG tablet Take 1 tablet (40 mg total) by mouth daily. 03/24/20   Kathyrn Drown, MD  fluticasone furoate-vilanterol (BREO ELLIPTA) 200-25  MCG/INH AEPB INHALE 1 PUFF INTO THE LUNGS DAILY 03/24/20   Kathyrn Drown, MD  milk thistle 175 MG tablet Take 175 mg by mouth daily.    [provider]  Polyethyl Glycol-Propyl Glycol (SYSTANE OP) Place 2 drops into both eyes 2 (two) times daily.     [provider]  potassium chloride (KLOR-CON) 10 MEQ tablet TAKE 1 TABLET BY MOUTH  TWICE DAILY 01/05/20   Kathyrn Drown, MD  Pumpkin Seed-Soy Germ (AZO BLADDER CONTROL/GO-LESS PO) Take 1 tablet by mouth 2 (two) times daily.    [provider]  rosuvastatin (CRESTOR) 20 MG tablet Take 1 tablet (20 mg total) by mouth daily. 02/24/20   Kathyrn Drown, MD  Specialty Vitamins Products (BRAIN PO) Take 1 capsule by mouth daily. FOREBRAIN COGNITIVE PERFORMANCE SUPPLEMENT    [provider]  Turmeric Curcumin 500 MG CAPS Take 500 mg by mouth daily. W/Ginger Powder    [provider]    Allergies    Aricept [donepezil hcl], Hydrocodone, Lisinopril, Naprosyn [naproxen], and Vioxx [rofecoxib]  Review of Systems   Review of Systems  Constitutional: Negative for chills and fever.  Respiratory: Negative for shortness of breath.   Cardiovascular: Negative for chest pain.  Gastrointestinal: Positive for abdominal pain (resolved) and blood in stool (black). Negative for constipation, diarrhea, nausea and vomiting.  Neurological: Positive for dizziness. Negative for syncope and light-headedness.  All other systems reviewed and are negative.   Physical Exam Updated Vital Signs BP (!) 173/79 (BP Location: Right Arm)   Pulse 65   Temp  98 F (36.7 C) (Oral)   Resp 18   Ht 5' 6.5" (1.689 m)   Wt (!) 93.4 kg   SpO2 100%   BMI 32.75 kg/m   Physical Exam Vitals and nursing note reviewed.  Constitutional:      General: She is not in acute distress.    Appearance: She is well-developed. She is obese. She is not ill-appearing.  HENT:     Head: Normocephalic and atraumatic.  Eyes:     General: No scleral icterus.       Right eye: No discharge.        Left eye: No discharge.     Conjunctiva/sclera: Conjunctivae normal.     Pupils: Pupils are equal, round, and reactive to light.  Cardiovascular:     Rate and Rhythm: Normal rate and regular rhythm.  Pulmonary:     Effort: Pulmonary effort is normal. No respiratory distress.     Breath sounds: Normal breath sounds.  Abdominal:     General: There is no distension.     Palpations: Abdomen is soft.     Tenderness: There is abdominal tenderness (minimal epigastric tenderness).  Genitourinary:    Comments: Rectal: Large non-thrombosed, non-bleeding hemorrhoid. There is a small amount of melena with digital exam. No tenderness. Musculoskeletal:     Cervical back: Normal range of motion.  Skin:    General: Skin is warm and dry.  Neurological:     Mental Status: She is alert and oriented to person, place, and time.  Psychiatric:        Behavior: Behavior normal.     ED Results / Procedures / Treatments   Labs (all labs ordered are listed, but only abnormal results are displayed) Labs Reviewed  BASIC METABOLIC PANEL - Abnormal; Notable for the following components:      Result Value   Potassium 3.1 (*)    Chloride 95 (*)  Glucose, Bld 121 (*)    GFR calc non Af Amer 55 (*)    All other components within normal limits  CBC WITH DIFFERENTIAL/PLATELET - Abnormal; Notable for the following components:   HCT 46.7 (*)    All other components within normal limits  POC OCCULT BLOOD, ED - Abnormal; Notable for the following components:   Fecal Occult Bld POSITIVE (*)     All other components within normal limits  SARS CORONAVIRUS 2 BY RT PCR (HOSPITAL ORDER, [facility])  MAGNESIUM  HEMOGLOBIN A1C  CBC  MAGNESIUM  COMPREHENSIVE METABOLIC PANEL    EKG None  Radiology No results found.  Procedures Procedures (including critical care time)  Medications Ordered in ED Medications  pantoprazole (PROTONIX) 80 mg in sodium chloride 0.9 % 100 mL (0.8 mg/mL) infusion (0 mg/hr Intravenous Stopped 04/05/20 1515)  0.9 % NaCl with KCl 20 mEq/ L  infusion (has no administration in time range)  acetaminophen (TYLENOL) tablet 650 mg (has no administration in time range)    Or  acetaminophen (TYLENOL) suppository 650 mg (has no administration in time range)  traZODone (DESYREL) tablet 25 mg (has no administration in time range)  ondansetron (ZOFRAN) tablet 4 mg (has no administration in time range)    Or  ondansetron (ZOFRAN) injection 4 mg (has no administration in time range)  metoprolol tartrate (LOPRESSOR) injection 5 mg (has no administration in time range)  potassium chloride 10 mEq in 100 mL IVPB ( Intravenous MAR Unhold 04/05/20 1536)  midazolam (VERSED) 5 MG/5ML injection (has no administration in time range)  meperidine (DEMEROL) 50 MG/ML injection (has no administration in time range)  lidocaine (XYLOCAINE) 2 % viscous mouth solution (has no administration in time range)  pantoprazole (PROTONIX) 80 mg in sodium chloride 0.9 % 100 mL IVPB (0 mg Intravenous Stopped 04/05/20 1355)    ED Course  I have reviewed the triage vital signs and the nursing notes.  Pertinent labs & imaging results that were available during my care of the patient were reviewed by me and considered in my medical decision making (see chart for details).  79 year old female presents with black stools and dizziness over the past couple days. BP is elevated but otherwise vitals are reassuring. Abdomen is soft and non-tender. Rectal exam performed does  shows black stool. She is hemodynamically stable. Hgb is 14.7. She also has mild hypokalemia (3.1). Orthostatics were obtained and are negative. Will consult GI  Discussed with Dr. Laural Golden - he recommends pt is NPO and will see. Recommends hospitalist admission and protonix drip  Discussed with Dr. Wynetta Emery with [facility] who will admit.  MDM Rules/Calculators/A&P                           Final Clinical Impression(s) / ED Diagnoses Final diagnoses:  Upper GI bleed    Rx / DC Orders ED Discharge Orders    None       Recardo Evangelist, PA-C 04/05/20 1743    Davonna Belling, MD 04/06/20 [phone]

## 2020-04-05 NOTE — ED Triage Notes (Signed)
On Friday night pt states stomach started burning.  She took an acid reflux pill with no relief then took pepto bismol with no relief, then took a laxitive and noticed stools were black in color.

## 2020-04-05 NOTE — Op Note (Addendum)
[facility] Patient Name: Melissa Barajas Procedure Date: 04/05/2020 1:48 PM MRN: [id number] Date of Birth: 1941-06-09 Attending MD: Hildred Laser , MD CSN: [id number] Age: 79 Admit Type: Outpatient Procedure:                Upper GI endoscopy Indications:              Melena Providers:                Hildred Laser, MD, Crystal Page, Aram Candela Referring MD:             Irwin Brakeman, MD Medicines:                Lidocaine spray, Meperidine 50 mg IV, Midazolam 6                            mg IV Complications:            No immediate complications. Estimated Blood Loss:     Estimated blood loss: none. Procedure:                Pre-Anesthesia Assessment:                           - Prior to the procedure, a History and Physical                            was performed, and patient medications and                            allergies were reviewed. The patient's tolerance of                            previous anesthesia was also reviewed. The risks                            and benefits of the procedure and the sedation                            options and risks were discussed with the patient.                            All questions were answered, and informed consent                            was obtained. Prior Anticoagulants: The patient has                            taken no previous anticoagulant or antiplatelet                            agents except for NSAID medication. ASA Grade                            Assessment: II - A patient with mild systemic  disease. After reviewing the risks and benefits,                            the patient was deemed in satisfactory condition to                            undergo the procedure.                           After obtaining informed consent, the endoscope was                            passed under direct vision. Throughout the                            procedure, the patient's blood pressure,  pulse, and                            oxygen saturations were monitored continuously. The                            [id number]-[id number] ([id number]) was introduced through the                            mouth, and advanced to the second part of duodenum.                            The upper GI endoscopy was accomplished without                            difficulty. The patient tolerated the procedure                            well. Scope In: 2:26:55 PM Scope Out: 2:32:39 PM Total Procedure Duration: 0 hours 5 minutes 44 seconds  Findings:      The hypopharynx was normal.      The examined esophagus was normal.      The Z-line was irregular and was found 35 cm from the incisors.      A 3 cm hiatal hernia was present.      A medium-sized, submucosal mass with no bleeding and no stigmata of       recent bleeding was found in the gastric fundus.      The exam of the stomach was otherwise normal.      The duodenal bulb and second portion of the duodenum were normal. Impression:               - Normal hypopharynx.                           - Normal esophagus.                           - Z-line irregular, 35 cm from the incisors. Focal  erythema at GEJ.                           - 3 cm hiatal hernia.                           - Benign gastric tumor in the gastric fundus                            previously confirmed to be leiomyoma(EUS 11/2019).                           - Normal duodenal bulb and second portion of the                            duodenum.                           - No specimens collected. Moderate Sedation:      Moderate (conscious) sedation was administered by the endoscopy nurse       and supervised by the endoscopist. The following parameters were       monitored: oxygen saturation, heart rate, blood pressure, CO2       capnography and response to care. Total physician intraservice time was       10 minutes. Recommendation:           - Return patient  to hospital ward for ongoing care.                           - Clear liquid diet today.                           - To visualize the small bowel, perform video                            capsule endoscopy tomorrow. Procedure Code(s):        --- Professional ---                           [phone], Esophagogastroduodenoscopy, flexible,                            transoral; diagnostic, including collection of                            specimen(s) by brushing or washing, when performed                            (separate procedure)                           G0500, Moderate sedation services provided by the                            same physician or other qualified health care  professional performing a gastrointestinal                            endoscopic service that sedation supports,                            requiring the presence of an independent trained                            observer to assist in the monitoring of the                            patient's level of consciousness and physiological                            status; initial 15 minutes of intra-service time;                            patient age 45 years or older (additional time may                            be reported with [phone], as appropriate) Diagnosis Code(s):        --- Professional ---                           K22.8, Other specified diseases of esophagus                           K44.9, Diaphragmatic hernia without obstruction or                            gangrene                           D13.1, Benign neoplasm of stomach                           K92.1, Melena (includes Hematochezia) CPT copyright 2019 [employer]. All rights reserved. The codes documented in this report are preliminary and upon coder review may  be revised to meet current compliance requirements. Hildred Laser, MD Hildred Laser, MD 04/05/2020 2:54:02 PM This report has been signed  electronically. Number of Addenda: 0

## 2020-04-05 NOTE — Consult Note (Signed)
Referring Provider: ED physician Primary Care Physician:  Kathyrn Drown, MD Primary Gastroenterologist:  Dr. Oneida Alar (pending Dr. Abbey Chatters)  Date of Admission: 04/05/20 Date of Consultation: 04/05/20  Reason for Consultation:  Melena, heme+ stool  HPI:  Melissa Barajas is a 79 y.o. female with a past medical history of arthritis, atrial fibrillation not anticoagulated, GERD, hypercholesterolemia, hypertension, leiomyoma of the stomach.  Patient is known to our practice and was last seen in our office 02/24/2020 for GERD and leiomyoma of the stomach.  Status post EGD in February 2021 with Schatzki's ring status post dilation and single medium firm probable submucosal nodule without bleeding.  Follow-up EUS in March 2021 with likely leiomyoma, no other concerns measuring 10 mm x 10 mm.  At her visit still with left upper quadrant pain since August 2020 it is intermittent, try to avoid triggers.  Noted esophageal burning recently.  Takes Protonix twice daily chronically.  Recommended trial of Dexilant, EUS surveillance as to be determined, follow-up in 6 months.  She presented to the emergency department today with complaints of dizziness and black stools.  Noted upper abdominal burning pain that felt like GERD flare last week and Pepcid did not provide relief (does not appear to be on PPI currently).  Took Pepto-Bismol on Friday.  Was constipated so she took a laxative over-the-counter and noted to have multiple black stools that she thought was from Pepto-Bismol but she continued with black stools on Sunday and yesterday as well.  Denied fever, chest pain, dyspnea, nausea, vomiting.  No NSAIDs or blood thinners.  Never had a GI bleed previously.  In the emergency department BMP with mild hypokalemia at 3.1, otherwise unremarkable; BUN normal.  Thankfully hemoglobin stable at 14.7.  Heme stool test positive for blood.  SARS-CoV-2 in process.  Today she states She doing ok overall. Started having worsening  abdominal pain Friday after eating a sub. Pain got progressively worse so she took her normal Pepcid without improvement; she then took a Protonix. Finally, she took [employer]. Saturday she had a black stools. This continued through Sunday. No bowel movement today (though feels she may need to soon). Was on Protonix bid and changed to Day. When she saw her PCP she was switched to Pepcid due to 'side effects and wanting something once a day." Took an Aleve sometime last week (rarely takes this otherwise, typical 3 times a year). No other NSAIDs or ASA powders. Denies N/V, hematochezia, fever, unintentional weight loss. No other GI complaints.  Last oral intake yesterday evening around 8:00 pm. No anticoagulants or antiplatelets.  Past Medical History:  Diagnosis Date  . Arthritis   . Atrial fibrillation ([place])   . Collagen vascular disease ([place])   . DIVERTICULOSIS OF COLON 08/25/2010   Qualifier: Diagnosis of  By: Hoy Morn    . GERD (gastroesophageal reflux disease)   . HEMORRHOIDS, INTERNAL 08/25/2010   Qualifier: Diagnosis of  By: Hoy Morn    . Hypercholesteremia   . Hypertension   . 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 Lipoma of arm    Left    Past Surgical History:  Procedure Laterality Date  . ABDOMINAL HYSTERECTOMY     Partial  . BIOPSY  10/24/2019   Procedure: BIOPSY;  Surgeon: Danie Binder, MD;  [facility];  Service: Endoscopy;;  . COLONOSCOPY  2009   SLF: 1. screening colonoscopy in 10 years 2. She  should follow a high fiber diet She has been given a hand out on high fiber diverticulosois and hemorrhoids  . COLONOSCOPY N/A 02/07/2018   Procedure: COLONOSCOPY;  Surgeon: Danie Binder, MD;  [facility];  Service: Endoscopy;  Laterality: N/A;  1:00pm  . CYSTECTOMY     Left breast  . ESOPHAGOGASTRODUODENOSCOPY     BCW:[id number] appearing Schatzki's ring, possible  cervical esophageal web, both dilated with passage of a Maloney dilator/small hiatal hernia  . ESOPHAGOGASTRODUODENOSCOPY N/A 10/25/2015   Procedure: ESOPHAGOGASTRODUODENOSCOPY (EGD);  Surgeon: Danie Binder, MD;  [facility];  Service: Endoscopy;  Laterality: N/A;  1215pm  . ESOPHAGOGASTRODUODENOSCOPY N/A 10/24/2019   Schatzki's ring s/p dilation and single medium firm, probable submucosal nodule without bleeding. Medium sized hiatal hernia, normal duodenum.   . EUS  11/2019   Dr. Paulita Fujita: EUS completed March 2021 with likely leiomyoma, no other concerns. 10 mm by 10 mm.   Marland Kitchen POLYPECTOMY  02/07/2018   Procedure: POLYPECTOMY;  Surgeon: Danie Binder, MD;  [facility];  Service: Endoscopy;;  ascending colon, descending, sigmoid  . SAVORY DILATION N/A 10/25/2015   Procedure: SAVORY DILATION;  Surgeon: Danie Binder, MD;  [facility];  Service: Endoscopy;  Laterality: N/A;  . SAVORY DILATION N/A 10/24/2019   Procedure: SAVORY DILATION;  Surgeon: Danie Binder, MD;  [facility];  Service: Endoscopy;  Laterality: N/A;  . THYROIDECTOMY    . Tumor   05/2010   Left breast under nipple  . VESICOVAGINAL FISTULA CLOSURE W/ TAH     fibroid tumors    Prior to Admission medications   Medication Sig Start Date End Date Taking? Authorizing Provider  acetaminophen (TYLENOL) 500 MG tablet Take 1,000 mg by mouth 2 (two) times daily as needed for moderate pain or headache.    [provider]  albuterol (VENTOLIN HFA) 108 (90 Base) MCG/ACT inhaler Inhale 2 puffs into the lungs every 4 (four) hours as needed for wheezing or shortness of breath. 01/21/19   Kathyrn Drown, MD  Ascorbic Acid (VITAMIN C) 1000 MG tablet Take 1,000 mg by mouth daily.    [provider]  BLACK CURRANT SEED OIL PO Take 5 mLs by mouth daily.    [provider]  chlorthalidone (HYGROTON) 25 MG tablet Take 1 tablet (25 mg total) by mouth daily. 03/24/20   Kathyrn Drown, MD  Cholecalciferol (VITAMIN D3) 50 MCG (2000 UT) TABS Take 2,000 Units by mouth daily.    [provider]  Coenzyme Q10-Vitamin E (QUNOL ULTRA COQ10 PO) Take 5 mLs by mouth daily.    [provider]  diltiazem (CARDIZEM CD) 360 MG 24 hr capsule Take 1 capsule (360 mg total) by mouth daily. 02/24/20   Kathyrn Drown, MD  famotidine (PEPCID) 40 MG tablet Take 1 tablet (40 mg total) by mouth daily. 03/24/20   Kathyrn Drown, MD  fluticasone furoate-vilanterol (BREO ELLIPTA) 200-25 MCG/INH AEPB INHALE 1 PUFF INTO THE LUNGS DAILY 03/24/20   Kathyrn Drown, MD  milk thistle 175 MG tablet Take 175 mg by mouth daily.    [provider]  Polyethyl Glycol-Propyl Glycol (SYSTANE OP) Place 2 drops into both eyes 2 (two) times daily.     [provider]  potassium chloride (KLOR-CON) 10 MEQ tablet TAKE 1 TABLET BY MOUTH  TWICE DAILY 01/05/20   Luking, Elayne Snare, MD  Pumpkin Seed-Soy Germ (AZO BLADDER CONTROL/GO-LESS PO) Take  1 tablet by mouth 2 (two) times daily.    [provider]  rosuvastatin (CRESTOR) 20 MG tablet Take 1 tablet (20 mg total) by mouth daily. 02/24/20   Kathyrn Drown, MD  Specialty Vitamins Products (BRAIN PO) Take 1 capsule by mouth daily. FOREBRAIN COGNITIVE PERFORMANCE SUPPLEMENT    [provider]  Turmeric Curcumin 500 MG CAPS Take 500 mg by mouth daily. W/Ginger Powder    [provider]    Current Facility-Administered Medications  Medication Dose Route Frequency Provider Last Rate Last Admin  . pantoprazole (PROTONIX) 80 mg in sodium chloride 0.9 % 100 mL (0.8 mg/mL) infusion  8 mg/hr Intravenous Continuous Recardo Evangelist, PA-C      . pantoprazole (PROTONIX) 80 mg in sodium chloride 0.9 % 100 mL IVPB  80 mg Intravenous Once Recardo Evangelist, PA-C       Current Outpatient Medications  Medication Sig Dispense Refill  . acetaminophen (TYLENOL) 500 MG tablet Take 1,000 mg by mouth 2 (two) times daily as needed for  moderate pain or headache.    . albuterol (VENTOLIN HFA) 108 (90 Base) MCG/ACT inhaler Inhale 2 puffs into the lungs every 4 (four) hours as needed for wheezing or shortness of breath. 1 Inhaler 0  . Ascorbic Acid (VITAMIN C) 1000 MG tablet Take 1,000 mg by mouth daily.    Marland Kitchen BLACK CURRANT SEED OIL PO Take 5 mLs by mouth daily.    . chlorthalidone (HYGROTON) 25 MG tablet Take 1 tablet (25 mg total) by mouth daily. 90 tablet 1  . Cholecalciferol (VITAMIN D3) 50 MCG (2000 UT) TABS Take 2,000 Units by mouth daily.    . Coenzyme Q10-Vitamin E (QUNOL ULTRA COQ10 PO) Take 5 mLs by mouth daily.    Marland Kitchen diltiazem (CARDIZEM CD) 360 MG 24 hr capsule Take 1 capsule (360 mg total) by mouth daily. 90 capsule 1  . famotidine (PEPCID) 40 MG tablet Take 1 tablet (40 mg total) by mouth daily. 90 tablet 1  . fluticasone furoate-vilanterol (BREO ELLIPTA) 200-25 MCG/INH AEPB INHALE 1 PUFF INTO THE LUNGS DAILY 180 each 2  . milk thistle 175 MG tablet Take 175 mg by mouth daily.    Vladimir Faster Glycol-Propyl Glycol (SYSTANE OP) Place 2 drops into both eyes 2 (two) times daily.     . potassium chloride (KLOR-CON) 10 MEQ tablet TAKE 1 TABLET BY MOUTH  TWICE DAILY 180 tablet 0  . Pumpkin Seed-Soy Germ (AZO BLADDER CONTROL/GO-LESS PO) Take 1 tablet by mouth 2 (two) times daily.    . rosuvastatin (CRESTOR) 20 MG tablet Take 1 tablet (20 mg total) by mouth daily. 90 tablet 1  . Specialty Vitamins Products (BRAIN PO) Take 1 capsule by mouth daily. FOREBRAIN COGNITIVE PERFORMANCE SUPPLEMENT    . Turmeric Curcumin 500 MG CAPS Take 500 mg by mouth daily. W/Ginger Powder      Allergies as of 04/05/2020 - Review Complete 04/05/2020  Allergen Reaction Noted  . Aricept [donepezil hcl]  11/20/2017  . Hydrocodone Nausea Only 01/08/2013  . Lisinopril  01/08/2013  . Naprosyn [naproxen]  04/06/2016  . Vioxx [rofecoxib]  01/08/2013    Family History  Problem Relation Age of Onset  . Cancer Brother        Prostate    Social  History   Socioeconomic History  . Marital status: Married    Spouse name: Not on file  . Number of children: Not on file  . Years of education: Not on file  .  Highest education level: Not on file  Occupational History  . Not on file  Tobacco Use  . Smoking status: Former Smoker    Packs/day: 1.00    Years: 15.00    Pack years: 15.00    Types: Cigarettes    Start date: 09/12/1983    Quit date: 09/11/1993    Years since quitting: 26.5  . Smokeless tobacco: Never Used  Vaping Use  . Vaping Use: Never used  Substance and Sexual Activity  . Alcohol use: No    Alcohol/week: 0.0 standard drinks  . Drug use: No  . Sexual activity: Not on file  Other Topics Concern  . Not on file  Social History Narrative  . Not on file   Social Determinants of Health   Financial Resource Strain:   . Difficulty of Paying Living Expenses:   Food Insecurity:   . Worried About Charity fundraiser in the Last Year:   . Arboriculturist in the Last Year:   Transportation Needs:   . Film/video editor (Medical):   Marland Kitchen Lack of Transportation (Non-Medical):   Physical Activity:   . Days of Exercise per Week:   . Minutes of Exercise per Session:   Stress:   . Feeling of Stress :   Social Connections:   . Frequency of Communication with Friends and Family:   . Frequency of Social Gatherings with Friends and Family:   . Attends Religious Services:   . Active Member of Clubs or Organizations:   . Attends Archivist Meetings:   Marland Kitchen Marital Status:   Intimate Partner Violence:   . Fear of Current or Ex-Partner:   . Emotionally Abused:   Marland Kitchen Physically Abused:   . Sexually Abused:     Review of Systems: General: Negative for anorexia, weight loss, fever, chills, fatigue, weakness. ENT: Negative for hoarseness, difficulty swallowing. CV: Negative for chest pain, angina, palpitations, peripheral edema.  Respiratory: Negative for dyspnea at rest, cough, sputum, wheezing.  GI: See history of  present illness. Endo: Negative for unusual weight change.  Heme: Negative for bruising or bleeding. Allergy: Negative for rash or hives.  Physical Exam: Vital signs in last 24 hours: Temp:  [98 F (36.7 C)] 98 F (36.7 C) (07/26 0926) Pulse Rate:  [61-72] 72 (07/26 1137) Resp:  [18] 18 (07/26 0926) BP: (166-182)/(74-86) 166/86 (07/26 1137) SpO2:  [95 %-100 %] 98 % (07/26 1137) Weight:  [93.4 kg] 93.4 kg (07/26 0927)   General:   Alert,  Well-developed, well-nourished, pleasant and cooperative in NAD Eyes:  Sclera clear, no icterus. Conjunctiva pink. Ears:  Normal auditory acuity. Neck:  Supple; no masses or thyromegaly. Lungs:  Clear throughout to auscultation. No wheezes, crackles, or rhonchi. No acute distress. Heart:  Regular rate and rhythm; no murmurs, clicks, rubs, or gallops. Abdomen:  Soft, nontender and nondistended. No masses, hepatosplenomegaly or hernias noted. Normal bowel sounds, without guarding, and without rebound.   Rectal:  Deferred.   Msk:  Symmetrical without gross deformities. Pulses:  Normal bilateral DP pulses noted. Extremities:  Without clubbing or edema. Neurologic:  Alert and  oriented x4;  grossly normal neurologically. Psych:  Alert and cooperative. Normal mood and affect.  Intake/Output from previous day: No intake/output data recorded. Intake/Output this shift: No intake/output data recorded.  Lab Results: Recent Labs    04/05/20 0947  WBC 7.5  HGB 14.7  HCT 46.7*  PLT PLATELET CLUMPS NOTED ON SMEAR, COUNT APPEARS ADEQUATE   BMET  Recent Labs    04/05/20 0947  NA 138  K 3.1*  CL 95*  CO2 29  GLUCOSE 121*  BUN 17  CREATININE 0.99  CALCIUM 10.0   LFT No results for input(s): PROT, ALBUMIN, AST, ALT, ALKPHOS, BILITOT, BILIDIR, IBILI in the last 72 hours. PT/INR No results for input(s): LABPROT, INR in the last 72 hours. Hepatitis Panel No results for input(s): HEPBSAG, HCVAB, HEPAIGM, HEPBIGM in the last 72 hours. C-Diff No  results for input(s): CDIFFTOX in the last 72 hours.  Studies/Results: No results found.  Impression: Very pleasant 79 year old female with a history of intermittent GERD who was switched off of her PPI to an H2 receptor blocker recently.  She generally does well avoiding triggers and NSAIDs (although she did take an Aleve sometime last week).  She began having worsening upper abdominal pain for which her Pepcid did not help, took her Protonix as well which did not help, took Pepto-Bismol.  Since then she has had 2 to 3 days of "pitch black stools".  Denies symptoms of significant anemia.  Hemoglobin stable.  Recent EGD with gastric leiomyoma with no further recommendations, awaiting recommendations for repeat EUS.  Her stool was positive for blood in the ED.  Overall differentials could include esophagitis, gastritis, duodenitis, gastric erosion, peptic ulcer disease, small bowel erosions, AVMs.  Less likely malignant process.  Her last oral intake was yesterday evening.  She is on any anticoagulants.  I discussed with her the potential for EGD today and she is agreeable to proceed.  Plan: 1. Continue PPI drip until results of endoscopy no 2. Continue n.p.o. for now 3. Monitor hemoglobin closely 4. Transfuse as needed 5. Plan for EGD shortly 6. Supportive measures  Proceed with EGD with Dr. Laural Golden on propofol/MAC today: the risks, benefits, and alternatives have been discussed with the patient in detail. The patient states understanding and desires to proceed.   Thank you for allowing [place] to participate in the care of Galen Daft, DNP, AGNP-C Adult & Gerontological Nurse Practitioner [facility]   LOS: 0 days     04/05/2020, 12:40 PM

## 2020-04-05 NOTE — H&P (Signed)
History and Physical  [facility]  Melissa Barajas AJO:[id number] DOB: [birth year] DOA: 04/05/2020  PCP: Kathyrn Drown, MD  Patient coming from: Home   I have personally briefly reviewed patient's old medical records in [facility]  Chief Complaint: black stools  HPI: Melissa Barajas is a 79 y.o. female with medical history significant for chronic atrial fibrillation not anticoagulated, severe osteoarthritis and collagen vascular disease, diverticulosis of the colon, GERD, internal hemorrhoids, hypercholesterolemia, difficult to control hypertension reports that she started having stomach upset burning in the stomach three nights ago and took an acid reflux pill with no relief followed by Pepto-Bismol with no relief.  She eventually took a laxative and noticed that she was having jet black stools.  She also reports that she has been having dizziness.  She says that she takes etodolac for osteoarthritis.  She has been taking it for quite some time.  Her PCP has been trying to get her off of it however she says that she restarted it recently when she started having a flare of her osteoarthritis.  She says that she also recently started a new diuretic for blood pressure was recently started by her PCP on 03/24/2020.  ED Course: Temperature 98.0, pulse 65, BP 173/79, pulse ox 100% on room air.  Sodium 138, potassium 3.1, chloride 95, CO2 29, glucose 121, creatinine 0.99, calcium 10.0, GFR greater than 60, hemoglobin 14.7, hematocrit 46.7.  SARS two coronavirus test negative.  Fecal Hemoccult test positive.  Review of Systems: As per HPI otherwise 10 point review of systems negative.    Past Medical History:  Diagnosis Date  . Arthritis   . Atrial fibrillation ([place])   . Collagen vascular disease ([place])   . DIVERTICULOSIS OF COLON 08/25/2010   Qualifier: Diagnosis of  By: Hoy Morn    . GERD (gastroesophageal reflux disease)   . HEMORRHOIDS, INTERNAL 08/25/2010   Qualifier: Diagnosis  of  By: Hoy Morn    . Hypercholesteremia   . Hypertension   . 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 Lipoma of arm    Left    Past Surgical History:  Procedure Laterality Date  . ABDOMINAL HYSTERECTOMY     Partial  . BIOPSY  10/24/2019   Procedure: BIOPSY;  Surgeon: Danie Binder, MD;  [facility];  Service: Endoscopy;;  . COLONOSCOPY  2009   SLF: 1. screening colonoscopy in 10 years 2. She should follow a high fiber diet She has been given a hand out on high fiber diverticulosois and hemorrhoids  . COLONOSCOPY N/A 02/07/2018   Procedure: COLONOSCOPY;  Surgeon: Danie Binder, MD;  [facility];  Service: Endoscopy;  Laterality: N/A;  1:00pm  . CYSTECTOMY     Left breast  . ESOPHAGOGASTRODUODENOSCOPY     NOB:[id number] appearing Schatzki's ring, possible cervical esophageal web, both dilated with passage of a Maloney dilator/small hiatal hernia  . ESOPHAGOGASTRODUODENOSCOPY N/A 10/25/2015   Procedure: ESOPHAGOGASTRODUODENOSCOPY (EGD);  Surgeon: Danie Binder, MD;  [facility];  Service: Endoscopy;  Laterality: N/A;  1215pm  . ESOPHAGOGASTRODUODENOSCOPY N/A 10/24/2019   Schatzki's ring s/p dilation and single medium firm, probable submucosal nodule without bleeding. Medium sized hiatal hernia, normal duodenum.   . EUS  11/2019   Dr. Paulita Fujita: EUS completed March 2021 with likely leiomyoma, no other concerns. 10 mm by 10 mm.   Marland Kitchen POLYPECTOMY  02/07/2018  Procedure: POLYPECTOMY;  Surgeon: Danie Binder, MD;  [facility];  Service: Endoscopy;;  ascending colon, descending, sigmoid  . SAVORY DILATION N/A 10/25/2015   Procedure: SAVORY DILATION;  Surgeon: Danie Binder, MD;  [facility];  Service: Endoscopy;  Laterality: N/A;  . SAVORY DILATION N/A 10/24/2019   Procedure: SAVORY DILATION;  Surgeon: Danie Binder, MD;  [facility];  Service: Endoscopy;   Laterality: N/A;  . THYROIDECTOMY    . Tumor   05/2010   Left breast under nipple  . VESICOVAGINAL FISTULA CLOSURE W/ TAH     fibroid tumors     reports that she quit smoking about 26 years ago. Her smoking use included cigarettes. She started smoking about 36 years ago. She has a 15.00 pack-year smoking history. She has never used smokeless tobacco. She reports that she does not drink alcohol and does not use drugs.  Allergies  Allergen Reactions  . Aricept [Donepezil Hcl]     Sleep deprived  . Hydrocodone Nausea Only  . Lisinopril     Angio edema  . Naprosyn [Naproxen]     nausea  . Vioxx [Rofecoxib]     Unknown reaction     Family History  Problem Relation Age of Onset  . Cancer Brother        Prostate     Prior to Admission medications   Medication Sig Start Date End Date Taking? Authorizing Provider  acetaminophen (TYLENOL) 500 MG tablet Take 1,000 mg by mouth 2 (two) times daily as needed for moderate pain or headache.    [provider]  albuterol (VENTOLIN HFA) 108 (90 Base) MCG/ACT inhaler Inhale 2 puffs into the lungs every 4 (four) hours as needed for wheezing or shortness of breath. 01/21/19   Kathyrn Drown, MD  Ascorbic Acid (VITAMIN C) 1000 MG tablet Take 1,000 mg by mouth daily.    [provider]  BLACK CURRANT SEED OIL PO Take 5 mLs by mouth daily.    [provider]  chlorthalidone (HYGROTON) 25 MG tablet Take 1 tablet (25 mg total) by mouth daily. 03/24/20   Kathyrn Drown, MD  Cholecalciferol (VITAMIN D3) 50 MCG (2000 UT) TABS Take 2,000 Units by mouth daily.    [provider]  Coenzyme Q10-Vitamin E (QUNOL ULTRA COQ10 PO) Take 5 mLs by mouth daily.    [provider]  diltiazem (CARDIZEM CD) 360 MG 24 hr capsule Take 1 capsule (360 mg total) by mouth daily. 02/24/20   Kathyrn Drown, MD  famotidine (PEPCID) 40 MG tablet Take 1 tablet (40 mg total) by mouth daily. 03/24/20   Kathyrn Drown, MD  fluticasone  furoate-vilanterol (BREO ELLIPTA) 200-25 MCG/INH AEPB INHALE 1 PUFF INTO THE LUNGS DAILY 03/24/20   Kathyrn Drown, MD  milk thistle 175 MG tablet Take 175 mg by mouth daily.    [provider]  Polyethyl Glycol-Propyl Glycol (SYSTANE OP) Place 2 drops into both eyes 2 (two) times daily.     [provider]  potassium chloride (KLOR-CON) 10 MEQ tablet TAKE 1 TABLET BY MOUTH  TWICE DAILY 01/05/20   Kathyrn Drown, MD  Pumpkin Seed-Soy Germ (AZO BLADDER CONTROL/GO-LESS PO) Take 1 tablet by mouth 2 (two) times daily.    [provider]  rosuvastatin (CRESTOR) 20 MG tablet Take 1 tablet (20 mg total) by mouth daily. 02/24/20   Kathyrn Drown, MD  Specialty Vitamins Products (BRAIN PO) Take 1  capsule by mouth daily. FOREBRAIN COGNITIVE PERFORMANCE SUPPLEMENT    [provider]  Turmeric Curcumin 500 MG CAPS Take 500 mg by mouth daily. W/Ginger Powder    [provider]    Physical Exam: Vitals:   04/05/20 1137 04/05/20 1200 04/05/20 1230 04/05/20 1300  BP: (!) 166/86 (!) 164/75  (!) 181/63  Pulse: 72 56 78 56  Resp:  20 16 12   Temp:      TempSrc:      SpO2: 98% 96% 98% 96%  Weight:      Height:       Constitutional: Pleasant elderly female lying in bed she is awake and alert, NAD, calm, comfortable. Eyes: PERRL, lids and conjunctivae normal ENMT: Mucous membranes are moist. Posterior pharynx clear of any exudate or lesions.  Neck: normal, supple, no masses, no thyromegaly Respiratory: clear to auscultation bilaterally, no wheezing, no crackles. Normal respiratory effort. No accessory muscle use.  Cardiovascular: Regular rate and rhythm, no murmurs / rubs / gallops. No extremity edema. 2+ pedal pulses. No carotid bruits.  Abdomen: no tenderness, no masses palpated. No hepatosplenomegaly. Bowel sounds positive.  Musculoskeletal: Trace pretibial edema bilateral lower extremities.  No clubbing / cyanosis. No joint deformity upper and lower extremities.  Good ROM, no contractures. Normal muscle tone.  Skin: no rashes, lesions, ulcers. No induration Neurologic: CN 2-12 grossly intact. Sensation intact, DTR normal. Strength 5/5 in all 4.  Psychiatric: Normal judgment and insight. Alert and oriented x 3. Normal mood.   Labs on Admission: I have personally reviewed following labs and imaging studies  CBC: Recent Labs  Lab 04/05/20 0947  WBC 7.5  NEUTROABS 4.5  HGB 14.7  HCT 46.7*  MCV 98.7  PLT PLATELET CLUMPS NOTED ON SMEAR, COUNT APPEARS ADEQUATE   Basic Metabolic Panel: Recent Labs  Lab 04/05/20 0947  NA 138  K 3.1*  CL 95*  CO2 29  GLUCOSE 121*  BUN 17  CREATININE 0.99  CALCIUM 10.0   GFR: Estimated Creatinine Clearance: 56.2 mL/min (by C-G formula based on SCr of 0.99 mg/dL). Liver Function Tests: No results for input(s): AST, ALT, ALKPHOS, BILITOT, PROT, ALBUMIN in the last 168 hours. No results for input(s): LIPASE, AMYLASE in the last 168 hours. No results for input(s): AMMONIA in the last 168 hours. Coagulation Profile: No results for input(s): INR, PROTIME in the last 168 hours. Cardiac Enzymes: No results for input(s): CKTOTAL, CKMB, CKMBINDEX, TROPONINI in the last 168 hours. BNP (last 3 results) No results for input(s): PROBNP in the last 8760 hours. HbA1C: No results for input(s): HGBA1C in the last 72 hours. CBG: No results for input(s): GLUCAP in the last 168 hours. Lipid Profile: No results for input(s): CHOL, HDL, LDLCALC, TRIG, CHOLHDL, LDLDIRECT in the last 72 hours. Thyroid Function Tests: No results for input(s): TSH, T4TOTAL, FREET4, T3FREE, THYROIDAB in the last 72 hours. Anemia Panel: No results for input(s): VITAMINB12, FOLATE, FERRITIN, TIBC, IRON, RETICCTPCT in the last 72 hours. Urine analysis:    Component Value Date/Time   COLORURINE YELLOW 12/17/2015 2204   APPEARANCEUR CLEAR 12/17/2015 2204   LABSPEC 1.010 12/17/2015 2204   PHURINE 5.0 12/17/2015 2204   GLUCOSEU NEGATIVE  12/17/2015 2204   HGBUR NEGATIVE 12/17/2015 2204   BILIRUBINUR NEGATIVE 12/17/2015 2204   KETONESUR NEGATIVE 12/17/2015 2204   PROTEINUR NEGATIVE 12/17/2015 2204   UROBILINOGEN 1.0 01/03/2018 0929   NITRITE NEGATIVE 12/17/2015 2204   LEUKOCYTESUR Trace (A) 01/03/2018 0929    Radiological Exams on Admission: No  results found.  Assessment/Plan Principal Problem:   Upper GI bleeding Active Problems:   Essential hypertension   GERD   Esophageal dysphagia   Functional constipation   Hyperglycemia   Cognitive dysfunction   Leiomyoma of stomach   Heme positive stool   Atrial fibrillation (HCC)   Hypokalemia   1. Acute upper GI bleeding-likely exacerbated by chronic NSAID use.  Has been seen by GI and she has been started on IV Protonix infusion.  GI planning EGD later today.  Further recommendations to follow. 2. Essential hypertension, poorly controlled-awaiting home meds to be reconciled, IV Protonix ordered for high blood pressure readings. 3. Hypokalemia-likely secondary to recent diuretic use.  IV potassium replacement ordered.  Check magnesium level. 4. Atrial fibrillation-is not anticoagulated for unknown reason.   5. Hyperglycemia - check A1c.  6. GERD - Pt is on a protonix infusion.  7. OA - hold all NSAIDS, tylenol as needed for symptoms.   DVT prophylaxis: SCDs  Code Status: Full   Family Communication: patient updated @bedside   Disposition Plan: home   Consults called: GI   Admission status: OBS   Leimomi Zervas MD [facility] How to contact the [facility] Attending or Consulting provider [place] or covering provider during after hours [place], for this patient?  1. Check the care team in [facility] and look for a) attending/consulting [facility] provider listed and b) the [facility] team listed 2. Log into [email or web address] and use [place]'s universal password to access. If you do not have the password, please contact the hospital operator. 3. Locate the [facility] provider you are looking  for under [facility] and page to a number that you can be directly reached. 4. If you still have difficulty reaching the provider, please page the [facility] (Director on Call) for the Hospitalists listed on amion for assistance.   If 7PM-7AM, please contact night-coverage [email or web address] Password [email or web address]  04/05/2020, 1:54 PM

## 2020-04-05 NOTE — Progress Notes (Signed)
Brief EGD note.  Normal mucosa of the esophagus. Focal erythema at GE junction without mucosal disruption 3 cm sliding hiatal hernia. 10 mm submucosal lesion at fundus previously documented to be leiomyoma(EUS) appears stable without ulceration. No other mucosal abnormality noted to account for patient's GI bleed. Normal bulbar and post bulbar mucosa.

## 2020-04-06 ENCOUNTER — Encounter (HOSPITAL_COMMUNITY)
Admission: EM | Disposition: A | Discharge status: Home / Self Care | Payer: Self-pay | Source: Home / Self Care | Attending: Emergency Medicine

## 2020-04-06 ENCOUNTER — Telehealth: Payer: Self-pay | Admitting: Gastroenterology

## 2020-04-06 DIAGNOSIS — I4891 Unspecified atrial fibrillation: Secondary | ICD-10-CM | POA: Diagnosis not present

## 2020-04-06 DIAGNOSIS — K921 Melena: Secondary | ICD-10-CM | POA: Diagnosis not present

## 2020-04-06 DIAGNOSIS — R1013 Epigastric pain: Secondary | ICD-10-CM | POA: Diagnosis not present

## 2020-04-06 DIAGNOSIS — K219 Gastro-esophageal reflux disease without esophagitis: Secondary | ICD-10-CM | POA: Diagnosis not present

## 2020-04-06 DIAGNOSIS — K5904 Chronic idiopathic constipation: Secondary | ICD-10-CM | POA: Diagnosis not present

## 2020-04-06 DIAGNOSIS — E876 Hypokalemia: Secondary | ICD-10-CM | POA: Diagnosis not present

## 2020-04-06 DIAGNOSIS — K922 Gastrointestinal hemorrhage, unspecified: Secondary | ICD-10-CM | POA: Diagnosis not present

## 2020-04-06 DIAGNOSIS — I10 Essential (primary) hypertension: Secondary | ICD-10-CM | POA: Diagnosis not present

## 2020-04-06 DIAGNOSIS — R195 Other fecal abnormalities: Secondary | ICD-10-CM | POA: Diagnosis not present

## 2020-04-06 HISTORY — PX: GIVENS CAPSULE STUDY: SHX5432

## 2020-04-06 LAB — COMPREHENSIVE METABOLIC PANEL
ALT: 16 U/L (ref 0–44)
AST: 22 U/L (ref 15–41)
Albumin: 3.7 g/dL (ref 3.5–5.0)
Alkaline Phosphatase: 73 U/L (ref 38–126)
Anion gap: 9 (ref 5–15)
BUN: 11 mg/dL (ref 8–23)
CO2: 31 mmol/L (ref 22–32)
Calcium: 9.1 mg/dL (ref 8.9–10.3)
Chloride: 97 mmol/L — ABNORMAL LOW (ref 98–111)
Creatinine, Ser: 0.74 mg/dL (ref 0.44–1.00)
GFR calc Af Amer: >60 mL/min (ref >60)
GFR calc non Af Amer: >60 mL/min (ref >60)
Glucose, Bld: 109 mg/dL — ABNORMAL HIGH (ref 70–99)
Potassium: 2.6 mmol/L — CL (ref 3.5–5.1)
Sodium: 137 mmol/L (ref 135–145)
Total Bilirubin: 0.9 mg/dL (ref 0.3–1.2)
Total Protein: 6.9 g/dL (ref 6.5–8.1)

## 2020-04-06 LAB — MAGNESIUM: Magnesium: 1.9 mg/dL (ref 1.7–2.4)

## 2020-04-06 LAB — CBC
HCT: 42.1 % (ref 36.0–46.0)
Hemoglobin: 13.4 g/dL (ref 12.0–15.0)
MCH: 31.2 pg (ref 26.0–34.0)
MCHC: 31.8 g/dL (ref 30.0–36.0)
MCV: 97.9 fL (ref 80.0–100.0)
Platelets: 212 10*3/uL (ref 150–400)
RBC: 4.3 MIL/uL (ref 3.87–5.11)
RDW: 14.2 % (ref 11.5–15.5)
WBC: 7.8 10*3/uL (ref 4.0–10.5)
nRBC: 0 % (ref 0.0–0.2)

## 2020-04-06 LAB — POTASSIUM: Potassium: 3.3 mmol/L — ABNORMAL LOW (ref 3.5–5.1)

## 2020-04-06 SURGERY — IMAGING PROCEDURE, GI TRACT, INTRALUMINAL, VIA CAPSULE

## 2020-04-06 MED ORDER — POTASSIUM CHLORIDE 20 MEQ PO PACK
40.0000 meq | PACK | Freq: Once | ORAL | Status: AC
  Administered 2020-04-06: 40 meq via ORAL
  Filled 2020-04-06: qty 2

## 2020-04-06 MED ORDER — PANTOPRAZOLE SODIUM 40 MG PO TBEC
40.0000 mg | DELAYED_RELEASE_TABLET | Freq: Every day | ORAL | 1 refills | Status: DC
Start: 2020-04-06 — End: 2020-04-22

## 2020-04-06 MED ORDER — POTASSIUM CHLORIDE 10 MEQ/100ML IV SOLN
10.0000 meq | INTRAVENOUS | Status: AC
  Administered 2020-04-06 (×4): 10 meq via INTRAVENOUS
  Filled 2020-04-06 (×4): qty 100

## 2020-04-06 NOTE — Discharge Summary (Signed)
Physician Discharge Summary  Melissa Barajas PJK:[id number] DOB: 10/17/1940 DOA: 04/05/2020  PCP: Kathyrn Drown, MD  Admit date: 04/05/2020 Discharge date: 04/06/2020  Admitted From: home Disposition:  home  Recommendations for Outpatient Follow-up:  1. Follow up with PCP in 1-2 weeks 2. Please obtain BMP/CBC in one week 3. Please follow up gastroenterology as outpatient  Discharge Condition:stable CODE STATUS:full code Diet recommendation: heart healthy  Brief/Interim Summary: HPI: Melissa Barajas is a 79 y.o. female with medical history significant for chronic atrial fibrillation not anticoagulated, severe osteoarthritis and collagen vascular disease, diverticulosis of the colon, GERD, internal hemorrhoids, hypercholesterolemia, difficult to control hypertension reports that she started having stomach upset burning in the stomach three nights ago and took an acid reflux pill with no relief followed by Pepto-Bismol with no relief.  She eventually took a laxative and noticed that she was having jet black stools.  She also reports that she has been having dizziness.  She says that she takes etodolac for osteoarthritis.  She has been taking it for quite some time.  Her PCP has been trying to get her off of it however she says that she restarted it recently when she started having a flare of her osteoarthritis.  She says that she also recently started a new diuretic for blood pressure was recently started by her PCP on 03/24/2020.  ED Course: Temperature 98.0, pulse 65, BP 173/79, pulse ox 100% on room air.  Sodium 138, potassium 3.1, chloride 95, CO2 29, glucose 121, creatinine 0.99, calcium 10.0, GFR greater than 60, hemoglobin 14.7, hematocrit 46.7.  SARS two coronavirus test negative.  Fecal Hemoccult test positive.  Discharge Diagnoses:  Principal Problem:   Upper GI bleeding Active Problems:   Essential hypertension   GERD   Esophageal dysphagia   Functional constipation   Hyperglycemia    Cognitive dysfunction   Leiomyoma of stomach   Heme positive stool   Atrial fibrillation (HCC)   Hypokalemia   Melena  1. Acute upper GI bleeding-likely exacerbated by chronic NSAID use.  He was treated with PPI. He was seen by GI and underwent EGD and capsule study. Both studies did not show any active or recent bleeding. Hr hemoglobin remained stable and she did not have any recurrence of dark stools. She is felt stable for discharge.. 2. Essential hypertension, continue home regimen on discharge. 3. Hypokalemia-likely secondary to recent diuretic use.  IV potassium replacement ordered. Mag normal. 4. Atrial fibrillation-is not anticoagulated for unknown reason.   5. Hyperglycemia - A1c 5.9 6. GERD - continue PPI 7. OA - hold all NSAIDS, tylenol as needed for symptoms.   Discharge Instructions  Discharge Instructions    Diet - low sodium heart healthy   Complete by: As directed    Increase activity slowly   Complete by: As directed      Allergies as of 04/06/2020      Reactions   Aricept [donepezil Hcl]    Sleep deprived   Hydrocodone Nausea Only   Lisinopril Other (See Comments)   Angio edema   Naprosyn [naproxen] Nausea Only   Vioxx [rofecoxib]    Unknown reaction       Medication List    STOP taking these medications   etodolac 400 MG tablet Commonly known as: LODINE     TAKE these medications   albuterol 108 (90 Base) MCG/ACT inhaler Commonly known as: VENTOLIN HFA Inhale 2 puffs into the lungs every 4 (four) hours as needed for wheezing or  shortness of breath.   AZO BLADDER CONTROL/GO-LESS PO Take 1 tablet by mouth 2 (two) times daily.   BLACK CURRANT SEED OIL PO Take 5 mLs by mouth daily.   BRAIN PO Take 1 capsule by mouth daily. FOREBRAIN COGNITIVE PERFORMANCE SUPPLEMENT   Breo Ellipta 200-25 MCG/INH Aepb Generic drug: fluticasone furoate-vilanterol INHALE 1 PUFF INTO THE LUNGS DAILY What changed:   how much to take  how to take this  when to  take this  additional instructions   chlorthalidone 25 MG tablet Commonly known as: HYGROTON Take 1 tablet (25 mg total) by mouth daily.   diltiazem 360 MG 24 hr capsule Commonly known as: Cardizem CD Take 1 capsule (360 mg total) by mouth daily.   famotidine 40 MG tablet Commonly known as: PEPCID Take 1 tablet (40 mg total) by mouth daily.   milk thistle 175 MG tablet Take 175 mg by mouth daily.   pantoprazole 40 MG tablet Commonly known as: Protonix Take 1 tablet (40 mg total) by mouth daily.   potassium chloride 10 MEQ tablet Commonly known as: KLOR-CON TAKE 1 TABLET BY MOUTH  TWICE DAILY   QUNOL ULTRA COQ10 PO Take 5 mLs by mouth daily.   rosuvastatin 20 MG tablet Commonly known as: Crestor Take 1 tablet (20 mg total) by mouth daily.   SYSTANE OP Place 2 drops into both eyes 2 (two) times daily.   Turmeric Curcumin 500 MG Caps Take 500 mg by mouth daily. W/Ginger Powder   vitamin C 1000 MG tablet Take 1,000 mg by mouth daily.   Vitamin D3 50 MCG (2000 UT) Tabs Take 2,000 Units by mouth daily.       Follow-up Information    Montez Morita, Quillian Quince, MD. Schedule an appointment as soon as possible for a visit in 2 week(s).   Specialty: Gastroenterology Contact information: [address] [place] [address] [phone]              Allergies  Allergen Reactions  . Aricept [Donepezil Hcl]     Sleep deprived  . Hydrocodone Nausea Only  . Lisinopril Other (See Comments)    Angio edema  . Naprosyn [Naproxen] Nausea Only  . Vioxx [Rofecoxib]     Unknown reaction     Consultations:  Gastroenterology   Procedures/Studies:  No results found.    Subjective: Feeling better. No dark stools, no abdominal pain  Discharge Exam: Vitals:   04/06/20 0509 04/06/20 0511 04/06/20 0656 04/06/20 1416  BP: (!) 179/82 (!) 150/85  (!) 173/67  Pulse: 70 81  71  Resp:      Temp:    97.7 F (36.5 C)  TempSrc:    Oral  SpO2: 94%  97%  96%  Weight:      Height:   5\' 6"  (1.676 m)     General: Pt is alert, awake, not in acute distress Cardiovascular: RRR, S1/S2 +, no rubs, no gallops Respiratory: CTA bilaterally, no wheezing, no rhonchi Abdominal: Soft, NT, ND, bowel sounds + Extremities: no edema, no cyanosis    The results of significant diagnostics from this hospitalization (including imaging, microbiology, ancillary and laboratory) are listed below for reference.     Microbiology: Recent Results (from the past 240 hour(s))  SARS Coronavirus 2 by RT PCR (hospital order, performed in [facility] hospital lab) Nasopharyngeal Nasopharyngeal Swab     Status: None   Collection Time: 04/05/20 11:59 AM   Specimen: Nasopharyngeal Swab  Result Value Ref Range  Status   SARS Coronavirus 2 NEGATIVE NEGATIVE Final    Comment: (NOTE) SARS-CoV-2 target nucleic acids are NOT DETECTED.  The SARS-CoV-2 RNA is generally detectable in upper and lower respiratory specimens during the acute phase of infection. The lowest concentration of SARS-CoV-2 viral copies this assay can detect is 250 copies / mL. A negative result does not preclude SARS-CoV-2 infection and should not be used as the sole basis for treatment or other patient management decisions.  A negative result may occur with improper specimen collection / handling, submission of specimen other than nasopharyngeal swab, presence of viral mutation(s) within the areas targeted by this assay, and inadequate number of viral copies (<250 copies / mL). A negative result must be combined with clinical observations, patient history, and epidemiological information.  Fact Sheet for Patients:   StrictlyIdeas.no  Fact Sheet for Healthcare Providers: BankingDealers.co.za  This test is not yet approved or  cleared by the [place] FDA and has been authorized for detection and/or diagnosis of SARS-CoV-2 by FDA under an  Emergency Use Authorization (EUA).  This EUA will remain in effect (meaning this test can be used) for the duration of the COVID-19 declaration under Section 564(b)(1) of the Act, 21 U.S.C. section 360bbb-3(b)(1), unless the authorization is terminated or revoked sooner.  Performed at [facility], [address], [place], [place] [address]      Labs: BNP (last 3 results) No results for input(s): BNP in the last 8760 hours. Basic Metabolic Panel: Recent Labs  Lab 04/05/20 0947 04/06/20 0452 04/06/20 1610  NA 138 137  --   K 3.1* 2.6* 3.3*  CL 95* 97*  --   CO2 29 31  --   GLUCOSE 121* 109*  --   BUN 17 11  --   CREATININE 0.99 0.74  --   CALCIUM 10.0 9.1  --   MG 2.2 1.9  --    Liver Function Tests: Recent Labs  Lab 04/06/20 0452  AST 22  ALT 16  ALKPHOS 73  BILITOT 0.9  PROT 6.9  ALBUMIN 3.7   No results for input(s): LIPASE, AMYLASE in the last 168 hours. No results for input(s): AMMONIA in the last 168 hours. CBC: Recent Labs  Lab 04/05/20 0947 04/06/20 0452  WBC 7.5 7.8  NEUTROABS 4.5  --   HGB 14.7 13.4  HCT 46.7* 42.1  MCV 98.7 97.9  PLT PLATELET CLUMPS NOTED ON SMEAR, COUNT APPEARS ADEQUATE 212   Cardiac Enzymes: No results for input(s): CKTOTAL, CKMB, CKMBINDEX, TROPONINI in the last 168 hours. BNP: Invalid input(s): POCBNP CBG: No results for input(s): GLUCAP in the last 168 hours. D-Dimer No results for input(s): DDIMER in the last 72 hours. Hgb A1c Recent Labs    04/05/20 0947  HGBA1C 5.9*   Lipid Profile No results for input(s): CHOL, HDL, LDLCALC, TRIG, CHOLHDL, LDLDIRECT in the last 72 hours. Thyroid function studies No results for input(s): TSH, T4TOTAL, T3FREE, THYROIDAB in the last 72 hours.  Invalid input(s): FREET3 Anemia work up No results for input(s): VITAMINB12, FOLATE, FERRITIN, TIBC, IRON, RETICCTPCT in the last 72 hours. Urinalysis    Component Value Date/Time   COLORURINE YELLOW 12/17/2015 2204   APPEARANCEUR  CLEAR 12/17/2015 2204   LABSPEC 1.010 12/17/2015 2204   PHURINE 5.0 12/17/2015 [place] 12/17/2015 [place] 12/17/2015 [place] 12/17/2015 [place] 12/17/2015 2204   PROTEINUR NEGATIVE 12/17/2015 2204   UROBILINOGEN 1.0 01/03/2018  0929   NITRITE NEGATIVE 12/17/2015 2204   LEUKOCYTESUR Trace (A) 01/03/2018 0929   Sepsis Labs Invalid input(s): PROCALCITONIN,  WBC,  LACTICIDVEN Microbiology Recent Results (from the past 240 hour(s))  SARS Coronavirus 2 by RT PCR (hospital order, performed in [facility] hospital lab) Nasopharyngeal Nasopharyngeal Swab     Status: None   Collection Time: 04/05/20 11:59 AM   Specimen: Nasopharyngeal Swab  Result Value Ref Range Status   SARS Coronavirus 2 NEGATIVE NEGATIVE Final    Comment: (NOTE) SARS-CoV-2 target nucleic acids are NOT DETECTED.  The SARS-CoV-2 RNA is generally detectable in upper and lower respiratory specimens during the acute phase of infection. The lowest concentration of SARS-CoV-2 viral copies this assay can detect is 250 copies / mL. A negative result does not preclude SARS-CoV-2 infection and should not be used as the sole basis for treatment or other patient management decisions.  A negative result may occur with improper specimen collection / handling, submission of specimen other than nasopharyngeal swab, presence of viral mutation(s) within the areas targeted by this assay, and inadequate number of viral copies (<250 copies / mL). A negative result must be combined with clinical observations, patient history, and epidemiological information.  Fact Sheet for Patients:   StrictlyIdeas.no  Fact Sheet for Healthcare Providers: BankingDealers.co.za  This test is not yet approved or  cleared by the [place] FDA and has been authorized for detection and/or diagnosis of SARS-CoV-2 by FDA under an Emergency  Use Authorization (EUA).  This EUA will remain in effect (meaning this test can be used) for the duration of the COVID-19 declaration under Section 564(b)(1) of the Act, 21 U.S.C. section 360bbb-3(b)(1), unless the authorization is terminated or revoked sooner.  Performed at [facility], [address], [place], [place] [address]      Time coordinating discharge: 19mins  SIGNED:   Kathie Dike, MD  [facility] 04/06/2020, 10:45 PM   If 7PM-7AM, please contact night-coverage [email or web address]

## 2020-04-06 NOTE — Telephone Encounter (Signed)
Patient is getting discharged from the hospital likely later this evening or tomorrow. She needs OV follow-up in 4 weeks. She has follow-up with Roseanne Kaufman in December 2021 if we can move this appt up, that would be great. She can continue to see [name] if she has any openings. Otherwise, she can see any of the APPs.

## 2020-04-06 NOTE — Progress Notes (Signed)
CRITICAL VALUE ALERT  Critical Value:  Potassium 2.6  Date & Time Notied:  04/06/20 2552  Provider Notified:Zierle-Ghosh  Orders Received/Actions taken: awaiting orders

## 2020-04-06 NOTE — Care Management Obs Status (Signed)
[place] NOTIFICATION   Patient Details  Name: Melissa Barajas MRN: [id number] Date of Birth: [birth year]   Medicare Observation Status Notification Given:  Yes    Tommy Medal 04/06/2020, 4:31 PM

## 2020-04-06 NOTE — Progress Notes (Addendum)
Subjective: Feels well today. Occasionally feels like she wants to get a headache. Occasional short lived nausea without vomiting. No BM since admission. No abdominal pain at this time. States it had resolved prior to coming to the hospital.   Objective: Vital signs in last 24 hours: Temp:  [97.7 F (36.5 C)-98.3 F (36.8 C)] 98.3 F (36.8 C) (07/27 0506) Pulse Rate:  [50-89] 81 (07/27 0511) Resp:  [8-31] 16 (07/27 0506) BP: (134-183)/(63-90) 150/85 (07/27 0511) SpO2:  [85 %-100 %] 97 % (07/27 0511) Weight:  [93.4 kg] 93.4 kg (07/26 1410) Last BM Date: 04/04/20 General:   Alert and oriented, pleasant Head:  Normocephalic and atraumatic. Eyes:  No icterus, sclera clear. Conjuctiva pink.  Abdomen:  Bowel sounds present. Abdomen is soft and non-distended. Minimal TTP in the epigastric area. No HSM or hernias noted. No rebound or guarding. No masses appreciated  Msk:  Symmetrical without gross deformities.  Extremities:  Without edema. Psych:  Normal mood and affect.  Intake/Output from previous day: 07/26 0701 - 07/27 0700 In: 1092.2 [P.O.:120; I.V.:972.2] Out: -  Intake/Output this shift: No intake/output data recorded.  Lab Results: Recent Labs    04/05/20 0947 04/06/20 0452  WBC 7.5 7.8  HGB 14.7 13.4  HCT 46.7* 42.1  PLT PLATELET CLUMPS NOTED ON SMEAR, COUNT APPEARS ADEQUATE 212   BMET Recent Labs    04/05/20 0947 04/06/20 0452  NA 138 137  K 3.1* 2.6*  CL 95* 97*  CO2 29 31  GLUCOSE 121* 109*  BUN 17 11  CREATININE 0.99 0.74  CALCIUM 10.0 9.1   LFT Recent Labs    04/06/20 0452  PROT 6.9  ALBUMIN 3.7  AST 22  ALT 16  ALKPHOS 73  BILITOT 0.9    Assessment: 79 year old female with a history of intermittent GERD who was switched off of her PPI to an H2 receptor blocker recently.  She generally does well avoiding triggers and NSAIDs (although she did take an Aleve sometime last week).  She began having worsening upper abdominal pain for which her  Pepcid did not help, took her Protonix as well which did not help, took Pepto-Bismol.  Since then she had 2 to 3 days of "pitch black stools".  Denies symptoms of significant anemia.  Hemoglobin on admission 14.7.  She was started on PPI infusion.  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  Recommended given capsule.  Givens capsule was placed this morning. Last colonoscopy May 2019 for hematochezia with two 2-75mm polyps and one 69mm polyp, diverticulosis, internal and external hemorrhoids.  Suspected rectal bleeding secondary to internal hemorrhoids.  Pathology revealed hyperplastic, and prolapse type polyp. Hemoglobin 13.4 this morning. No overt GI bleeding. Suspect decline is likely dilutional as she is getting PPI infusion and IV fluids with potassium.    Doubt any significant GI bleed as etiology of melena but will follow-up on Given's capsule. This may very well have been secondary to pepto bismol. Dyspepsia may very well be functional. Minimal findings on EGD other than focal erythema at GEJ. Suspect she may benefit from resuming PPI daily rather than H2 blocker.   Plan: Follow-up on Given's Capsule Continue PPI infusion. Suspect this can be transitioned to PPI daily following Given's Capsule.  Continue clear liquids for now. May resume regular diet after capsule study is complete.  Monitor for overt GI  bleeding.  Monitor hemoglobin Avoid all NSAIDs.  Suspect discharge in the next 24 hours.    LOS: 0 days    04/06/2020, 11:37 AM   Aliene Altes, PA-C [facility]

## 2020-04-06 NOTE — Procedures (Signed)
Small Bowel Givens Capsule Study Procedure date:  04/06/2020  Referring Provider:  Dr. Kathie Dike, MD PCP:  Dr. Kathyrn Drown, MD  Indication for procedure:  Melena  First Gastric image:  00:00:51 First Duodenal image: 00:07:18 First Cecal image: 02:31:12 Gastric Passage time: 0h 60m Small Bowel Passage time:  2h 28m     Findings:  Scan stool present in distal small bowel, colon with copious amount of greenish stool. No evidence of lesions or active bleeding, no melena.  Summary & Recommendations: - Advance diet as tolerated - Stop IV PPI, put back on her home dose of PPI daily - Patient can be discharged home from GI standpoint - Patient will follow up in GI clinic with [place].

## 2020-04-07 ENCOUNTER — Encounter (HOSPITAL_COMMUNITY): Payer: Self-pay | Admitting: Internal Medicine

## 2020-04-07 ENCOUNTER — Encounter: Payer: Self-pay | Admitting: Internal Medicine

## 2020-04-07 NOTE — Telephone Encounter (Signed)
Noted  

## 2020-04-07 NOTE — Telephone Encounter (Signed)
RESCHEDULED TO SOONER APPOINTMENT AND ADDED TO THE CANCELLATION LIST

## 2020-04-09 ENCOUNTER — Ambulatory Visit: Payer: Medicare Other | Admitting: Family Medicine

## 2020-04-11 ENCOUNTER — Telehealth: Payer: Self-pay | Admitting: Family Medicine

## 2020-04-11 DIAGNOSIS — E876 Hypokalemia: Secondary | ICD-10-CM

## 2020-04-11 DIAGNOSIS — R5383 Other fatigue: Secondary | ICD-10-CM

## 2020-04-11 NOTE — Telephone Encounter (Signed)
Nurses Please reach out to the patient Let her know that we are aware that she was in the hospital and she has a follow-up visit with [place] on the fifth I would like for the patient to do blood work either Tuesday or Wednesday so that we will have results when she comes Please have her do metabolic 7, magnesium, CBC Please see if she has any needs currently Please encourage her to bring her medications with her when she comes Stay away from all anti-inflammatories Diagnosis hypokalemia, renal insufficiency, anemia due to GI blood loss

## 2020-04-12 NOTE — Addendum Note (Signed)
Addended by: Dairl Ponder on: 04/12/2020 09:47 AM   Modules accepted: Orders

## 2020-04-12 NOTE — Telephone Encounter (Signed)
Blood work ordered in Epic. Left message to return call 

## 2020-04-12 NOTE — Telephone Encounter (Signed)
Patient notified and verbalized understanding. 

## 2020-04-13 DIAGNOSIS — R5383 Other fatigue: Secondary | ICD-10-CM | POA: Diagnosis not present

## 2020-04-13 DIAGNOSIS — E876 Hypokalemia: Secondary | ICD-10-CM | POA: Diagnosis not present

## 2020-04-14 LAB — BASIC METABOLIC PANEL
BUN/Creatinine Ratio: 11 — ABNORMAL LOW (ref 12–28)
BUN: 14 mg/dL (ref 8–27)
CO2: 30 mmol/L — ABNORMAL HIGH (ref 20–29)
Calcium: 10 mg/dL (ref 8.7–10.3)
Chloride: 92 mmol/L — ABNORMAL LOW (ref 96–106)
Creatinine, Ser: 1.26 mg/dL — ABNORMAL HIGH (ref 0.57–1.00)
GFR calc Af Amer: 48 mL/min/{1.73_m2} — ABNORMAL LOW (ref >59)
GFR calc non Af Amer: 41 mL/min/{1.73_m2} — ABNORMAL LOW (ref >59)
Glucose: 137 mg/dL — ABNORMAL HIGH (ref 65–99)
Potassium: 3.5 mmol/L (ref 3.5–5.2)
Sodium: 138 mmol/L (ref 134–144)

## 2020-04-14 LAB — CBC WITH DIFFERENTIAL/PLATELET
Basophils Absolute: 0.1 10*3/uL (ref 0.0–0.2)
Basos: 1 %
EOS (ABSOLUTE): 0.1 10*3/uL (ref 0.0–0.4)
Eos: 1 %
Hematocrit: 43.1 % (ref 34.0–46.6)
Hemoglobin: 14.2 g/dL (ref 11.1–15.9)
Immature Grans (Abs): 0 10*3/uL (ref 0.0–0.1)
Immature Granulocytes: 0 %
Lymphocytes Absolute: 2.1 10*3/uL (ref 0.7–3.1)
Lymphs: 20 %
MCH: 31.3 pg (ref 26.6–33.0)
MCHC: 32.9 g/dL (ref 31.5–35.7)
MCV: 95 fL (ref 79–97)
Monocytes Absolute: 1.3 10*3/uL — ABNORMAL HIGH (ref 0.1–0.9)
Monocytes: 12 %
Neutrophils Absolute: 7.3 10*3/uL — ABNORMAL HIGH (ref 1.4–7.0)
Neutrophils: 66 %
Platelets: 202 10*3/uL (ref 150–450)
RBC: 4.53 x10E6/uL (ref 3.77–5.28)
RDW: 13.1 % (ref 11.7–15.4)
WBC: 10.9 10*3/uL — ABNORMAL HIGH (ref 3.4–10.8)

## 2020-04-14 LAB — MAGNESIUM: Magnesium: 1.9 mg/dL (ref 1.6–2.3)

## 2020-04-15 ENCOUNTER — Ambulatory Visit (INDEPENDENT_AMBULATORY_CARE_PROVIDER_SITE_OTHER): Payer: Medicare Other | Admitting: Family Medicine

## 2020-04-15 ENCOUNTER — Other Ambulatory Visit: Payer: Self-pay

## 2020-04-15 ENCOUNTER — Encounter: Payer: Self-pay | Admitting: Family Medicine

## 2020-04-15 VITALS — BP 146/86 | HR 85 | Temp 97.5°F | Wt 186.6 lb

## 2020-04-15 DIAGNOSIS — E876 Hypokalemia: Secondary | ICD-10-CM | POA: Diagnosis not present

## 2020-04-15 DIAGNOSIS — I10 Essential (primary) hypertension: Secondary | ICD-10-CM | POA: Diagnosis not present

## 2020-04-15 MED ORDER — POTASSIUM CHLORIDE CRYS ER 10 MEQ PO TBCR: 10.0000 meq | EXTENDED_RELEASE_TABLET | Freq: Every day | ORAL | 0 refills | Status: DC

## 2020-04-15 NOTE — Progress Notes (Addendum)
   Subjective:    Patient ID: Melissa Barajas, female    DOB: Sep 22, 1940, 79 y.o.   MRN: [id number]  HPI Patient comes in today for follow up on hypertension after starting diuretic.   Patient went to ER for back stools last week, work up was negative but patient reports stomach pains that come and go, just wanting to lay in the bed, not feeling well at all and her head feeling "funny".  Review of Systems  Constitutional: Negative for activity change, fatigue and fever.  HENT: Negative for congestion and rhinorrhea.   Respiratory: Negative for cough, chest tightness and shortness of breath.   Cardiovascular: Negative for chest pain and leg swelling.  Gastrointestinal: Negative for abdominal pain and nausea.  Skin: Negative for color change.  Neurological: Negative for dizziness and headaches.  Psychiatric/Behavioral: Negative for agitation and behavioral problems.       Objective:   Physical Exam Vitals reviewed.  Constitutional:      General: She is not in acute distress. HENT:     Head: Normocephalic and atraumatic.  Eyes:     General:        Right eye: No discharge.        Left eye: No discharge.  Neck:     Trachea: No tracheal deviation.  Cardiovascular:     Rate and Rhythm: Normal rate and regular rhythm.     Heart sounds: Normal heart sounds. No murmur heard.   Pulmonary:     Effort: Pulmonary effort is normal. No respiratory distress.     Breath sounds: Normal breath sounds.  Lymphadenopathy:     Cervical: No cervical adenopathy.  Skin:    General: Skin is warm and dry.  Neurological:     Mental Status: She is alert.     Coordination: Coordination normal.  Psychiatric:        Behavior: Behavior normal.     Hospital note, labs were reviewed in detail      Assessment & Plan:  HTN Diuretic is causing creatinine to go up Stop diuretic Patient does not want to start new medicine Hold off on starting new meds Continue diltiazem Recheck patient in 10 to 14  days.  At that time may need to add hydralazine Please reduce the potassium to 1 daily Repeat metabolic 7 in 3 to 4 weeks  Patient relates she is feeling bad it is quite possible diuretic is making her feel this way  No recurrence of black stools unlikely to have GI bleed currently has follow-up with gastroenterology   Patient was advised to stay away from all NSAIDs use Tylenol for discomfort only

## 2020-04-15 NOTE — Patient Instructions (Signed)
Reduce the potassium to one daily Stop chlorthalidone Recheck here in about 10 days

## 2020-04-19 ENCOUNTER — Other Ambulatory Visit: Payer: Self-pay | Admitting: Family Medicine

## 2020-04-19 NOTE — Progress Notes (Signed)
Patient notified and advised to repeat the blood work in 3-4 weeks- mid day wen well hydrated. Patient verbalized understanding.

## 2020-04-19 NOTE — Progress Notes (Signed)
04/19/20 at 11:16am- Blood work ordered in [employer]- Left message to return call to notify patient

## 2020-04-19 NOTE — Addendum Note (Signed)
Addended by: Dairl Ponder on: 04/19/2020 11:15 AM   Modules accepted: Orders

## 2020-04-21 NOTE — Telephone Encounter (Signed)
Due to multiple reasons this patient should not be on this medicine please send [facility] mail service a message letting them know we have stopped this medicine

## 2020-04-21 NOTE — Telephone Encounter (Signed)
Med check up 04/15/20

## 2020-04-22 ENCOUNTER — Ambulatory Visit (INDEPENDENT_AMBULATORY_CARE_PROVIDER_SITE_OTHER): Payer: Medicare Other | Admitting: Gastroenterology

## 2020-04-22 ENCOUNTER — Encounter (INDEPENDENT_AMBULATORY_CARE_PROVIDER_SITE_OTHER): Payer: Self-pay | Admitting: Gastroenterology

## 2020-04-22 ENCOUNTER — Other Ambulatory Visit: Payer: Self-pay

## 2020-04-22 VITALS — BP 131/79 | HR 81 | Temp 98.6°F | Ht 66.0 in | Wt 187.3 lb

## 2020-04-22 DIAGNOSIS — K219 Gastro-esophageal reflux disease without esophagitis: Secondary | ICD-10-CM | POA: Diagnosis not present

## 2020-04-22 DIAGNOSIS — K921 Melena: Secondary | ICD-10-CM | POA: Diagnosis not present

## 2020-04-22 NOTE — Progress Notes (Signed)
Melissa Barajas, M.D. Gastroenterology & Hepatology [facility] [address] [place], [place] [address]  Primary Care Physician: Kathyrn Drown, MD [place] [id number]  I will communicate my assessment and recommendations to the referring MD via EMR. "Note: Occasional unusual wording and randomly placed punctuation marks may result from the use of speech recognition technology to transcribe this document"  Problems: 1. Episode of melena 2. GERD 3. History of Schatzki's ring 4. Gastric leiomyoma  History of Present Illness: Melissa Barajas is a 79 y.o. female with past medical history of GERD, Schatzki's ring status post dilation, gastric leiomyoma, hiatal hernia, who presents for follow up of after recent hospitalization for episodes of melena.  Briefly, the patient came to the hospital on 04/05/2020 after presenting new onset symptoms of lightheadedness and melena which were preceded by epigastric burning sensation.  The patient actually reported that she took some Pepto-Bismol she noticed the melena, but even though she stopped the medication she persisted with melena for total of 4 days.  She denies having any hematemesis, or rectal bleeding.  Due to this, patient decided to come to the ER where she was found to have a hemoglobin of 14.7.  She remained hemodynamically stable.  She underwent an EGD same day of admission, was found to have a 3 cm hiatal hernia and presence of 10 mm leiomyoma (this was previously confirmed to be this lesion in March 2021) without any other alteration.  The disease, she underwent a capsule endoscopy which did not show any alterations in her stomach or small bowel, there was presence of copious amount of greenish stool but no presence of fresh blood in the lumen or stigmata of bleeding.  The patient was discharged home on PPI once a day as she was previously taking.  Patient reports  feeling really well today, denies having any complaints.  Patient reported having some lightheadedness recently after starting a diuretic, but this has improved after stopping the medication. Patient reports that she currently takes famotidine 40 mg qday for her GERD, which has controlled her symptoms. Was on pantoprazole before but was switched by one of her doctors. The patient denies having any recent nausea, vomiting, fever, chills, hematochezia, melena, hematemesis, abdominal distention, abdominal pain, diarrhea, jaundice, pruritus or weight loss.  Past Medical History: Past Medical History:  Diagnosis Date  . Arthritis   . Atrial fibrillation ([place])   . Collagen vascular disease ([place])   . DIVERTICULOSIS OF COLON 08/25/2010   Qualifier: Diagnosis of  By: Hoy Morn    . GERD (gastroesophageal reflux disease)   . HEMORRHOIDS, INTERNAL 08/25/2010   Qualifier: Diagnosis of  By: Hoy Morn    . Hypercholesteremia   . Hypertension   . 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 Lipoma of arm    Left    Past Surgical History: Past Surgical History:  Procedure Laterality Date  . ABDOMINAL HYSTERECTOMY     Partial  . BIOPSY  10/24/2019   Procedure: BIOPSY;  Surgeon: Danie Binder, MD;  [facility];  Service: Endoscopy;;  . COLONOSCOPY  2009   SLF: 1. screening colonoscopy in 10 years 2. She should follow a high fiber diet She has been given a hand out on high fiber diverticulosois and hemorrhoids  . COLONOSCOPY N/A 02/07/2018   Procedure: COLONOSCOPY;  Surgeon: Danie Binder, MD;  [facility];  Service: Endoscopy;  Laterality: N/A;  1:00pm  . CYSTECTOMY     Left breast  . 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 ESOPHAGOGASTRODUODENOSCOPY N/A 10/25/2015    Procedure: ESOPHAGOGASTRODUODENOSCOPY (EGD);  Surgeon: Danie Binder, MD;  [facility];  Service: Endoscopy;  Laterality: N/A;  1215pm  . ESOPHAGOGASTRODUODENOSCOPY N/A 10/24/2019   Schatzki's ring s/p dilation and single medium firm, probable submucosal nodule without bleeding. Medium sized hiatal hernia, normal duodenum.   . ESOPHAGOGASTRODUODENOSCOPY N/A 04/05/2020   Procedure: ESOPHAGOGASTRODUODENOSCOPY (EGD);  Surgeon: Rogene Houston, MD;  [facility];  Service: Endoscopy;  Laterality: N/A;  . EUS  11/2019   Dr. Paulita Fujita: EUS completed March 2021 with likely leiomyoma, no other concerns. 10 mm by 10 mm.   Marland Kitchen GIVENS CAPSULE STUDY N/A 04/06/2020   Procedure: GIVENS CAPSULE STUDY;  Surgeon: Rogene Houston, MD;  [facility];  Service: Endoscopy;  Laterality: N/A;  . POLYPECTOMY  02/07/2018   Procedure: POLYPECTOMY;  Surgeon: Danie Binder, MD;  [facility];  Service: Endoscopy;;  ascending colon, descending, sigmoid  . SAVORY DILATION N/A 10/25/2015   Procedure: SAVORY DILATION;  Surgeon: Danie Binder, MD;  [facility];  Service: Endoscopy;  Laterality: N/A;  . SAVORY DILATION N/A 10/24/2019   Procedure: SAVORY DILATION;  Surgeon: Danie Binder, MD;  [facility];  Service: Endoscopy;  Laterality: N/A;  . THYROIDECTOMY    . Tumor   05/2010   Left breast under nipple  . VESICOVAGINAL FISTULA CLOSURE W/ T[facility]     fibroid tumors    Family History: Family History  Problem Relation Age of Onset  . Cancer Brother        Prostate    Social History: Social History   Tobacco Use  Smoking Status Former Smoker  . Packs/day: 1.00  . Years: 15.00  . Pack years: 15.00  . Types: Cigarettes  . Start date: 09/12/1983  . Quit date: 09/11/1993  . Years since quitting: 26.6  Smokeless Tobacco Never Used   Social History   Substance and Sexual Activity  Alcohol Use No  . Alcohol/week: 0.0 standard drinks   Social History    Substance and Sexual Activity  Drug Use No    Allergies: Allergies  Allergen Reactions  . Aricept [Donepezil Hcl]     Sleep deprived  . Hydrocodone Nausea Only  . Lisinopril Other (See Comments)    Angio edema  . Naprosyn [Naproxen] Nausea Only  . Vioxx [Rofecoxib]     Unknown reaction     Medications: Current Outpatient Medications  Medication Sig Dispense Refill  . albuterol (VENTOLIN HFA) 108 (90 Base) MCG/ACT inhaler Inhale 2 puffs into the lungs every 4 (four) hours as needed for wheezing or shortness of breath. 1 Inhaler 0  . Ascorbic Acid (VITAMIN C) 1000 MG tablet Take 1,000 mg by mouth daily.    Marland Kitchen BLACK CURRANT SEED OIL PO Take 5 mLs by mouth daily.    . Cholecalciferol (VITAMIN D3) 50 MCG (2000 UT) TABS Take 2,000 Units by mouth daily.    . Coenzyme Q10-Vitamin E (QUNOL ULTRA COQ10 PO) Take 5 mLs by mouth daily.    Marland Kitchen diltiazem (CARDIZEM CD) 360 MG 24 hr capsule Take 1 capsule (360 mg total) by mouth daily. 90 capsule 1  . famotidine (PEPCID)  40 MG tablet Take 1 tablet (40 mg total) by mouth daily. 90 tablet 1  . fluticasone furoate-vilanterol (BREO ELLIPTA) 200-25 MCG/INH AEPB INHALE 1 PUFF INTO THE LUNGS DAILY (Patient taking differently: Inhale 1 puff into the lungs daily. ) 180 each 2  . milk thistle 175 MG tablet Take 175 mg by mouth daily.    . Phenazopyridine HCl (AZO-STANDARD PO) Take by mouth in the morning and at bedtime.    Vladimir Faster Glycol-Propyl Glycol (SYSTANE OP) Place 2 drops into both eyes 2 (two) times daily.     . potassium chloride (KLOR-CON) 10 MEQ tablet Take 1 tablet (10 mEq total) by mouth daily. 90 tablet 0  . Pumpkin Seed-Soy Germ (AZO BLADDER CONTROL/GO-LESS PO) Take 1 tablet by mouth 2 (two) times daily.    . rosuvastatin (CRESTOR) 20 MG tablet Take 1 tablet (20 mg total) by mouth daily. 90 tablet 1  . Specialty Vitamins Products (BRAIN PO) Take 1 capsule by mouth daily. FOREBRAIN COGNITIVE PERFORMANCE SUPPLEMENT    . pantoprazole  (PROTONIX) 40 MG tablet Take 1 tablet (40 mg total) by mouth daily. (Patient not taking: Reported on 04/22/2020) 30 tablet 1  . Turmeric Curcumin 500 MG CAPS Take 500 mg by mouth daily. W/Ginger Powder     No current facility-administered medications for this visit.    Review of Systems: GENERAL: negative for malaise, night sweats HEENT: No changes in hearing or vision, no nose bleeds or other nasal problems. NECK: Negative for lumps, goiter, pain and significant neck swelling RESPIRATORY: Negative for cough, wheezing CARDIOVASCULAR: Negative for chest pain, leg swelling, palpitations, orthopnea GI: SEE HPI MUSCULOSKELETAL: Negative for joint pain or swelling, back pain, and muscle pain. SKIN: Negative for lesions, rash PSYCH: Negative for sleep disturbance, mood disorder and recent psychosocial stressors. HEMATOLOGY Negative for prolonged bleeding, bruising easily, and swollen nodes. ENDOCRINE: Negative for cold or heat intolerance, polyuria, polydipsia and goiter. NEURO: negative for tremor, gait imbalance, syncope and seizures. The remainder of the review of systems is noncontributory.   Physical Exam: BP 131/79 (BP Location: Right Arm, Patient Position: Sitting, Cuff Size: Normal)   Pulse 81   Temp 98.6 F (37 C) (Oral)   Ht 5\' 6"  (1.676 m)   Wt 187 lb 4.8 oz (85 kg)   BMI 30.23 kg/m  GENERAL: The patient is AO x3, in no acute distress. HEENT: Head is normocephalic and atraumatic. EOMI are intact. Mouth is well hydrated and without lesions. NECK: Supple. No masses LUNGS: Clear to auscultation. No presence of rhonchi/wheezing/rales. Adequate chest expansion HEART: RRR, normal s1 and s2. ABDOMEN: Soft, nontender, no guarding, no peritoneal signs, and nondistended. BS +. No masses. EXTREMITIES: Without any cyanosis, clubbing, rash, lesions or edema. NEUROLOGIC: AOx3, no focal motor deficit. SKIN: no jaundice, no rashes  Imaging/Labs: as above  I personally reviewed and  interpreted the available labs, imaging and endoscopic files.  Impression and Plan: FLOYD LUSIGNAN is a 79 y.o. female with past medical history of GERD, Schatzki's ring status post dilation, gastric leiomyoma, hiatal hernia, who presents for follow up of after recent hospitalization for episodes of melena.  The patient underwent an EGD and a capsule endoscopy during her hospitalization, was not found to have any source of active bleeding or stigmata of previous bleeding.  Her hemoglobin remained stable above 13 throughout her hospitalization.  It could be possible that her symptoms were related to the intake of Pepto-Bismol but is unclear.  Nevertheless, no source of active bleeding was  found and she has remained asymptomatic since then.  At this point, she needs to continue taking her medications including Pepcid, which she can switch back to pantoprazole once a day if her heartburn episodes recur.  I advised the patient that she needs to call us back if she has any recurrent episodes of melena as this will offer the best window to do a repeat capsule endoscopy and detect the site of bleeding.  The patient understood and agreed.  - Continue with Pepcid 40 mg qday -Patient to call us back if she has any recurrent episodes of melena to repeat stat capsule endoscopy  All questions were answered.      Harvel Quale, MD Gastroenterology and Hepatology [facility]

## 2020-04-22 NOTE — Patient Instructions (Signed)
Please call us back immediately if you have any episodes of black stool or rectal bleeding

## 2020-04-29 ENCOUNTER — Encounter: Payer: Self-pay | Admitting: Family Medicine

## 2020-04-29 ENCOUNTER — Other Ambulatory Visit: Payer: Self-pay

## 2020-04-29 ENCOUNTER — Ambulatory Visit (INDEPENDENT_AMBULATORY_CARE_PROVIDER_SITE_OTHER): Payer: Medicare Other | Admitting: Family Medicine

## 2020-04-29 VITALS — BP 144/76 | Temp 95.4°F | Wt 191.0 lb

## 2020-04-29 DIAGNOSIS — M545 Low back pain, unspecified: Secondary | ICD-10-CM

## 2020-04-29 DIAGNOSIS — I10 Essential (primary) hypertension: Secondary | ICD-10-CM

## 2020-04-29 LAB — POCT URINALYSIS DIPSTICK
Spec Grav, UA: <=1.005 — AB (ref 1.010–1.025)
pH, UA: 5 (ref 5.0–8.0)

## 2020-04-29 NOTE — Progress Notes (Signed)
   Subjective:    Patient ID: Melissa Barajas, female    DOB: 29-Aug-1941, 79 y.o.   MRN: [id number]  HPI Pt here for follow up on blood pressure. Pt states she has not been checking BP regular. Pt states every now and then she will feel "woosy".  Denies any chest tightness pressure pain shortness of breath  Pt also states that she is having lower back pain. Began Saturday. Pt states she has a hard time getting out of bed in the morning.  Denies radiation down the legs denies numbness tingling.  States hurts to get up in the morning hurts to sit on the edge of the bed takes a while to feel better been going on for about a week no known injury   Review of Systems Please see above    Objective:   Physical Exam  Lungs are clear heart rate controlled extremities no edema skin warm dry subjective low back pain negative straight leg raise difficult time flexing      Assessment & Plan:  Lumbar pain Stretching exercises Tylenol warm compresses If not dramatically better in the course of the next 7 to 10 days lumbar x-rays  HTN decent control systolic slightly elevated but patient is having side effects from medication if we go up on the dose of the blood pressure medicine will only make her side effects worse continue as is follow-up 4 months

## 2020-05-05 ENCOUNTER — Other Ambulatory Visit: Payer: Self-pay | Admitting: Family Medicine

## 2020-05-26 ENCOUNTER — Encounter: Payer: Self-pay | Admitting: Gastroenterology

## 2020-05-26 ENCOUNTER — Other Ambulatory Visit: Payer: Self-pay

## 2020-05-26 ENCOUNTER — Ambulatory Visit (INDEPENDENT_AMBULATORY_CARE_PROVIDER_SITE_OTHER): Payer: Medicare Other | Admitting: Gastroenterology

## 2020-05-26 ENCOUNTER — Telehealth: Payer: Self-pay | Admitting: Gastroenterology

## 2020-05-26 VITALS — BP 140/79 | HR 76 | Temp 97.1°F | Ht 66.0 in | Wt 187.2 lb

## 2020-05-26 DIAGNOSIS — K219 Gastro-esophageal reflux disease without esophagitis: Secondary | ICD-10-CM | POA: Diagnosis not present

## 2020-05-26 DIAGNOSIS — D214 Benign neoplasm of connective and other soft tissue of abdomen: Secondary | ICD-10-CM | POA: Diagnosis not present

## 2020-05-26 NOTE — Progress Notes (Signed)
Referring Provider: Kathyrn Drown, MD Primary Care Physician:  Kathyrn Drown, MD Primary GI: Dr. Abbey Barajas   Chief Complaint  Patient presents with  . Gastroesophageal Reflux    f/u. reports pepcid is not working as well  . Abdominal Pain    comes/goes, upper abd    HPI:   Melissa Barajas is a 79 y.o. 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 Felt to possibly be pepto bismol contributing to reports of black stool.   States she was taken off Protonix by another provider but Pepcid not helping as much as pantoprazole. Feels like reflux not well controlled. Intermittent upper abdominal discomfort that is fleeting. Had episode this morning. Had clam chowder and sweet potato cobbler for dinner. Has plenty of Protonix at home. No N/V. Appetite is good. No constipation or diarrhea.   Past Medical History:  Diagnosis Date  . Arthritis   . Atrial fibrillation ([place])   . Collagen vascular disease ([place])   . DIVERTICULOSIS OF COLON 08/25/2010   Qualifier: Diagnosis of  By: Hoy Morn    . GERD (gastroesophageal reflux disease)   . HEMORRHOIDS, INTERNAL 08/25/2010   Qualifier: Diagnosis of  By: Hoy Morn    . Hypercholesteremia   . Hypertension   . 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 Lipoma of arm    Left    Past Surgical History:  Procedure Laterality Date  . ABDOMINAL HYSTERECTOMY     Partial  . BIOPSY  10/24/2019   Procedure: BIOPSY;  Surgeon: Danie Binder, MD;  [facility];  Service: Endoscopy;;  . COLONOSCOPY  2009   SLF: 1. screening colonoscopy in 10 years 2. She should follow a high fiber diet She has been given a hand out on high fiber diverticulosois and hemorrhoids  . COLONOSCOPY  N/A 02/07/2018   Procedure: COLONOSCOPY;  Surgeon: Danie Binder, MD;  [facility];  Service: Endoscopy;  Laterality: N/A;  1:00pm  . CYSTECTOMY     Left breast  . ESOPHAGOGASTRODUODENOSCOPY     [id number] appearing Schatzki's ring, possible cervical esophageal web, both dilated with passage of a Maloney dilator/small hiatal hernia  . ESOPHAGOGASTRODUODENOSCOPY N/A 10/25/2015   Procedure: ESOPHAGOGASTRODUODENOSCOPY (EGD);  Surgeon: Danie Binder, MD;  [facility];  Service: Endoscopy;  Laterality: N/A;  1215pm  . ESOPHAGOGASTRODUODENOSCOPY N/A 10/24/2019   Schatzki's ring s/p dilation and single medium firm, probable submucosal nodule without bleeding. Medium sized hiatal hernia, normal duodenum.   . ESOPHAGOGASTRODUODENOSCOPY N/A 04/05/2020   10 mm leiomyoma and capsule study without obvious lesion or bleeding.  . EUS  11/2019   Dr. Paulita Fujita: EUS completed March 2021 with likely leiomyoma, no other concerns. 10 mm by 10 mm.   Marland Kitchen GIVENS CAPSULE STUDY N/A 04/06/2020   Procedure: GIVENS CAPSULE STUDY;  Surgeon: Rogene Houston, MD;  [facility];  Service: Endoscopy;  Laterality: N/A;  . POLYPECTOMY  02/07/2018   Procedure: POLYPECTOMY;  Surgeon: Danie Binder, MD;  [facility];  Service: Endoscopy;;  ascending colon, descending, sigmoid  . SAVORY DILATION N/A 10/25/2015   Procedure: SAVORY DILATION;  Surgeon: Danie Binder, MD;  [facility];  Service: Endoscopy;  Laterality:  N/A;  . SAVORY DILATION N/A 10/24/2019   Procedure: SAVORY DILATION;  Surgeon: Danie Binder, MD;  [facility];  Service: Endoscopy;  Laterality: N/A;  . THYROIDECTOMY    . Tumor   05/2010   Left breast under nipple  . VESICOVAGINAL FISTULA CLOSURE W/ T[facility]     fibroid tumors    Current Outpatient Medications  Medication Sig Dispense Refill  . albuterol (VENTOLIN HFA) 108 (90 Base) MCG/ACT inhaler Inhale 2 puffs into the lungs every 4 (four)  hours as needed for wheezing or shortness of breath. 1 Inhaler 0  . Ascorbic Acid (VITAMIN C) 1000 MG tablet Take 1,000 mg by mouth daily.    Marland Kitchen BLACK CURRANT SEED OIL PO Take 5 mLs by mouth daily.    . Cholecalciferol (VITAMIN D3) 50 MCG (2000 UT) TABS Take 2,000 Units by mouth daily.    . Coenzyme Q10-Vitamin E (QUNOL ULTRA COQ10 PO) Take 5 mLs by mouth daily.    Marland Kitchen diltiazem (CARDIZEM CD) 360 MG 24 hr capsule Take 1 capsule (360 mg total) by mouth daily. 90 capsule 1  . famotidine (PEPCID) 40 MG tablet Take 1 tablet (40 mg total) by mouth daily. 90 tablet 1  . fluticasone furoate-vilanterol (BREO ELLIPTA) 200-25 MCG/INH AEPB INHALE 1 PUFF INTO THE LUNGS DAILY (Patient taking differently: Inhale 1 puff into the lungs daily. ) 180 each 2  . milk thistle 175 MG tablet Take 175 mg by mouth daily.    . Phenazopyridine HCl (AZO-STANDARD PO) Take by mouth in the morning and at bedtime.    Vladimir Faster Glycol-Propyl Glycol (SYSTANE OP) Place 2 drops into both eyes 2 (two) times daily.     . potassium chloride (KLOR-CON) 10 MEQ tablet Take 1 tablet (10 mEq total) by mouth daily. 90 tablet 0  . Pumpkin Seed-Soy Germ (AZO BLADDER CONTROL/GO-LESS PO) Take 1 tablet by mouth 2 (two) times daily.    . rosuvastatin (CRESTOR) 20 MG tablet Take 1 tablet (20 mg total) by mouth daily. 90 tablet 1  . Specialty Vitamins Products (BRAIN PO) Take 1 capsule by mouth daily. FOREBRAIN COGNITIVE PERFORMANCE SUPPLEMENT    . Turmeric Curcumin 500 MG CAPS Take 500 mg by mouth daily. W/Ginger Powder     No current facility-administered medications for this visit.    Allergies as of 05/26/2020 - Review Complete 05/26/2020  Allergen Reaction Noted  . Aricept [donepezil hcl]  11/20/2017  . Hydrocodone Nausea Only 01/08/2013  . Lisinopril Other (See Comments) 01/08/2013  . Naprosyn [naproxen] Nausea Only 04/06/2016  . Vioxx [rofecoxib]  01/08/2013    Family History  Problem Relation Age of Onset  . Cancer Brother         Prostate    Social History   Socioeconomic History  . Marital status: Married    Spouse name: Not on file  . Number of children: Not on file  . Years of education: Not on file  . Highest education level: Not on file  Occupational History  . Not on file  Tobacco Use  . Smoking status: Former Smoker    Packs/day: 1.00    Years: 15.00    Pack years: 15.00    Types: Cigarettes    Start date: 09/12/1983    Quit date: 09/11/1993    Years since quitting: 26.7  . Smokeless tobacco: Never Used  Vaping Use  . Vaping Use: Never used  Substance and Sexual Activity  . Alcohol use: No    Alcohol/week:  0.0 standard drinks  . Drug use: No  . Sexual activity: Not on file  Other Topics Concern  . Not on file  Social History Narrative  . Not on file   Social Determinants of Health   Financial Resource Strain:   . Difficulty of Paying Living Expenses: Not on file  Food Insecurity:   . Worried About Charity fundraiser in the Last Year: Not on file  . Ran Out of Food in the Last Year: Not on file  Transportation Needs:   . Lack of Transportation (Medical): Not on file  . Lack of Transportation (Non-Medical): Not on file  Physical Activity:   . Days of Exercise per Week: Not on file  . Minutes of Exercise per Session: Not on file  Stress:   . Feeling of Stress : Not on file  Social Connections:   . Frequency of Communication with Friends and Family: Not on file  . Frequency of Social Gatherings with Friends and Family: Not on file  . Attends Religious Services: Not on file  . Active Member of Clubs or Organizations: Not on file  . Attends Archivist Meetings: Not on file  . Marital Status: Not on file    Review of Systems: Gen: Denies fever, chills, anorexia. Denies fatigue, weakness, weight loss.  CV: Denies chest pain, palpitations, syncope, peripheral edema, and claudication. Resp: Denies dyspnea at rest, cough, wheezing, coughing up blood, and pleurisy. GI: see  HPI Derm: Denies rash, itching, dry skin Psych: Denies depression, anxiety, memory loss, confusion. No homicidal or suicidal ideation.  Heme: Denies bruising, bleeding, and enlarged lymph nodes.  Physical Exam: BP 140/79   Pulse 76   Temp (!) 97.1 F (36.2 C) (Oral)   Ht 5\' 6"  (1.676 m)   Wt 187 lb 3.2 oz (84.9 kg)   BMI 30.21 kg/m  General:   Alert and oriented. No distress noted. Pleasant and cooperative.  Head:  Normocephalic and atraumatic. Eyes:  Conjuctiva clear without scleral icterus. Mouth:  Mask in place Abdomen:  +BS, soft, non-tender and non-distended. No rebound or guarding. No HSM or masses noted. Msk:  Symmetrical without gross deformities. Normal posture. Extremities:  Without edema. Neurologic:  Alert and  oriented x4 Psych:  Alert and cooperative. Normal mood and affect.  ASSESSMENT: JAHNIYAH REVERE is a 79 y.o. female presenting today with history of gastric leiomyoma s/p EUS in March 2021 (10 mm X 10 mm), recently inpatient July 2021 with concerns for melena but had taken pepto prior to admission. EGD completed overall unrevealing with known leiomyoma and capsule study without obvious lesion or bleeding.   Continues with GERD symptoms and off PPI. We will resume Protonix daily. Fleeting upper abdominal discomfort is rare and improved on PPI therapy. Will need to determine timing of next EUS and request outside office notes from Dr. Paulita Fujita.    PLAN:  Resume Protonix once daily  Return in 6 months to see Dr. Abbey Barajas  Request outside office notes from Dr. Janit Pagan, PhD, [facility] Gastroenterology

## 2020-05-26 NOTE — Patient Instructions (Signed)
Please resume taking pantoprazole (Protonix) once each morning, 30 minutes before breakfast. It is best absorbed on an empty stomach.  Please call if any recurrent pain, any black stools, nausea, vomiting, weight loss.   We will see you in 6 months!   I enjoyed seeing you again today! As you know, I value our relationship and want to provide genuine, compassionate, and quality care. I welcome your feedback. If you receive a survey regarding your visit,  I greatly appreciate you taking time to fill this out. See you next time!  Annitta Needs, PhD, ANP-BC [facility] Gastroenterology

## 2020-05-26 NOTE — Progress Notes (Signed)
Cc'ed to pcp °

## 2020-05-26 NOTE — Telephone Encounter (Signed)
Melissa Barajas, can we get office notes from Dr. Paulita Fujita? He did an EUS in March 2021 (which I have), but I believe she was seen in follow-up thereafter.

## 2020-05-26 NOTE — Telephone Encounter (Signed)
Requested records.

## 2020-06-04 ENCOUNTER — Telehealth: Payer: Self-pay | Admitting: Family Medicine

## 2020-06-04 NOTE — Telephone Encounter (Signed)
Pt would like to know if Dr. Nicki Reaper recommends her getting the 3rd vaccine. It has been 6 months since she received the 2nd dose.

## 2020-06-06 NOTE — Telephone Encounter (Signed)
Please let patient know that I definitely recommend the third dose of [employer]

## 2020-06-07 NOTE — Telephone Encounter (Signed)
Patient notified and verbalized understanding. 

## 2020-06-08 DIAGNOSIS — H35033 Hypertensive retinopathy, bilateral: Secondary | ICD-10-CM | POA: Diagnosis not present

## 2020-06-10 ENCOUNTER — Ambulatory Visit: Payer: Medicare Other | Attending: Internal Medicine

## 2020-06-10 DIAGNOSIS — Z23 Encounter for immunization: Secondary | ICD-10-CM

## 2020-06-10 NOTE — Progress Notes (Signed)
   Covid-19 Vaccination Clinic  Name:  Melissa Barajas    MRN: [id number] DOB: 04-11-1941  06/10/2020  Melissa Barajas was observed post Covid-19 immunization for 15 minutes without incident. She was provided with Vaccine Information Sheet and instruction to access the V-Safe system.   Melissa Barajas was instructed to call 911 with any severe reactions post vaccine: Marland Kitchen Difficulty breathing  . Swelling of face and throat  . A fast heartbeat  . A bad rash all over body  . Dizziness and weakness

## 2020-06-24 DIAGNOSIS — Z23 Encounter for immunization: Secondary | ICD-10-CM | POA: Diagnosis not present

## 2020-07-05 ENCOUNTER — Telehealth: Payer: Self-pay

## 2020-07-05 NOTE — Telephone Encounter (Signed)
Pt is wanting to see if Dr Nicki Reaper can call in a antiinflammatory the Tylenol is not helping the Pain in alkalies heel going on 4 week.  Pt call back [phone]

## 2020-07-06 NOTE — Telephone Encounter (Signed)
The unfortunate challenge for this patient is that she has elevated creatinine on her recent lab work.  Taking the anti-inflammatory would only cause her kidneys to get worse and could threaten the ability of her kidneys to function.  Therefore we cannot do a anti-inflammatory it would be bad medicine and could hurt her  As for the Tylenol how she currently taking the Tylenol?  What milligram and how many at a time and how often?  Where is her pain primarily?  Thank you

## 2020-07-07 NOTE — Telephone Encounter (Signed)
Patient states she feels like it may finally be turning the corner and it is feeling better today and she can walk better today so she will wait and if it gets worse she will schedule an office visit with Dr Nicki Reaper.

## 2020-07-07 NOTE — Telephone Encounter (Signed)
So we can do a short course of prednisone and that can help in some situations if that does not help enough next step would be orthopedic referral.  Definitely I do not recommend anti-inflammatories such as diclofenac Naprosyn etc. because it can cause kidney to get worse  If she is interested in trying the prednisone I would recommend the following Prednisone 20 mg 2 pills daily for 4 days then 1 pill a day for 4 days, #12 If ongoing troubles notify us we can always help with the referral or a follow-up office visit if she prefers

## 2020-07-07 NOTE — Telephone Encounter (Signed)
Patient notified and stated she is doing Tylenol ES 2 tabs every 4 hours for her achilles heel pain- normally when it acts up she can take anti inflammatory and knock it out in a few weeks but she has had it for 4 weeks. Patient states it is doing a little better today

## 2020-08-01 ENCOUNTER — Other Ambulatory Visit: Payer: Self-pay | Admitting: Family Medicine

## 2020-08-16 DIAGNOSIS — N6012 Diffuse cystic mastopathy of left breast: Secondary | ICD-10-CM | POA: Diagnosis not present

## 2020-08-16 DIAGNOSIS — R928 Other abnormal and inconclusive findings on diagnostic imaging of breast: Secondary | ICD-10-CM | POA: Diagnosis not present

## 2020-08-16 DIAGNOSIS — N6001 Solitary cyst of right breast: Secondary | ICD-10-CM | POA: Diagnosis not present

## 2020-08-24 ENCOUNTER — Ambulatory Visit (INDEPENDENT_AMBULATORY_CARE_PROVIDER_SITE_OTHER): Payer: Medicare Other | Admitting: Family Medicine

## 2020-08-24 ENCOUNTER — Encounter: Payer: Self-pay | Admitting: Family Medicine

## 2020-08-24 VITALS — BP 142/80 | HR 86 | Temp 97.5°F | Ht 66.0 in | Wt 190.0 lb

## 2020-08-24 DIAGNOSIS — M2550 Pain in unspecified joint: Secondary | ICD-10-CM

## 2020-08-24 DIAGNOSIS — N1831 Chronic kidney disease, stage 3a: Secondary | ICD-10-CM

## 2020-08-24 DIAGNOSIS — M069 Rheumatoid arthritis, unspecified: Secondary | ICD-10-CM | POA: Diagnosis not present

## 2020-08-24 DIAGNOSIS — I10 Essential (primary) hypertension: Secondary | ICD-10-CM

## 2020-08-24 MED ORDER — TRAMADOL HCL 50 MG PO TABS: ORAL_TABLET | ORAL | 0 refills | Status: DC

## 2020-08-24 MED ORDER — CLONIDINE HCL 0.1 MG PO TABS
0.1000 mg | ORAL_TABLET | Freq: Two times a day (BID) | ORAL | 5 refills | Status: DC
Start: 2020-08-24 — End: 2020-11-05

## 2020-08-24 NOTE — Progress Notes (Signed)
   Subjective:    Patient ID: Melissa Barajas, female    DOB: 03-Sep-1941, 79 y.o.   MRN: [id number]  HPImed check up.   Pt states since stopping her arthritis med she has been suffering in pain. Taking tylenol and it is not helping.   Essential hypertension - Plan: Basic metabolic panel, C-reactive protein, Rheumatoid factor  Arthralgia, unspecified joint - Plan: Basic metabolic panel, C-reactive protein, Rheumatoid factor  Chronic kidney disease, stage 3a ([place])  Patient with chronic kidney disease the best way to keep this under control is watch diet stay active keep blood pressure under good control Takes her cholesterol medicine on a regular basis Does have some joint pains and arthralgias unfortunately has a lot of discomfort in her joints since stopping anti-inflammatories but she understands that she cannot take those because of her kidneys  Review of Systems     Objective:   Physical Exam Lungs clear heart regular pulse normal what appears to be osteoarthritis of the knees ankles and hands are noted   No swelling but blood pressure is elevated    Assessment & Plan:  1. Essential hypertension Blood pressure not at goal I would recommend adjusting Add clonidine twice daily If progressive symptoms or problems next step would be potentially renal artery ultrasound Follow-up in 4 weeks continue other medicines - Basic metabolic panel - C-reactive protein - Rheumatoid factor  2. Arthralgia, unspecified joint Check lab work to make sure there is not a rheumatologic issue but I believe this is most likely osteoarthritis - Basic metabolic panel - C-reactive protein - Rheumatoid factor  Chronic kidney disease more than likely due to blood pressure No longer taking anti-inflammatories Rechecking metabolic 7 to see how it is doing currently Stick with vegetables fruits lean meats avoid meats more than 4 ounces drink plenty of water daily Time spent with patient discussing her  blood pressure discussing kidney function and discussing arthralgias.  And safe use of medications

## 2020-08-25 ENCOUNTER — Ambulatory Visit: Payer: Medicare Other | Admitting: Gastroenterology

## 2020-08-25 LAB — BASIC METABOLIC PANEL
BUN/Creatinine Ratio: 16 (ref 12–28)
BUN: 13 mg/dL (ref 8–27)
CO2: 23 mmol/L (ref 20–29)
Calcium: 10.2 mg/dL (ref 8.7–10.3)
Chloride: 101 mmol/L (ref 96–106)
Creatinine, Ser: 0.81 mg/dL (ref 0.57–1.00)
GFR calc Af Amer: 81 mL/min/{1.73_m2} (ref >59)
GFR calc non Af Amer: 70 mL/min/{1.73_m2} (ref >59)
Glucose: 105 mg/dL — ABNORMAL HIGH (ref 65–99)
Potassium: 4.3 mmol/L (ref 3.5–5.2)
Sodium: 140 mmol/L (ref 134–144)

## 2020-08-25 LAB — C-REACTIVE PROTEIN: CRP: 6 mg/L (ref 0–10)

## 2020-08-25 LAB — RHEUMATOID FACTOR: Rheumatoid fact SerPl-aCnc: <10 IU/mL (ref <14.0)

## 2020-09-02 ENCOUNTER — Other Ambulatory Visit: Payer: Medicare Other

## 2020-09-02 DIAGNOSIS — Z20822 Contact with and (suspected) exposure to covid-19: Secondary | ICD-10-CM | POA: Diagnosis not present

## 2020-09-04 LAB — NOVEL CORONAVIRUS, NAA: SARS-CoV-2, NAA: NOT DETECTED

## 2020-09-04 LAB — SARS-COV-2, NAA 2 DAY TAT

## 2020-09-08 ENCOUNTER — Telehealth: Payer: Self-pay | Admitting: *Deleted

## 2020-09-08 NOTE — Telephone Encounter (Signed)
Dr Lorin Picket requested breast u/s report.  I called [facility] and requested it and they did send it and I put in in dr scott's box along with mammo report.

## 2020-09-11 NOTE — Telephone Encounter (Signed)
Please make sure that this result is abstracted into the system/scanned looks fine thank you

## 2020-09-13 NOTE — Telephone Encounter (Signed)
Please scan into chart.

## 2020-09-14 ENCOUNTER — Telehealth: Payer: Self-pay

## 2020-09-14 ENCOUNTER — Other Ambulatory Visit: Payer: Self-pay | Admitting: *Deleted

## 2020-09-14 MED ORDER — POTASSIUM CHLORIDE CRYS ER 10 MEQ PO TBCR: 10.0000 meq | EXTENDED_RELEASE_TABLET | Freq: Every day | ORAL | 1 refills | Status: DC

## 2020-09-14 NOTE — Telephone Encounter (Signed)
Pt needs refill on potassium chloride (KLOR-CON) 10 MEQ tablet send to [facility] [place], [place] - [address] [place], [address]  [address] [place], [address], [place] [address]   Pt call back [phone]

## 2020-09-14 NOTE — Telephone Encounter (Signed)
Refills sent per protocol. Pt notified

## 2020-09-22 ENCOUNTER — Ambulatory Visit: Payer: Medicare Other | Admitting: Family Medicine

## 2020-10-04 ENCOUNTER — Telehealth: Payer: Self-pay | Admitting: Family Medicine

## 2020-10-04 NOTE — Telephone Encounter (Signed)
Called [facility] and gave verbal since pt did not receive med. Tried to call pt and no answer

## 2020-10-04 NOTE — Telephone Encounter (Signed)
Patient states [facility] has not sent her medication yet instead they sent her a letter stating that they need approval from her primary care doctor before they will ship her potassium chloride 10 mg . Please advise

## 2020-10-05 NOTE — Telephone Encounter (Signed)
Tried to call no answer

## 2020-10-06 NOTE — Telephone Encounter (Signed)
Pt.notified

## 2020-10-07 ENCOUNTER — Ambulatory Visit: Payer: Medicare Other | Admitting: Gastroenterology

## 2020-10-20 ENCOUNTER — Other Ambulatory Visit: Payer: Self-pay | Admitting: Family Medicine

## 2020-11-01 ENCOUNTER — Encounter: Payer: Self-pay | Admitting: Internal Medicine

## 2020-11-05 ENCOUNTER — Other Ambulatory Visit: Payer: Self-pay | Admitting: *Deleted

## 2020-11-05 MED ORDER — CLONIDINE HCL 0.1 MG PO TABS: 0.1000 mg | ORAL_TABLET | Freq: Two times a day (BID) | ORAL | 1 refills | Status: DC

## 2020-11-08 ENCOUNTER — Other Ambulatory Visit: Payer: Self-pay | Admitting: Family Medicine

## 2020-11-08 NOTE — Telephone Encounter (Signed)
90-day with 1 additional refill

## 2020-11-11 ENCOUNTER — Ambulatory Visit: Payer: Medicare Other | Admitting: Family Medicine

## 2020-12-03 ENCOUNTER — Other Ambulatory Visit: Payer: Self-pay

## 2020-12-03 ENCOUNTER — Ambulatory Visit (INDEPENDENT_AMBULATORY_CARE_PROVIDER_SITE_OTHER): Payer: Medicare Other | Admitting: Family Medicine

## 2020-12-03 ENCOUNTER — Encounter: Payer: Self-pay | Admitting: Family Medicine

## 2020-12-03 VITALS — BP 138/74 | HR 63 | Temp 97.8°F | Ht 66.0 in | Wt 186.0 lb

## 2020-12-03 DIAGNOSIS — Z79899 Other long term (current) drug therapy: Secondary | ICD-10-CM

## 2020-12-03 DIAGNOSIS — R000 Tachycardia, unspecified: Secondary | ICD-10-CM

## 2020-12-03 DIAGNOSIS — E785 Hyperlipidemia, unspecified: Secondary | ICD-10-CM | POA: Diagnosis not present

## 2020-12-03 DIAGNOSIS — M1991 Primary osteoarthritis, unspecified site: Secondary | ICD-10-CM | POA: Diagnosis not present

## 2020-12-03 DIAGNOSIS — I10 Essential (primary) hypertension: Secondary | ICD-10-CM

## 2020-12-03 MED ORDER — DICLOFENAC SODIUM 50 MG PO TBEC: DELAYED_RELEASE_TABLET | ORAL | 1 refills | Status: DC

## 2020-12-03 NOTE — Patient Instructions (Signed)
Please watch your heart rhythm issues.  If they start occurring frequently or prolonged it is important to let us know.  We will see you in approximately 6 months [facility]-Dr. Nicki Reaper

## 2020-12-03 NOTE — Progress Notes (Signed)
   Subjective:    Patient ID: Melissa Barajas, female    DOB: April 25, 1941, 80 y.o.   MRN: [id number]  HPImed check up. Pt states no concerns today.  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  That seems to be safe at that dosing but recommend no more than that  Diclofenac we did discuss pros and cons     Review of Systems  Constitutional: Negative for activity change and appetite change.  HENT: Negative for congestion and rhinorrhea.   Respiratory: Negative for cough and shortness of breath.   Cardiovascular: Negative for chest pain and leg swelling.  Gastrointestinal: Negative for abdominal pain, nausea and vomiting.  Skin: Negative for color change.  Neurological: Negative for dizziness and weakness.  Psychiatric/Behavioral: Negative for agitation and confusion.   She does state that she had some spells where her heart was running fast lasted for several minutes then went away did not cause nausea dizziness or passing out.  Denies any chest pressure or tightness with    Objective:   Physical Exam Lungs clear heart regular pulse normal extremities no edema       Assessment & Plan:  Very nice patient 1. Primary osteoarthritis, unspecified site She will use Voltaren but at a reduced dose of 50 mg no more than twice daily use sparingly because we want to preserve kidney function most recent metabolic 7 look good  2. Tachycardia Patient has intermittent spells of tachycardia we will EKG today looks good no acute changes compared to previous thyroid function will be checked as well - TSH - T4, free - EKG 12-Lead  3. Essential  hypertension Blood pressure good control continue current measures watch diet  4. Hyperlipidemia, unspecified hyperlipidemia type Cholesterol will be checked watch diet - Lipid panel  5. High risk medication use Liver function will be checked - Hepatic function panel

## 2020-12-20 ENCOUNTER — Telehealth: Payer: Self-pay | Admitting: Family Medicine

## 2020-12-20 NOTE — Telephone Encounter (Signed)
error 

## 2021-01-07 NOTE — Progress Notes (Addendum)
Subjective:   Melissa Barajas is a 80 y.o. female who presents for an Initial Medicare Annual Wellness Visit. I have reviewed and agree with the above annual wellness visit documentation Sallee Lange MD [facility]  I connected with Antony Blackbird  today by telephone and verified that I am speaking with the correct person using two identifiers. Location patient: home Location provider: work Persons participating in the virtual visit: patient, provider.   I discussed the limitations, risks, security and privacy concerns of performing an evaluation and management service by telephone and the availability of in person appointments. I also discussed with the patient that there may be a patient responsible charge related to this service. The patient expressed understanding and verbally consented to this telephonic visit.    Interactive audio and video telecommunications were attempted between this provider and patient, however failed, due to patient having technical difficulties OR patient did not have access to video capability.  We continued and completed visit with audio only.      Review of Systems    N/A  Cardiac Risk Factors include: advanced age (>56men, >97 women);dyslipidemia;hypertension     Objective:    Today's Vitals   01/11/21 0942  PainSc: 8    There is no height or weight on file to calculate BMI.  Advanced Directives 01/11/2021 04/05/2020 04/05/2020 04/05/2020 10/24/2019 06/14/2019 09/05/2018  Does Patient Have a Medical Advance Directive? No No No No No No No  Would patient like information on creating a medical advance directive? No - Patient declined No - Patient declined Yes (MAU/Ambulatory/Procedural Areas - Information given) Yes (ED - Information included in AVS) No - Patient declined - -    Current Medications (verified) Outpatient Encounter Medications as of 01/11/2021  Medication Sig   albuterol (VENTOLIN HFA) 108 (90 Base) MCG/ACT inhaler Inhale 2  puffs into the lungs every 4 (four) hours as needed for wheezing or shortness of breath.   Ascorbic Acid (VITAMIN C) 1000 MG tablet Take 1,000 mg by mouth daily.   Cholecalciferol (VITAMIN D3) 50 MCG (2000 UT) TABS Take 2,000 Units by mouth daily.   cloNIDine (CATAPRES) 0.1 MG tablet Take 1 tablet (0.1 mg total) by mouth 2 (two) times daily.   diclofenac (VOLTAREN) 50 MG EC tablet One bid prn   diltiazem (CARDIZEM CD) 360 MG 24 hr capsule TAKE 1 CAPSULE BY MOUTH  DAILY   famotidine (PEPCID) 40 MG tablet TAKE 1 TABLET BY MOUTH  DAILY   fluticasone furoate-vilanterol (BREO ELLIPTA) 200-25 MCG/INH AEPB USE 1 INHALATION BY MOUTH  DAILY   Phenazopyridine HCl (AZO TABS PO) Take by mouth.   Polyethyl Glycol-Propyl Glycol (SYSTANE OP) Place 2 drops into both eyes 2 (two) times daily.    potassium chloride (KLOR-CON) 10 MEQ tablet Take 1 tablet (10 mEq total) by mouth daily.   rosuvastatin (CRESTOR) 20 MG tablet TAKE 1 TABLET BY MOUTH  DAILY   Specialty Vitamins Products (BRAIN PO) Take 1 capsule by mouth daily. FOREBRAIN COGNITIVE PERFORMANCE SUPPLEMENT   chlorthalidone (HYGROTON) 25 MG tablet TAKE 1 TABLET BY MOUTH  DAILY (Patient not taking: Reported on 01/11/2021)   traMADol (ULTRAM) 50 MG tablet Take one tid prn (Patient not taking: No sig reported)   [DISCONTINUED] Coenzyme Q10-Vitamin E (QUNOL ULTRA COQ10 PO) Take 5 mLs by mouth daily.   No facility-administered encounter medications on file as of 01/11/2021.    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 screening colonoscopy in 10 years 2. She should follow a high fiber diet She has been given a hand out on high fiber diverticulosois and hemorrhoids   COLONOSCOPY N/A 02/07/2018   Procedure: COLONOSCOPY;  Surgeon: Danie Binder, MD;  [facility];  Service: Endoscopy;  Laterality: N/A;  1:00pm   CYSTECTOMY     Left breast   ESOPHAGOGASTRODUODENOSCOPY     RSW:[id number] appearing Schatzki's ring, possible cervical esophageal web, both dilated with passage of a Maloney dilator/small hiatal hernia   ESOPHAGOGASTRODUODENOSCOPY N/A 10/25/2015   Procedure: ESOPHAGOGASTRODUODENOSCOPY (EGD);  Surgeon: Danie Binder, MD;  [facility];  Service: Endoscopy;  Laterality: N/A;  1215pm   ESOPHAGOGASTRODUODENOSCOPY N/A 10/24/2019   Schatzki's ring s/p dilation and single medium firm, probable submucosal nodule without bleeding. Medium sized hiatal hernia, normal duodenum.    ESOPHAGOGASTRODUODENOSCOPY N/A 04/05/2020   10 mm leiomyoma and capsule study without obvious lesion or bleeding.   EUS  11/2019   Dr. Paulita Fujita: EUS completed March 2021 with likely leiomyoma, no other concerns. 10 mm by 10 mm.    GIVENS CAPSULE STUDY N/A 04/06/2020   Procedure: GIVENS CAPSULE STUDY;  Surgeon: Rogene Houston, MD;  [facility];  Service: Endoscopy;  Laterality: N/A;   POLYPECTOMY  02/07/2018   Procedure: POLYPECTOMY;  Surgeon: Danie Binder, MD;  [facility];  Service: Endoscopy;;  ascending colon, descending, sigmoid   SAVORY DILATION N/A 10/25/2015   Procedure: SAVORY DILATION;  Surgeon: Danie Binder,  MD;  [facility];  Service: Endoscopy;  Laterality: N/A;   SAVORY DILATION N/A 10/24/2019   Procedure: SAVORY DILATION;  Surgeon: Danie Binder, MD;  [facility];  Service: Endoscopy;  Laterality: N/A;   THYROIDECTOMY     Tumor   05/2010   Left breast under nipple   VESICOVAGINAL FISTULA CLOSURE W/ TAH     fibroid tumors   Family History  Problem Relation Age of Onset   Cancer Brother        Prostate   Social History   Socioeconomic History   Marital status: Married    Spouse name: Not on file   Number of children: Not on file   Years of education: Not on file   Highest education level: Not on file  Occupational History   Not on file  Tobacco Use   Smoking status: Former Smoker    Packs/day: 1.00    Years: 15.00    Pack years: 15.00    Types: Cigarettes    Start date: 09/12/1983    Quit date: 09/11/1993  Years since quitting: 27.3   Smokeless tobacco: Never Used  Vaping Use   Vaping Use: Never used  Substance and Sexual Activity   Alcohol use: No    Alcohol/week: 0.0 standard drinks   Drug use: No   Sexual activity: Not on file  Other Topics Concern   Not on file  Social History Narrative   Not on file   Social Determinants of Health   Financial Resource Strain: Low Risk    Difficulty of Paying Living Expenses: Not hard at all  Food Insecurity: No Food Insecurity   Worried About Charity fundraiser in the Last Year: Never true   [place] in the Last Year: Never true  Transportation Needs: No Transportation Needs   Lack of Transportation (Medical): No   Lack of Transportation (Non-Medical): No  Physical Activity: Inactive   Days of Exercise per Week: 0 days   Minutes of Exercise per Session: 0 min  Stress: No Stress Concern Present   Feeling of Stress : Not at all  Social Connections: Moderately Integrated   Frequency of Communication with Friends and Family: More than three times a week   Frequency of Social Gatherings with  Friends and Family: Three times a week   Attends Religious Services: More than 4 times per year   Active Member of Clubs or Organizations: Yes   Attends Archivist Meetings: More than 4 times per year   Marital Status: Widowed    Tobacco Counseling Counseling given: Not Answered   Clinical Intake:  Pre-visit preparation completed: Yes  Pain : 0-10 Pain Score: 8  Pain Type: Chronic pain Pain Location: Foot Pain Orientation: Right Pain Descriptors / Indicators: Aching Pain Onset: More than a month ago Pain Frequency: Constant Pain Relieving Factors: pain cream  Pain Relieving Factors: pain cream  Nutritional Risks: None Diabetes: No  How often do you need to have someone help you when you read instructions, pamphlets, or other written materials from your doctor or pharmacy?: 1 - Never  Diabetic?No   Interpreter Needed?: No  Information entered by :: [place] of Daily Living In your present state of health, do you have any difficulty performing the following activities: 01/11/2021 04/05/2020  Hearing? N -  Vision? Y -  Comment occassional has blurred vision even when wearing glasses -  Difficulty concentrating or making decisions? Y -  Comment has some issues with short term memory -  Walking or climbing stairs? N -  Dressing or bathing? N -  Doing errands, shopping? N N  Preparing Food and eating ? N -  Using the Toilet? N -  In the past six months, have you accidently leaked urine? N -  Do you have problems with loss of bowel control? N -  Managing your Medications? N -  Managing your Finances? N -  Housekeeping or managing your Housekeeping? N -  Some recent data might be hidden    Patient Care Team: Kathyrn Drown, MD as PCP - General (Family Medicine) Danie Binder, MD (Inactive) as Attending Physician (Gastroenterology) Eloise Harman, DO as Consulting Physician (Internal Medicine)  Indicate any recent Medical Services you  may have received from other than [facility] providers in the past year (date may be approximate).     Assessment:   This is a routine wellness examination for Melissa Barajas.  Hearing/Vision screen  Hearing Screening   125Hz  250Hz  500Hz  1000Hz  2000Hz  3000Hz  4000Hz  6000Hz  8000Hz   Right ear:  Left ear:           Vision Screening Comments: Patient states gets eyes examined once per year. Currently wearing glasses   Dietary issues and exercise activities discussed: Current Exercise Habits: The patient does not participate in regular exercise at present, Exercise limited by: Other - see comments (arthritis)  Goals      DIET - REDUCE SUGAR INTAKE     Prevent falls       Depression Screen PHQ 2/9 Scores 01/11/2021 12/03/2020 08/24/2020 04/29/2020 01/03/2018 11/03/2016  PHQ - 2 Score 0 0 0 0 0 0  PHQ- 9 Score - - - - 3 -    Fall Risk Fall Risk  01/11/2021 12/03/2020 08/24/2020 02/03/2020 09/22/2019  Falls in the past year? 0 0 0 1 0  Comment - - - - -  Number falls in past yr: 0 - - 0 -  Comment - - - - -  Injury with Fall? 0 - - 0 -  Risk for fall due to : No Fall Risks - - - -  Follow up Falls evaluation completed;Falls prevention discussed Falls evaluation completed Falls evaluation completed Falls evaluation completed;Education provided Falls evaluation completed    FALL RISK PREVENTION PERTAINING TO THE HOME:  Any stairs in or around the home? Yes  If so, are there any without handrails? No  Home free of loose throw rugs in walkways, pet beds, electrical cords, etc? Yes  Adequate lighting in your home to reduce risk of falls? Yes   ASSISTIVE DEVICES UTILIZED TO PREVENT FALLS:  Life alert? No  Use of a cane, walker or w/c? Yes  Grab bars in the bathroom? Yes  Shower chair or bench in shower? Yes  Elevated toilet seat or a handicapped toilet? Yes     Cognitive Function:   Montreal Cognitive Assessment  10/30/2017  Visuospatial/ Executive (0/5) 3  Naming (0/3) 3  Attention: Read  list of digits (0/2) 2  Attention: Read list of letters (0/1) 1  Attention: Serial 7 subtraction starting at 100 (0/3) 0  Language: Repeat phrase (0/2) 2  Language : Fluency (0/1) 1  Abstraction (0/2) 2  Delayed Recall (0/5) 3  Orientation (0/6) 6  Total 23  Adjusted Score (based on education) 24   6CIT Screen 01/11/2021  What Year? 0 points  What month? 0 points  What time? 0 points  Count back from 20 0 points  Months in reverse 0 points  Repeat phrase 0 points  Total Score 0    Immunizations Immunization History  Administered Date(s) Administered   Influenza Split 07/08/2013   Influenza, High Dose Seasonal PF 06/16/2019   Influenza,inj,Quad PF,6+ Mos 05/25/2014, 06/15/2015, 06/22/2016, 06/19/2017, 06/21/2018   Influenza,inj,quad, With Preservative 06/11/2017   Influenza-Unspecified 05/13/2011, 06/25/2020   PFIZER(Purple Top)SARS-COV-2 Vaccination 10/01/2019, 10/22/2019, 06/10/2020   Pneumococcal Conjugate-13 10/12/2014   Pneumococcal Polysaccharide-23 07/01/2008   Zoster 07/01/2008    TDAP status: Due, Education has been provided regarding the importance of this vaccine. Advised may receive this vaccine at local pharmacy or Health Dept. Aware to provide a copy of the vaccination record if obtained from local pharmacy or Health Dept. Verbalized acceptance and understanding.  Flu Vaccine status: Up to date  Pneumococcal vaccine status: Up to date  Covid-19 vaccine status: Completed vaccines  Qualifies for Shingles Vaccine? Yes   Zostavax completed Yes   Shingrix Completed?: No.    Education has been provided regarding the importance of this vaccine. Patient has been advised to call insurance  company to determine out of pocket expense if they have not yet received this vaccine. Advised may also receive vaccine at local pharmacy or Health Dept. Verbalized acceptance and understanding.  Screening Tests Health Maintenance  Topic Date Due   Hepatitis C Screening  Never  done   TETANUS/TDAP  Never done   INFLUENZA VACCINE  04/11/2021   DEXA SCAN  Completed   COVID-19 Vaccine  Completed   PNA vac Low Risk Adult  Completed   HPV VACCINES  Aged Out    Health Maintenance  Health Maintenance Due  Topic Date Due   Hepatitis C Screening  Never done   TETANUS/TDAP  Never done    Colorectal cancer screening: No longer required.   Mammogram status: Completed 08/16/2020. Repeat every year  Bone Density status: Completed 10/28/2015. Results reflect: Bone density results: NORMAL. Repeat every 0 years.  Lung Cancer Screening: (Low Dose CT Chest recommended if Age 26-80 years, 30 pack-year currently smoking OR have quit w/in 15years.) does not qualify.   Lung Cancer Screening Referral: N/A   Additional Screening:  Hepatitis C Screening: does qualify;   Vision Screening: Recommended annual ophthalmology exams for early detection of glaucoma and other disorders of the eye. Is the patient up to date with their annual eye exam?  Yes  Who is the provider or what is the name of the office in which the patient attends annual eye exams? Dr.Cotter If pt is not established with a provider, would they like to be referred to a provider to establish care? No .   Dental Screening: Recommended annual dental exams for proper oral hygiene  Community Resource Referral / Chronic Care Management: CRR required this visit?  No   CCM required this visit?  No      Plan:     I have personally reviewed and noted the following in the patient's chart:   Medical and social history Use of alcohol, tobacco or illicit drugs  Current medications and supplements Functional ability and status Nutritional status Physical activity Advanced directives List of other physicians Hospitalizations, surgeries, and ER visits in previous 12 months Vitals Screenings to include cognitive, depression, and falls Referrals and appointments  In addition, I have reviewed and discussed  with patient certain preventive protocols, quality metrics, and best practice recommendations. A written personalized care plan for preventive services as well as general preventive health recommendations were provided to patient.     Ofilia Neas, LPN   [id number]   Nurse Notes: None

## 2021-01-11 ENCOUNTER — Ambulatory Visit (INDEPENDENT_AMBULATORY_CARE_PROVIDER_SITE_OTHER): Payer: Medicare Other

## 2021-01-11 ENCOUNTER — Ambulatory Visit: Payer: Medicare Other

## 2021-01-11 DIAGNOSIS — Z0000 Encounter for general adult medical examination without abnormal findings: Secondary | ICD-10-CM

## 2021-01-11 NOTE — Patient Instructions (Signed)
Melissa Barajas , Thank you for taking time to come for your Medicare Wellness Visit. I appreciate your ongoing commitment to your health goals. Please review the following plan we discussed and let me know if I can assist you in the future.   Screening recommendations/referrals: Colonoscopy: No longer required  Mammogram: Up to date., next due 08/31/2021 Bone Density: No longer required  Recommended yearly ophthalmology/optometry visit for glaucoma screening and checkup Recommended yearly dental visit for hygiene and checkup  Vaccinations: Influenza vaccine: Up to date, next due fall 2022  Pneumococcal vaccine: Completed series  Tdap vaccine: Currently due, you may await and injury to receive  Shingles vaccine: Currently due for Shingrix, if you would like to receive we recommend that you do so at your local pharmacy.     Advanced directives: Advance directive discussed with you today. Even though you declined this today please call our office should you change your mind and we can give you the proper paperwork for you to fill out.   Conditions/risks identified: None   Next appointment: 01/17/2022 @ 9:40 am via telephone with [place] 78 Years and Older, Female Preventive care refers to lifestyle choices and visits with your health care provider that can promote health and wellness. What does preventive care include?  A yearly physical exam. This is also called an annual well check.  Dental exams once or twice a year.  Routine eye exams. Ask your health care provider how often you should have your eyes checked.  Personal lifestyle choices, including:  Daily care of your teeth and gums.  Regular physical activity.  Eating a healthy diet.  Avoiding tobacco and drug use.  Limiting alcohol use.  Practicing safe sex.  Taking low-dose aspirin every day.  Taking vitamin and mineral supplements as recommended by your health care provider. What  happens during an annual well check? The services and screenings done by your health care provider during your annual well check will depend on your age, overall health, lifestyle risk factors, and family history of disease. Counseling  Your health care provider may ask you questions about your:  Alcohol use.  Tobacco use.  Drug use.  Emotional well-being.  Home and relationship well-being.  Sexual activity.  Eating habits.  History of falls.  Memory and ability to understand (cognition).  Work and work Statistician.  Reproductive health. Screening  You may have the following tests or measurements:  Height, weight, and BMI.  Blood pressure.  Lipid and cholesterol levels. These may be checked every 5 years, or more frequently if you are over 23 years old.  Skin check.  Lung cancer screening. You may have this screening every year starting at age 81 if you have a 30-pack-year history of smoking and currently smoke or have quit within the past 15 years.  Fecal occult blood test (FOBT) of the stool. You may have this test every year starting at age 89.  Flexible sigmoidoscopy or colonoscopy. You may have a sigmoidoscopy every 5 years or a colonoscopy every 10 years starting at age 34.  Hepatitis C blood test.  Hepatitis B blood test.  Sexually transmitted disease (STD) testing.  Diabetes screening. This is done by checking your blood sugar (glucose) after you have not eaten for a while (fasting). You may have this done every 1-3 years.  Bone density scan. This is done to screen for osteoporosis. You may have this done starting at age 32.  Mammogram. This may  be done every 1-2 years. Talk to your health care provider about how often you should have regular mammograms. Talk with your health care provider about your test results, treatment options, and if necessary, the need for more tests. Vaccines  Your health care provider may recommend certain vaccines, such  as:  Influenza vaccine. This is recommended every year.  Tetanus, diphtheria, and acellular pertussis (Tdap, Td) vaccine. You may need a Td booster every 10 years.  Zoster vaccine. You may need this after age 26.  Pneumococcal 13-valent conjugate (PCV13) vaccine. One dose is recommended after age 2.  Pneumococcal polysaccharide (PPSV23) vaccine. One dose is recommended after age 60. Talk to your health care provider about which screenings and vaccines you need and how often you need them. This information is not intended to replace advice given to you by your health care provider. Make sure you discuss any questions you have with your health care provider. Document Released: 09/24/2015 Document Revised: 05/17/2016 Document Reviewed: 06/29/2015 Elsevier Interactive Patient Education  2017 [place] Prevention in the Home Falls can cause injuries. They can happen to people of all ages. There are many things you can do to make your home safe and to help prevent falls. What can I do on the outside of my home?  Regularly fix the edges of walkways and driveways and fix any cracks.  Remove anything that might make you trip as you walk through a door, such as a raised step or threshold.  Trim any bushes or trees on the path to your home.  Use bright outdoor lighting.  Clear any walking paths of anything that might make someone trip, such as rocks or tools.  Regularly check to see if handrails are loose or broken. Make sure that both sides of any steps have handrails.  Any raised decks and porches should have guardrails on the edges.  Have any leaves, snow, or ice cleared regularly.  Use sand or salt on walking paths during winter.  Clean up any spills in your garage right away. This includes oil or grease spills. What can I do in the bathroom?  Use night lights.  Install grab bars by the toilet and in the tub and shower. Do not use towel bars as grab bars.  Use  non-skid mats or decals in the tub or shower.  If you need to sit down in the shower, use a plastic, non-slip stool.  Keep the floor dry. Clean up any water that spills on the floor as soon as it happens.  Remove soap buildup in the tub or shower regularly.  Attach bath mats securely with double-sided non-slip rug tape.  Do not have throw rugs and other things on the floor that can make you trip. What can I do in the bedroom?  Use night lights.  Make sure that you have a light by your bed that is easy to reach.  Do not use any sheets or blankets that are too big for your bed. They should not hang down onto the floor.  Have a firm chair that has side arms. You can use this for support while you get dressed.  Do not have throw rugs and other things on the floor that can make you trip. What can I do in the kitchen?  Clean up any spills right away.  Avoid walking on wet floors.  Keep items that you use a lot in easy-to-reach places.  If you need to reach something above you,  use a strong step stool that has a grab bar.  Keep electrical cords out of the way.  Do not use floor polish or wax that makes floors slippery. If you must use wax, use non-skid floor wax.  Do not have throw rugs and other things on the floor that can make you trip. What can I do with my stairs?  Do not leave any items on the stairs.  Make sure that there are handrails on both sides of the stairs and use them. Fix handrails that are broken or loose. Make sure that handrails are as long as the stairways.  Check any carpeting to make sure that it is firmly attached to the stairs. Fix any carpet that is loose or worn.  Avoid having throw rugs at the top or bottom of the stairs. If you do have throw rugs, attach them to the floor with carpet tape.  Make sure that you have a light switch at the top of the stairs and the bottom of the stairs. If you do not have them, ask someone to add them for you. What  else can I do to help prevent falls?  Wear shoes that:  Do not have high heels.  Have rubber bottoms.  Are comfortable and fit you well.  Are closed at the toe. Do not wear sandals.  If you use a stepladder:  Make sure that it is fully opened. Do not climb a closed stepladder.  Make sure that both sides of the stepladder are locked into place.  Ask someone to hold it for you, if possible.  Clearly mark and make sure that you can see:  Any grab bars or handrails.  First and last steps.  Where the edge of each step is.  Use tools that help you move around (mobility aids) if they are needed. These include:  Canes.  Walkers.  Scooters.  Crutches.  Turn on the lights when you go into a dark area. Replace any light bulbs as soon as they burn out.  Set up your furniture so you have a clear path. Avoid moving your furniture around.  If any of your floors are uneven, fix them.  If there are any pets around you, be aware of where they are.  Review your medicines with your doctor. Some medicines can make you feel dizzy. This can increase your chance of falling. Ask your doctor what other things that you can do to help prevent falls. This information is not intended to replace advice given to you by your health care provider. Make sure you discuss any questions you have with your health care provider. Document Released: 06/24/2009 Document Revised: 02/03/2016 Document Reviewed: 10/02/2014 Elsevier Interactive Patient Education  2017 [employer].

## 2021-02-02 ENCOUNTER — Other Ambulatory Visit: Payer: Self-pay | Admitting: Family Medicine

## 2021-02-08 NOTE — Telephone Encounter (Signed)
90-day with 1 refill (Will not be doing 1 year prescription because that is too long)

## 2021-02-17 DIAGNOSIS — K3189 Other diseases of stomach and duodenum: Secondary | ICD-10-CM | POA: Diagnosis not present

## 2021-02-17 DIAGNOSIS — R109 Unspecified abdominal pain: Secondary | ICD-10-CM | POA: Diagnosis not present

## 2021-02-17 DIAGNOSIS — K921 Melena: Secondary | ICD-10-CM | POA: Diagnosis not present

## 2021-03-10 DIAGNOSIS — R000 Tachycardia, unspecified: Secondary | ICD-10-CM | POA: Diagnosis not present

## 2021-03-10 DIAGNOSIS — E785 Hyperlipidemia, unspecified: Secondary | ICD-10-CM | POA: Diagnosis not present

## 2021-03-10 DIAGNOSIS — Z79899 Other long term (current) drug therapy: Secondary | ICD-10-CM | POA: Diagnosis not present

## 2021-03-11 ENCOUNTER — Encounter: Payer: Self-pay | Admitting: Family Medicine

## 2021-03-11 LAB — LIPID PANEL
Chol/HDL Ratio: 3.6 ratio (ref 0.0–4.4)
Cholesterol, Total: 155 mg/dL (ref 100–199)
HDL: 43 mg/dL (ref >39)
LDL Chol Calc (NIH): 96 mg/dL (ref 0–99)
Triglycerides: 86 mg/dL (ref 0–149)
VLDL Cholesterol Cal: 16 mg/dL (ref 5–40)

## 2021-03-11 LAB — HEPATIC FUNCTION PANEL
ALT: 12 IU/L (ref 0–32)
AST: 19 IU/L (ref 0–40)
Albumin: 4.1 g/dL (ref 3.7–4.7)
Alkaline Phosphatase: 97 IU/L (ref 44–121)
Bilirubin Total: 0.4 mg/dL (ref 0.0–1.2)
Bilirubin, Direct: 0.14 mg/dL (ref 0.00–0.40)
Total Protein: 6.8 g/dL (ref 6.0–8.5)

## 2021-03-11 LAB — T4, FREE: Free T4: 1.37 ng/dL (ref 0.82–1.77)

## 2021-03-11 LAB — TSH: TSH: 0.657 u[IU]/mL (ref 0.450–4.500)

## 2021-04-20 ENCOUNTER — Ambulatory Visit: Payer: Medicare Other | Admitting: Family Medicine

## 2021-05-02 ENCOUNTER — Ambulatory Visit (INDEPENDENT_AMBULATORY_CARE_PROVIDER_SITE_OTHER): Payer: Medicare Other | Admitting: Family Medicine

## 2021-05-02 ENCOUNTER — Other Ambulatory Visit: Payer: Self-pay

## 2021-05-02 VITALS — BP 138/78 | HR 61 | Temp 97.5°F | Ht 66.0 in | Wt 183.0 lb

## 2021-05-02 DIAGNOSIS — Z1159 Encounter for screening for other viral diseases: Secondary | ICD-10-CM

## 2021-05-02 DIAGNOSIS — M79671 Pain in right foot: Secondary | ICD-10-CM

## 2021-05-02 DIAGNOSIS — N1831 Chronic kidney disease, stage 3a: Secondary | ICD-10-CM

## 2021-05-02 DIAGNOSIS — E876 Hypokalemia: Secondary | ICD-10-CM | POA: Diagnosis not present

## 2021-05-02 DIAGNOSIS — I10 Essential (primary) hypertension: Secondary | ICD-10-CM

## 2021-05-02 MED ORDER — DICLOFENAC SODIUM 50 MG PO TBEC: DELAYED_RELEASE_TABLET | ORAL | 1 refills | Status: DC

## 2021-05-02 NOTE — Patient Instructions (Addendum)
We will help set you up with podiatry. Please follow-up if any ongoing troubles  We will see you in the fall please do your lab work before that visit  Please bring your medications or a list of your medicines to your next visit  Thanks-Dr. Nicki Reaper

## 2021-05-02 NOTE — Progress Notes (Signed)
   Subjective:    Patient ID: Melissa Barajas, female    DOB: [birth year], 80 y.o.   MRN: [id number]  HPI Right Foot pain and mild swelling  4 weeks , bottom of foot  Significant foot pain discomfort.  He denies any injury.  States this keeps happening she would like to see Dr. Caprice Beaver  Essential hypertension - Plan: Hepatitis C Antibody, Basic metabolic panel  Right foot pain - Plan: AMB referral to orthopedics  Chronic kidney disease, stage 3a ([place]) - Plan: Hepatitis C Antibody, Basic metabolic panel  Hypokalemia - Plan: Hepatitis C Antibody, Basic metabolic panel  Encounter for hepatitis C screening test for low risk patient - Plan: Hepatitis C Antibody, Basic metabolic panel   Review of Systems     Objective:   Physical Exam Lungs clear heart regular foot tenderness noted in the right foot       Assessment & Plan:  Foot pain-could be plantar fasciitis recommend consultation with specialist  1. Essential hypertension Blood pressure stable medications reviewed patient needs follow-up this fall for a formal visit on this - Hepatitis C Antibody - Basic metabolic panel  2. Right foot pain Referral to podiatry Dr. Caprice Beaver - AMB referral to orthopedics  3. Chronic kidney disease, stage 3a ([place]) Repeat metabolic 7 before next visit - Hepatitis C Antibody - Basic metabolic panel  4. Hypokalemia Repeat metabolic 7 before next visit - Hepatitis C Antibody - Basic metabolic panel Somehow her med list still included chlorthalidone even though that medication was stopped 5. Encounter for hepatitis C screening test for low risk patient Hep C per CDC guideline - Hepatitis C Antibody - Basic metabolic panel Labs before follow-up in October

## 2021-05-03 NOTE — Addendum Note (Signed)
Addended by: [name] on: 05/03/2021 09:24 AM   Modules accepted: Orders

## 2021-05-03 NOTE — Progress Notes (Signed)
Mail order pharmacy contacted and verbalized understanding

## 2021-05-03 NOTE — Progress Notes (Signed)
05/03/21 referral to podiatry ordered

## 2021-05-04 ENCOUNTER — Other Ambulatory Visit: Payer: Self-pay | Admitting: Family Medicine

## 2021-05-15 ENCOUNTER — Other Ambulatory Visit: Payer: Self-pay | Admitting: Family Medicine

## 2021-05-17 ENCOUNTER — Ambulatory Visit (INDEPENDENT_AMBULATORY_CARE_PROVIDER_SITE_OTHER): Payer: Medicare Other

## 2021-05-17 ENCOUNTER — Ambulatory Visit (INDEPENDENT_AMBULATORY_CARE_PROVIDER_SITE_OTHER): Payer: Medicare Other | Admitting: Podiatry

## 2021-05-17 ENCOUNTER — Ambulatory Visit: Payer: Medicare Other

## 2021-05-17 ENCOUNTER — Other Ambulatory Visit: Payer: Self-pay

## 2021-05-17 ENCOUNTER — Encounter: Payer: Self-pay | Admitting: Podiatry

## 2021-05-17 DIAGNOSIS — M722 Plantar fascial fibromatosis: Secondary | ICD-10-CM

## 2021-05-17 DIAGNOSIS — M778 Other enthesopathies, not elsewhere classified: Secondary | ICD-10-CM

## 2021-05-17 DIAGNOSIS — M2142 Flat foot [pes planus] (acquired), left foot: Secondary | ICD-10-CM | POA: Diagnosis not present

## 2021-05-17 DIAGNOSIS — M2141 Flat foot [pes planus] (acquired), right foot: Secondary | ICD-10-CM

## 2021-05-17 NOTE — Patient Instructions (Signed)

## 2021-05-17 NOTE — Progress Notes (Signed)
  Subjective:  Patient ID: Melissa Barajas, female    DOB: 07-16-1941,  MRN: [id number]  Chief Complaint  Patient presents with   Foot Pain     np-right foot pain x1 month, says it felt like walking on a ball-non req-dr. scott luking refer    80 y.o. female presents with the above complaint. History confirmed with patient.  Was painful for some time and then last weekend, went away on its own completely resolved.  She has had swelling in the ankles and flatfeet all her life.  Objective:  Physical Exam: warm, good capillary refill, no trophic changes or ulcerative lesions, normal DP and PT pulses, normal sensory exam, and she has pes planus and mild tenderness at the plantar insertion of plantar fascia on the heel on the right foot.  Assessment:   1. Plantar fasciitis of right foot      Plan:  Patient was evaluated and treated and all questions answered.  Discussed with her I suspect she had Planter fasciitis that is now resolving.  I recommended home exercise plan and gave this information to her on how to do her therapy exercises to limit the rest of this and if it returns.  Return to see me as needed for this if it worsens.  We discussed supportive shoe gear and supporting her flatter feet.  Also discussed swelling in the ankles and has currently to many things including blood pressure changes medications as well as venous insufficiency.  Return if symptoms worsen or fail to improve.

## 2021-06-03 DIAGNOSIS — Z23 Encounter for immunization: Secondary | ICD-10-CM | POA: Diagnosis not present

## 2021-06-07 ENCOUNTER — Encounter: Payer: Self-pay | Admitting: Obstetrics & Gynecology

## 2021-06-07 ENCOUNTER — Ambulatory Visit (INDEPENDENT_AMBULATORY_CARE_PROVIDER_SITE_OTHER): Payer: Medicare Other | Admitting: Obstetrics & Gynecology

## 2021-06-07 ENCOUNTER — Other Ambulatory Visit: Payer: Self-pay

## 2021-06-07 VITALS — BP 145/69 | HR 53 | Ht 66.0 in | Wt 176.6 lb

## 2021-06-07 DIAGNOSIS — Z1231 Encounter for screening mammogram for malignant neoplasm of breast: Secondary | ICD-10-CM | POA: Diagnosis not present

## 2021-06-07 DIAGNOSIS — N644 Mastodynia: Secondary | ICD-10-CM

## 2021-06-07 NOTE — Progress Notes (Signed)
Chief Complaint  Patient presents with   Breast Problem      80 y.o. [id number] No LMP recorded. Patient has had a hysterectomy. The current method of family planning is status post hysterectomy.  Outpatient Encounter Medications as of 06/07/2021  Medication Sig Note   albuterol (VENTOLIN HFA) 108 (90 Base) MCG/ACT inhaler Inhale 2 puffs into the lungs every 4 (four) hours as needed for wheezing or shortness of breath.    Ascorbic Acid (VITAMIN C) 1000 MG tablet Take 1,000 mg by mouth daily.    Cholecalciferol (VITAMIN D3) 50 MCG (2000 UT) TABS Take 2,000 Units by mouth daily.    cloNIDine (CATAPRES) 0.1 MG tablet TAKE 1 TABLET BY MOUTH  TWICE DAILY    diclofenac (VOLTAREN) 50 MG EC tablet One bid prn    diltiazem (CARDIZEM CD) 360 MG 24 hr capsule TAKE 1 CAPSULE BY MOUTH  DAILY    famotidine (PEPCID) 40 MG tablet TAKE 1 TABLET BY MOUTH  DAILY    fluticasone furoate-vilanterol (BREO ELLIPTA) 200-25 MCG/INH AEPB USE 1 INHALATION BY MOUTH  DAILY    Phenazopyridine HCl (AZO TABS PO) Take by mouth.    Polyethyl Glycol-Propyl Glycol (SYSTANE OP) Place 2 drops into both eyes 2 (two) times daily.  08/24/2020: Blink eye drops   potassium chloride (KLOR-CON) 10 MEQ tablet Take 1 tablet (10 mEq total) by mouth daily.    rosuvastatin (CRESTOR) 20 MG tablet TAKE 1 TABLET BY MOUTH  DAILY    Specialty Vitamins Products (BRAIN PO) Take 1 capsule by mouth daily. FOREBRAIN COGNITIVE PERFORMANCE SUPPLEMENT    No facility-administered encounter medications on file as of 06/07/2021.    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 screening colonoscopy in 10 years 2. She should follow a high fiber diet She has been given a hand out on high fiber diverticulosois and hemorrhoids   COLONOSCOPY N/A 02/07/2018   Procedure: COLONOSCOPY;  Surgeon: Danie Binder, MD;  [facility];  Service: Endoscopy;  Laterality: N/A;  1:00pm   CYSTECTOMY     Left breast   ESOPHAGOGASTRODUODENOSCOPY     UXN:[id number] appearing Schatzki's ring, possible cervical esophageal web, both dilated with passage of a Maloney dilator/small hiatal hernia   ESOPHAGOGASTRODUODENOSCOPY N/A 10/25/2015   Procedure: ESOPHAGOGASTRODUODENOSCOPY (EGD);  Surgeon: Danie Binder, MD;  [facility];  Service: Endoscopy;  Laterality: N/A;  1215pm   ESOPHAGOGASTRODUODENOSCOPY N/A 10/24/2019   Schatzki's ring s/p dilation and  single medium firm, probable submucosal nodule without bleeding. Medium sized hiatal hernia, normal duodenum.    ESOPHAGOGASTRODUODENOSCOPY N/A 04/05/2020   10 mm leiomyoma and capsule study without obvious lesion or bleeding.   EUS  11/2019   Dr. Paulita Fujita: EUS completed March 2021 with likely leiomyoma, no other concerns. 10 mm by 10 mm.    GIVENS CAPSULE STUDY N/A 04/06/2020   Procedure: GIVENS CAPSULE STUDY;  Surgeon: Rogene Houston, MD;  [facility];  Service: Endoscopy;  Laterality: N/A;   POLYPECTOMY  02/07/2018   Procedure: POLYPECTOMY;  Surgeon: Danie Binder, MD;  [facility];   Service: Endoscopy;;  ascending colon, descending, sigmoid   SAVORY DILATION N/A 10/25/2015   Procedure: SAVORY DILATION;  Surgeon: Danie Binder, MD;  [facility];  Service: Endoscopy;  Laterality: N/A;   SAVORY DILATION N/A 10/24/2019   Procedure: SAVORY DILATION;  Surgeon: Danie Binder, MD;  [facility];  Service: Endoscopy;  Laterality: N/A;   THYROIDECTOMY     Tumor   05/2010   Left breast under nipple   VESICOVAGINAL FISTULA CLOSURE W/ TAH     fibroid tumors    OB History     Gravida  6   Para      Term      Preterm      AB  2   Living         SAB      IAB      Ectopic      Multiple      Live Births              Allergies  Allergen Reactions   Aricept [Donepezil Hcl]     Sleep deprived   Hydrocodone Nausea Only   Lisinopril Other (See Comments)    Angio edema   Naprosyn [Naproxen] Nausea Only   Vioxx [Rofecoxib]     Unknown reaction     Social History   Socioeconomic History   Marital status: Widowed    Spouse name: Not on file   Number of children: Not on file   Years of education: Not on file   Highest education level: Not on file  Occupational History   Not on file  Tobacco Use   Smoking status: Former    Packs/day: 1.00    Years: 15.00    Pack years: 15.00    Types: Cigarettes    Start date: 09/12/1983    Quit date: 09/11/1993    Years since quitting: 27.7   Smokeless tobacco: Never  Vaping Use   Vaping Use: Never used  Substance and Sexual Activity   Alcohol use: No    Alcohol/week: 0.0 standard drinks   Drug use: No   Sexual activity: Not Currently    Birth control/protection: Post-menopausal, Surgical  Other Topics Concern   Not on file  Social History Narrative   Not on file   Social Determinants of Health   Financial Resource Strain: Low Risk    Difficulty of Paying Living Expenses: Not hard at all  Food Insecurity: No Food Insecurity   Worried About Charity fundraiser in the Last Year:  Never true   [place] in the Last Year: Never true  Transportation Needs: No Transportation Needs   Lack of Transportation (Medical): No   Lack of Transportation (Non-Medical): No  Physical Activity: Sufficiently Active   Days of Exercise per Week: 3 days   Minutes  of Exercise per Session: 60 min  Stress: No Stress Concern Present   Feeling of Stress : Not at all  Social Connections: Moderately Integrated   Frequency of Communication with Friends and Family: More than three times a week   Frequency of Social Gatherings with Friends and Family: Once a week   Attends Religious Services: More than 4 times per year   Active Member of [employer] or Organizations: Yes   Attends Archivist Meetings: More than 4 times per year   Marital Status: Widowed    Family History  Problem Relation Age of Onset   Cancer Brother        Prostate    Medications:       Current Outpatient Medications:    albuterol (VENTOLIN HFA) 108 (90 Base) MCG/ACT inhaler, Inhale 2 puffs into the lungs every 4 (four) hours as needed for wheezing or shortness of breath., Disp: 1 Inhaler, Rfl: 0   Ascorbic Acid (VITAMIN C) 1000 MG tablet, Take 1,000 mg by mouth daily., Disp: , Rfl:    Cholecalciferol (VITAMIN D3) 50 MCG (2000 UT) TABS, Take 2,000 Units by mouth daily., Disp: , Rfl:    cloNIDine (CATAPRES) 0.1 MG tablet, TAKE 1 TABLET BY MOUTH  TWICE DAILY, Disp: 180 tablet, Rfl: 1   diclofenac (VOLTAREN) 50 MG EC tablet, One bid prn, Disp: 60 tablet, Rfl: 1   diltiazem (CARDIZEM CD) 360 MG 24 hr capsule, TAKE 1 CAPSULE BY MOUTH  DAILY, Disp: 90 capsule, Rfl: 1   famotidine (PEPCID) 40 MG tablet, TAKE 1 TABLET BY MOUTH  DAILY, Disp: 90 tablet, Rfl: 1   fluticasone furoate-vilanterol (BREO ELLIPTA) 200-25 MCG/INH AEPB, USE 1 INHALATION BY MOUTH  DAILY, Disp: 180 each, Rfl: 3   Phenazopyridine HCl (AZO TABS PO), Take by mouth., Disp: , Rfl:    Polyethyl Glycol-Propyl Glycol (SYSTANE OP), Place 2 drops into both  eyes 2 (two) times daily. , Disp: , Rfl:    potassium chloride (KLOR-CON) 10 MEQ tablet, Take 1 tablet (10 mEq total) by mouth daily., Disp: 90 tablet, Rfl: 1   rosuvastatin (CRESTOR) 20 MG tablet, TAKE 1 TABLET BY MOUTH  DAILY, Disp: 90 tablet, Rfl: 1   Specialty Vitamins Products (BRAIN PO), Take 1 capsule by mouth daily. FOREBRAIN COGNITIVE PERFORMANCE SUPPLEMENT, Disp: , Rfl:   Objective Blood pressure (!) 145/69, pulse (!) 53, height 5\' 6"  (1.676 m), weight 176 lb 9.6 oz (80.1 kg).  Left breast with some tenderness at inferior margin of her previous cystectomy which is likely scar tissue, no masses palpable Right breast is normal No skin changes otherwise  Pertinent ROS No burning with urination, frequency or urgency No nausea, vomiting or diarrhea Nor fever chills or other constitutional symptoms'  Labs or studies Reviewed her previous studies    Impression Diagnoses this Encounter::   ICD-10-CM   1. Mastodynia of left breast, chronic for years, no change  N64.4    Exam reveals tenderness around previous cystectomy site, recommend screening exam which she is due for    2. Breast cancer screening by mammogram  Z12.31 MM 3D SCREEN BREAST BILATERAL      Established relevant diagnosis(es):   Plan/Recommendations: No orders of the defined types were placed in this encounter.   Labs or Scans Ordered: Orders Placed This Encounter  Procedures   MM 3D SCREEN BREAST BILATERAL    Management:: See above  Follow up Return if symptoms worsen or fail to improve.  All questions were answered.

## 2021-06-08 ENCOUNTER — Telehealth: Payer: Self-pay | Admitting: Obstetrics & Gynecology

## 2021-06-08 NOTE — Telephone Encounter (Signed)
[facility] told pt to tell Dr. Elonda Husky her order needs to be for a diagnostic mammogram Please change order & notify pt

## 2021-06-08 NOTE — Telephone Encounter (Signed)
Called [facility] mammography to clarify if pt truly needed order changed to a diagnostic. Stanton Kidney explained that since the pt was having chronic pain, it did not need to be changed. She would call the pt to clarify and schedule.

## 2021-06-09 DIAGNOSIS — H35033 Hypertensive retinopathy, bilateral: Secondary | ICD-10-CM | POA: Diagnosis not present

## 2021-06-09 DIAGNOSIS — H04123 Dry eye syndrome of bilateral lacrimal glands: Secondary | ICD-10-CM | POA: Diagnosis not present

## 2021-06-24 DIAGNOSIS — Z1159 Encounter for screening for other viral diseases: Secondary | ICD-10-CM | POA: Diagnosis not present

## 2021-06-24 DIAGNOSIS — N1831 Chronic kidney disease, stage 3a: Secondary | ICD-10-CM | POA: Diagnosis not present

## 2021-06-24 DIAGNOSIS — I10 Essential (primary) hypertension: Secondary | ICD-10-CM | POA: Diagnosis not present

## 2021-06-24 DIAGNOSIS — E876 Hypokalemia: Secondary | ICD-10-CM | POA: Diagnosis not present

## 2021-06-25 LAB — BASIC METABOLIC PANEL
BUN/Creatinine Ratio: 11 — ABNORMAL LOW (ref 12–28)
BUN: 10 mg/dL (ref 8–27)
CO2: 22 mmol/L (ref 20–29)
Calcium: 9.7 mg/dL (ref 8.7–10.3)
Chloride: 104 mmol/L (ref 96–106)
Creatinine, Ser: 0.91 mg/dL (ref 0.57–1.00)
Glucose: 92 mg/dL (ref 70–99)
Potassium: 4.1 mmol/L (ref 3.5–5.2)
Sodium: 141 mmol/L (ref 134–144)
eGFR: 65 mL/min/{1.73_m2} (ref >59)

## 2021-06-25 LAB — HEPATITIS C ANTIBODY: Hep C Virus Ab: <0.1 s/co ratio (ref 0.0–0.9)

## 2021-06-27 DIAGNOSIS — Z23 Encounter for immunization: Secondary | ICD-10-CM | POA: Diagnosis not present

## 2021-07-04 ENCOUNTER — Ambulatory Visit: Payer: Medicare Other | Admitting: Family Medicine

## 2021-07-05 ENCOUNTER — Encounter: Payer: Self-pay | Admitting: Family Medicine

## 2021-07-05 ENCOUNTER — Ambulatory Visit (INDEPENDENT_AMBULATORY_CARE_PROVIDER_SITE_OTHER): Payer: Medicare Other | Admitting: Family Medicine

## 2021-07-05 ENCOUNTER — Other Ambulatory Visit: Payer: Self-pay

## 2021-07-05 VITALS — BP 130/78 | Temp 97.2°F | Wt 176.2 lb

## 2021-07-05 DIAGNOSIS — M25571 Pain in right ankle and joints of right foot: Secondary | ICD-10-CM

## 2021-07-05 DIAGNOSIS — D214 Benign neoplasm of connective and other soft tissue of abdomen: Secondary | ICD-10-CM | POA: Diagnosis not present

## 2021-07-05 DIAGNOSIS — M25572 Pain in left ankle and joints of left foot: Secondary | ICD-10-CM | POA: Diagnosis not present

## 2021-07-05 DIAGNOSIS — R1013 Epigastric pain: Secondary | ICD-10-CM

## 2021-07-05 DIAGNOSIS — E7849 Other hyperlipidemia: Secondary | ICD-10-CM

## 2021-07-05 DIAGNOSIS — I10 Essential (primary) hypertension: Secondary | ICD-10-CM

## 2021-07-05 MED ORDER — ROSUVASTATIN CALCIUM 20 MG PO TABS: ORAL_TABLET | ORAL | 1 refills | Status: DC

## 2021-07-05 NOTE — Progress Notes (Signed)
   Subjective:    Patient ID: Melissa Barajas, female    DOB: 1941-03-17, 80 y.o.   MRN: [id number]  HPI Pt here for follow up on blood pressure. Pt states no issues. Taking meds as directed.  She finds that her feet ache and ankles ache whenever she tries to walk any substantial distance she saw podiatry tried some exercises states she has not gotten any better We did review over her labs recent cholesterol profile looks good  She has had some intermittent epigastric pain over the course of the past few weeks-few months.  Had EGD during the summertime which showed a leiomyoma but did not show any ulcers she is currently taking Pepcid Review of Systems     Objective:   Physical Exam General-in no acute distress Eyes-no discharge Lungs-respiratory rate normal, CTA CV-no murmurs,RRR Extremities skin warm dry no edema Neuro grossly normal Behavior normal, alert  When she does walk she has eversion which causes her to walk on the inside aspect of both feet      Assessment & Plan:  1. 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 Other hyperlipidemia Patient has reduced her cholesterol medicine to 2 days/week which I am fine with we will check labs again by February  4. Epigastric pain She will clean up her diet minimize tomato based products and she will let us know in 1 month if she is still having this discomfort if so we may need to change famotidine to a PPI  5. Bilateral ankle pain, unspecified chronicity She has eversion of both ankles where she ends up walking on the inside portion of both her feet.  Podiatry gave her some exercises which has had minimal improvement.  Ongoing discomfort more so in her ankles when she tries to walk and the deeper portion of her feet.  Not sure if she would benefit from inserts we will touch base with podiatry to see what their take on this  would be

## 2021-07-05 NOTE — Patient Instructions (Signed)

## 2021-07-07 NOTE — Progress Notes (Signed)
Pt contacted. Pt states podiatry called her today and set appt up

## 2021-07-13 ENCOUNTER — Telehealth: Payer: Self-pay | Admitting: Family Medicine

## 2021-07-13 NOTE — Telephone Encounter (Signed)
Pt states her left foot and ankle is swollen and sore. Unable to get relief last night. Did have some Tramadol left over from previous script and she was able to get some relief after taking Tramadol. Pt is seeing Dr.McDonald (podiatrist) in the morning. Please advise. Thank you.

## 2021-07-13 NOTE — Telephone Encounter (Signed)
Patient was seen 10/25. States her foot is swollen and asked for Tramadol to be called in to [facility] on [address]. Please advise.  B#  [phone]

## 2021-07-14 ENCOUNTER — Other Ambulatory Visit: Payer: Self-pay

## 2021-07-14 ENCOUNTER — Ambulatory Visit (INDEPENDENT_AMBULATORY_CARE_PROVIDER_SITE_OTHER): Payer: Medicare Other | Admitting: Podiatry

## 2021-07-14 DIAGNOSIS — M76821 Posterior tibial tendinitis, right leg: Secondary | ICD-10-CM | POA: Diagnosis not present

## 2021-07-14 DIAGNOSIS — M76822 Posterior tibial tendinitis, left leg: Secondary | ICD-10-CM

## 2021-07-14 DIAGNOSIS — M2141 Flat foot [pes planus] (acquired), right foot: Secondary | ICD-10-CM | POA: Diagnosis not present

## 2021-07-14 DIAGNOSIS — M2142 Flat foot [pes planus] (acquired), left foot: Secondary | ICD-10-CM

## 2021-07-14 MED ORDER — METHYLPREDNISOLONE 4 MG PO TBPK: ORAL_TABLET | ORAL | 0 refills | Status: DC

## 2021-07-14 NOTE — Patient Instructions (Signed)
Posterior Tibial Tendinitis  Posterior tibial tendinitis is irritation of a tendon called the posterior tibial tendon. Your posterior tibial tendon is a cord-like tissue that connects bones of your lower leg and foot to a muscle that: Supports your arch. Helps you raise up on your toes. Helps you turn your foot down and in. This condition causes foot and ankle pain. It can also lead to a flat foot. What are the causes? This condition is most often caused by repeated stress to the tendon (overuse injury). It can also be caused by a sudden injury that stresses the tendon, such as landing on your foot after jumping or falling. What increases the risk? This condition is more likely to develop in: People who play a sport that involves putting a lot of pressure on the feet, such as: Basketball. Tennis. Soccer. Hockey. Runners. Females who are older than 78 years of age and are overweight. People with diabetes. People with decreased foot stability. People with flat feet. What are the signs or symptoms? Symptoms include: Pain in the inner ankle. Pain at the arch of your foot. Pain that gets worse with running, walking, or standing. Swelling on the inside of your ankle and foot. Weakness in your ankle or foot. Inability to stand up on tiptoe. Flattening of the arch of your foot. How is this diagnosed? This condition may be diagnosed based on: Your symptoms. Your medical history. A physical exam. Tests, such as: X-ray. MRI. Ultrasound. How is this treated? This condition may be treated by: Putting ice to the injured area. Taking NSAIDs, such as ibuprofen, to reduce pain and swelling. Wearing a special shoe or shoe insert to support your arch (orthotic). Having physical therapy. Replacing high-impact exercise with low-impact exercise, such as swimming or cycling. If your symptoms do not improve with these treatments, you may need to wear a splint, removable walking boot, or short  leg cast for 6-8 weeks to keep your foot and ankle still (immobilized). Follow these instructions at home: If you have a cast, splint, or boot: Keep it clean and dry. Check the skin around it every day. Tell your health care provider about any concerns. If you have a cast: Do not stick anything inside it to scratch your skin. Doing that increases your risk of infection. You may put lotion on dry skin around the edges of the cast. Do not put lotion on the skin underneath the cast. If you have a splint or boot: Wear it as told by your health care provider. Remove it only as told by your health care provider. Loosen it if your toes tingle, become numb, or turn cold and blue. Bathing Do not take baths, swim, or use a hot tub until your health care provider approves. Ask your health care provider if you may take showers. If your cast, splint, or boot is not waterproof: Do not let it get wet. Cover it with a waterproof covering while you take a bath or a shower. Managing pain and swelling   If directed, put ice on the injured area. If you have a removable splint or boot, remove it as told by your health care provider. Put ice in a plastic bag. Place a towel between your skin and the bag or between your cast and the bag. Leave the ice on for 20 minutes, 2-3 times a day. Move your toes often to reduce stiffness and swelling. Raise (elevate) the injured area above the level of your heart while you are sitting  or lying down. Activity Do not use the injured foot to support your body weight until your health care provider says that you can. Use crutches as told by your health care provider. Do not do activities that make pain or swelling worse. Ask your health care provider when it is safe to drive if you have a cast, splint, or boot on your foot. Return to your normal activities as told by your health care provider. Ask your health care provider what activities are safe for you. Do exercises as  told by your health care provider. General instructions Take over-the-counter and prescription medicines only as told by your health care provider. If you have an orthotic, use it as told by your health care provider. Keep all follow-up visits as told by your health care provider. This is important. How is this prevented? Wear footwear that is appropriate to your athletic activity. Avoid athletic activities that cause pain or swelling in your ankle or foot. Before being active, do range-of-motion and stretching exercises. If you develop pain or swelling while training, stop training. If you have pain or swelling that does not improve after a few days of rest, see your health care provider. If you start a new athletic activity, start gradually so you can build up your strength and flexibility. Contact a health care provider if: Your symptoms get worse. Your symptoms do not improve in 6-8 weeks. You develop new, unexplained symptoms. Your splint, boot, or cast gets damaged. Summary Posterior tibial tendinitis is irritation of a tendon called the posterior tibial tendon. This condition is most often caused by repeated stress to the tendon (overuse injury). This condition causes foot pain and ankle pain. It can also lead to a flat foot. This condition may be treated by not doing high-impact activities, applying ice, having physical therapy, wearing orthotics, and wearing a cast, splint, or boot if needed. This information is not intended to replace advice given to you by your health care provider. Make sure you discuss any questions you have with your health care provider. Document Revised: 12/24/2018 Document Reviewed: 10/31/2018 Elsevier Patient Education  [place].  Posterior Tibial Tendinitis Rehab Ask your health care provider which exercises are safe for you. Do exercises exactly as told by your health care provider and adjust them as directed. It is normal to feel mild  stretching, pulling, tightness, or discomfort as you do these exercises. Stop right away if you feel sudden pain or your pain gets worse. Do not begin these exercises until told by your health care provider. Stretching and range-of-motion exercises These exercises warm up your muscles and joints and improve the movement and flexibility in your ankle and foot. These exercises may also help to relieve pain. Standing wall calf stretch, knee straight   Stand with your hands against a wall. Extend your left / right leg behind you, and bend your front knee slightly. If directed, place a folded washcloth under the arch of your foot for support. Point the toes of your back foot slightly inward. Keeping your heels on the floor and your back knee straight, shift your weight toward the wall. Do not allow your back to arch. You should feel a gentle stretch in your upper left / right calf. Hold this position for 10 seconds. Repeat 10 times. Complete this exercise 2 times a day. Standing wall calf stretch, knee bent Stand with your hands against a wall. Extend your left / right leg behind you, and bend your front  knee slightly. If directed, place a folded washcloth under the arch of your foot for support. Point the toes of your back foot slightly inward. Unlock your back knee so it is bent. Keep your heels on the floor. You should feel a gentle stretch deep in your lower left / right calf. Hold this position for 10 seconds. Repeat 10 times. Complete this exercise 2 times a day. Strengthening exercises These exercises build strength and endurance in your ankle and foot. Endurance is the ability to use your muscles for a long time, even after they get tired. Ankle inversion with band Secure one end of a rubber exercise band or tubing to a fixed object, such as a table leg or a pole, that will stay still when the band is pulled. Loop the other end of the band around the middle of your left / right foot. Sit  on the floor facing the object with your left / right leg extended. The band or tube should be slightly tense when your foot is relaxed. Leading with your big toe, slowly bring your left / right foot and ankle inward, toward your other foot (inversion). Hold this position for 10 seconds. Slowly return your foot to the starting position. Repeat 10 times. Complete this exercise 2 times a day. Towel curls   Sit in a chair on a non-carpeted surface, and put your feet on the floor. Place a towel in front of your feet. Keeping your heel on the floor, put your left / right foot on the towel. Pull the towel toward you by grabbing the towel with your toes and curling them under. Keep your heel on the floor while you do this. Let your toes relax. Grab the towel with your toes again. Keep going until the towel is completely underneath your foot. Repeat 10 times. Complete this exercise 2 times a day. Balance exercise This exercise improves or maintains your balance. Balance is important in preventing falls. Single leg stand Without wearing shoes, stand near a railing or in a doorway. You may hold on to the railing or door frame as needed for balance. Stand on your left / right foot. Keep your big toe down on the floor and try to keep your arch lifted. If balancing in this position is too easy, try the exercise with your eyes closed or while standing on a pillow. Hold this position for 10 seconds. Repeat 10 times. Complete this exercise 2 times a day. This information is not intended to replace advice given to you by your health care provider. Make sure you discuss any questions you have with your health care provider.

## 2021-07-15 ENCOUNTER — Other Ambulatory Visit: Payer: Self-pay | Admitting: Family Medicine

## 2021-07-17 ENCOUNTER — Other Ambulatory Visit: Payer: Self-pay | Admitting: Family Medicine

## 2021-07-17 MED ORDER — TRAMADOL HCL 50 MG PO TABS: 50.0000 mg | ORAL_TABLET | Freq: Three times a day (TID) | ORAL | 0 refills | Status: AC | PRN

## 2021-07-17 NOTE — Telephone Encounter (Signed)
Prescription was sent in as requested

## 2021-07-18 NOTE — Progress Notes (Signed)
  Subjective:  Patient ID: Melissa Barajas, female    DOB: [birth year],  MRN: [id number]  Chief Complaint  Patient presents with   Plantar Fasciitis      bil foot pain/ankle-requesting injections/orthotics    80 y.o. female presents with the above complaint. History confirmed with patient.  Was doing somewhat better after last visit, the pain now is severe on the left on the inside of the ankle she can barely put weight on it  Objective:  Physical Exam: warm, good capillary refill, no trophic changes or ulcerative lesions, normal DP and PT pulses, normal sensory exam, and today she has severe pain on the PT tendon on the left that is quite severe and to palpation, similar but less so on the right side in severity  Assessment:   1. Posterior tibial tendon dysfunction (PTTD) of both lower extremities   2. Pes planus of both feet      Plan:  Patient was evaluated and treated and all questions answered.  Discussed the etiology and treatment options for posterior tibial tendinitis including stretching, formal physical therapy with an eccentric exercises therapy plan, supportive shoegears such as a running shoe or sneaker, bracing, topical and oral medications.  We also discussed that I do not routinely perform injections in this area because of the risk of an increased damage or rupture of the tendon.  We also discussed the role of surgical treatment of this for patients who do not improve after exhausting non-surgical treatment options.  -Educated on stretching and icing of the affected limb. -Referral placed to physical therapy. -Rx for Medrol 6-day taper. Advised on risks, benefits, and alternatives of the medication -Plan to MRI if no better by next visit -Tri-Lock ankle brace dispensed bilateral   Return in about 1 month (around 08/13/2021) for re-check PT tendonitis .

## 2021-07-18 NOTE — Telephone Encounter (Signed)
Pt contacted and verbalized understanding.  

## 2021-08-08 ENCOUNTER — Encounter (HOSPITAL_COMMUNITY): Payer: Self-pay | Admitting: Physical Therapy

## 2021-08-08 ENCOUNTER — Ambulatory Visit (HOSPITAL_COMMUNITY): Payer: Medicare Other | Attending: Podiatry | Admitting: Physical Therapy

## 2021-08-08 ENCOUNTER — Other Ambulatory Visit: Payer: Self-pay

## 2021-08-08 DIAGNOSIS — M25572 Pain in left ankle and joints of left foot: Secondary | ICD-10-CM

## 2021-08-08 DIAGNOSIS — M2142 Flat foot [pes planus] (acquired), left foot: Secondary | ICD-10-CM | POA: Diagnosis not present

## 2021-08-08 DIAGNOSIS — M2141 Flat foot [pes planus] (acquired), right foot: Secondary | ICD-10-CM | POA: Insufficient documentation

## 2021-08-08 DIAGNOSIS — R262 Difficulty in walking, not elsewhere classified: Secondary | ICD-10-CM | POA: Insufficient documentation

## 2021-08-08 DIAGNOSIS — M76821 Posterior tibial tendinitis, right leg: Secondary | ICD-10-CM | POA: Insufficient documentation

## 2021-08-08 DIAGNOSIS — M76822 Posterior tibial tendinitis, left leg: Secondary | ICD-10-CM | POA: Insufficient documentation

## 2021-08-08 DIAGNOSIS — M25571 Pain in right ankle and joints of right foot: Secondary | ICD-10-CM

## 2021-08-08 NOTE — Therapy (Signed)
[place], [place], [address] Phone: [phone]   Fax:  [phone]  Physical Therapy Evaluation  Patient Details  Name: Melissa Barajas MRN: [id number] Date of Birth: 1941-06-20 Referring Provider (PT): Criselda Peaches, DPM   Encounter Date: 08/08/2021   PT End of Session - 08/08/21 1040     Visit Number 1    Number of Visits 12    Date for PT Re-Evaluation 09/19/21    Authorization Type medicare  and secondary [employer]    PT Start Time 1045    PT Stop Time 1125    PT Time Calculation (min) 40 min    Activity Tolerance Patient limited by pain    Behavior During Therapy [facility] for tasks assessed/performed             Past Medical History:  Diagnosis Date   Arthritis    Atrial fibrillation ([place])    Collagen vascular disease ([place])    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 screening colonoscopy in 10 years 2. She should follow a high fiber diet She has been given a hand out on high fiber diverticulosois and hemorrhoids   COLONOSCOPY N/A 02/07/2018   Procedure: COLONOSCOPY;  Surgeon: Danie Binder, MD;  [facility];  Service: Endoscopy;  Laterality: N/A;  1:00pm   CYSTECTOMY     Left breast   ESOPHAGOGASTRODUODENOSCOPY     [id number] appearing Schatzki's ring, possible cervical esophageal web, both dilated with passage of a  Maloney dilator/small hiatal hernia   ESOPHAGOGASTRODUODENOSCOPY N/A 10/25/2015   Procedure: ESOPHAGOGASTRODUODENOSCOPY (EGD);  Surgeon: Danie Binder, MD;  [facility];  Service: Endoscopy;  Laterality: N/A;  1215pm   ESOPHAGOGASTRODUODENOSCOPY N/A 10/24/2019   Schatzki's ring s/p dilation and single medium firm, probable submucosal nodule without bleeding. Medium sized hiatal hernia, normal duodenum.    ESOPHAGOGASTRODUODENOSCOPY N/A 04/05/2020   10 mm leiomyoma and capsule study without obvious lesion or bleeding.   EUS  11/2019   Dr. Paulita Fujita: EUS completed March 2021 with likely leiomyoma, no other concerns. 10 mm by 10 mm.    GIVENS CAPSULE STUDY N/A 04/06/2020   Procedure: GIVENS CAPSULE STUDY;  Surgeon: Rogene [place], MD;  [facility];  Service: Endoscopy;  Laterality: N/A;   POLYPECTOMY  02/07/2018   Procedure: POLYPECTOMY;  Surgeon: Danie Binder, MD;  [facility];  Service: Endoscopy;;  ascending colon, descending, sigmoid   SAVORY DILATION N/A 10/25/2015   Procedure: SAVORY DILATION;  Surgeon: Danie Binder, MD;  [facility];  Service: Endoscopy;  Laterality: N/A;   SAVORY DILATION N/A 10/24/2019   Procedure: SAVORY DILATION;  Surgeon: Danie Binder, MD;  [facility];  Service: Endoscopy;  Laterality: N/A;   THYROIDECTOMY  Tumor   05/2010   Left breast under nipple   VESICOVAGINAL FISTULA CLOSURE W/ TAH     fibroid tumors    There were no vitals filed for this visit.    Subjective Assessment - 08/08/21 1051     Subjective States that she has been having foot pain on both feet. States that she has been wearing ankle braces. States she has swelling in both ankles. States that she has been having a good day today. States that her feet seem to turn in. States she used to walk a lot. States she has not been able to walk for the last 135 years. States her feet swell and hurt with walking and with rest. States she has  tried many things. Reports her pain has gone on for 40 years. Current pain level is 9/10 in her left ankle with left worse than right ankle.  States that her pain is primarily along the inside of her foot but yesterday it was going up her heel. States she has never tried PT. Reports she doesn't know if the braces are helping.    How long can you walk comfortably? 2-3 minutes    Patient Stated Goals to have less pain    Currently in Pain? Yes    Pain Score 9     Pain Location Ankle    Pain Orientation Left    Pain Descriptors / Indicators Aching;Throbbing    Pain Type Chronic pain                [facility] PT Assessment - 08/08/21 0001       Assessment   Medical Diagnosis post tib dysfunction    Referring Provider (PT) Criselda Peaches, DPM    Prior Therapy yes      Balance Screen   Has the patient fallen in the past 6 months No      Prior Function   Level of Independence Independent    Leisure walking      Cognition   Overall Cognitive Status Within Functional Limits for tasks assessed      Observation/Other Assessments   Observations pes planus bilaterally, everts feet bilaterally to DF    Focus on Therapeutic Outcomes (FOTO)  41% function      Observation/Other Assessments-Edema    Edema Figure 8      Figure 8 Edema   Figure 8 - Right  51 cm    Figure 8 - Left  52 cm      ROM / Strength   AROM / PROM / Strength AROM;Strength      AROM   AROM Assessment Site Ankle;Knee;Hip    Right/Left Hip Right;Left    Right/Left Ankle Left;Right    Right Ankle Dorsiflexion 2    Right Ankle Plantar Flexion 45    Left Ankle Dorsiflexion 4   lacking   Left Ankle Plantar Flexion 45      Strength   Strength Assessment Site Hip;Knee;Ankle    Right/Left Ankle Right;Left    Right Ankle Dorsiflexion 4+/5    Right Ankle Plantar Flexion 2/5    Right Ankle Inversion 3+/5    Left Ankle Dorsiflexion 3+/5   painful   Left Ankle Plantar Flexion 2/5    Left Ankle Inversion 3/5    Left  Ankle Eversion 4-/5      Palpation   Palpation comment no windlass with max exthension noted bilaterally, left arch foot intrinsic sensitive to touch and along Bilateral posterior tib.  Ambulation/Gait   Ambulation/Gait Yes    Ambulation Distance (Feet) 286 Feet    Gait Pattern Decreased stride length;Right foot flat;Left foot flat   everts feet, limited toe off/ limited ext of hips   Gait velocity reduced    Gait Comments 2MW                        Objective measurements completed on examination: See above findings.       [place] Adult PT Treatment/Exercise - 08/08/21 0001       Exercises   Exercises Ankle      Ankle Exercises: Seated   Other Seated Ankle Exercises ankle INV/EV B x15 5" holds                     PT Education - 08/08/21 1129     Education Details on current presentation, on HEP, on compression socks and goals    Person(s) Educated Patient    Methods Explanation    Comprehension Verbalized understanding              PT Short Term Goals - 08/08/21 1052       PT SHORT TERM GOAL #1   Title Patient will be independent in self management strategies to improve quality of life and functional outcomes.    Time 3    Period Weeks    Status New    Target Date 08/29/21      PT SHORT TERM GOAL #2   Title Patient will report at least 25% improvement in overall symptoms and/or function to demonstrate improved functional mobility    Time 3    Period Weeks    Status New    Target Date 08/29/21      PT SHORT TERM GOAL #3   Title Patient will be able to ambulate at least 5 minutes without increase in foot/ankle pain    Time 3    Period Weeks    Status New    Target Date 08/29/21               PT Long Term Goals - 08/08/21 1053       PT LONG TERM GOAL #1   Title Patient will report at least 50% improvement in overall symptoms and/or function to demonstrate improved functional mobility    Time 6    Period Weeks     Status New    Target Date 09/19/21      PT LONG TERM GOAL #2   Title Patient will meet predicted FOTO score to demonstrate improved overall function.    Time 6    Period Weeks    Status New    Target Date 09/19/21      PT LONG TERM GOAL #3   Title Patietn will be able to demonstrate 10 heel raises (double leg) without compensatory anterior weight shift to demonstrate improved LE strength.    Baseline unable at this time    Time 6    Period Weeks    Status New    Target Date 09/19/21                    Plan - 08/08/21 1057     Clinical Impression Statement Patient is a 80 y.o. female who presents to physical therapy with complaint of bilateral foot/ankle pain that has been there for the last 40 years. Patient demonstrates decreased strength, ROM restriction, balance deficits and gait abnormalities  which are likely contributing to symptoms of pain and are negatively impacting patient ability to perform ADLs and functional mobility tasks. Patient will benefit from skilled physical therapy services to address these deficits to reduce pain, improve level of function with ADLs, functional mobility tasks, and reduce risk for falls.    Personal Factors and Comorbidities Comorbidity 1;Comorbidity 2    Comorbidities B TKA    Examination-Activity Limitations Squat;Stairs;Stand;Transfers;Locomotion Level    Examination-Participation Restrictions Laundry;Meal Prep;Community Activity;Shop;Cleaning    Stability/Clinical Decision Making Stable/Uncomplicated    Clinical Decision Making Low    Rehab Potential Fair    PT Frequency 2x / week    PT Duration 6 weeks    PT Treatment/Interventions ADLs/Self Care Home Management;Iontophoresis 4mg /ml Dexamethasone;Moist Heat;Traction;Therapeutic exercise;Manual techniques;Therapeutic activities;Cryotherapy;DME Instruction;Gait training;Stair training;Dry needling;Joint Manipulations;Neuromuscular re-education;Patient/family education    PT Next  Visit Plan ASSESS hip strength, foot/ankle mobility (inversion and PF), and LE stengthenign and intrinsic mobility,    PT Home Exercise Plan compression socks, inversion    Consulted and Agree with Plan of Care Patient             Patient will benefit from skilled therapeutic intervention in order to improve the following deficits and impairments:  Abnormal gait, Decreased endurance, Increased edema, Decreased knowledge of use of DME, Decreased activity tolerance, Decreased balance, Decreased mobility, Decreased range of motion, Difficulty walking, Decreased strength, Pain  Visit Diagnosis: Difficulty in walking, not elsewhere classified  Pain in right ankle and joints of right foot  Pain in left ankle and joints of left foot     Problem List Patient Active Problem List   Diagnosis Date Noted   Melena    Upper GI bleeding 04/05/2020   Heme positive stool 04/05/2020   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/03/2020   BMI 30.0-30.9,adult 10/16/2019   Rectal bleeding    Hypercalcemia 02/01/2018   BRBPR (bright red blood per rectum) 11/29/2017   Cognitive dysfunction 10/30/2017   Dysphagia, pharyngeal 05/24/2017   Mass of left upper extremity 05/14/2015   Hyperglycemia 10/12/2014   Ganglion cyst 09/26/2013   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 [address] [place], [place], [address] Phone: [phone]   Fax:  [phone]  Name: Melissa Barajas MRN: [id number] Date of Birth: 1941/05/15

## 2021-08-11 ENCOUNTER — Ambulatory Visit (HOSPITAL_COMMUNITY): Payer: Medicare Other | Attending: Podiatry | Admitting: Physical Therapy

## 2021-08-11 ENCOUNTER — Other Ambulatory Visit: Payer: Self-pay

## 2021-08-11 DIAGNOSIS — M25572 Pain in left ankle and joints of left foot: Secondary | ICD-10-CM | POA: Diagnosis not present

## 2021-08-11 DIAGNOSIS — R262 Difficulty in walking, not elsewhere classified: Secondary | ICD-10-CM | POA: Insufficient documentation

## 2021-08-11 DIAGNOSIS — M25571 Pain in right ankle and joints of right foot: Secondary | ICD-10-CM | POA: Insufficient documentation

## 2021-08-11 NOTE — Therapy (Signed)
[place], [place], [address] Phone: [phone]   Fax:  [phone]  Physical Therapy Treatment  Patient Details  Name: LYSHA SCHRADE MRN: [id number] Date of Birth: 06/09/1941 Referring Provider (PT): Criselda Peaches, DPM   Encounter Date: 08/11/2021   PT End of Session - 08/11/21 1230     Visit Number 2    Number of Visits 12    Date for PT Re-Evaluation 09/19/21    Authorization Type medicare  and secondary [employer]    PT Start Time 1125    PT Stop Time 1210    PT Time Calculation (min) 45 min    Activity Tolerance Patient limited by pain    Behavior During Therapy [facility] for tasks assessed/performed             Past Medical History:  Diagnosis Date   Arthritis    Atrial fibrillation ([place])    Collagen vascular disease ([place])    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 screening colonoscopy in 10 years 2. She should follow a high fiber diet She has been given a hand out on high fiber diverticulosois and hemorrhoids   COLONOSCOPY N/A 02/07/2018   Procedure: COLONOSCOPY;  Surgeon: Danie Binder, MD;  [facility];  Service: Endoscopy;  Laterality: N/A;  1:00pm   CYSTECTOMY     Left breast   ESOPHAGOGASTRODUODENOSCOPY     [id number] appearing Schatzki's ring, possible cervical esophageal web, both dilated with passage of a  Maloney dilator/small hiatal hernia   ESOPHAGOGASTRODUODENOSCOPY N/A 10/25/2015   Procedure: ESOPHAGOGASTRODUODENOSCOPY (EGD);  Surgeon: Danie Binder, MD;  [facility];  Service: Endoscopy;  Laterality: N/A;  1215pm   ESOPHAGOGASTRODUODENOSCOPY N/A 10/24/2019   Schatzki's ring s/p dilation and single medium firm, probable submucosal nodule without bleeding. Medium sized hiatal hernia, normal duodenum.    ESOPHAGOGASTRODUODENOSCOPY N/A 04/05/2020   10 mm leiomyoma and capsule study without obvious lesion or bleeding.   EUS  11/2019   Dr. Paulita Fujita: EUS completed March 2021 with likely leiomyoma, no other concerns. 10 mm by 10 mm.    GIVENS CAPSULE STUDY N/A 04/06/2020   Procedure: GIVENS CAPSULE STUDY;  Surgeon: Rogene [place], MD;  [facility];  Service: Endoscopy;  Laterality: N/A;   POLYPECTOMY  02/07/2018   Procedure: POLYPECTOMY;  Surgeon: Danie Binder, MD;  [facility];  Service: Endoscopy;;  ascending colon, descending, sigmoid   SAVORY DILATION N/A 10/25/2015   Procedure: SAVORY DILATION;  Surgeon: Danie Binder, MD;  [facility];  Service: Endoscopy;  Laterality: N/A;   SAVORY DILATION N/A 10/24/2019   Procedure: SAVORY DILATION;  Surgeon: Danie Binder, MD;  [facility];  Service: Endoscopy;  Laterality: N/A;   THYROIDECTOMY  Tumor   05/2010   Left breast under nipple   VESICOVAGINAL FISTULA CLOSURE W/ TAH     fibroid tumors    There were no vitals filed for this visit.   Subjective Assessment - 08/11/21 1125     Subjective pt states her LT ankle hurts and is sore all the time whether she's on it or not.  Currently 7/10.    Currently in Pain? Yes    Pain Score 7     Pain Location Ankle    Pain Orientation Left    Pain Descriptors / Indicators Aching;Sore                [facility] PT Assessment - 08/11/21 0001       Assessment   Medical Diagnosis post tib dysfunction    Referring Provider (PT) Criselda Peaches, DPM    Prior Therapy yes      Strength   Right Hip Flexion 3/5    Right Hip Extension 3-/5    Right Hip ABduction 3-/5    Left Hip Flexion 3-/5    Left Hip Extension 3-/5    Left Hip ABduction 3-/5    Right Knee Flexion 3+/5    Right Knee Extension 5/5    Left Knee Flexion 3+/5    Left Knee Extension 4/5                           [facility] Adult PT Treatment/Exercise - 08/11/21 0001       Ankle Exercises: Standing   Heel Raises Both;10 reps    Toe Raise 10 reps    Other Standing Ankle Exercises hip abduction and extension 10X each      Ankle Exercises: Seated   Heel Raises Right;Left;15 reps    Other Seated Ankle Exercises ankle INV/EV B x15 5" holds "windshied wipers" with towel    Other Seated Ankle Exercises sit to stands no UE 10X      Ankle Exercises: Sidelying   Other Sidelying Ankle Exercises clams 10X5' holds      Ankle Exercises: Supine   Other Supine Ankle Exercises straight leg raises 10X, bridging 10X                     PT Education - 08/11/21 1230     Education Details reviewed goals and POC moving forward.  Reviewed HEP    Person(s) Educated Patient    Methods Explanation;Demonstration;Tactile cues;Verbal cues    Comprehension Verbalized understanding;Returned demonstration;Verbal cues required;Tactile cues required              PT Short Term Goals - 08/11/21 1200       PT SHORT TERM GOAL #1   Title Patient will be independent in self management strategies to improve quality of life and functional outcomes.    Time 3    Period Weeks    Status On-going    Target Date 08/29/21      PT SHORT TERM GOAL #2   Title Patient will report at least 25% improvement in overall symptoms and/or function to demonstrate improved functional mobility    Time 3    Period Weeks    Status On-going    Target Date 08/29/21      PT SHORT TERM GOAL #3   Title Patient will be able to ambulate at least 5 minutes without increase  in foot/ankle pain    Time 3  Period Weeks    Status On-going    Target Date 08/29/21               PT Long Term Goals - 08/11/21 1200       PT LONG TERM GOAL #1   Title Patient will report at least 50% improvement in overall symptoms and/or function to demonstrate improved functional mobility    Time 6    Period Weeks    Status On-going      PT LONG TERM GOAL #2   Title Patient will meet predicted FOTO score to demonstrate improved overall function.    Time 6    Period Weeks    Status On-going      PT LONG TERM GOAL #3   Title Patietn will be able to demonstrate 10 heel raises (double leg) without compensatory anterior weight shift to demonstrate improved LE strength.    Baseline unable at this time    Time 6    Period Weeks    Status On-going                   Plan - 08/11/21 1239     Clinical Impression Statement Goals reviewed and POC moving forward. PT with questions regarding her HEP and wanted to ensure she was doing correctly.  Inversion very challenging for patient without compensation.  Hip and LE mm MMT today with weakness noted throughout.  Initiated exercises to isolate these mm groups. Pt without any complaints of pain during or at end of session today.    Personal Factors and Comorbidities Comorbidity 1;Comorbidity 2    Comorbidities B TKA    Examination-Activity Limitations Squat;Stairs;Stand;Transfers;Locomotion Level    Examination-Participation Restrictions Laundry;Meal Prep;Community Activity;Shop;Cleaning    Stability/Clinical Decision Making Stable/Uncomplicated    Rehab Potential Fair    PT Frequency 2x / week    PT Duration 6 weeks    PT Treatment/Interventions ADLs/Self Care Home Management;Iontophoresis 4mg /ml Dexamethasone;Moist Heat;Traction;Therapeutic exercise;Manual techniques;Therapeutic activities;Cryotherapy;DME Instruction;Gait training;Stair training;Dry needling;Joint Manipulations;Neuromuscular  re-education;Patient/family education    PT Next Visit Plan continue with foot, ankle hip and general LE stengthening.    PT Home Exercise Plan compression socks, inversion    Consulted and Agree with Plan of Care Patient             Patient will benefit from skilled therapeutic intervention in order to improve the following deficits and impairments:  Abnormal gait, Decreased endurance, Increased edema, Decreased knowledge of use of DME, Decreased activity tolerance, Decreased balance, Decreased mobility, Decreased range of motion, Difficulty walking, Decreased strength, Pain  Visit Diagnosis: Difficulty in walking, not elsewhere classified  Pain in left ankle and joints of left foot  Pain in right ankle and joints of right foot     Problem List Patient Active Problem List   Diagnosis Date Noted   Melena    Upper GI bleeding 04/05/2020   Heme positive stool 04/05/2020   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/03/2020   BMI 30.0-30.9,adult 10/16/2019   Rectal bleeding    Hypercalcemia 02/01/2018   BRBPR (bright red blood per rectum) 11/29/2017   Cognitive dysfunction 10/30/2017   Dysphagia, pharyngeal 05/24/2017   Mass of left upper extremity 05/14/2015   Hyperglycemia 10/12/2014   Ganglion cyst 09/26/2013   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 [place], [place], [address] Phone: [phone]   Fax:  [phone]  Name: AUDREA BOLTE MRN: [id number] Date of Birth: 08-07-1941

## 2021-08-15 ENCOUNTER — Ambulatory Visit: Payer: Medicare Other | Admitting: Podiatry

## 2021-08-16 ENCOUNTER — Ambulatory Visit (HOSPITAL_COMMUNITY): Payer: Medicare Other | Admitting: Physical Therapy

## 2021-08-18 ENCOUNTER — Encounter (HOSPITAL_COMMUNITY): Payer: Medicare Other | Admitting: Physical Therapy

## 2021-08-18 ENCOUNTER — Ambulatory Visit: Payer: Medicare Other | Admitting: Podiatry

## 2021-08-18 ENCOUNTER — Telehealth (HOSPITAL_COMMUNITY): Payer: Self-pay | Admitting: Physical Therapy

## 2021-08-18 NOTE — Telephone Encounter (Signed)
Called about no show appt today. Voicemail full, unable to leave message.  5:17 PM, 08/18/21 Josue Hector PT DPT  Physical Therapist with [facility]  [phone]

## 2021-08-22 ENCOUNTER — Telehealth (HOSPITAL_COMMUNITY): Payer: Self-pay

## 2021-08-22 NOTE — Telephone Encounter (Signed)
She has tested positive for covid and will not be in the office until 08/30/2021

## 2021-08-23 ENCOUNTER — Ambulatory Visit (HOSPITAL_COMMUNITY): Payer: Medicare Other

## 2021-08-26 ENCOUNTER — Ambulatory Visit (HOSPITAL_COMMUNITY): Payer: Medicare Other

## 2021-08-30 ENCOUNTER — Other Ambulatory Visit: Payer: Self-pay

## 2021-08-30 ENCOUNTER — Ambulatory Visit (HOSPITAL_COMMUNITY): Payer: Medicare Other

## 2021-08-30 DIAGNOSIS — M25571 Pain in right ankle and joints of right foot: Secondary | ICD-10-CM | POA: Diagnosis not present

## 2021-08-30 DIAGNOSIS — M25572 Pain in left ankle and joints of left foot: Secondary | ICD-10-CM | POA: Diagnosis not present

## 2021-08-30 DIAGNOSIS — R262 Difficulty in walking, not elsewhere classified: Secondary | ICD-10-CM

## 2021-08-30 NOTE — Therapy (Signed)
Melissa Barajas, [place], [address] Phone: [phone]   Fax:  [phone]  Physical Therapy Treatment  Patient Details  Name: Melissa Barajas MRN: [id number] Date of Birth: February 11, 1941 Referring Provider (PT): Criselda Peaches, DPM   Encounter Date: 08/30/2021   PT End of Session - 08/30/21 0945     Visit Number 3    Number of Visits 12    Date for PT Re-Evaluation 09/19/21    Authorization Type medicare  and secondary [employer]    PT Start Time 0945    PT Stop Time 1030    PT Time Calculation (min) 45 min    Activity Tolerance Patient limited by pain    Behavior During Therapy [facility] for tasks assessed/performed             Past Medical History:  Diagnosis Date   Arthritis    Atrial fibrillation ([place])    Collagen vascular disease ([place])    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 screening colonoscopy in 10 years 2. She should follow a high fiber diet She has been given a hand out on high fiber diverticulosois and hemorrhoids   COLONOSCOPY N/A 02/07/2018   Procedure: COLONOSCOPY;  Surgeon: Danie Binder, MD;  [facility];  Service: Endoscopy;  Laterality: N/A;  1:00pm   CYSTECTOMY     Left breast   ESOPHAGOGASTRODUODENOSCOPY     KWI:[id number] appearing Schatzki's ring, possible cervical esophageal web, both dilated with passage of a  Maloney dilator/small hiatal hernia   ESOPHAGOGASTRODUODENOSCOPY N/A 10/25/2015   Procedure: ESOPHAGOGASTRODUODENOSCOPY (EGD);  Surgeon: Danie Binder, MD;  [facility];  Service: Endoscopy;  Laterality: N/A;  1215pm   ESOPHAGOGASTRODUODENOSCOPY N/A 10/24/2019   Schatzki's ring s/p dilation and single medium firm, probable submucosal nodule without bleeding. Medium sized hiatal hernia, normal duodenum.    ESOPHAGOGASTRODUODENOSCOPY N/A 04/05/2020   10 mm leiomyoma and capsule study without obvious lesion or bleeding.   EUS  11/2019   Dr. Paulita Fujita: EUS completed March 2021 with likely leiomyoma, no other concerns. 10 mm by 10 mm.    GIVENS CAPSULE STUDY N/A 04/06/2020   Procedure: GIVENS CAPSULE STUDY;  Surgeon: Rogene Houston, MD;  [facility];  Service: Endoscopy;  Laterality: N/A;   POLYPECTOMY  02/07/2018   Procedure: POLYPECTOMY;  Surgeon: Danie Binder, MD;  [facility];  Service: Endoscopy;;  ascending colon, descending, sigmoid   SAVORY DILATION N/A 10/25/2015   Procedure: SAVORY DILATION;  Surgeon: Danie Binder, MD;  [facility];  Service: Endoscopy;  Laterality: N/A;   SAVORY DILATION N/A 10/24/2019   Procedure: SAVORY DILATION;  Surgeon: Danie Binder, MD;  [facility];  Service: Endoscopy;  Laterality: N/A;   THYROIDECTOMY  Tumor   05/2010   Left breast under nipple   VESICOVAGINAL FISTULA CLOSURE W/ TAH     fibroid tumors    There were no vitals filed for this visit.   Subjective Assessment - 08/30/21 0948     Subjective "Ankle feels a little better where I haven't been on my feet much since I've been sick"    Currently in Pain? Yes    Pain Score 5     Pain Location Ankle    Pain Orientation Left    Pain Descriptors / Indicators Aching;Sore    Pain Type Chronic pain                               [facility] Adult PT Treatment/Exercise - 08/30/21 0001       Ankle Exercises: Standing   Heel  Raises Both;20 reps   ball between heels     Ankle Exercises: Seated   Heel Raises Right;Left;15 reps    Other Seated Ankle Exercises ankle INV/EV B x15 5" holds "windshied wipers" with towel    Other Seated Ankle Exercises sit to stands no UE 10X      Ankle Exercises: Stretches   Gastroc Stretch 2 reps;60 seconds                     PT Education - 08/30/21 1008     Education Details discussion of a more supportive orthotic to control rear foot eversion. Frozen water bottle foot massage    Person(s) Educated Patient    Methods Explanation;Handout    Comprehension Verbalized understanding;Need further instruction              PT Short Term Goals - 08/11/21 1200       PT SHORT TERM GOAL #1   Title Patient will be independent in self management strategies to improve quality of life and functional outcomes.    Time 3    Period Weeks    Status On-going    Target Date 08/29/21      PT SHORT TERM GOAL #2   Title Patient will report at least 25% improvement in overall symptoms and/or function to demonstrate improved functional mobility    Time 3    Period Weeks    Status On-going    Target Date 08/29/21      PT SHORT TERM GOAL #3   Title Patient will be able to ambulate at least 5 minutes without increase in foot/ankle pain    Time 3    Period Weeks    Status On-going    Target Date 08/29/21               PT Long Term Goals - 08/11/21 1200       PT LONG TERM GOAL #1   Title Patient will report at least 50% improvement in overall symptoms and/or function to demonstrate improved functional mobility    Time 6    Period Weeks    Status On-going      PT LONG TERM GOAL #2   Title Patient will meet predicted FOTO score to demonstrate improved overall function.    Time 6    Period Weeks    Status On-going      PT LONG TERM GOAL #3   Title Patietn will be able to demonstrate 10 heel raises (double leg) without compensatory anterior weight shift to  demonstrate improved LE strength.  Baseline unable at this time    Time 6    Period Weeks    Status On-going                   Plan - 08/30/21 1028     Clinical Impression Statement dmeonstrates signifiant rearfoot and midfoot eversion with calcaneal abduction. Fairly flexible foot appearance with ability to almost poassivbely position subtalar neutral. May benefit from more supportive orthotic.  Difficulty in activating post. tib with direct contratcion. IMproved recuitment with ball between ankles. Continued sessions to improve foot control, strengt, and flexibility    Personal Factors and Comorbidities Comorbidity 1;Comorbidity 2    Comorbidities B TKA    Examination-Activity Limitations Squat;Stairs;Stand;Transfers;Locomotion Level    Examination-Participation Restrictions Laundry;Meal Prep;Community Activity;Shop;Cleaning    Stability/Clinical Decision Making Stable/Uncomplicated    Rehab Potential Fair    PT Frequency 2x / week    PT Duration 6 weeks    PT Treatment/Interventions ADLs/Self Care Home Management;Iontophoresis 4mg /ml Dexamethasone;Moist Heat;Traction;Therapeutic exercise;Manual techniques;Therapeutic activities;Cryotherapy;DME Instruction;Gait training;Stair training;Dry needling;Joint Manipulations;Neuromuscular re-education;Patient/family education    PT Next Visit Plan continue with foot, ankle hip and general LE stengthening.    PT Home Exercise Plan compression socks, inversion    Consulted and Agree with Plan of Care Patient             Patient will benefit from skilled therapeutic intervention in order to improve the following deficits and impairments:  Abnormal gait, Decreased endurance, Increased edema, Decreased knowledge of use of DME, Decreased activity tolerance, Decreased balance, Decreased mobility, Decreased range of motion, Difficulty walking, Decreased strength, Pain  Visit Diagnosis: Difficulty in walking, not elsewhere  classified  Pain in left ankle and joints of left foot  Pain in right ankle and joints of right foot     Problem List Patient Active Problem List   Diagnosis Date Noted   Melena    Upper GI bleeding 04/05/2020   Heme positive stool 04/05/2020   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/03/2020   BMI 30.0-30.9,adult 10/16/2019   Rectal bleeding    Hypercalcemia 02/01/2018   BRBPR (bright red blood per rectum) 11/29/2017   Cognitive dysfunction 10/30/2017   Dysphagia, pharyngeal 05/24/2017   Mass of left upper extremity 05/14/2015   Hyperglycemia 10/12/2014   Ganglion cyst 09/26/2013   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 [place], [place], [address] Phone: [phone]   Fax:  [phone]  Name: BERKELEY VELDMAN MRN: [id number] Date of Birth: [birth year]

## 2021-09-02 ENCOUNTER — Other Ambulatory Visit: Payer: Self-pay

## 2021-09-02 ENCOUNTER — Ambulatory Visit (HOSPITAL_COMMUNITY): Payer: Medicare Other

## 2021-09-02 DIAGNOSIS — M25571 Pain in right ankle and joints of right foot: Secondary | ICD-10-CM

## 2021-09-02 DIAGNOSIS — R262 Difficulty in walking, not elsewhere classified: Secondary | ICD-10-CM

## 2021-09-02 DIAGNOSIS — M25572 Pain in left ankle and joints of left foot: Secondary | ICD-10-CM | POA: Diagnosis not present

## 2021-09-02 NOTE — Therapy (Signed)
[place], [place], [address] Phone: [phone]   Fax:  [phone]  Physical Therapy Treatment  Patient Details  Name: Melissa Barajas MRN: [id number] Date of Birth: [birth year] Referring Provider (PT): Criselda Peaches, DPM   Encounter Date: 09/02/2021   PT End of Session - 09/02/21 0957     Visit Number 4    Number of Visits 12    Date for PT Re-Evaluation 09/19/21    Authorization Type medicare  and secondary A[employer]    PT Start Time 0945    PT Stop Time 1030    PT Time Calculation (min) 45 min    Activity Tolerance Patient limited by pain    Behavior During Therapy [facility] for tasks assessed/performed             Past Medical History:  Diagnosis Date   Arthritis    Atrial fibrillation ([place])    Collagen vascular disease ([place])    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 screening colonoscopy in 10 years 2. She should follow a high fiber diet She has been given a hand out on high fiber diverticulosois and hemorrhoids   COLONOSCOPY N/A 02/07/2018   Procedure: COLONOSCOPY;  Surgeon: Danie Binder, MD;  [facility];  Service: Endoscopy;  Laterality: N/A;  1:00pm   CYSTECTOMY     Left breast   ESOPHAGOGASTRODUODENOSCOPY     ULA:[id number] appearing Schatzki's ring, possible cervical esophageal web, both dilated with passage of a  Maloney dilator/small hiatal hernia   ESOPHAGOGASTRODUODENOSCOPY N/A 10/25/2015   Procedure: ESOPHAGOGASTRODUODENOSCOPY (EGD);  Surgeon: Danie Binder, MD;  [facility];  Service: Endoscopy;  Laterality: N/A;  1215pm   ESOPHAGOGASTRODUODENOSCOPY N/A 10/24/2019   Schatzki's ring s/p dilation and single medium firm, probable submucosal nodule without bleeding. Medium sized hiatal hernia, normal duodenum.    ESOPHAGOGASTRODUODENOSCOPY N/A 04/05/2020   10 mm leiomyoma and capsule study without obvious lesion or bleeding.   EUS  11/2019   Dr. Paulita Fujita: EUS completed March 2021 with likely leiomyoma, no other concerns. 10 mm by 10 mm.    GIVENS CAPSULE STUDY N/A 04/06/2020   Procedure: GIVENS CAPSULE STUDY;  Surgeon: Rogene Houston, MD;  [facility];  Service: Endoscopy;  Laterality: N/A;   POLYPECTOMY  02/07/2018   Procedure: POLYPECTOMY;  Surgeon: Danie Binder, MD;  [facility];  Service: Endoscopy;;  ascending colon, descending, sigmoid   SAVORY DILATION N/A 10/25/2015   Procedure: SAVORY DILATION;  Surgeon: Danie Binder, MD;  [facility];  Service: Endoscopy;  Laterality: N/A;   SAVORY DILATION N/A 10/24/2019   Procedure: SAVORY DILATION;  Surgeon: Danie Binder, MD;  [facility];  Service: Endoscopy;  Laterality: N/A;   THYROIDECTOMY  Tumor   05/2010   Left breast under nipple   VESICOVAGINAL FISTULA CLOSURE W/ TAH     fibroid tumors    There were no vitals filed for this visit.   Subjective Assessment - 09/02/21 1028     Subjective No increase in ankle swelling with activities    Currently in Pain? Yes    Pain Score 5     Pain Location Ankle    Pain Orientation Left    Pain Descriptors / Indicators Aching;Sore    Pain Type Chronic pain                               [facility] Adult PT Treatment/Exercise - 09/02/21 0001       Ankle Exercises: Standing   Heel Raises Both;Other (comment)   3x10 with  ball between heels   Other Standing Ankle Exercises mini-squats on airex pad 2x10 reps with mirror for feedback and cues ofr hip/knee ER vs dynamic valgus. Difficulty in control foot pronation/eversion/ER      Ankle Exercises: Stretches   Gastroc Stretch 3 reps;60 seconds   towel   Slant Board Stretch 3 reps;60 seconds      Ankle Exercises: Seated   Other Seated Ankle Exercises ankle INV/EV B x15 5" holds "windshied wipers" with towel with emphasis on stablizing proximal knee to control compensation                       PT Short Term Goals - 08/11/21 1200       PT SHORT TERM GOAL #1   Title Patient will be independent in self management strategies to improve quality of life and functional outcomes.    Time 3    Period Weeks    Status On-going    Target Date 08/29/21      PT SHORT TERM GOAL #2   Title Patient will report at least 25% improvement in overall symptoms and/or function to demonstrate improved functional mobility    Time 3    Period Weeks    Status On-going    Target Date 08/29/21      PT SHORT TERM GOAL #3   Title Patient will be able to ambulate at least 5 minutes without increase in foot/ankle pain    Time 3    Period Weeks    Status On-going    Target Date 08/29/21               PT Long Term Goals - 08/11/21 1200       PT LONG TERM GOAL #1   Title Patient will report at least 50% improvement in overall symptoms and/or function to demonstrate improved functional mobility    Time 6    Period Weeks    Status On-going      PT LONG TERM GOAL #2   Title Patient will meet predicted FOTO score to demonstrate improved overall function.    Time 6    Period Weeks    Status On-going      PT LONG TERM GOAL #3   Title Patietn will be able to demonstrate 10 heel raises (double leg) without compensatory anterior weight shift to demonstrate improved LE strength.    Baseline unable at this time    Time 6    Period Weeks    Status On-going  Plan - 09/02/21 1028     Clinical Impression Statement Difficulty with controlling dynamic valgus requiring multi-modal cues with improved performance but requiring segmental task perormance, e.g. focus on proximal or distal, unable to coordinate proximal and distal control together e.g. dynamic valgus and foot supination maintained or no valgus and over-pronation. Continued sessions indicated to improve motor control and progress HEP for PTTD mgmt    Personal Factors and Comorbidities Comorbidity 1;Comorbidity 2    Comorbidities B TKA    Examination-Activity Limitations Squat;Stairs;Stand;Transfers;Locomotion Level    Examination-Participation Restrictions Laundry;Meal Prep;Community Activity;Shop;Cleaning    Stability/Clinical Decision Making Stable/Uncomplicated    Rehab Potential Fair    PT Frequency 2x / week    PT Duration 6 weeks    PT Treatment/Interventions ADLs/Self Care Home Management;Iontophoresis 4mg /ml Dexamethasone;Moist Heat;Traction;Therapeutic exercise;Manual techniques;Therapeutic activities;Cryotherapy;DME Instruction;Gait training;Stair training;Dry needling;Joint Manipulations;Neuromuscular re-education;Patient/family education    PT Next Visit Plan continue with foot, ankle hip and general LE stengthening.    PT Home Exercise Plan compression socks, inversion    Consulted and Agree with Plan of Care Patient             Patient will benefit from skilled therapeutic intervention in order to improve the following deficits and impairments:  Abnormal gait, Decreased endurance, Increased edema, Decreased knowledge of use of DME, Decreased activity tolerance, Decreased balance, Decreased mobility, Decreased range of motion, Difficulty walking, Decreased strength, Pain  Visit Diagnosis: Difficulty in walking, not elsewhere classified  Pain in left ankle and joints of left foot  Pain in right ankle and joints of right foot     Problem  List Patient Active Problem List   Diagnosis Date Noted   Melena    Upper GI bleeding 04/05/2020   Heme positive stool 04/05/2020   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/03/2020   BMI 30.0-30.9,adult 10/16/2019   Rectal bleeding    Hypercalcemia 02/01/2018   BRBPR (bright red blood per rectum) 11/29/2017   Cognitive dysfunction 10/30/2017   Dysphagia, pharyngeal 05/24/2017   Mass of left upper extremity 05/14/2015   Hyperglycemia 10/12/2014   Ganglion cyst 09/26/2013   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

## 2021-09-05 ENCOUNTER — Other Ambulatory Visit: Payer: Self-pay | Admitting: Family Medicine

## 2021-09-06 ENCOUNTER — Ambulatory Visit (HOSPITAL_COMMUNITY): Payer: Medicare Other

## 2021-09-06 ENCOUNTER — Other Ambulatory Visit: Payer: Self-pay

## 2021-09-06 DIAGNOSIS — M25572 Pain in left ankle and joints of left foot: Secondary | ICD-10-CM

## 2021-09-06 DIAGNOSIS — Z1231 Encounter for screening mammogram for malignant neoplasm of breast: Secondary | ICD-10-CM | POA: Diagnosis not present

## 2021-09-06 DIAGNOSIS — M25571 Pain in right ankle and joints of right foot: Secondary | ICD-10-CM

## 2021-09-06 DIAGNOSIS — R262 Difficulty in walking, not elsewhere classified: Secondary | ICD-10-CM

## 2021-09-06 NOTE — Therapy (Signed)
[place], [place], [address] Phone: [phone]   Fax:  [phone]  Physical Therapy Treatment  Patient Details  Name: Melissa Barajas MRN: [id number] Date of Birth: 1941/04/12 Referring Provider (PT): Criselda Peaches, DPM   Encounter Date: 09/06/2021   PT End of Session - 09/06/21 1424     Visit Number 5    Number of Visits 12    Date for PT Re-Evaluation 09/19/21    Authorization Type medicare  and secondary A[employer]    PT Start Time 1430    PT Stop Time 6962    PT Time Calculation (min) 45 min    Activity Tolerance Patient limited by pain    Behavior During Therapy [facility] for tasks assessed/performed             Past Medical History:  Diagnosis Date   Arthritis    Atrial fibrillation ([place])    Collagen vascular disease ([place])    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 screening colonoscopy in 10 years 2. She should follow a high fiber diet She has been given a hand out on high fiber diverticulosois and hemorrhoids   COLONOSCOPY N/A 02/07/2018   Procedure: COLONOSCOPY;  Surgeon: Danie Binder, MD;  [facility];  Service: Endoscopy;  Laterality: N/A;  1:00pm   CYSTECTOMY     Left breast   ESOPHAGOGASTRODUODENOSCOPY     [id number] appearing Schatzki's ring, possible cervical esophageal web, both dilated with passage of a  Maloney dilator/small hiatal hernia   ESOPHAGOGASTRODUODENOSCOPY N/A 10/25/2015   Procedure: ESOPHAGOGASTRODUODENOSCOPY (EGD);  Surgeon: Danie Binder, MD;  [facility];  Service: Endoscopy;  Laterality: N/A;  1215pm   ESOPHAGOGASTRODUODENOSCOPY N/A 10/24/2019   Schatzki's ring s/p dilation and single medium firm, probable submucosal nodule without bleeding. Medium sized hiatal hernia, normal duodenum.    ESOPHAGOGASTRODUODENOSCOPY N/A 04/05/2020   10 mm leiomyoma and capsule study without obvious lesion or bleeding.   EUS  11/2019   Dr. Paulita Fujita: EUS completed March 2021 with likely leiomyoma, no other concerns. 10 mm by 10 mm.    GIVENS CAPSULE STUDY N/A 04/06/2020   Procedure: GIVENS CAPSULE STUDY;  Surgeon: Rogene Houston, MD;  [facility];  Service: Endoscopy;  Laterality: N/A;   POLYPECTOMY  02/07/2018   Procedure: POLYPECTOMY;  Surgeon: Danie Binder, MD;  [facility];  Service: Endoscopy;;  ascending colon, descending, sigmoid   SAVORY DILATION N/A 10/25/2015   Procedure: SAVORY DILATION;  Surgeon: Danie Binder, MD;  [facility];  Service: Endoscopy;  Laterality: N/A;   SAVORY DILATION N/A 10/24/2019   Procedure: SAVORY DILATION;  Surgeon: Danie Binder, MD;  [facility];  Service: Endoscopy;  Laterality: N/A;   THYROIDECTOMY  Tumor   05/2010   Left breast under nipple   VESICOVAGINAL FISTULA CLOSURE W/ TAH     fibroid tumors    There were no vitals filed for this visit.   Subjective Assessment - 09/06/21 1427     Subjective able to do more around the house without feet bothering too much over Xmas    Currently in Pain? Yes    Pain Score 0-No pain    Pain Location Ankle    Pain Orientation Left                               [facility] Adult PT Treatment/Exercise - 09/06/21 0001       Ankle Exercises: Seated   Heel Raises Right;Left;15 reps    Other Seated Ankle Exercises ankle INV/EV B x15 5"  holds "windshied wipers" with towel with emphasis on stablizing proximal knee to control compensation      Ankle Exercises: Standing   SLS 2x10 trials maintaining 3-6 sec eachon airex pad    Heel Raises Both;Other (comment)   ball between heels for cue   Other Standing Ankle Exercises gastroc eccentrics on 2" step 5x5      Ankle Exercises: Stretches   Slant Board Stretch 4 reps;60 seconds                       PT Short Term Goals - 08/11/21 1200       PT SHORT TERM GOAL #1   Title Patient will be independent in self management strategies to improve quality of life and functional outcomes.    Time 3    Period Weeks    Status On-going    Target Date 08/29/21      PT SHORT TERM GOAL #2   Title Patient will report at least 25% improvement in overall symptoms and/or function to demonstrate improved functional mobility    Time 3    Period Weeks    Status On-going    Target Date 08/29/21      PT SHORT TERM GOAL #3   Title Patient will be able to ambulate at least 5 minutes without increase in foot/ankle pain    Time 3    Period Weeks    Status On-going    Target Date 08/29/21               PT Long Term Goals - 08/11/21 1200       PT LONG TERM GOAL #1   Title Patient will report at least 50% improvement in overall symptoms and/or function to demonstrate improved functional mobility    Time 6    Period Weeks    Status On-going      PT LONG TERM GOAL #2   Title Patient will meet predicted FOTO score to demonstrate improved overall function.    Time 6    Period Weeks    Status On-going      PT LONG TERM GOAL #3   Title Patietn will be able to demonstrate 10 heel raises (double leg) without compensatory anterior weight shift to demonstrate improved LE strength.    Baseline unable at this time    Time 6    Period Weeks    Status On-going                   Plan - 09/06/21 1453     Clinical Impression Statement initiated plantarflexion  eccentrics at low volume 5x5 reps with 2" rise and BUE support needed but progressing well with movement.  Pt notes improved activity tolerance with less left ankle pain at present.  Continued sessions indicated to progress current HEP for correction of biomechanical faults and improve foot intrinsics/stabilization to improve proximal to distal control    Personal Factors and Comorbidities Comorbidity 1;Comorbidity 2    Comorbidities B TKA    Examination-Activity Limitations Squat;Stairs;Stand;Transfers;Locomotion Level    Examination-Participation Restrictions Laundry;Meal Prep;Community Activity;Shop;Cleaning    Stability/Clinical Decision Making Stable/Uncomplicated    Rehab Potential Fair    PT Frequency 2x / week    PT Duration 6 weeks    PT Treatment/Interventions ADLs/Self Care Home Management;Iontophoresis 4mg /ml Dexamethasone;Moist Heat;Traction;Therapeutic exercise;Manual techniques;Therapeutic activities;Cryotherapy;DME Instruction;Gait training;Stair training;Dry needling;Joint Manipulations;Neuromuscular re-education;Patient/family education    PT Next Visit Plan continue with foot, ankle hip and general LE stengthening.    PT Home Exercise Plan compression socks, inversion, calf eccentrics with 2" step, calf stretch    Consulted and Agree with Plan of Care Patient             Patient will benefit from skilled therapeutic intervention in order to improve the following deficits and impairments:  Abnormal gait, Decreased endurance, Increased edema, Decreased knowledge of use of DME, Decreased activity tolerance, Decreased balance, Decreased mobility, Decreased range of motion, Difficulty walking, Decreased strength, Pain  Visit Diagnosis: Difficulty in walking, not elsewhere classified  Pain in left ankle and joints of left foot  Pain in right ankle and joints of right foot     Problem List Patient Active Problem List   Diagnosis Date Noted   Melena    Upper GI bleeding  04/05/2020   Heme positive stool 04/05/2020   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/03/2020   BMI 30.0-30.9,adult 10/16/2019   Rectal bleeding    Hypercalcemia 02/01/2018   BRBPR (bright red blood per rectum) 11/29/2017   Cognitive dysfunction 10/30/2017   Dysphagia, pharyngeal 05/24/2017   Mass of left upper extremity 05/14/2015   Hyperglycemia 10/12/2014   Ganglion cyst 09/26/2013   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 [place], [place], [address] Phone: [phone]   Fax:  [phone]  Name: GARIMA CHRONIS MRN: [id number] Date of Birth: Mar 09, 1941

## 2021-09-08 ENCOUNTER — Encounter (HOSPITAL_COMMUNITY): Payer: Self-pay

## 2021-09-08 ENCOUNTER — Ambulatory Visit (HOSPITAL_COMMUNITY): Payer: Medicare Other

## 2021-09-08 ENCOUNTER — Other Ambulatory Visit: Payer: Self-pay

## 2021-09-08 DIAGNOSIS — M25571 Pain in right ankle and joints of right foot: Secondary | ICD-10-CM | POA: Diagnosis not present

## 2021-09-08 DIAGNOSIS — R262 Difficulty in walking, not elsewhere classified: Secondary | ICD-10-CM | POA: Diagnosis not present

## 2021-09-08 DIAGNOSIS — M25572 Pain in left ankle and joints of left foot: Secondary | ICD-10-CM | POA: Diagnosis not present

## 2021-09-08 NOTE — Therapy (Signed)
Melissa Barajas [address] [place], [place], [address] Phone: [phone]   Fax:  [phone]  Physical Therapy Treatment  Patient Details  Name: Melissa Barajas MRN: [id number] Date of Birth: 1941-03-16 Referring Provider (PT): Criselda Peaches, DPM   Encounter Date: 09/08/2021   PT End of Session - 09/08/21 0925     Visit Number 6    Number of Visits 12    Date for PT Re-Evaluation 09/19/21    Authorization Type medicare  and secondary A[employer]    PT Start Time 0920    PT Stop Time 0959    PT Time Calculation (min) 39 min    Activity Tolerance Patient tolerated treatment well    Behavior During Therapy [facility] for tasks assessed/performed             Past Medical History:  Diagnosis Date   Arthritis    Atrial fibrillation ([place])    Collagen vascular disease ([place])    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 screening colonoscopy in 10 years 2. She should follow a high fiber diet She has been given a hand out on high fiber diverticulosois and hemorrhoids   COLONOSCOPY N/A 02/07/2018   Procedure: COLONOSCOPY;  Surgeon: Danie Binder, MD;  [facility];  Service: Endoscopy;  Laterality: N/A;  1:00pm   CYSTECTOMY     Left breast   ESOPHAGOGASTRODUODENOSCOPY     [id number] appearing Schatzki's ring, possible cervical esophageal web, both dilated with passage  of a Maloney dilator/small hiatal hernia   ESOPHAGOGASTRODUODENOSCOPY N/A 10/25/2015   Procedure: ESOPHAGOGASTRODUODENOSCOPY (EGD);  Surgeon: Danie Binder, MD;  [facility];  Service: Endoscopy;  Laterality: N/A;  1215pm   ESOPHAGOGASTRODUODENOSCOPY N/A 10/24/2019   Schatzki's ring s/p dilation and single medium firm, probable submucosal nodule without bleeding. Medium sized hiatal hernia, normal duodenum.    ESOPHAGOGASTRODUODENOSCOPY N/A 04/05/2020   10 mm leiomyoma and capsule study without obvious lesion or bleeding.   EUS  11/2019   Dr. Paulita Fujita: EUS completed March 2021 with likely leiomyoma, no other concerns. 10 mm by 10 mm.    GIVENS CAPSULE STUDY N/A 04/06/2020   Procedure: GIVENS CAPSULE STUDY;  Surgeon: Rogene Houston, MD;  [facility];  Service: Endoscopy;  Laterality: N/A;   POLYPECTOMY  02/07/2018   Procedure: POLYPECTOMY;  Surgeon: Danie Binder, MD;  [facility];  Service: Endoscopy;;  ascending colon, descending, sigmoid   SAVORY DILATION N/A 10/25/2015   Procedure: SAVORY DILATION;  Surgeon: Danie Binder, MD;  [facility];  Service: Endoscopy;  Laterality: N/A;   SAVORY DILATION N/A 10/24/2019   Procedure: SAVORY DILATION;  Surgeon: Danie Binder, MD;  [facility];  Service: Endoscopy;  Laterality: N/A;   THYROIDECTOMY  Tumor   05/2010   Left breast under nipple   VESICOVAGINAL FISTULA CLOSURE W/ TAH     fibroid tumors    There were no vitals filed for this visit.   Subjective Assessment - 09/08/21 0925     Subjective Reports she is feeling stronger, has been compliant with HEP    Patient Stated Goals to have less pain    Currently in Pain? No/denies                               [facility] Adult PT Treatment/Exercise - 09/08/21 0001       Ankle Exercises: Standing   SLS ball between heels, 3" holds    Heel Raises Both;Other (comment);15 reps;3 seconds    Other Standing Ankle  Exercises gastroc eccentrics on 2" step 5x5 3" eccentric lowering      Ankle Exercises: Stretches   Slant Board Stretch 3 reps;30 seconds      Ankle Exercises: Supine   T-Band RTB all directions seated with foot on 12in step                       PT Short Term Goals - 08/11/21 1200       PT SHORT TERM GOAL #1   Title Patient will be independent in self management strategies to improve quality of life and functional outcomes.    Time 3    Period Weeks    Status On-going    Target Date 08/29/21      PT SHORT TERM GOAL #2   Title Patient will report at least 25% improvement in overall symptoms and/or function to demonstrate improved functional mobility    Time 3    Period Weeks    Status On-going    Target Date 08/29/21      PT SHORT TERM GOAL #3   Title Patient will be able to ambulate at least 5 minutes without increase in foot/ankle pain    Time 3    Period Weeks    Status On-going    Target Date 08/29/21               PT Long Term Goals - 08/11/21 1200       PT LONG TERM GOAL #1   Title Patient will report at least 50% improvement in overall symptoms and/or function to demonstrate improved functional mobility    Time 6    Period Weeks    Status On-going      PT LONG TERM GOAL #2   Title Patient will meet predicted FOTO score to demonstrate improved overall function.    Time 6    Period Weeks    Status On-going      PT LONG TERM GOAL #3   Title Patietn will be able to demonstrate 10 heel raises (double leg) without compensatory anterior weight shift to demonstrate improved LE strength.    Baseline unable at this time    Time 6    Period Weeks    Status On-going                   Plan - 09/08/21 1004     Clinical Impression Statement Added theraband for ankle strengthening with min cueing to reduce compensation with knee mechanics.  Continued standing gastroc strengthening and stretches to improve ankle mobility.  Pt tolerated  well to session with no reports of pain through session.  Did require verbal and tactile cueing to improve form with heel raises and cueing to go straight up vs leaning forward and to reduce UE support for strengthening.    Personal Factors and Comorbidities Comorbidity 1;Comorbidity 2    Comorbidities B TKA    Examination-Activity Limitations Squat;Stairs;Stand;Transfers;Locomotion Level    Examination-Participation Restrictions Laundry;Meal Prep;Community Activity;Shop;Cleaning    Stability/Clinical Decision Making Stable/Uncomplicated    Clinical Decision Making Low    Rehab Potential Fair    PT Frequency 2x / week    PT Duration 6 weeks    PT Treatment/Interventions ADLs/Self Care Home Management;Iontophoresis 4mg /ml Dexamethasone;Moist Heat;Traction;Therapeutic exercise;Manual techniques;Therapeutic activities;Cryotherapy;DME Instruction;Gait training;Stair training;Dry needling;Joint Manipulations;Neuromuscular re-education;Patient/family education    PT Next Visit Plan continue with foot, ankle hip and general LE stengthening.    PT Home Exercise Plan compression socks, inversion, calf eccentrics with 2" step, calf stretch; 12/29:  RTB all directions    Consulted and Agree with Plan of Care Patient             Patient will benefit from skilled therapeutic intervention in order to improve the following deficits and impairments:  Abnormal gait, Decreased endurance, Increased edema, Decreased knowledge of use of DME, Decreased activity tolerance, Decreased balance, Decreased mobility, Decreased range of motion, Difficulty walking, Decreased strength, Pain  Visit Diagnosis: Difficulty in walking, not elsewhere classified  Pain in left ankle and joints of left foot  Pain in right ankle and joints of right foot     Problem List Patient Active Problem List   Diagnosis Date Noted   Melena    Upper GI bleeding 04/05/2020   Heme positive stool 04/05/2020   Leiomyoma of stomach  02/03/2020   BMI 30.0-30.9,adult 10/16/2019   Rectal bleeding    Hypercalcemia 02/01/2018   BRBPR (bright red blood per rectum) 11/29/2017   Cognitive dysfunction 10/30/2017   Dysphagia, pharyngeal 05/24/2017   Mass of left upper extremity 05/14/2015   Hyperglycemia 10/12/2014   Ganglion cyst 09/26/2013   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

## 2021-09-13 ENCOUNTER — Other Ambulatory Visit: Payer: Self-pay

## 2021-09-13 ENCOUNTER — Ambulatory Visit (HOSPITAL_COMMUNITY): Payer: Medicare Other | Attending: Podiatry

## 2021-09-13 DIAGNOSIS — M25572 Pain in left ankle and joints of left foot: Secondary | ICD-10-CM | POA: Insufficient documentation

## 2021-09-13 DIAGNOSIS — R262 Difficulty in walking, not elsewhere classified: Secondary | ICD-10-CM | POA: Insufficient documentation

## 2021-09-13 DIAGNOSIS — M25571 Pain in right ankle and joints of right foot: Secondary | ICD-10-CM | POA: Insufficient documentation

## 2021-09-13 NOTE — Therapy (Signed)
[place] °[facility] °[address] °[place], [place], [address] °Phone: [phone]   Fax:  [phone] ° °Physical Therapy Treatment ° °Patient Details  °Name: Melissa Barajas °MRN: [id number] °Date of Birth: 08/29/1941 °Referring Provider (PT): Adam R McDonald, DPM ° ° °Encounter Date: 09/13/2021 ° ° PT End of Session - 09/13/21 1113   ° ° Visit Number 7   ° Number of Visits 12   ° Date for PT Re-Evaluation 09/19/21   ° Authorization Type medicare  and secondary AARP   ° PT Start Time 1115   ° PT Stop Time 1200   ° PT Time Calculation (min) 45 min   ° Activity Tolerance Patient tolerated treatment well   ° Behavior During Therapy WFL for tasks assessed/performed   ° °  °  ° °  ° ° °Past Medical History:  °Diagnosis Date  ° Arthritis   ° Atrial fibrillation (HCC)   ° Collagen vascular disease (HCC)   ° DIVERTICULOSIS OF COLON 08/25/2010  ° Qualifier: Diagnosis of  By: Watson, Leighann    ° GERD (gastroesophageal reflux disease)   ° HEMORRHOIDS, INTERNAL 08/25/2010  ° Qualifier: Diagnosis of  By: Watson, Leighann    ° Hypercholesteremia   ° Hypertension   ° 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/03/2020  ° E[facility] and E[facility] 01/2020  ° Lipoma of arm   ° Left  ° ° °Past Surgical History:  °Procedure Laterality Date  ° ABDOMINAL HYSTERECTOMY    ° Partial  ° BIOPSY  10/24/2019  ° Procedure: BIOPSY;  Surgeon: Fields, Sandi L, MD;  [facility];  Service: Endoscopy;;  ° COLONOSCOPY  2009  ° SLF: 1. screening colonoscopy in 10 years 2. She should follow a high fiber diet She has been given a hand out on high fiber diverticulosois and hemorrhoids  ° COLONOSCOPY N/A 02/07/2018  ° Procedure: COLONOSCOPY;  Surgeon: Fields, Sandi L, MD;  [facility];  Service: Endoscopy;  Laterality: N/A;  1:00pm  ° CYSTECTOMY    ° Left breast  ° ESOPHAGOGASTRODUODENOSCOPY    ° RMR:Noncritical appearing Schatzki's ring, possible cervical esophageal web, both dilated with passage of  a Maloney dilator/small hiatal hernia  ° ESOPHAGOGASTRODUODENOSCOPY N/A 10/25/2015  ° Procedure: ESOPHAGOGASTRODUODENOSCOPY (EGD);  Surgeon: Sandi L Fields, MD;  [facility];  Service: Endoscopy;  Laterality: N/A;  1215pm  ° ESOPHAGOGASTRODUODENOSCOPY N/A 10/24/2019  ° Schatzki's ring s/p dilation and single medium firm, probable submucosal nodule without bleeding. Medium sized hiatal hernia, normal duodenum.   ° ESOPHAGOGASTRODUODENOSCOPY N/A 04/05/2020  ° 10 mm leiomyoma and capsule study without obvious lesion or bleeding.  ° EUS  11/2019  ° Dr. Outlaw: EUS completed March 2021 with likely leiomyoma, no other concerns. 10 mm by 10 mm.   ° GIVENS CAPSULE STUDY N/A 04/06/2020  ° Procedure: GIVENS CAPSULE STUDY;  Surgeon: Rehman, Najeeb U, MD;  [facility];  Service: Endoscopy;  Laterality: N/A;  ° POLYPECTOMY  02/07/2018  ° Procedure: POLYPECTOMY;  Surgeon: Fields, Sandi L, MD;  [facility];  Service: Endoscopy;;  ascending colon, descending, sigmoid  ° SAVORY DILATION N/A 10/25/2015  ° Procedure: SAVORY DILATION;  Surgeon: Sandi L Fields, MD;  [facility];  Service: Endoscopy;  Laterality: N/A;  ° SAVORY DILATION N/A 10/24/2019  ° Procedure: SAVORY DILATION;  Surgeon: Fields, Sandi L, MD;  [facility];  Service: Endoscopy;  Laterality: N/A;  ° THYROIDECTOMY    °   Tumor   05/2010  ° Left breast under nipple  ° VESICOVAGINAL FISTULA CLOSURE W/ TAH    ° fibroid tumors  ° ° °There were no vitals filed for this visit. ° ° Subjective Assessment - 09/13/21 1115   ° ° Subjective Feeling better overall, no increase in ankle swelling or pain   ° Patient Stated Goals to have less pain   ° Currently in Pain? No/denies   ° Pain Score 0-No pain   ° Pain Location Ankle   ° °  °  ° °  ° ° ° ° ° [facility] PT Assessment - 09/13/21 0001   ° °  ° Assessment  ° Medical Diagnosis post tib dysfunction   ° Referring Provider (PT) Adam R McDonald, DPM   ° °  °  ° °   ° ° ° ° ° ° ° ° ° ° ° ° ° ° ° ° [facility] Adult PT Treatment/Exercise - 09/13/21 0001   ° °  ° Ankle Exercises: Seated  ° Heel Raises Right;Left;15 reps   ° Other Seated Ankle Exercises ankle INV/EV B x15 5" holds "windshied wipers" with towel with emphasis on stablizing proximal knee to control compensation   °  ° Ankle Exercises: Standing  ° SLS on airex pad throwing ball 10x R/L   unable to maintain SLS  ° Heel Raises Both;Other (comment);3 seconds;20 reps   ball between ankles  ° Tai Chi static standing 4x30 sec airex pad eyes closed   ° Other Standing Ankle Exercises gastroc eccentrics on 4" step 5x5 3" eccentric lowering   °  ° Ankle Exercises: Stretches  ° Plantar Fascia Stretch 2 reps;60 seconds   edge of step  ° Gastroc Stretch 2 reps;60 seconds   ° Slant Board Stretch 3 reps;60 seconds   ° °  °  ° °  ° ° ° ° ° ° ° ° ° ° ° ° PT Short Term Goals - 08/11/21 1200   ° °  ° PT SHORT TERM GOAL #1  ° Title Patient will be independent in self management strategies to improve quality of life and functional outcomes.   ° Time 3   ° Period Weeks   ° Status On-going   ° Target Date 08/29/21   °  ° PT SHORT TERM GOAL #2  ° Title Patient will report at least 25% improvement in overall symptoms and/or function to demonstrate improved functional mobility   ° Time 3   ° Period Weeks   ° Status On-going   ° Target Date 08/29/21   °  ° PT SHORT TERM GOAL #3  ° Title Patient will be able to ambulate at least 5 minutes without increase in foot/ankle pain   ° Time 3   ° Period Weeks   ° Status On-going   ° Target Date 08/29/21   ° °  °  ° °  ° ° ° ° PT Long Term Goals - 08/11/21 1200   ° °  ° PT LONG TERM GOAL #1  ° Title Patient will report at least 50% improvement in overall symptoms and/or function to demonstrate improved functional mobility   ° Time 6   ° Period Weeks   ° Status On-going   °  ° PT LONG TERM GOAL #2  ° Title Patient will meet predicted FOTO score to demonstrate improved overall function.   ° Time 6   ° Period  Weeks   ° Status On-going   °  °   PT LONG TERM GOAL #3   Title Patietn will be able to demonstrate 10 heel raises (double leg) without compensatory anterior weight shift to demonstrate improved LE strength.    Baseline unable at this time    Time 6    Period Weeks    Status On-going                   Plan - 09/13/21 1157     Clinical Impression Statement Progressing very well with eccentric based ankle strengthening and tolerating without increased symptoms and no signs of active inflammation in post. tib tendon at this time.  Will progress to bent-knee strengthening techniuqes to further isolate to soleus/tib post.    Personal Factors and Comorbidities Comorbidity 1;Comorbidity 2    Comorbidities B TKA    Examination-Activity Limitations Squat;Stairs;Stand;Transfers;Locomotion Level    Examination-Participation Restrictions Laundry;Meal Prep;Community Activity;Shop;Cleaning    Stability/Clinical Decision Making Stable/Uncomplicated    Rehab Potential Fair    PT Frequency 2x / week    PT Duration 6 weeks    PT Treatment/Interventions ADLs/Self Care Home Management;Iontophoresis 26m/ml Dexamethasone;Moist Heat;Traction;Therapeutic exercise;Manual techniques;Therapeutic activities;Cryotherapy;DME Instruction;Gait training;Stair training;Dry needling;Joint Manipulations;Neuromuscular re-education;Patient/family education    PT Next Visit Plan bent-knee soleus/tibialis posterior eccentrics    PT Home Exercise Plan compression socks, inversion, calf eccentrics with 2" step, calf stretch; 12/29:  RTB all directions    Consulted and Agree with Plan of Care Patient             Patient will benefit from skilled therapeutic intervention in order to improve the following deficits and impairments:  Abnormal gait, Decreased endurance, Increased edema, Decreased knowledge of use of DME, Decreased activity tolerance, Decreased balance, Decreased mobility, Decreased range of motion, Difficulty  walking, Decreased strength, Pain  Visit Diagnosis: Difficulty in walking, not elsewhere classified  Pain in left ankle and joints of left foot  Pain in right ankle and joints of right foot     Problem List Patient Active Problem List   Diagnosis Date Noted   Melena    Upper GI bleeding 04/05/2020   Heme positive stool 04/05/2020   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/03/2020   BMI 30.0-30.9,adult 10/16/2019   Rectal bleeding    Hypercalcemia 02/01/2018   BRBPR (bright red blood per rectum) 11/29/2017   Cognitive dysfunction 10/30/2017   Dysphagia, pharyngeal 05/24/2017   Mass of left upper extremity 05/14/2015   Hyperglycemia 10/12/2014   Ganglion cyst 09/26/2013   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

## 2021-09-15 ENCOUNTER — Ambulatory Visit (HOSPITAL_COMMUNITY): Payer: Medicare Other

## 2021-09-20 ENCOUNTER — Ambulatory Visit (HOSPITAL_COMMUNITY): Payer: Medicare Other

## 2021-09-20 ENCOUNTER — Other Ambulatory Visit: Payer: Self-pay

## 2021-09-20 DIAGNOSIS — M25572 Pain in left ankle and joints of left foot: Secondary | ICD-10-CM | POA: Diagnosis not present

## 2021-09-20 DIAGNOSIS — R262 Difficulty in walking, not elsewhere classified: Secondary | ICD-10-CM | POA: Diagnosis not present

## 2021-09-20 DIAGNOSIS — M25571 Pain in right ankle and joints of right foot: Secondary | ICD-10-CM | POA: Diagnosis not present

## 2021-09-20 NOTE — Therapy (Signed)
[facility], [place], [address] Phone: [phone]   Fax:  [phone]  Physical Therapy Treatment and Recertification  Patient Details  Name: Melissa Barajas MRN: [id number] Date of Birth: [birth year] Referring Provider (PT): Criselda Peaches, DPM   Encounter Date: 09/20/2021   PT End of Session - 09/20/21 1125     Visit Number 8    Number of Visits 12    Date for PT Re-Evaluation 10/04/21    Authorization Type medicare  and secondary AARP    PT Start Time 1115    PT Stop Time 1200    PT Time Calculation (min) 45 min    Activity Tolerance Patient tolerated treatment well    Behavior During Therapy [facility] for tasks assessed/performed             Past Medical History:  Diagnosis Date   Arthritis    Atrial fibrillation ([place])    Collagen vascular disease ([place])    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 screening colonoscopy in 10 years 2. She should follow a high fiber diet She has been given a hand out on high fiber diverticulosois and hemorrhoids   COLONOSCOPY N/A 02/07/2018   Procedure: COLONOSCOPY;  Surgeon: Danie Binder, MD;  [facility];  Service: Endoscopy;  Laterality: N/A;  1:00pm   CYSTECTOMY     Left breast   ESOPHAGOGASTRODUODENOSCOPY     GLO:[id number] appearing Schatzki's ring, possible cervical esophageal web, both  dilated with passage of a Maloney dilator/small hiatal hernia   ESOPHAGOGASTRODUODENOSCOPY N/A 10/25/2015   Procedure: ESOPHAGOGASTRODUODENOSCOPY (EGD);  Surgeon: Danie Binder, MD;  [facility];  Service: Endoscopy;  Laterality: N/A;  1215pm   ESOPHAGOGASTRODUODENOSCOPY N/A 10/24/2019   Schatzki's ring s/p dilation and single medium firm, probable submucosal nodule without bleeding. Medium sized hiatal hernia, normal duodenum.    ESOPHAGOGASTRODUODENOSCOPY N/A 04/05/2020   10 mm leiomyoma and capsule study without obvious lesion or bleeding.   EUS  11/2019   Dr. Paulita Fujita: EUS completed March 2021 with likely leiomyoma, no other concerns. 10 mm by 10 mm.    GIVENS CAPSULE STUDY N/A 04/06/2020   Procedure: GIVENS CAPSULE STUDY;  Surgeon: Rogene Houston, MD;  [facility];  Service: Endoscopy;  Laterality: N/A;   POLYPECTOMY  02/07/2018   Procedure: POLYPECTOMY;  Surgeon: Danie Binder, MD;  [facility];  Service: Endoscopy;;  ascending colon, descending, sigmoid   SAVORY DILATION N/A 10/25/2015   Procedure: SAVORY DILATION;  Surgeon: Danie Binder, MD;  [facility];  Service: Endoscopy;  Laterality: N/A;   SAVORY DILATION N/A 10/24/2019   Procedure: SAVORY DILATION;  Surgeon: Danie Binder, MD;  [facility];  Service: Endoscopy;  Laterality: N/A;  THYROIDECTOMY     Tumor   05/2010   Left breast under nipple   VESICOVAGINAL FISTULA CLOSURE W/ TAH     fibroid tumors    There were no vitals filed for this visit.   Subjective Assessment - 09/20/21 1203     Subjective Pt notes overall improvement of 10%    Patient Stated Goals to have less pain    Currently in Pain? No/denies    Pain Score 0-No pain                [facility] PT Assessment - 09/20/21 0001       Observation/Other Assessments   Focus on Therapeutic Outcomes (FOTO)  66%                           [facility] Adult PT Treatment/Exercise - 09/20/21 0001        Ankle Exercises: Standing   SLS on airex pad throwing ball 10x R/L    Heel Raises Both;Other (comment)   3x10 with flexed knees for soleus/tib post   Other Standing Ankle Exercises soleus eccentrics 5x5      Ankle Exercises: Stretches   Plantar Fascia Stretch 2 reps;60 seconds    Soleus Stretch 2 reps;60 seconds    Gastroc Stretch 2 reps;60 seconds                     PT Education - 09/20/21 1203     Education Details education on footwear designs    Person(s) Educated Patient    Methods Explanation;Demonstration    Comprehension Verbalized understanding              PT Short Term Goals - 09/20/21 1140       PT SHORT TERM GOAL #1   Title Patient will be independent in self management strategies to improve quality of life and functional outcomes.    Baseline on-going, newly modified    Time 3    Period Weeks    Status On-going    Target Date 08/29/21      PT SHORT TERM GOAL #2   Title Patient will report at least 25% improvement in overall symptoms and/or function to demonstrate improved functional mobility    Baseline 10% improvement    Time 3    Period Weeks    Status On-going    Target Date 08/29/21      PT SHORT TERM GOAL #3   Title Patient will be able to ambulate at least 5 minutes without increase in foot/ankle pain    Baseline able to complete shopping trips 30-45 minutes without increased foot/ankle pain    Time 3    Period Weeks    Status Achieved    Target Date 08/29/21               PT Long Term Goals - 09/20/21 1146       PT LONG TERM GOAL #1   Title Patient will report at least 50% improvement in overall symptoms and/or function to demonstrate improved functional mobility    Time 6    Period Weeks    Status On-going      PT LONG TERM GOAL #2   Title Patient will meet predicted FOTO score to demonstrate improved overall function.    Baseline 66% function    Time 6    Period Weeks    Status Achieved      PT LONG  TERM GOAL #3   Title Patietn will be able to demonstrate 10 heel raises (double leg) without compensatory anterior weight shift to demonstrate improved LE strength.    Baseline 4/5 bilateral plantarflexion strength, performing eccentrics    Time 6    Period Weeks    Status Achieved                   Plan - 09/20/21 1204     Clinical Impression Statement Progressing well with POC details and reports minimal to no pain in feet/ankles and is able to perform shopping trips lasting 30-45 min without increase in pain. Improved FOTO score from 41 to 66% function.  Tolerating and performing isolated eccentrics for bilateral ankles well withot adverse effects. Will continue x 1 visit to re-assess HEP and then D/C    Personal Factors and Comorbidities Comorbidity 1;Comorbidity 2    Comorbidities B TKA    Examination-Activity Limitations Squat;Stairs;Stand;Transfers;Locomotion Level    Examination-Participation Restrictions Laundry;Meal Prep;Community Activity;Shop;Cleaning    Stability/Clinical Decision Making Stable/Uncomplicated    Rehab Potential Fair    PT Frequency 2x / week    PT Duration 6 weeks    PT Treatment/Interventions ADLs/Self Care Home Management;Iontophoresis 4mg /ml Dexamethasone;Moist Heat;Traction;Therapeutic exercise;Manual techniques;Therapeutic activities;Cryotherapy;DME Instruction;Gait training;Stair training;Dry needling;Joint Manipulations;Neuromuscular re-education;Patient/family education    PT Next Visit Plan re-assess, D/C to HEP    PT Home Exercise Plan compression socks, inversion, calf eccentrics with 2" step, calf stretch; 12/29:  RTB all directions, bent-knee soleus/tibialis posterior eccentrics    Consulted and Agree with Plan of Care Patient             Patient will benefit from skilled therapeutic intervention in order to improve the following deficits and impairments:  Abnormal gait, Decreased endurance, Increased edema, Decreased knowledge of use  of DME, Decreased activity tolerance, Decreased balance, Decreased mobility, Decreased range of motion, Difficulty walking, Decreased strength, Pain  Visit Diagnosis: Difficulty in walking, not elsewhere classified  Pain in left ankle and joints of left foot  Pain in right ankle and joints of right foot     Problem List Patient Active Problem List   Diagnosis Date Noted   Melena    Upper GI bleeding 04/05/2020   Heme positive stool 04/05/2020   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/03/2020   BMI 30.0-30.9,adult 10/16/2019   Rectal bleeding    Hypercalcemia 02/01/2018   BRBPR (bright red blood per rectum) 11/29/2017   Cognitive dysfunction 10/30/2017   Dysphagia, pharyngeal 05/24/2017   Mass of left upper extremity 05/14/2015   Hyperglycemia 10/12/2014   Ganglion cyst 09/26/2013   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

## 2021-09-23 ENCOUNTER — Other Ambulatory Visit: Payer: Self-pay

## 2021-09-23 ENCOUNTER — Ambulatory Visit (HOSPITAL_COMMUNITY): Payer: Medicare Other

## 2021-09-23 DIAGNOSIS — R262 Difficulty in walking, not elsewhere classified: Secondary | ICD-10-CM | POA: Diagnosis not present

## 2021-09-23 DIAGNOSIS — M25572 Pain in left ankle and joints of left foot: Secondary | ICD-10-CM | POA: Diagnosis not present

## 2021-09-23 DIAGNOSIS — M25571 Pain in right ankle and joints of right foot: Secondary | ICD-10-CM | POA: Diagnosis not present

## 2021-09-23 NOTE — Therapy (Signed)
[facility] [address] [place], [place], [address] Phone: [phone]   Fax:  [phone]  Physical Therapy Treatment and D/C Summary  Patient Details  Name: Melissa Barajas MRN: [id number] Date of Birth: [birth year] Referring Provider (PT): Criselda Peaches, DPM  PHYSICAL THERAPY DISCHARGE SUMMARY  Visits from Start of Care: 9  Current functional level related to goals / functional outcomes: Able to meet majority of STG/LTG and able to abolish foot/ankle pain   Remaining deficits: Flat foot deformity. Pt has f/u with Podiatrist to discuss further options such as orthotics or other    Education / Equipment: HEP   Patient agrees to discharge. Patient goals were met. Patient is being discharged due to meeting the stated rehab goals.  Encounter Date: 09/23/2021   PT End of Session - 09/23/21 1021     Visit Number 9    Number of Visits 12    Date for PT Re-Evaluation 10/04/21    Authorization Type medicare  and secondary [employer]    PT Start Time 1025    PT Stop Time 1055   D/C visit   PT Time Calculation (min) 30 min    Activity Tolerance Patient tolerated treatment well    Behavior During Therapy WFL for tasks assessed/performed             Past Medical History:  Diagnosis Date   Arthritis    Atrial fibrillation ([place])    Collagen vascular disease ([place])    DIVERTICULOSIS OF COLON 08/25/2010   Qualifier: Diagnosis of  By: Hoy Morn     GERD (gastroesophageal reflux disease)    HEMORRHOIDS, INTERNAL 08/25/2010   Qualifier: Diagnosis of  By: Hoy Morn     Hypercholesteremia    Hypertension    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/03/2020   EGD and EGD [facility] 01/2020   Lipoma of arm    Left    Past Surgical History:  Procedure Laterality Date   ABDOMINAL HYSTERECTOMY     Partial   BIOPSY  10/24/2019   Procedure: BIOPSY;  Surgeon: Danie Binder, MD;  [facility];  Service:  Endoscopy;;   COLONOSCOPY  2009   SLF: 1. screening colonoscopy in 10 years 2. She should follow a high fiber diet She has been given a hand out on high fiber diverticulosois and hemorrhoids   COLONOSCOPY N/A 02/07/2018   Procedure: COLONOSCOPY;  Surgeon: Danie Binder, MD;  [facility];  Service: Endoscopy;  Laterality: N/A;  1:00pm   CYSTECTOMY     Left breast   ESOPHAGOGASTRODUODENOSCOPY     OZD:[id number] appearing Schatzki's ring, possible cervical esophageal web, both dilated with passage of a Maloney dilator/small hiatal hernia   ESOPHAGOGASTRODUODENOSCOPY N/A 10/25/2015   Procedure: ESOPHAGOGASTRODUODENOSCOPY (EGD);  Surgeon: Danie Binder, MD;  [facility];  Service: Endoscopy;  Laterality: N/A;  1215pm   ESOPHAGOGASTRODUODENOSCOPY N/A 10/24/2019   Schatzki's ring s/p dilation and single medium firm, probable submucosal nodule without bleeding. Medium sized hiatal hernia, normal duodenum.    ESOPHAGOGASTRODUODENOSCOPY N/A 04/05/2020   10 mm leiomyoma and capsule study without obvious lesion or bleeding.   EUS  11/2019   Dr. Paulita Fujita: EUS completed March 2021 with likely leiomyoma, no other concerns. 10 mm by 10 mm.    GIVENS CAPSULE STUDY N/A 04/06/2020   Procedure: GIVENS CAPSULE STUDY;  Surgeon: Rogene Houston, MD;  [facility];  Service: Endoscopy;  Laterality:  N/A;   POLYPECTOMY  02/07/2018   Procedure: POLYPECTOMY;  Surgeon: Danie Binder, MD;  [facility];  Service: Endoscopy;;  ascending colon, descending, sigmoid   SAVORY DILATION N/A 10/25/2015   Procedure: SAVORY DILATION;  Surgeon: Danie Binder, MD;  [facility];  Service: Endoscopy;  Laterality: N/A;   SAVORY DILATION N/A 10/24/2019   Procedure: SAVORY DILATION;  Surgeon: Danie Binder, MD;  [facility];  Service: Endoscopy;  Laterality: N/A;   THYROIDECTOMY     Tumor   05/2010   Left breast under nipple   VESICOVAGINAL FISTULA CLOSURE W/ TAH      fibroid tumors    There were no vitals filed for this visit.   Subjective Assessment - 09/23/21 1029     Subjective No instances of swelling and pain have reappeared    Patient Stated Goals to have less pain    Currently in Pain? No/denies    Pain Score 0-No pain    Pain Location Ankle    Pain Orientation Right;Left                [facility] PT Assessment - 09/23/21 0001       Assessment   Medical Diagnosis post tib dysfunction    Referring Provider (PT) Criselda Peaches, DPM      Strength   Right Ankle Dorsiflexion 5/5    Right Ankle Plantar Flexion 5/5    Right Ankle Inversion 4+/5    Right Ankle Eversion 5/5    Left Ankle Dorsiflexion 5/5    Left Ankle Plantar Flexion 5/5    Left Ankle Inversion 4+/5    Left Ankle Eversion 5/5                           [facility] Adult PT Treatment/Exercise - 09/23/21 0001       Ankle Exercises: Standing   SLS RLE 10 sec, 8 sec    Heel Raises Both;Other (comment)   1x15 knees extended, 1x15 knees flexed     Ankle Exercises: Stretches   Plantar Fascia Stretch 2 reps;60 seconds    Gastroc Stretch 2 reps;60 seconds    Slant Board Stretch 2 reps;60 seconds                       PT Short Term Goals - 09/23/21 1036       PT SHORT TERM GOAL #1   Title Patient will be independent in self management strategies to improve quality of life and functional outcomes.    Baseline met with updated and refined HEP edition provided    Time 3    Period Weeks    Status Achieved    Target Date 08/29/21      PT SHORT TERM GOAL #2   Title Patient will report at least 25% improvement in overall symptoms and/or function to demonstrate improved functional mobility    Baseline 10% improvement    Time 3    Period Weeks    Status Not Met    Target Date 08/29/21      PT SHORT TERM GOAL #3   Title Patient will be able to ambulate at least 5 minutes without increase in foot/ankle pain    Baseline able to complete  shopping trips 30-45 minutes without increased foot/ankle pain    Time 3    Period Weeks    Status Achieved  Target Date 08/29/21               PT Long Term Goals - 09/23/21 1037       PT LONG TERM GOAL #1   Title Patient will report at least 50% improvement in overall symptoms and/or function to demonstrate improved functional mobility    Baseline 10% improvement    Time 6    Period Weeks    Status Not Met      PT LONG TERM GOAL #2   Title Patient will meet predicted FOTO score to demonstrate improved overall function.    Baseline 66% function    Time 6    Period Weeks    Status Achieved      PT LONG TERM GOAL #3   Title Patietn will be able to demonstrate 10 heel raises (double leg) without compensatory anterior weight shift to demonstrate improved LE strength.    Baseline 5/5 ankle strength, 4+/5 inversion    Time 6    Period Weeks    Status Achieved                   Plan - 09/23/21 1050     Clinical Impression Statement Pt has progressed very well with POC details and demonstrating global improvements in ankle strength and has ceased to have foot/ankle pain and reports greater level of activity tolerance and resumed her typical activities.  Encouraged to resume a walking/exercise program for general benefit. Mt majority of STG/LTG with significant gain in [place] survey assessment.  HEP has been condensed and refined to stress use of plantarflexion eccentrics and prolonged static stretching loads to triceps surae complex. D/C to HEP    Personal Factors and Comorbidities Comorbidity 1;Comorbidity 2    Comorbidities B TKA    Examination-Activity Limitations Squat;Stairs;Stand;Transfers;Locomotion Level    Examination-Participation Restrictions Laundry;Meal Prep;Community Activity;Shop;Cleaning    Stability/Clinical Decision Making Stable/Uncomplicated    Rehab Potential Fair    PT Frequency 2x / week    PT Duration 6 weeks    PT Treatment/Interventions  ADLs/Self Care Home Management;Iontophoresis 4m/ml Dexamethasone;Moist Heat;Traction;Therapeutic exercise;Manual techniques;Therapeutic activities;Cryotherapy;DME Instruction;Gait training;Stair training;Dry needling;Joint Manipulations;Neuromuscular re-education;Patient/family education    PT Next Visit Plan re-assess, D/C to HEP    PT Home Exercise Plan compression socks, inversion, calf eccentrics with 2" step, calf stretch; 12/29:  RTB all directions, bent-knee soleus/tibialis posterior eccentrics    Consulted and Agree with Plan of Care Patient             Patient will benefit from skilled therapeutic intervention in order to improve the following deficits and impairments:  Abnormal gait, Decreased endurance, Increased edema, Decreased knowledge of use of DME, Decreased activity tolerance, Decreased balance, Decreased mobility, Decreased range of motion, Difficulty walking, Decreased strength, Pain  Visit Diagnosis: Difficulty in walking, not elsewhere classified  Pain in left ankle and joints of left foot  Pain in right ankle and joints of right foot     Problem List Patient Active Problem List   Diagnosis Date Noted   Melena    Upper GI bleeding 04/05/2020   Heme positive stool 04/05/2020   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/03/2020   BMI 30.0-30.9,adult 10/16/2019   Rectal bleeding    Hypercalcemia 02/01/2018   BRBPR (bright red blood per rectum) 11/29/2017   Cognitive dysfunction 10/30/2017   Dysphagia, pharyngeal 05/24/2017   Mass of left upper extremity 05/14/2015   Hyperglycemia 10/12/2014   Ganglion cyst 09/26/2013   Prolapse of vaginal vault after hysterectomy  03/25/2013   Urge incontinence 03/25/2013   Functional constipation 11/20/2012   Esophageal dysphagia 08/30/2010   Hyperlipidemia 08/25/2010   Essential hypertension 08/25/2010   GERD 08/25/2010    Toniann Fail, PT 09/23/2021, 10:53 AM  [facility] [address] [place], [place], [address] Phone: [phone]   Fax:  [phone]  Name: Melissa Barajas MRN: [id number] Date of Birth: August 29, 1941

## 2021-09-26 ENCOUNTER — Ambulatory Visit: Payer: Medicare Other | Admitting: Podiatry

## 2021-09-26 ENCOUNTER — Other Ambulatory Visit: Payer: Self-pay

## 2021-09-26 DIAGNOSIS — M2141 Flat foot [pes planus] (acquired), right foot: Secondary | ICD-10-CM

## 2021-09-26 DIAGNOSIS — M76822 Posterior tibial tendinitis, left leg: Secondary | ICD-10-CM | POA: Diagnosis not present

## 2021-09-26 DIAGNOSIS — M76821 Posterior tibial tendinitis, right leg: Secondary | ICD-10-CM

## 2021-09-26 DIAGNOSIS — M2142 Flat foot [pes planus] (acquired), left foot: Secondary | ICD-10-CM | POA: Diagnosis not present

## 2021-09-27 ENCOUNTER — Telehealth: Payer: Self-pay | Admitting: Family Medicine

## 2021-09-27 MED ORDER — POTASSIUM CHLORIDE CRYS ER 10 MEQ PO TBCR: 10.0000 meq | EXTENDED_RELEASE_TABLET | Freq: Every day | ORAL | 0 refills | Status: DC

## 2021-09-27 MED ORDER — FAMOTIDINE 40 MG PO TABS: 40.0000 mg | ORAL_TABLET | Freq: Every day | ORAL | 0 refills | Status: DC

## 2021-09-27 NOTE — Telephone Encounter (Signed)
Patient is requesting refill on famotidine 40 mg and potaSSIUM 10 MG CALLED IN [facility].

## 2021-09-27 NOTE — Telephone Encounter (Signed)
Prescriptions sent electronically to pharmacy. Patient notified. °

## 2021-09-28 NOTE — Progress Notes (Signed)
°  Subjective:  Patient ID: Melissa Barajas, female    DOB: 1941/09/11,  MRN: [id number]  Chief Complaint  Patient presents with   Tendonitis    1 month follow up PTTD bilateral    81 y.o. female returns for follow-up with the above complaint. History confirmed with patient.  She was doing very well at the end of December was having no pain or discomfort.  Finished physical therapy last week.  She spent quite a bit of time on her feet at [facility] and this made it worse.  Mostly in the left side.    Objective:  Physical Exam: warm, good capillary refill, no trophic changes or ulcerative lesions, normal DP and PT pulses, normal sensory exam, and today she has Mild pain on the PT tendon and insertion bilateral  Assessment:   1. Posterior tibial tendon dysfunction (PTTD) of both lower extremities   2. Pes planus of both feet      Plan:  Patient was evaluated and treated and all questions answered.  She did very well with physical therapy and bracing.  It was nearly pain-free and has had some recurrence.  She likely needs a long-term support.  We will have her to see our orthotist Aaron Edelman Little to be evaluated for the possibility of either a [place] mezzo brace versus a custom molded longitudinal foot orthosis.  I showed her examples of both of these today and discussed them with her.  I discussed with her the foot orthosis would likely give her more flexibility with different shoes and is less bulky but would not be covered by her insurance and would have to be out-of-pocket for this whereas the Mezzo brace likely would be covered by Medicare but is going to be further restrictive as far as the shoe gear that she can wear   No follow-ups on file.

## 2021-10-03 ENCOUNTER — Ambulatory Visit: Payer: Medicare Other

## 2021-10-03 ENCOUNTER — Other Ambulatory Visit: Payer: Self-pay

## 2021-10-03 DIAGNOSIS — M76821 Posterior tibial tendinitis, right leg: Secondary | ICD-10-CM

## 2021-10-03 DIAGNOSIS — M76822 Posterior tibial tendinitis, left leg: Secondary | ICD-10-CM

## 2021-10-03 NOTE — Progress Notes (Signed)
SITUATION Patient Name:  Melissa Barajas MRN:   [id number] Reason for Visit: Evaluation for Speciality AFO  Patient Report: Chief Complaint:   Patient reports feet hurt Melissa Barajas of Discomfort/Pain:  Ambulatory Standing Location:    bilateral lower extremity Onset & Duration:   Gradual and Present longer than 3 months Course:    gradually worsening Aggravating or Alleviating Factors: Ambulation  OBJECTIVE DATA & MEASUREMENTS Prognosis:    Good Duration of use:   5 years  Diagnosis:   ICD-10-CM   1. Posterior tibial tendon dysfunction (PTTD) of both lower extremities  T73.220    U54.270        GOALS, NECESSITIES, & JUSTIFICATIONS Recommended Device: [place] Mezzo Color:    Black Closure:   Velcro  Laterality HCPCS Code Description Justification  bilateral [id number] Plastic supramalleolar orthosis, custom molded from a model of the patient, custom fabricated, includes casting and cast preparation. Necessary to provide triplanar support to the foot/ankle complex  bilateral L2330 Addition to lower extremity, lacer molded to patient model Necessary to ensure secure hold of orthosis to patient's limb  bilateral L2820 Addition to lower extremity orthosis, soft interface for molded plastic below knee section Necessary to relieve pressure on bony prominences    I certify that Melissa Barajas qualifies for and will benefit from an ankle foot orthosis used during ambulation based on meeting all of the following criteria;   The patient is: - Ambulatory, and - Has weakness or deformity of the foot and ankle, and - Requires stabilization for medical reasons, and - Has the potential to benefit functionally  The patients medical record contains sufficient documentation of the patients medical condition to substantiate the necessity for the type and quantity of the items ordered.  The goals of this therapy: - Improve Mobility - Improve Lower Extremity Stability - Decrease Pain - Facilitate Soft  Tissue Healing - Facilitate Immobilization, healing and treatment of an injury  Necessity of Ankle Foot Orthotic molded to patient model: A custom (vs. prefabricated) ankle foot orthosis has been prescribed based on the following criteria which are specific to the condition of this patient; - The patient could not be fit with a prefabricated AFO - The condition necessitating the orthosis is expected to be permanent or of longstanding duration (more than 6 months) - There is need to control the ankle or foot in more than one plane - The patient has a documented neurological, circulatory, or orthopedic condition that requires custom fabrication over a model to prevent tissue injury - The patient has a healing fracture that lacks normal anatomical integrity or anthropometric proportions  I hereby certify that the ankle foot orthotic described above is a rigid or semi-rigid device which is used for the purpose of supporting a weak or deformed body member or restricting or eliminating motion in a diseased or injured part of the body. It is designed to provide support and counterforce on the limb or body part that is being braced. In my opinion, the custom molded ankle foot orthosis is both reasonable and necessary in reference to accepted standards of medical practice in the treatment  of the patient condition and rehabilitation.  ACTIONS PERFORMED Patient was evaluated and casted for Specialty AFO via [employer] Cast[employer]ock. Procedure was explained to patient. Patient tolerated procedure. patient selected device color and closure method.   PLAN Patient to return in four to six weeks for fitting and delivery of device. Plan of care was explained to and agreed upon by patient.  All questions were answered and concerns addressed.

## 2021-10-06 ENCOUNTER — Other Ambulatory Visit: Payer: Self-pay | Admitting: *Deleted

## 2021-10-06 MED ORDER — FAMOTIDINE 40 MG PO TABS: 40.0000 mg | ORAL_TABLET | Freq: Every day | ORAL | 0 refills | Status: DC

## 2021-10-10 ENCOUNTER — Telehealth: Payer: Self-pay | Admitting: Podiatry

## 2021-10-10 NOTE — Telephone Encounter (Signed)
Per Joules F @ [employer] medic[employer] pt effective 1.1.2023 and we are in network. Brace codes P8984,K1031,Y8118 is covered if follows medicare guidelines. No Josem Kaufmann is needed if under 1000.00 per line item. Covered @ 80% no deductible. Out of pocket is 3200.00(met 20.00) reference number Jou[employer] F 1.30.2023.

## 2021-10-11 DIAGNOSIS — R1013 Epigastric pain: Secondary | ICD-10-CM | POA: Diagnosis not present

## 2021-10-11 DIAGNOSIS — E7849 Other hyperlipidemia: Secondary | ICD-10-CM | POA: Diagnosis not present

## 2021-10-11 DIAGNOSIS — D214 Benign neoplasm of connective and other soft tissue of abdomen: Secondary | ICD-10-CM | POA: Diagnosis not present

## 2021-10-11 DIAGNOSIS — I10 Essential (primary) hypertension: Secondary | ICD-10-CM | POA: Diagnosis not present

## 2021-10-12 LAB — BASIC METABOLIC PANEL
BUN/Creatinine Ratio: 11 — ABNORMAL LOW (ref 12–28)
BUN: 8 mg/dL (ref 8–27)
CO2: 24 mmol/L (ref 20–29)
Calcium: 10.1 mg/dL (ref 8.7–10.3)
Chloride: 101 mmol/L (ref 96–106)
Creatinine, Ser: 0.76 mg/dL (ref 0.57–1.00)
Glucose: 97 mg/dL (ref 70–99)
Potassium: 4.1 mmol/L (ref 3.5–5.2)
Sodium: 140 mmol/L (ref 134–144)
eGFR: 80 mL/min/{1.73_m2} (ref >59)

## 2021-10-12 LAB — HEPATIC FUNCTION PANEL
ALT: 16 IU/L (ref 0–32)
AST: 21 IU/L (ref 0–40)
Albumin: 4.6 g/dL (ref 3.7–4.7)
Alkaline Phosphatase: 110 IU/L (ref 44–121)
Bilirubin Total: 0.5 mg/dL (ref 0.0–1.2)
Bilirubin, Direct: 0.16 mg/dL (ref 0.00–0.40)
Total Protein: 7.2 g/dL (ref 6.0–8.5)

## 2021-10-12 LAB — LIPID PANEL
Chol/HDL Ratio: 3.6 ratio (ref 0.0–4.4)
Cholesterol, Total: 180 mg/dL (ref 100–199)
HDL: 50 mg/dL (ref >39)
LDL Chol Calc (NIH): 112 mg/dL — ABNORMAL HIGH (ref 0–99)
Triglycerides: 97 mg/dL (ref 0–149)
VLDL Cholesterol Cal: 18 mg/dL (ref 5–40)

## 2021-11-04 ENCOUNTER — Ambulatory Visit (INDEPENDENT_AMBULATORY_CARE_PROVIDER_SITE_OTHER): Payer: Medicare Other | Admitting: Family Medicine

## 2021-11-04 ENCOUNTER — Encounter: Payer: Self-pay | Admitting: Family Medicine

## 2021-11-04 ENCOUNTER — Other Ambulatory Visit: Payer: Self-pay

## 2021-11-04 ENCOUNTER — Telehealth: Payer: Self-pay | Admitting: Family Medicine

## 2021-11-04 VITALS — BP 146/78 | Temp 97.2°F | Wt 177.6 lb

## 2021-11-04 DIAGNOSIS — I10 Essential (primary) hypertension: Secondary | ICD-10-CM

## 2021-11-04 DIAGNOSIS — E7849 Other hyperlipidemia: Secondary | ICD-10-CM | POA: Diagnosis not present

## 2021-11-04 MED ORDER — FAMOTIDINE 40 MG PO TABS
40.0000 mg | ORAL_TABLET | Freq: Every day | ORAL | 3 refills | Status: DC
Start: 2021-11-04 — End: 2023-02-12

## 2021-11-04 MED ORDER — POTASSIUM CHLORIDE CRYS ER 10 MEQ PO TBCR: 10.0000 meq | EXTENDED_RELEASE_TABLET | Freq: Every day | ORAL | 3 refills | Status: DC

## 2021-11-04 MED ORDER — ROSUVASTATIN CALCIUM 20 MG PO TABS: ORAL_TABLET | ORAL | 4 refills | Status: DC

## 2021-11-04 MED ORDER — DILTIAZEM HCL ER COATED BEADS 360 MG PO CP24: 360.0000 mg | ORAL_CAPSULE | Freq: Every day | ORAL | 3 refills | Status: DC

## 2021-11-04 MED ORDER — FLUTICASONE FUROATE-VILANTEROL 200-25 MCG/ACT IN AEPB: INHALATION_SPRAY | RESPIRATORY_TRACT | 4 refills | Status: DC

## 2021-11-04 MED ORDER — CLONIDINE HCL 0.1 MG PO TABS: 0.1000 mg | ORAL_TABLET | Freq: Two times a day (BID) | ORAL | 3 refills | Status: DC

## 2021-11-04 NOTE — Telephone Encounter (Signed)
Patient was seen this morning and she is taking famotidine 40 mg and she will stop taking pantroprazole 40 mg. She wanted to know was that correct you wanted her to stop that medication. Please advise

## 2021-11-04 NOTE — Progress Notes (Signed)
° °  Subjective:    Patient ID: Melissa Barajas, female    DOB: 1941-03-04, 81 y.o.   MRN: [id number]  HPI Pt following up on HTN.  Taking blood pressure medicine on a regular basis Denies any major setbacks No swelling  Pt states she wakes up in the mornings with headaches and after studying her vision becomes foggy.  She states this occurs if she does too much reading she does get her eyes checked up on a regular basis   Pt reports soreness on bridge of nose that began earlier this morning.  Denies any sinus pressure or pain no wheezing or fevers  Pt taking all meds as directed. Unable to take BP at home due to not being able to get machine to work right.   Review of Systems     Objective:   Physical Exam General-in no acute distress Eyes-no discharge Lungs-respiratory rate normal, CTA CV-no murmurs,RRR Extremities skin warm dry no edema Neuro grossly normal Behavior normal, alert  Results for orders placed or performed in visit on 07/05/21  Lipid Profile  Result Value Ref Range   Cholesterol, Total 180 100 - 199 mg/dL   Triglycerides 97 0 - 149 mg/dL   HDL 50 >39 mg/dL   VLDL Cholesterol Cal 18 5 - 40 mg/dL   LDL Chol Calc (NIH) 112 (H) 0 - 99 mg/dL   Chol/HDL Ratio 3.6 0.0 - 4.4 ratio  Hepatic function panel  Result Value Ref Range   Total Protein 7.2 6.0 - 8.5 g/dL   Albumin 4.6 3.7 - 4.7 g/dL   Bilirubin Total 0.5 0.0 - 1.2 mg/dL   Bilirubin, Direct 0.16 0.00 - 0.40 mg/dL   Alkaline Phosphatase 110 44 - 121 IU/L   AST 21 0 - 40 IU/L   ALT 16 0 - 32 IU/L  Basic Metabolic Panel (BMET)  Result Value Ref Range   Glucose 97 70 - 99 mg/dL   BUN 8 8 - 27 mg/dL   Creatinine, Ser 0.76 0.57 - 1.00 mg/dL   eGFR 80 >59 mL/min/1.73   BUN/Creatinine Ratio 11 (L) 12 - 28   Sodium 140 134 - 144 mmol/L   Potassium 4.1 3.5 - 5.2 mmol/L   Chloride 101 96 - 106 mmol/L   CO2 24 20 - 29 mmol/L   Calcium 10.1 8.7 - 10.3 mg/dL         Assessment & Plan:   Currently patient  thinks she was taking PPI and H2 blocker but once she got home she was only on H2 blocker she is to continue this stay away from the PPI  Blood pressure mildly elevated she states she will watch her diet closely exercise and follow-up in several weeks if it still elevated consider switching away from diltiazem and over to medication such as amlodipine  Hyperlipidemia lipid profile slightly elevated but patient can only tolerate statins 2 days a week continue these current measures  Patient does have bilateral foot pain seen podiatry currently.

## 2021-11-04 NOTE — Telephone Encounter (Signed)
So noted, message to her pharmacy was sent to continue famotidine and stop all pantoprazole

## 2021-11-14 ENCOUNTER — Ambulatory Visit (INDEPENDENT_AMBULATORY_CARE_PROVIDER_SITE_OTHER): Payer: Medicare Other

## 2021-11-14 ENCOUNTER — Other Ambulatory Visit: Payer: Self-pay

## 2021-11-14 DIAGNOSIS — M76821 Posterior tibial tendinitis, right leg: Secondary | ICD-10-CM | POA: Diagnosis not present

## 2021-11-14 DIAGNOSIS — M76822 Posterior tibial tendinitis, left leg: Secondary | ICD-10-CM | POA: Diagnosis not present

## 2021-11-14 DIAGNOSIS — M778 Other enthesopathies, not elsewhere classified: Secondary | ICD-10-CM

## 2021-11-14 DIAGNOSIS — M722 Plantar fascial fibromatosis: Secondary | ICD-10-CM

## 2021-11-14 DIAGNOSIS — M2142 Flat foot [pes planus] (acquired), left foot: Secondary | ICD-10-CM

## 2021-11-14 DIAGNOSIS — M2141 Flat foot [pes planus] (acquired), right foot: Secondary | ICD-10-CM

## 2021-11-14 NOTE — Progress Notes (Signed)
SITUATION ?Reason for Visit: Dispensation and Fitting of Custom Molded Gauntlet ?Patient Report: Patient reports comfort in ambulation and understands all instructions. ? ?OBJECTIVE DATA ?Patient History / Diagnosis:   ?  ICD-10-CM   ?1. Posterior tibial tendon dysfunction (PTTD) of both lower extremities  M76.821   ? T90.300   ?  ?2. Pes planus of both feet  M21.41   ? M21.42   ?  ?3. Plantar fasciitis of right foot  M72.2   ?  ?4. Capsulitis of right foot  M77.8   ?  ? ? ?Provided Device:  Custom Molded Gauntlet: [place] bilateral ? ?Goals of Orthosis: ?- Improve gait ?- Decrease energy expenditure during the gait cycle ?- Improve balance ?- Stabilize motion at ankle and subtalar joint ?- Compensate for muscle weakness ?- Facilitate motion ?- Provide triplanar ankle and foot stabilization for weight bearing activities ? ?Device Justification: ?- Patient is ambulatory  ?- Device is medically necessary as part of the overall treatment due to the patient's condition and related symptoms ?- It is anticipated that the patient will benefit functionally with use of the device.  ?- The custom device is utilized in an attempt to avoid the need for surgery and because a prefabricated device is inappropriate. ? ?Upon gait analysis, the device appeared to be fitting well and the patient states that the device is comfortable. ? ?ACTIONS PERFORMED ?Patient was fit with Bilateral Custom Molded Gauntlet. Patient tolerated fitting procedure. Fit of the device is good. Patient was able to apply properly and ambulate without distress. Device function is to restrict and limit motion and provide stabilization in the ankle joint.  ? ?Goals and function of this device were explained in detail to the patient. The patient was shown how to properly apply, wear, and care for the device. It was explained that the device will fit and function best in an adjustable-closure shoe with a firm heel counter and a wide base of support.  When the device was dispensed, it was suitable for the patient's condition and not substandard. No guarantees were given. Precautions were reviewed.  ? ?Written instructions, warranty information, and a copy of DMEPOS Supplier Standards were provided. All questions answered and concerns addressed. ? ?PLAN ?Patient is to follow up in one week or as necessary (PRN). Plan of care was discussed with and agreed upon by patient. ?  ?

## 2021-11-28 ENCOUNTER — Other Ambulatory Visit: Payer: Self-pay

## 2021-11-28 ENCOUNTER — Ambulatory Visit (INDEPENDENT_AMBULATORY_CARE_PROVIDER_SITE_OTHER): Payer: Medicare Other | Admitting: Family Medicine

## 2021-11-28 VITALS — BP 170/76 | HR 54 | Temp 97.9°F | Ht 66.0 in | Wt 178.0 lb

## 2021-11-28 DIAGNOSIS — I10 Essential (primary) hypertension: Secondary | ICD-10-CM

## 2021-11-28 MED ORDER — DICLOFENAC SODIUM 75 MG PO TBEC: DELAYED_RELEASE_TABLET | ORAL | 0 refills | Status: DC

## 2021-11-28 MED ORDER — AMLODIPINE BESYLATE 5 MG PO TABS: 5.0000 mg | ORAL_TABLET | Freq: Every day | ORAL | 4 refills | Status: DC

## 2021-11-28 NOTE — Progress Notes (Signed)
? ?  Subjective:  ? ? Patient ID: Melissa Barajas, female    DOB: September 28, 1940, 81 y.o.   MRN: [id number] ? ?Hypertension ?This is a chronic problem. The problem is controlled. Treatments tried: clonidine, diltiazem.  ? ?Arthritis pain in both ankles , pain and swelling  request for refill diclofenac for prn use  ? ?Review of Systems ? ?   ?Objective:  ? Physical Exam ? ?General-in no acute distress ?Eyes-no discharge ?Lungs-respiratory rate normal, CTA ?CV-no murmurs,RRR ?Extremities skin warm dry no edema ?Neuro grossly normal ?Behavior normal, alert ? ? ? ?   ?Assessment & Plan:  ?Arthritis in the ankles May use diclofenac sparingly.  Has tolerated this before in the past.  If any burning in the abdomen vomiting or black tarry stools stop medicine immediately ?Patient states she uses it very sparingly ? ?Elevated blood pressure minimize salt in the diet stay active continue clonidine twice daily ?Stop diltiazem ?Start amlodipine 5 mg daily ?Recent kidney functions look good ?Recheck patient in a couple weeks if not where she needs to be may need to bump up dose of amlodipine to 10 mg. ?We will keep in mind that that could cause swelling in the ankles. ? ?In the past when patient went on into the mind and chlorthalidone she had elevation of renal functions as well as low potassium with the chlorthalidone. ? ?It should be noted that she did not take her blood pressure medicine today.  I have encouraged her on future visits to make sure she takes her medicine before coming ? ?We will recheck her again in approximately 2 weeks ? ?History of ? ? ? ? ? ? ? ? ? ? ? ? ? ? ? ? ? ? ? ? ? ? ? ? ? ? ? ? ? ? ? ? ? ? ? ? ? ? ? ? ? ? ? ? ? ? ? ? ? ? ? ? ? ? ? ? ? ? ? ? ? ? ? ? ? ? ? ? ? ? ? ? ? ? ? ? ? ? ? ? ? ? ? ? ? ? ? ? ? ? ? ? ? ? ? ? ? ? ? ? ? ? ? ? ? ? ? ? ? ? ? ? ? ? ? ? ? ? ? ? ? ? ? ? ? ?

## 2021-11-28 NOTE — Patient Instructions (Addendum)
Stop Diltiazem ?Start amlodipine ?Recheck here April6 at 10:20 ? ?May use the diclofenac on a as needed basis ? ?Minimize salt in diet ?Try to fit in some walking ?

## 2021-12-15 ENCOUNTER — Ambulatory Visit (INDEPENDENT_AMBULATORY_CARE_PROVIDER_SITE_OTHER): Payer: Medicare Other | Admitting: Family Medicine

## 2021-12-15 VITALS — BP 128/84 | Ht 66.0 in | Wt 180.2 lb

## 2021-12-15 DIAGNOSIS — I10 Essential (primary) hypertension: Secondary | ICD-10-CM

## 2021-12-15 DIAGNOSIS — M1990 Unspecified osteoarthritis, unspecified site: Secondary | ICD-10-CM | POA: Diagnosis not present

## 2021-12-15 NOTE — Progress Notes (Signed)
? ?  Subjective:  ? ? Patient ID: Melissa Barajas, female    DOB: 21-Nov-1940, 81 y.o.   MRN: [id number] ? ?Hypertension ?This is a chronic problem. The current episode started more than 1 year ago. Risk factors for coronary artery disease include post-menopausal state and dyslipidemia. Treatments tried: norvasc, catapres. There are no compliance problems.   ? ?Patient states her fingers in both hands seen to be turning and hurt in cold water ? ?Review of Systems ? ?   ?Objective:  ? Physical Exam ? ?Lungs clear hearts regular pulse normal extremities no edema ?On her fingers is apparent she is starting to get some lateral drift consistent with osteoarthritis she denies any hand pain discomfort stiffness ? ? ?   ?Assessment & Plan:  ? ?Patient with changes of osteoarthritis no need to do additional testing currently she is not having any pain ? ?Blood pressure doing very well on current medicine continue current medication follow-up by summertime ?

## 2021-12-15 NOTE — Patient Instructions (Signed)
Hi Melissa Barajas ?It was good to see you today.  Continue your current medications.  Please get in with your gastroenterologist as planned.  If you need our help let us know.  We will see you back in 4 to 5 months TakeCare ?

## 2022-01-17 ENCOUNTER — Ambulatory Visit (INDEPENDENT_AMBULATORY_CARE_PROVIDER_SITE_OTHER): Payer: Medicare Other

## 2022-01-17 VITALS — Ht 66.0 in | Wt 180.0 lb

## 2022-01-17 DIAGNOSIS — Z0000 Encounter for general adult medical examination without abnormal findings: Secondary | ICD-10-CM | POA: Diagnosis not present

## 2022-01-17 DIAGNOSIS — R109 Unspecified abdominal pain: Secondary | ICD-10-CM | POA: Insufficient documentation

## 2022-01-17 NOTE — Patient Instructions (Signed)
Ms. Summerhill , ?Thank you for taking time to come for your Medicare Wellness Visit. I appreciate your ongoing commitment to your health goals. Please review the following plan we discussed and let me know if I can assist you in the future.  ? ?Screening recommendations/referrals: ?Colonoscopy: No longer required. ?Mammogram: Done 09/06/2021 Repeat annually ? ?Bone Density: Done 10/28/2015 Repeat every 2 years ? ?Recommended yearly ophthalmology/optometry visit for glaucoma screening and checkup ?Recommended yearly dental visit for hygiene and checkup ? ?Vaccinations: ?Influenza vaccine: Due Fall 2023. ?Pneumococcal vaccine: done 07/01/2008 and 10/12/2014 ?Tdap vaccine: Due Repeat in 10 years ? ?Shingles vaccine: Done 07/01/2008   ?Covid-19:done 10/01/2019, 10/22/2019 and 06/10/2020 ? ?Advanced directives: Advance directive discussed with you today. Even though you declined this today, please call our office should you change your mind, and we can give you the proper paperwork for you to fill out. ? ? ?Conditions/risks identified: Aim for 30 minutes of exercise, 6-8 glasses of water, and 5 servings of fruits and vegetables each day. ? ? ?Next appointment: Follow up in one year for your annual wellness visit 2024 ? ? ?Preventive Care 53 Years and Older, Female ?Preventive care refers to lifestyle choices and visits with your health care provider that can promote health and wellness. ?What does preventive care include? ?A yearly physical exam. This is also called an annual well check. ?Dental exams once or twice a year. ?Routine eye exams. Ask your health care provider how often you should have your eyes checked. ?Personal lifestyle choices, including: ?Daily care of your teeth and gums. ?Regular physical activity. ?Eating a healthy diet. ?Avoiding tobacco and drug use. ?Limiting alcohol use. ?Practicing safe sex. ?Taking low-dose aspirin every day. ?Taking vitamin and mineral supplements as recommended by your health care  provider. ?What happens during an annual well check? ?The services and screenings done by your health care provider during your annual well check will depend on your age, overall health, lifestyle risk factors, and family history of disease. ?Counseling  ?Your health care provider may ask you questions about your: ?Alcohol use. ?Tobacco use. ?Drug use. ?Emotional well-being. ?Home and relationship well-being. ?Sexual activity. ?Eating habits. ?History of falls. ?Memory and ability to understand (cognition). ?Work and work Statistician. ?Reproductive health. ?Screening  ?You may have the following tests or measurements: ?Height, weight, and BMI. ?Blood pressure. ?Lipid and cholesterol levels. These may be checked every 5 years, or more frequently if you are over 79 years old. ?Skin check. ?Lung cancer screening. You may have this screening every year starting at age 58 if you have a 30-pack-year history of smoking and currently smoke or have quit within the past 15 years. ?Fecal occult blood test (FOBT) of the stool. You may have this test every year starting at age 56. ?Flexible sigmoidoscopy or colonoscopy. You may have a sigmoidoscopy every 5 years or a colonoscopy every 10 years starting at age 50. ?Hepatitis C blood test. ?Hepatitis B blood test. ?Sexually transmitted disease (STD) testing. ?Diabetes screening. This is done by checking your blood sugar (glucose) after you have not eaten for a while (fasting). You may have this done every 1-3 years. ?Bone density scan. This is done to screen for osteoporosis. You may have this done starting at age 76. ?Mammogram. This may be done every 1-2 years. Talk to your health care provider about how often you should have regular mammograms. ?Talk with your health care provider about your test results, treatment options, and if necessary, the need for more tests. ?  Vaccines  ?Your health care provider may recommend certain vaccines, such as: ?Influenza vaccine. This is  recommended every year. ?Tetanus, diphtheria, and acellular pertussis (Tdap, Td) vaccine. You may need a Td booster every 10 years. ?Zoster vaccine. You may need this after age 74. ?Pneumococcal 13-valent conjugate (PCV13) vaccine. One dose is recommended after age 25. ?Pneumococcal polysaccharide (PPSV23) vaccine. One dose is recommended after age 46. ?Talk to your health care provider about which screenings and vaccines you need and how often you need them. ?This information is not intended to replace advice given to you by your health care provider. Make sure you discuss any questions you have with your health care provider. ?Document Released: 09/24/2015 Document Revised: 05/17/2016 Document Reviewed: 06/29/2015 ?Elsevier Interactive Patient Education ? 2017 [place]. ? ?Fall Prevention in the Home ?Falls can cause injuries. They can happen to people of all ages. There are many things you can do to make your home safe and to help prevent falls. ?What can I do on the outside of my home? ?Regularly fix the edges of walkways and driveways and fix any cracks. ?Remove anything that might make you trip as you walk through a door, such as a raised step or threshold. ?Trim any bushes or trees on the path to your home. ?Use bright outdoor lighting. ?Clear any walking paths of anything that might make someone trip, such as rocks or tools. ?Regularly check to see if handrails are loose or broken. Make sure that both sides of any steps have handrails. ?Any raised decks and porches should have guardrails on the edges. ?Have any leaves, snow, or ice cleared regularly. ?Use sand or salt on walking paths during winter. ?Clean up any spills in your garage right away. This includes oil or grease spills. ?What can I do in the bathroom? ?Use night lights. ?Install grab bars by the toilet and in the tub and shower. Do not use towel bars as grab bars. ?Use non-skid mats or decals in the tub or shower. ?If you need to sit down in  the shower, use a plastic, non-slip stool. ?Keep the floor dry. Clean up any water that spills on the floor as soon as it happens. ?Remove soap buildup in the tub or shower regularly. ?Attach bath mats securely with double-sided non-slip rug tape. ?Do not have throw rugs and other things on the floor that can make you trip. ?What can I do in the bedroom? ?Use night lights. ?Make sure that you have a light by your bed that is easy to reach. ?Do not use any sheets or blankets that are too big for your bed. They should not hang down onto the floor. ?Have a firm chair that has side arms. You can use this for support while you get dressed. ?Do not have throw rugs and other things on the floor that can make you trip. ?What can I do in the kitchen? ?Clean up any spills right away. ?Avoid walking on wet floors. ?Keep items that you use a lot in easy-to-reach places. ?If you need to reach something above you, use a strong step stool that has a grab bar. ?Keep electrical cords out of the way. ?Do not use floor polish or wax that makes floors slippery. If you must use wax, use non-skid floor wax. ?Do not have throw rugs and other things on the floor that can make you trip. ?What can I do with my stairs? ?Do not leave any items on the stairs. ?Make sure that  there are handrails on both sides of the stairs and use them. Fix handrails that are broken or loose. Make sure that handrails are as long as the stairways. ?Check any carpeting to make sure that it is firmly attached to the stairs. Fix any carpet that is loose or worn. ?Avoid having throw rugs at the top or bottom of the stairs. If you do have throw rugs, attach them to the floor with carpet tape. ?Make sure that you have a light switch at the top of the stairs and the bottom of the stairs. If you do not have them, ask someone to add them for you. ?What else can I do to help prevent falls? ?Wear shoes that: ?Do not have high heels. ?Have rubber bottoms. ?Are comfortable  and fit you well. ?Are closed at the toe. Do not wear sandals. ?If you use a stepladder: ?Make sure that it is fully opened. Do not climb a closed stepladder. ?Make sure that both sides of the stepladder are locke

## 2022-01-17 NOTE — Progress Notes (Signed)
? ?Subjective:  ? Melissa Barajas is a 81 y.o. female who presents for Medicare Annual (Subsequent) preventive examination. ?Virtual Visit via Telephone Note ? ?I connected with  Melissa Barajas on 01/17/22 at  9:45 AM EDT by telephone and verified that I am speaking with the correct person using two identifiers. ? ?Location: ?Patient: HOME ?Provider: [facility] ?Persons participating in the virtual visit: patient/Nurse Health Advisor ?  ?I discussed the limitations, risks, security and privacy concerns of performing an evaluation and management service by telephone and the availability of in person appointments. The patient expressed understanding and agreed to proceed. ? ?Interactive audio and video telecommunications were attempted between this nurse and patient, however failed, due to patient having technical difficulties OR patient did not have access to video capability.  We continued and completed visit with audio only. ? ?Some vital signs may be absent or patient reported.  ? ?Chriss Driver, LPN ? ?Review of Systems    ? ?Cardiac Risk Factors include: advanced age (>35mn, >>58women);hypertension;dyslipidemia;sedentary lifestyle ? ?   ?Objective:  ?  ?Today's Vitals  ? 01/17/22 0951  ?Weight: 180 lb (81.6 kg)  ?Height: '5\' 6"'$  (1.676 m)  ? ?Body mass index is 29.05 kg/m?. ? ? ?  01/17/2022  ?  9:56 AM 08/08/2021  ? 10:51 AM 01/11/2021  ?  9:53 AM 04/05/2020  ?  4:50 PM 04/05/2020  ?  2:08 PM 04/05/2020  ?  9:28 AM 10/24/2019  ?  7:37 AM  ?Advanced Directives  ?Does Patient Have a Medical Advance Directive? No No No No No No No  ?Would patient like information on creating a medical advance directive? No - Patient declined No - Patient declined No - Patient declined No - Patient declined Yes (MAU/Ambulatory/Procedural Areas - Information given) Yes (ED - Information included in AVS) No - Patient declined  ? ? ?Current Medications (verified) ?Outpatient Encounter Medications as of 01/17/2022  ?Medication Sig  ? amLODipine  (NORVASC) 5 MG tablet Take 1 tablet (5 mg total) by mouth daily.  ? Ascorbic Acid (VITAMIN C) 1000 MG tablet Take 1,000 mg by mouth daily.  ? Cholecalciferol (VITAMIN D3) 50 MCG (2000 UT) TABS Take 2,000 Units by mouth daily.  ? cloNIDine (CATAPRES) 0.1 MG tablet Take 1 tablet (0.1 mg total) by mouth 2 (two) times daily.  ? Coenzyme Q10 (CO Q 10 PO) Take by mouth.  ? diclofenac (VOLTAREN) 75 MG EC tablet 1 twice daily as needed  ? famotidine (PEPCID) 40 MG tablet Take 1 tablet (40 mg total) by mouth daily.  ? Flaxseed, Linseed, (FLAX SEED OIL PO) Take by mouth.  ? fluticasone furoate-vilanterol (BREO ELLIPTA) 200-25 MCG/ACT AEPB USE 1 INHALATION BY MOUTH  DAILY  ? Phenazopyridine HCl (AZO TABS PO) Take by mouth.  ? Polyethyl Glycol-Propyl Glycol (SYSTANE OP) Place 2 drops into both eyes 2 (two) times daily.   ? potassium chloride (KLOR-CON M) 10 MEQ tablet Take 1 tablet (10 mEq total) by mouth daily.  ? rosuvastatin (CRESTOR) 20 MG tablet TAKE 1 TABLET BY MOUTH ON MONDAY AND 1 TABLET ON FRIDAY  ? Specialty Vitamins Products (BRAIN PO) Take 1 capsule by mouth daily. FOREBRAIN COGNITIVE PERFORMANCE SUPPLEMENT  ? ?No facility-administered encounter medications on file as of 01/17/2022.  ? ? ?Allergies (verified) ?Aricept [donepezil hcl], Hydrocodone, Lisinopril, Naprosyn [naproxen], and Vioxx [rofecoxib]  ? ?History: ?Past Medical History:  ?Diagnosis Date  ? Arthritis   ? Atrial fibrillation ([facility]   ? Collagen vascular  disease ([place])   ? DIVERTICULOSIS OF COLON 08/25/2010  ? Qualifier: Diagnosis of  By: Hoy Morn    ? GERD (gastroesophageal reflux disease)   ? HEMORRHOIDS, INTERNAL 08/25/2010  ? Qualifier: Diagnosis of  By: Hoy Morn    ? Hypercholesteremia   ? Hypertension   ? 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 EGD and EGD [facility] 01/2020  ? Lipoma of arm   ? Left  ? ?Past Surgical History:  ?Procedure Laterality Date  ? ABDOMINAL HYSTERECTOMY    ? Partial  ? BIOPSY  10/24/2019   ? Procedure: BIOPSY;  Surgeon: Danie Binder, MD;  [facility];  Service: Endoscopy;;  ? COLONOSCOPY  2009  ? SLF: 1. screening colonoscopy in 10 years 2. She should follow a high fiber diet She has been given a hand out on high fiber diverticulosois and hemorrhoids  ? COLONOSCOPY N/A 02/07/2018  ? Procedure: COLONOSCOPY;  Surgeon: Danie Binder, MD;  [facility];  Service: Endoscopy;  Laterality: N/A;  1:00pm  ? CYSTECTOMY    ? Left breast  ? ESOPHAGOGASTRODUODENOSCOPY    ? 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 ESOPHAGOGASTRODUODENOSCOPY N/A 10/25/2015  ? Procedure: ESOPHAGOGASTRODUODENOSCOPY (EGD);  Surgeon: Danie Binder, MD;  [facility];  Service: Endoscopy;  Laterality: N/A;  1215pm  ? ESOPHAGOGASTRODUODENOSCOPY N/A 10/24/2019  ? Schatzki's ring s/p dilation and single medium firm, probable submucosal nodule without bleeding. Medium sized hiatal hernia, normal duodenum.   ? ESOPHAGOGASTRODUODENOSCOPY N/A 04/05/2020  ? 10 mm leiomyoma and capsule study without obvious lesion or bleeding.  ? EUS  11/2019  ? Dr. Paulita Fujita: EUS completed March 2021 with likely leiomyoma, no other concerns. 10 mm by 10 mm.   ? GIVENS CAPSULE STUDY N/A 04/06/2020  ? Procedure: GIVENS CAPSULE STUDY;  Surgeon: Rogene Houston, MD;  [facility];  Service: Endoscopy;  Laterality: N/A;  ? POLYPECTOMY  02/07/2018  ? Procedure: POLYPECTOMY;  Surgeon: Danie Binder, MD;  [facility];  Service: Endoscopy;;  ascending colon, descending, sigmoid  ? SAVORY DILATION N/A 10/25/2015  ? Procedure: SAVORY DILATION;  Surgeon: Danie Binder, MD;  [facility];  Service: Endoscopy;  Laterality: N/A;  ? SAVORY DILATION N/A 10/24/2019  ? Procedure: SAVORY DILATION;  Surgeon: Danie Binder, MD;  [facility];  Service: Endoscopy;  Laterality: N/A;  ? THYROIDECTOMY     ? Tumor   05/2010  ? Left breast under nipple  ? VESICOVAGINAL FISTULA CLOSURE W/ TAH    ? fibroid tumors  ? ?Family History  ?Problem Relation Age of Onset  ? Cancer Brother   ?     Prostate  ? ?Social History  ? ?Socioeconomic History  ? Marital status: Widowed  ?  Spouse name: Not on file  ? Number of children: Not on file  ? Years of education: Not on file  ? Highest education level: Not on file  ?Occupational History  ? Not on file  ?Tobacco Use  ? Smoking status: Former  ?  Packs/day: 1.00  ?  Years: 15.00  ?  Pack years: 15.00  ?  Types: Cigarettes  ?  Start date: 09/12/1983  ?  Quit date: 09/11/1993  ?  Years since quitting: 28.3  ? Smokeless tobacco: Never  ?Vaping Use  ? Vaping Use: Never used  ?Substance  and Sexual Activity  ? Alcohol use: No  ?  Alcohol/week: 0.0 standard drinks  ? Drug use: No  ? Sexual activity: Not Currently  ?  Birth control/protection: Post-menopausal, Surgical  ?Other Topics Concern  ? Not on file  ?Social History Narrative  ? Not on file  ? ?Social Determinants of Health  ? ?Financial Resource Strain: Low Risk   ? Difficulty of Paying Living Expenses: Not hard at all  ?Food Insecurity: No Food Insecurity  ? Worried About Charity fundraiser in the Last Year: Never true  ? Ran Out of Food in the Last Year: Never true  ?Transportation Needs: No Transportation Needs  ? Lack of Transportation (Medical): No  ? Lack of Transportation (Non-Medical): No  ?Physical Activity: Sufficiently Active  ? Days of Exercise per Week: 5 days  ? Minutes of Exercise per Session: 30 min  ?Stress: No Stress Concern Present  ? Feeling of Stress : Not at all  ?Social Connections: Moderately Integrated  ? Frequency of Communication with Friends and Family: More than three times a week  ? Frequency of Social Gatherings with Friends and Family: More than three times a week  ? Attends Religious Services: More than 4 times per year  ? Active Member of Clubs or Organizations: Yes  ? Attends Archivist  Meetings: More than 4 times per year  ? Marital Status: Widowed  ? ? ?Tobacco Counseling ?Counseling given: Not Answered ? ? ?Clinical Intake: ? ?Pre-visit preparation completed: Yes ? ?Pain : No/denies pain ? ?

## 2022-01-20 ENCOUNTER — Other Ambulatory Visit: Payer: Self-pay | Admitting: Family Medicine

## 2022-02-03 ENCOUNTER — Telehealth: Payer: Self-pay

## 2022-02-03 NOTE — Telephone Encounter (Signed)
Pt called in stating that she needs a new prescription sent to [facility] for this med diltiazem (CARDIZEM CD) 360 MG 24 hr capsule. Please advise.  CB#: [phone]

## 2022-02-03 NOTE — Telephone Encounter (Signed)
Nurses-please review clinical notes regarding this to the patient. When we started the amlodipine we intended for her to stop the cardia sent If there is additional information regarding this let us know

## 2022-02-03 NOTE — Telephone Encounter (Signed)
Patient states she is not taking it - she just got letter from pharmacy saying to call for refills and didn't pay attention to it but she has refills on the amlodipine and is doing fine and doesn't need anything currently

## 2022-03-31 ENCOUNTER — Telehealth: Payer: Self-pay

## 2022-03-31 NOTE — Telephone Encounter (Signed)
Caller name:Mykelle [place] Crane   On DPR? :Yes  Call back number:[phone]  Provider they see: [place]   Reason for call:Pt dropped off DMV parking pass to be completed placed in Dr Nicki Reaper folder

## 2022-04-04 NOTE — Telephone Encounter (Signed)
completed

## 2022-04-18 ENCOUNTER — Encounter: Payer: Self-pay | Admitting: Family Medicine

## 2022-04-18 ENCOUNTER — Ambulatory Visit (INDEPENDENT_AMBULATORY_CARE_PROVIDER_SITE_OTHER): Payer: Medicare Other | Admitting: Family Medicine

## 2022-04-18 ENCOUNTER — Other Ambulatory Visit: Payer: Self-pay

## 2022-04-18 ENCOUNTER — Ambulatory Visit (HOSPITAL_COMMUNITY)
Admission: RE | Admit: 2022-04-18 | Discharge: 2022-04-18 | Disposition: A | Discharge status: Home / Self Care | Payer: Medicare Other | Source: Ambulatory Visit | Attending: Family Medicine | Admitting: Family Medicine

## 2022-04-18 VITALS — BP 150/74 | HR 71 | Temp 98.1°F | Ht 66.0 in | Wt 180.0 lb

## 2022-04-18 DIAGNOSIS — R252 Cramp and spasm: Secondary | ICD-10-CM | POA: Diagnosis not present

## 2022-04-18 DIAGNOSIS — R1013 Epigastric pain: Secondary | ICD-10-CM | POA: Diagnosis not present

## 2022-04-18 DIAGNOSIS — R0600 Dyspnea, unspecified: Secondary | ICD-10-CM | POA: Diagnosis not present

## 2022-04-18 DIAGNOSIS — R0609 Other forms of dyspnea: Secondary | ICD-10-CM | POA: Diagnosis not present

## 2022-04-18 DIAGNOSIS — J9811 Atelectasis: Secondary | ICD-10-CM | POA: Diagnosis not present

## 2022-04-18 DIAGNOSIS — I10 Essential (primary) hypertension: Secondary | ICD-10-CM | POA: Diagnosis not present

## 2022-04-18 MED ORDER — AMLODIPINE BESYLATE 10 MG PO TABS: 10.0000 mg | ORAL_TABLET | Freq: Every day | ORAL | 5 refills | Status: DC

## 2022-04-18 NOTE — Progress Notes (Signed)
04/18/22-per Epic-Echo has been ordered

## 2022-04-18 NOTE — Progress Notes (Signed)
   Subjective:    Patient ID: Melissa Barajas, female    DOB: 22-Apr-1941, 81 y.o.   MRN: [id number]  HPI 4 month follow up chronic medical concerns   SOB with activity started noticing this last week  She states this happens when she goes up steps as well as when she is trying to do housework or carry things she denies any chest pressure tightness or pain she had stress test done in 2016 Had pulmonary consult along with pulmonary function test back in 2019  Stomach has been burning , has been eating a lot of chocolate lately previously we had reduced her acid blocker from pantoprazole to famotidine but recently she has had epigastric pain and discomfort  Denies any severe dysphagia denies vomiting denies rectal bleeding Review of Systems     Objective:   Physical Exam General-in no acute distress Eyes-no discharge Lungs-respiratory rate normal, CTA CV-no murmurs,RRR Extremities skin warm dry no edema Neuro grossly normal Behavior normal, alert        Assessment & Plan:  1. Essential hypertension Blood pressure bump up dose of amlodipine new dose to 10 mg daily goal is to get blood pressure more like a 130/80 - Magnesium - Basic metabolic panel - Lipase - CBC with Differential/Platelet - Hepatic Function Panel  2. Epigastric pain She will be seeing Dr. Paulita Fujita gastroenterologist [facility] later this month check lab work will forward copy of labs to the specialist patient currently on famotidine.  Was on pantoprazole for years but we switched him to famotidine to lessen the effect of PPI on other associated issues with PPIs but given that her symptoms are happening on famotidine she may well need to go back to PPI  We did discuss switching back to the medication to a PPI today but she defers on this currently until she sees a specialist  She did have EGD approximately 2 years ago.  And also patient has an occasional dysphagia symptoms but not severe her main issue is epigastric  pain - Magnesium - Basic metabolic panel - Lipase - CBC with Differential/Platelet - Hepatic Function Panel  3. DOE (dyspnea on exertion) Shortness of breath occurs with minimal activity such as carrying laundry up the steps or doing household items she does not get short of breath with sitting still denies any chest pressure pain EKG does not show any acute changes we will go ahead with echo - EKG 12-Lead - Magnesium - Basic metabolic panel - Lipase - CBC with Differential/Platelet - Hepatic Function Panel - DG Chest 2 View  4. Cramps of lower extremity She complains of muscle cramps and discomfort will check metabolic 7 - Magnesium - Basic metabolic panel - Lipase - CBC with Differential/Platelet - Hepatic Function Panel  We will go ahead forward with echo as well  Follow-up again mid-September

## 2022-04-19 LAB — CBC WITH DIFFERENTIAL/PLATELET
Basophils Absolute: 0.1 10*3/uL (ref 0.0–0.2)
Basos: 1 %
EOS (ABSOLUTE): 0.2 10*3/uL (ref 0.0–0.4)
Eos: 2 %
Hematocrit: 44.2 % (ref 34.0–46.6)
Hemoglobin: 14.7 g/dL (ref 11.1–15.9)
Immature Grans (Abs): 0 10*3/uL (ref 0.0–0.1)
Immature Granulocytes: 0 %
Lymphocytes Absolute: 2.3 10*3/uL (ref 0.7–3.1)
Lymphs: 27 %
MCH: 31.1 pg (ref 26.6–33.0)
MCHC: 33.3 g/dL (ref 31.5–35.7)
MCV: 93 fL (ref 79–97)
Monocytes Absolute: 0.8 10*3/uL (ref 0.1–0.9)
Monocytes: 10 %
Neutrophils Absolute: 5.1 10*3/uL (ref 1.4–7.0)
Neutrophils: 60 %
Platelets: 242 10*3/uL (ref 150–450)
RBC: 4.73 x10E6/uL (ref 3.77–5.28)
RDW: 13.4 % (ref 11.7–15.4)
WBC: 8.5 10*3/uL (ref 3.4–10.8)

## 2022-04-19 LAB — MAGNESIUM: Magnesium: 2 mg/dL (ref 1.6–2.3)

## 2022-04-19 LAB — BASIC METABOLIC PANEL
BUN/Creatinine Ratio: 12 (ref 12–28)
BUN: 8 mg/dL (ref 8–27)
CO2: 24 mmol/L (ref 20–29)
Calcium: 10.1 mg/dL (ref 8.7–10.3)
Chloride: 103 mmol/L (ref 96–106)
Creatinine, Ser: 0.69 mg/dL (ref 0.57–1.00)
Glucose: 101 mg/dL — ABNORMAL HIGH (ref 70–99)
Potassium: 4.6 mmol/L (ref 3.5–5.2)
Sodium: 144 mmol/L (ref 134–144)
eGFR: 89 mL/min/{1.73_m2} (ref >59)

## 2022-04-19 LAB — HEPATIC FUNCTION PANEL
ALT: 14 IU/L (ref 0–32)
AST: 22 IU/L (ref 0–40)
Albumin: 4.6 g/dL (ref 3.8–4.8)
Alkaline Phosphatase: 111 IU/L (ref 44–121)
Bilirubin Total: 0.5 mg/dL (ref 0.0–1.2)
Bilirubin, Direct: 0.16 mg/dL (ref 0.00–0.40)
Total Protein: 7.5 g/dL (ref 6.0–8.5)

## 2022-04-19 LAB — LIPASE: Lipase: 69 U/L (ref 14–85)

## 2022-04-26 ENCOUNTER — Other Ambulatory Visit: Payer: Self-pay | Admitting: Gastroenterology

## 2022-04-26 DIAGNOSIS — R109 Unspecified abdominal pain: Secondary | ICD-10-CM

## 2022-04-26 DIAGNOSIS — R1013 Epigastric pain: Secondary | ICD-10-CM | POA: Diagnosis not present

## 2022-04-27 ENCOUNTER — Ambulatory Visit
Admission: RE | Admit: 2022-04-27 | Discharge: 2022-04-27 | Disposition: A | Discharge status: Home / Self Care | Payer: Medicare Other | Source: Ambulatory Visit | Attending: Gastroenterology | Admitting: Gastroenterology

## 2022-04-27 DIAGNOSIS — R109 Unspecified abdominal pain: Secondary | ICD-10-CM

## 2022-04-27 DIAGNOSIS — R110 Nausea: Secondary | ICD-10-CM | POA: Diagnosis not present

## 2022-05-23 ENCOUNTER — Ambulatory Visit: Payer: Medicare Other | Admitting: Family Medicine

## 2022-05-26 ENCOUNTER — Ambulatory Visit (INDEPENDENT_AMBULATORY_CARE_PROVIDER_SITE_OTHER): Payer: Medicare Other | Admitting: Family Medicine

## 2022-05-26 ENCOUNTER — Encounter: Payer: Self-pay | Admitting: Family Medicine

## 2022-05-26 VITALS — BP 138/80 | Wt 185.4 lb

## 2022-05-26 DIAGNOSIS — G5603 Carpal tunnel syndrome, bilateral upper limbs: Secondary | ICD-10-CM

## 2022-05-26 DIAGNOSIS — I10 Essential (primary) hypertension: Secondary | ICD-10-CM

## 2022-05-26 DIAGNOSIS — Z23 Encounter for immunization: Secondary | ICD-10-CM | POA: Diagnosis not present

## 2022-05-26 NOTE — Patient Instructions (Signed)
Your blood pressure is doing well  Keep everything as is  You may use the braces in the evening time/bedtime to help with carpal tunnel symptoms  If you are not doing better within the next month please let us know and we will help set you up with a specialist for further testing on carpal tunnel  Take care-if I can be of any help let me know otherwise we will see you back in about 5 months  TakeCare-Dr. Nicki Reaper

## 2022-05-26 NOTE — Progress Notes (Signed)
   Subjective:    Patient ID: Melissa Barajas, female    DOB: [birth year], 81 y.o.   MRN: [id number]  Hypertension This is a chronic problem. (Pt states vision is off; had a hard time seeing yesterday; pt has eye dr appt in Oct. ) There are no compliance problems.   Feels that her blood pressure is doing better  Pt having numbness in both hands; mostly in right hand.  Gets worse with driving gets worse with vacuuming and using certain movements.   Review of Systems     Objective:   Physical Exam General-in no acute distress Eyes-no discharge Lungs-respiratory rate normal, CTA CV-no murmurs,RRR Extremities skin warm dry no edema Neuro grossly normal Behavior normal, alert        Assessment & Plan:  Blood pressure doing better Continue current measures Probable carpal tunnel recommend braces at night if not dramatically better over the next month notify us we will set up nerve conduction study Flu shot today Follow-up within 5 months

## 2022-06-08 ENCOUNTER — Ambulatory Visit: Payer: Medicare Other | Admitting: Obstetrics & Gynecology

## 2022-06-08 ENCOUNTER — Encounter: Payer: Self-pay | Admitting: Obstetrics & Gynecology

## 2022-06-08 VITALS — BP 164/79 | HR 78 | Ht 66.0 in | Wt 186.0 lb

## 2022-06-08 DIAGNOSIS — Z1231 Encounter for screening mammogram for malignant neoplasm of breast: Secondary | ICD-10-CM

## 2022-06-08 DIAGNOSIS — M940 Chondrocostal junction syndrome [Tietze]: Secondary | ICD-10-CM

## 2022-06-08 NOTE — Progress Notes (Signed)
Chief Complaint  Patient presents with   Breast Problem    Left breast hurts. Needs yearly breast exam      81 y.o. G6P0020 No LMP recorded. Patient has had a hysterectomy. The current method of family planning is status post hysterectomy.  Outpatient Encounter Medications as of 06/08/2022  Medication Sig Note   amLODipine (NORVASC) 10 MG tablet Take 1 tablet (10 mg total) by mouth daily.    Ascorbic Acid (VITAMIN C) 1000 MG tablet Take 1,000 mg by mouth daily.    Cholecalciferol (VITAMIN D3) 50 MCG (2000 UT) TABS Take 2,000 Units by mouth daily.    cloNIDine (CATAPRES) 0.1 MG tablet Take 1 tablet (0.1 mg total) by mouth 2 (two) times daily.    diclofenac (VOLTAREN) 75 MG EC tablet 1 twice daily as needed    famotidine (PEPCID) 40 MG tablet Take 1 tablet (40 mg total) by mouth daily.    fluticasone furoate-vilanterol (BREO ELLIPTA) 200-25 MCG/ACT AEPB USE 1 INHALATION BY MOUTH  DAILY    Phenazopyridine HCl (AZO TABS PO) Take by mouth.    Polyethyl Glycol-Propyl Glycol (SYSTANE OP) Place 2 drops into both eyes 2 (two) times daily.  08/24/2020: Blink eye drops   potassium chloride (KLOR-CON M) 10 MEQ tablet Take 1 tablet (10 mEq total) by mouth daily.    rosuvastatin (CRESTOR) 20 MG tablet TAKE 1 TABLET BY MOUTH ON MONDAY AND 1 TABLET ON FRIDAY    Specialty Vitamins Products (BRAIN PO) Take 1 capsule by mouth daily. FOREBRAIN COGNITIVE PERFORMANCE SUPPLEMENT    No facility-administered encounter medications on file as of 06/08/2022.    Subjective Ms. Flow comes in today for evaluation of left breast pain She states she has had it on and off for years I saw her last year in September and attributed it to some tenderness around the previous cystectomy site Her screening mammogram in December was normal She denies discharge skin changes ulcers or abscess  Past Medical History:  Diagnosis Date   Arthritis    Atrial fibrillation ([place])    Collagen vascular disease ([place])     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 screening colonoscopy in 10 years 2. She should follow a high fiber diet She has been given a hand out on high fiber diverticulosois and hemorrhoids   COLONOSCOPY N/A 02/07/2018   Procedure: COLONOSCOPY;  Surgeon: Danie Binder, MD;  [facility];  Service: Endoscopy;  Laterality: N/A;  1:00pm   CYSTECTOMY     Left breast   ESOPHAGOGASTRODUODENOSCOPY     UXN:[id number] appearing Schatzki's ring, possible cervical esophageal web, both dilated with passage of a Maloney dilator/small hiatal hernia   ESOPHAGOGASTRODUODENOSCOPY N/A 10/25/2015   Procedure: ESOPHAGOGASTRODUODENOSCOPY (EGD);  Surgeon: Danie Binder, MD;  [facility];  Service: Endoscopy;  Laterality: N/A;  1215pm   ESOPHAGOGASTRODUODENOSCOPY N/A 10/24/2019   Schatzki's ring s/p dilation and single medium firm, probable submucosal nodule without bleeding. Medium sized hiatal hernia, normal duodenum.    ESOPHAGOGASTRODUODENOSCOPY N/A 04/05/2020   10 mm leiomyoma and capsule study without obvious lesion or bleeding.   EUS  11/2019   Dr. Paulita Fujita: EUS completed March 2021 with likely leiomyoma, no other concerns. 10 mm by 10 mm.    GIVENS CAPSULE STUDY N/A 04/06/2020   Procedure: GIVENS CAPSULE STUDY;  Surgeon: Rogene Houston, MD;  [facility];  Service: Endoscopy;  Laterality: N/A;   POLYPECTOMY   02/07/2018   Procedure: POLYPECTOMY;  Surgeon: Danie Binder, MD;  [facility];  Service: Endoscopy;;  ascending colon, descending, sigmoid   SAVORY DILATION N/A 10/25/2015   Procedure: SAVORY DILATION;  Surgeon: Danie Binder, MD;  [facility];  Service: Endoscopy;  Laterality: N/A;   SAVORY DILATION N/A 10/24/2019   Procedure: SAVORY DILATION;  Surgeon: Danie Binder, MD;  [facility];  Service: Endoscopy;  Laterality: N/A;   THYROIDECTOMY     Tumor   05/2010   Left breast under nipple   VESICOVAGINAL FISTULA CLOSURE W/ TAH     fibroid tumors    OB History     Gravida  6   Para      Term      Preterm      AB  2   Living         SAB      IAB      Ectopic      Multiple      Live Births              Allergies  Allergen Reactions   Aricept [Donepezil Hcl]     Sleep deprived   Hydrocodone Nausea Only   Lisinopril Other (See Comments)    Angio edema   Naprosyn [Naproxen] Nausea Only   Vioxx [Rofecoxib]     Unknown reaction     Social History   Socioeconomic History   Marital status: Widowed    Spouse name: Not on file   Number of children: Not on file   Years of education: Not on file   Highest education level: Not on file  Occupational History   Not on file  Tobacco Use   Smoking status: Former    Packs/day: 1.00    Years: 15.00    Total pack years: 15.00    Types: Cigarettes    Start date: 09/12/1983    Quit date: 09/11/1993    Years since quitting: 28.7   Smokeless tobacco: Never  Vaping Use   Vaping Use: Never used  Substance and Sexual Activity   Alcohol use: No    Alcohol/week: 0.0 standard drinks of alcohol   Drug use: No   Sexual activity: Not Currently    Birth control/protection: Post-menopausal, Surgical  Other Topics Concern   Not on file  Social History Narrative   Not on file   Social Determinants of Health   Financial Resource Strain: Low Risk  (01/17/2022)   Overall Financial  Resource Strain (CARDIA)    Difficulty of Paying Living Expenses: Not hard at all  Food Insecurity: No Food Insecurity (01/17/2022)   Hunger Vital Sign    Worried About Running Out of Food in the Last Year: Never true    [place] in the Last Year:  Never true  Transportation Needs: No Transportation Needs (01/17/2022)   PRAPARE - Hydrologist (Medical): No    Lack of Transportation (Non-Medical): No  Physical Activity: Sufficiently Active (01/17/2022)   Exercise Vital Sign    Days of Exercise per Week: 5 days    Minutes of Exercise per Session: 30 min  Stress: No Stress Concern Present (01/17/2022)   [place]    Feeling of Stress : Not at all  Social Connections: Moderately Integrated (01/17/2022)   Social Connection and Isolation Panel [NHANES]    Frequency of Communication with Friends and Family: More than three times a week    Frequency of Social Gatherings with Friends and Family: More than three times a week    Attends Religious Services: More than 4 times per year    Active Member of [employer] or Organizations: Yes    Attends Archivist Meetings: More than 4 times per year    Marital Status: Widowed    Family History  Problem Relation Age of Onset   Cancer Brother        Prostate    Medications:       Current Outpatient Medications:    amLODipine (NORVASC) 10 MG tablet, Take 1 tablet (10 mg total) by mouth daily., Disp: 30 tablet, Rfl: 5   Ascorbic Acid (VITAMIN C) 1000 MG tablet, Take 1,000 mg by mouth daily., Disp: , Rfl:    Cholecalciferol (VITAMIN D3) 50 MCG (2000 UT) TABS, Take 2,000 Units by mouth daily., Disp: , Rfl:    cloNIDine (CATAPRES) 0.1 MG tablet, Take 1 tablet (0.1 mg total) by mouth 2 (two) times daily., Disp: 180 tablet, Rfl: 3   diclofenac (VOLTAREN) 75 MG EC tablet, 1 twice daily as needed, Disp: 30 tablet, Rfl: 0   famotidine (PEPCID) 40 MG tablet, Take  1 tablet (40 mg total) by mouth daily., Disp: 90 tablet, Rfl: 3   fluticasone furoate-vilanterol (BREO ELLIPTA) 200-25 MCG/ACT AEPB, USE 1 INHALATION BY MOUTH  DAILY, Disp: 180 each, Rfl: 4   Phenazopyridine HCl (AZO TABS PO), Take by mouth., Disp: , Rfl:    Polyethyl Glycol-Propyl Glycol (SYSTANE OP), Place 2 drops into both eyes 2 (two) times daily. , Disp: , Rfl:    potassium chloride (KLOR-CON M) 10 MEQ tablet, Take 1 tablet (10 mEq total) by mouth daily., Disp: 90 tablet, Rfl: 3   rosuvastatin (CRESTOR) 20 MG tablet, TAKE 1 TABLET BY MOUTH ON MONDAY AND 1 TABLET ON FRIDAY, Disp: 26 tablet, Rfl: 4   Specialty Vitamins Products (BRAIN PO), Take 1 capsule by mouth daily. FOREBRAIN COGNITIVE PERFORMANCE SUPPLEMENT, Disp: , Rfl:   Objective Blood pressure (!) 164/79, pulse 78, height '5\' 6"'$  (1.676 m), weight 186 lb (84.4 kg).  Gen WDWN NAD On exam today I move the left breast off the chest wall medially and examine her chest wall in the midaxillary line and she has tenderness from about the fourth rib down to the seventh or eighth rib No masses are palpated Axilla is negative Isolating the left breast itself and examining that without pushing on the chest wall there is no tenderness additionally there are no masses palpable in the breast And really today there is no tenderness around the previous cystectomy scar The right breast is without skin changes no masses are palpable and is nontender  I explained to the patient how I was manipulating the breast on the chest wall  to isolate the source of pain and she seems to understand that the pain is specifically a chest wall and that if you allow the breast to fall normally the breast is over it but if we move the breast away from the chest wall medially and examined the breast there is no tenderness but the tenderness remains in the same place in the chest wall  Pertinent ROS No burning with urination, frequency or urgency No nausea, vomiting or  diarrhea Nor fever chills or other constitutional symptoms   Labs or studies Reviewed mammogram from last year    Impression + Management Plan: Diagnoses this Encounter::   ICD-10-CM   1. Costochondritis, acute on chronic  M94.0    episodic flares, not breast at all as far as I candetermine, normal mammogram 12/22, recommend keep routine screening    2. Breast cancer screening by mammogram  Z12.31 MM 3D SCREEN BREAST BILATERAL        Medications prescribed during  this encounter: No orders of the defined types were placed in this encounter.   Labs or Scans Ordered during this encounter: Orders Placed This Encounter  Procedures   MM 3D SCREEN BREAST BILATERAL      Follow up Return if symptoms worsen or fail to improve.

## 2022-06-21 ENCOUNTER — Ambulatory Visit (HOSPITAL_COMMUNITY)
Admission: RE | Admit: 2022-06-21 | Discharge: 2022-06-21 | Disposition: A | Discharge status: Home / Self Care | Payer: Medicare Other | Source: Ambulatory Visit | Attending: Family Medicine | Admitting: Family Medicine

## 2022-06-21 DIAGNOSIS — R0609 Other forms of dyspnea: Secondary | ICD-10-CM | POA: Insufficient documentation

## 2022-06-21 LAB — ECHOCARDIOGRAM COMPLETE
AR max vel: 2.54 cm2
AV Area VTI: 2.55 cm2
AV Area mean vel: 2.66 cm2
AV Mean grad: 4 mmHg
AV Peak grad: 7.4 mmHg
Ao pk vel: 1.36 m/s
Area-P 1/2: 3.72 cm2
MV VTI: 2.68 cm2
S' Lateral: 3.5 cm

## 2022-07-24 ENCOUNTER — Encounter (INDEPENDENT_AMBULATORY_CARE_PROVIDER_SITE_OTHER): Payer: Self-pay | Admitting: Gastroenterology

## 2022-08-01 ENCOUNTER — Ambulatory Visit (INDEPENDENT_AMBULATORY_CARE_PROVIDER_SITE_OTHER): Payer: Medicare Other | Admitting: Family Medicine

## 2022-08-01 ENCOUNTER — Encounter: Payer: Self-pay | Admitting: Family Medicine

## 2022-08-01 VITALS — BP 136/76 | Wt 181.0 lb

## 2022-08-01 DIAGNOSIS — R509 Fever, unspecified: Secondary | ICD-10-CM

## 2022-08-01 DIAGNOSIS — J989 Respiratory disorder, unspecified: Secondary | ICD-10-CM | POA: Diagnosis not present

## 2022-08-01 DIAGNOSIS — G5603 Carpal tunnel syndrome, bilateral upper limbs: Secondary | ICD-10-CM | POA: Diagnosis not present

## 2022-08-01 DIAGNOSIS — R200 Anesthesia of skin: Secondary | ICD-10-CM

## 2022-08-01 DIAGNOSIS — M19049 Primary osteoarthritis, unspecified hand: Secondary | ICD-10-CM

## 2022-08-01 DIAGNOSIS — I10 Essential (primary) hypertension: Secondary | ICD-10-CM | POA: Diagnosis not present

## 2022-08-01 NOTE — Progress Notes (Signed)
   Subjective:    Patient ID: Melissa Barajas, female    DOB: September 21, 1940, 81 y.o.   MRN: [id number]  HPI Pt arrives to follow up on echo results. Echo completed 06/21/22.  Pt states she is getting over the flu.  Hand numbness - Plan: Ambulatory referral to Neurology  Essential hypertension  Bilateral carpal tunnel syndrome - Plan: Ambulatory referral to Neurology  Respiratory illness with fever  Osteoarthritis of hand, unspecified laterality, unspecified osteoarthritis type Patient for blood pressure check up.  The patient does have hypertension.   Patient relates dietary measures try to minimize salt The importance of healthy diet and activity were discussed Patient relates compliance  Relates bilateral hand numbness and tingling despite wearing braces wakes her up frequently happens frequently during the day  Recent respiratory illness with head congestion drainage coughing  Review of Systems     Objective:   Physical Exam  General-in no acute distress Eyes-no discharge Lungs-respiratory rate normal, CTA CV-no murmurs,RRR Extremities skin warm dry no edema Neuro grossly normal Behavior normal, alert       Assessment & Plan:  1. Hand numbness Recommend neurology consult for NCV may need Ortho after that depending on if it finds carpal tunnel - Ambulatory referral to Neurology  2. Essential hypertension HTN- patient seen for follow-up regarding HTN.   Diet, medication compliance, appropriate labs and refills were completed.   Importance of keeping blood pressure under good control to lessen the risk of complications discussed Regular follow-up visits discussed Healthy eating regular activity consuming mainly vegetables and fruits minimizing salts  3. Bilateral carpal tunnel syndrome Please see above - Ambulatory referral to Neurology  4. Respiratory illness with fever Recent illness prior to the weekend doing better no sign of any pneumonia or significant  bacterial infection currently hold off on any antibiotics but if fevers progressive symptoms or other problems follow-up immediately  5. Osteoarthritis of hand, unspecified laterality, unspecified osteoarthritis type Tylenol as needed stay away from anti-inflammatories if possible

## 2022-08-07 DIAGNOSIS — K3189 Other diseases of stomach and duodenum: Secondary | ICD-10-CM | POA: Diagnosis not present

## 2022-08-07 DIAGNOSIS — R109 Unspecified abdominal pain: Secondary | ICD-10-CM | POA: Diagnosis not present

## 2022-08-23 DIAGNOSIS — H35033 Hypertensive retinopathy, bilateral: Secondary | ICD-10-CM | POA: Diagnosis not present

## 2022-08-23 DIAGNOSIS — I10 Essential (primary) hypertension: Secondary | ICD-10-CM | POA: Diagnosis not present

## 2022-08-23 DIAGNOSIS — H26493 Other secondary cataract, bilateral: Secondary | ICD-10-CM | POA: Diagnosis not present

## 2022-08-23 DIAGNOSIS — Z961 Presence of intraocular lens: Secondary | ICD-10-CM | POA: Diagnosis not present

## 2022-08-23 NOTE — Addendum Note (Signed)
Addended by: [name] on: 08/23/2022 11:46 AM   Modules accepted: Orders

## 2022-09-07 DIAGNOSIS — Z1231 Encounter for screening mammogram for malignant neoplasm of breast: Secondary | ICD-10-CM | POA: Diagnosis not present

## 2022-09-07 LAB — HM MAMMOGRAPHY

## 2022-09-12 ENCOUNTER — Encounter (HOSPITAL_COMMUNITY): Payer: Self-pay | Admitting: Emergency Medicine

## 2022-09-12 ENCOUNTER — Emergency Department (HOSPITAL_COMMUNITY)
Admission: EM | Admit: 2022-09-12 | Discharge: 2022-09-12 | Disposition: A | Discharge status: Home / Self Care | Payer: BLUE CROSS/BLUE SHIELD | Attending: Emergency Medicine | Admitting: Emergency Medicine

## 2022-09-12 ENCOUNTER — Emergency Department (HOSPITAL_COMMUNITY): Payer: BLUE CROSS/BLUE SHIELD

## 2022-09-12 ENCOUNTER — Other Ambulatory Visit: Payer: Self-pay

## 2022-09-12 DIAGNOSIS — R519 Headache, unspecified: Secondary | ICD-10-CM | POA: Diagnosis not present

## 2022-09-12 DIAGNOSIS — I671 Cerebral aneurysm, nonruptured: Secondary | ICD-10-CM | POA: Diagnosis not present

## 2022-09-12 DIAGNOSIS — Z79899 Other long term (current) drug therapy: Secondary | ICD-10-CM | POA: Insufficient documentation

## 2022-09-12 DIAGNOSIS — I10 Essential (primary) hypertension: Secondary | ICD-10-CM | POA: Insufficient documentation

## 2022-09-12 DIAGNOSIS — I6523 Occlusion and stenosis of bilateral carotid arteries: Secondary | ICD-10-CM | POA: Diagnosis not present

## 2022-09-12 DIAGNOSIS — M542 Cervicalgia: Secondary | ICD-10-CM | POA: Diagnosis not present

## 2022-09-12 DIAGNOSIS — M436 Torticollis: Secondary | ICD-10-CM | POA: Diagnosis not present

## 2022-09-12 DIAGNOSIS — I6503 Occlusion and stenosis of bilateral vertebral arteries: Secondary | ICD-10-CM | POA: Diagnosis not present

## 2022-09-12 DIAGNOSIS — I672 Cerebral atherosclerosis: Secondary | ICD-10-CM | POA: Diagnosis not present

## 2022-09-12 LAB — CBC WITH DIFFERENTIAL/PLATELET
Abs Immature Granulocytes: 0.02 10*3/uL (ref 0.00–0.07)
Basophils Absolute: 0 10*3/uL (ref 0.0–0.1)
Basophils Relative: 1 %
Eosinophils Absolute: 0.2 10*3/uL (ref 0.0–0.5)
Eosinophils Relative: 3 %
HCT: 42 % (ref 36.0–46.0)
Hemoglobin: 13.9 g/dL (ref 12.0–15.0)
Immature Granulocytes: 0 %
Lymphocytes Relative: 30 %
Lymphs Abs: 2.2 10*3/uL (ref 0.7–4.0)
MCH: 31.6 pg (ref 26.0–34.0)
MCHC: 33.1 g/dL (ref 30.0–36.0)
MCV: 95.5 fL (ref 80.0–100.0)
Monocytes Absolute: 0.8 10*3/uL (ref 0.1–1.0)
Monocytes Relative: 11 %
Neutro Abs: 4 10*3/uL (ref 1.7–7.7)
Neutrophils Relative %: 55 %
Platelets: 237 10*3/uL (ref 150–400)
RBC: 4.4 MIL/uL (ref 3.87–5.11)
RDW: 14.4 % (ref 11.5–15.5)
WBC: 7.3 10*3/uL (ref 4.0–10.5)
nRBC: 0 % (ref 0.0–0.2)

## 2022-09-12 LAB — BASIC METABOLIC PANEL
Anion gap: 8 (ref 5–15)
BUN: 10 mg/dL (ref 8–23)
CO2: 26 mmol/L (ref 22–32)
Calcium: 9.6 mg/dL (ref 8.9–10.3)
Chloride: 103 mmol/L (ref 98–111)
Creatinine, Ser: 0.62 mg/dL (ref 0.44–1.00)
GFR, Estimated: >60 mL/min (ref >60)
Glucose, Bld: 103 mg/dL — ABNORMAL HIGH (ref 70–99)
Potassium: 3.8 mmol/L (ref 3.5–5.1)
Sodium: 137 mmol/L (ref 135–145)

## 2022-09-12 MED ORDER — ONDANSETRON HCL 4 MG/2ML IJ SOLN
4.0000 mg | Freq: Once | INTRAMUSCULAR | Status: AC
  Administered 2022-09-12: 4 mg via INTRAVENOUS
  Filled 2022-09-12: qty 2

## 2022-09-12 MED ORDER — IOHEXOL 350 MG/ML SOLN
75.0000 mL | Freq: Once | INTRAVENOUS | Status: AC | PRN
  Administered 2022-09-12: 75 mL via INTRAVENOUS

## 2022-09-12 MED ORDER — MORPHINE SULFATE (PF) 2 MG/ML IV SOLN
2.0000 mg | Freq: Once | INTRAVENOUS | Status: AC
  Administered 2022-09-12: 2 mg via INTRAVENOUS
  Filled 2022-09-12: qty 1

## 2022-09-12 NOTE — Discharge Instructions (Addendum)
As you have a very small aneurysm found on today's CT imaging, although this is not causing your symptoms today this will require monitoring to make sure it is not changing, specifically not getting larger.  Please call Dr. Reatha Armour, listed above to get an office visit with him for this condition.

## 2022-09-12 NOTE — ED Provider Notes (Signed)
Patient signed out to me from [place], PA-C at shift change.  Please refer to her note for complete details.  Essentially patient presents with a 5-day history of headache described as sharp pain from the top of her head radiating into her neck, initially neck pain was worse on the left but now bilateral, worse with movement, no focal weakness, occasional blurred vision and some mild dizziness also present.  CT angio head and neck obtained to rule out vertebral dissection or other intracranial source of her symptoms.  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  However, she does have a 4 mm aneurysm, felt to be incidental to todays sx of headache as no bleeding/rupture, probably a stable finding and not source of todays sx,  However she will need outpatient f/u with neurosurgery for monitoring.  Discussed at length with patient and her questions were answered.  She understands she will need f/u care and will call Dr. Reatha Armour for OV - referral given.   Discussed with Dr. Melina Copa prior to dc home.  Pt referred to Dr Dawley of neurosurgery for outpt f/u care.   Results for orders placed or performed during the hospital encounter of 09/12/22  CBC with Differential  Result Value Ref Range   WBC 7.3 4.0 - 10.5 K/uL   RBC 4.40 3.87 - 5.11 MIL/uL   Hemoglobin 13.9 12.0 - 15.0 g/dL   HCT 42.0 36.0 - 46.0 %   MCV 95.5 80.0 - 100.0 fL   MCH 31.6 26.0 - 34.0 pg   MCHC 33.1 30.0 - 36.0 g/dL   RDW 14.4 11.5 - 15.5 %   Platelets 237 150 - 400 K/uL   nRBC 0.0 0.0 - 0.2 %   Neutrophils Relative % 55 %   Neutro Abs 4.0 1.7 - 7.7 K/uL   Lymphocytes Relative 30 %   Lymphs Abs 2.2 0.7 - 4.0 K/uL   Monocytes Relative 11 %   Monocytes Absolute 0.8 0.1 - 1.0 K/uL   Eosinophils Relative 3 %   Eosinophils Absolute 0.2 0.0 - 0.5 K/uL   Basophils Relative 1 %   Basophils Absolute 0.0 0.0 - 0.1 K/uL   Immature Granulocytes 0  %   Abs Immature Granulocytes 0.02 0.00 - 0.07 K/uL  Basic metabolic panel  Result Value Ref Range   Sodium 137 135 - 145 mmol/L   Potassium 3.8 3.5 - 5.1 mmol/L   Chloride 103 98 - 111 mmol/L   CO2 26 22 - 32 mmol/L   Glucose, Bld 103 (H) 70 - 99 mg/dL   BUN 10 8 - 23 mg/dL   Creatinine, Ser 0.62 0.44 - 1.00 mg/dL   Calcium 9.6 8.9 - 10.3 mg/dL   GFR, Estimated >60 >60 mL/min   Anion gap 8 5 - 15   CT ANGIO HEAD NECK W WO CM  Result Date: 09/12/2022 CLINICAL DATA:  Sudden onset severe headache with neck pain EXAM: CT ANGIOGRAPHY HEAD AND NECK TECHNIQUE: Multidetector CT imaging of the head and neck was performed using the standard protocol during bolus administration of intravenous contrast. Multiplanar CT image reconstructions and MIPs were obtained to evaluate the vascular anatomy. Carotid stenosis measurements (when applicable) are obtained utilizing NASCET criteria, using the distal internal carotid diameter as the denominator. RADIATION DOSE REDUCTION: This exam was performed according to the departmental dose-optimization program which includes automated exposure control, adjustment of the mA and/or kV according to patient size  and/or use of iterative reconstruction technique. CONTRAST:  23m OMNIPAQUE IOHEXOL 350 MG/ML SOLN COMPARISON:  09/08/2011 FINDINGS: CT HEAD FINDINGS Brain: No hemorrhage, mass or extra-axial collection. There is periventricular hypoattenuation compatible with chronic microvascular disease. Vascular: Mild calcific atherosclerosis of the internal carotid arteries at the skull base. Skull: Negative Sinuses/Orbits: Normal Other: None Review of the MIP images confirms the above findings CTA NECK FINDINGS Aortic arch: Calcific aortic atherosclerosis. Normal branching pattern. Proximal subclavian arteries are normal. Right carotid system: No evidence of dissection, stenosis (50% or greater), or occlusion. Left carotid system: No evidence of dissection, stenosis (50% or  greater), or occlusion. Vertebral arteries: Right dominant. 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 Right vertebral artery is normal. Skeleton: Negative. Other neck: Negative. Upper chest: Negative. Review of the MIP images confirms the above findings CTA HEAD FINDINGS Anterior circulation: There is mild calcific atherosclerosis of the internal carotid arteries at the skull base. There is an inferiorly directed anterior communicating artery aneurysm the measures 4 mm. The anterior and middle cerebral arteries are otherwise normal. Posterior circulation: No significant stenosis, proximal occlusion, aneurysm, or vascular malformation. Right P-comm is patent. The basilar and posterior cerebral arteries are normal. There is reconstitution of the left vertebral artery V4 segment via the PICA collateral pathway. Venous sinuses: As permitted by contrast timing, patent. Anatomic variants: None Review of the MIP images confirms the above findings IMPRESSION: 1. 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 2. No other occlusion or stenosis. 3. A 4 mm anterior communicating artery aneurysm. 4. No acute intracranial abnormality. Aortic Atherosclerosis (ICD10-I70.0). Electronically Signed   By: KUlyses JarredM.D.   On: 09/12/2022 20:02      ILandis Martins01/03/24 [id number]    BHayden Rasmussen MD 09/14/22 1101

## 2022-09-12 NOTE — ED Provider Notes (Signed)
[facility] Provider Note   CSN: [id number] Arrival date & time: 09/12/22  1242     History  Chief Complaint  Patient presents with   Torticollis    Melissa Barajas is a 82 y.o. female.  HPI     Melissa Barajas is a 82 y.o. female with past medical history of hypertension, atrial fibrillation, GERD, who presents to the Emergency Department complaining of headache and bilateral neck pain.  Symptoms present for 5 days.  Describes onset of sharp pain to the top of her head that radiated into her neck. Head pain was rather sudden onset without preceding symptoms, but not described as severe or like thunderclap.  Initially neck pain was worse on the left now is bilateral.  Pain is associated with movement of her neck as well.  No known injury.  She denies any numbness or weakness of her face or upper extremities.  She tried Tylenol last evening with minimal relief.  States that she had similar symptoms 3 months ago that spontaneously resolved.  States she took some over-the-counter "stress tablets" that initially seem to be helping.  Her symptoms have been associated with some mild dizziness and occasional blurred vision.  She denies any chest pain or shortness of breath, fever or chills.  No neck stiffness.   Home Medications Prior to Admission medications   Medication Sig Start Date End Date Taking? Authorizing Provider  amLODipine (NORVASC) 10 MG tablet Take 1 tablet (10 mg total) by mouth daily. 04/18/22   Kathyrn Drown, MD  Ascorbic Acid (VITAMIN C) 1000 MG tablet Take 1,000 mg by mouth daily.    [provider]  Cholecalciferol (VITAMIN D3) 50 MCG (2000 UT) TABS Take 2,000 Units by mouth daily.    [provider]  cloNIDine (CATAPRES) 0.1 MG tablet Take 1 tablet (0.1 mg total) by mouth 2 (two) times daily. 11/04/21   Kathyrn Drown, MD  diclofenac (VOLTAREN) 75 MG EC tablet 1 twice daily as needed 11/28/21   Kathyrn Drown, MD  famotidine (PEPCID) 40  MG tablet Take 1 tablet (40 mg total) by mouth daily. 11/04/21   Kathyrn Drown, MD  fluticasone furoate-vilanterol (BREO ELLIPTA) 200-25 MCG/ACT AEPB USE 1 INHALATION BY MOUTH  DAILY 11/04/21   Kathyrn Drown, MD  Phenazopyridine HCl (AZO TABS PO) Take by mouth.    [provider]  Polyethyl Glycol-Propyl Glycol (SYSTANE OP) Place 2 drops into both eyes 2 (two) times daily.     [provider]  potassium chloride (KLOR-CON M) 10 MEQ tablet Take 1 tablet (10 mEq total) by mouth daily. 11/04/21   Kathyrn Drown, MD  rosuvastatin (CRESTOR) 20 MG tablet TAKE 1 TABLET BY MOUTH ON MONDAY AND 1 TABLET ON FRIDAY 11/04/21   Kathyrn Drown, MD  Specialty Vitamins Products (BRAIN PO) Take 1 capsule by mouth daily. [place]    [provider]      Allergies    Aricept [donepezil hcl], Hydrocodone, Lisinopril, Naprosyn [naproxen], and Vioxx [rofecoxib]    Review of Systems   Review of Systems  Constitutional:  Negative for appetite change, chills and fever.  HENT:  Negative for sore throat.   Eyes:  Positive for visual disturbance (Occasional blurred vision).  Respiratory:  Negative for cough and shortness of breath.   Cardiovascular:  Negative for chest pain.  Gastrointestinal:  Negative for abdominal pain, nausea and vomiting.  Genitourinary:  Negative for difficulty urinating.  Musculoskeletal:  Positive for neck pain. Negative for arthralgias, back pain and neck stiffness.  Skin:  Negative for rash.  Neurological:  Positive for dizziness and headaches. Negative for syncope, facial asymmetry, speech difficulty, weakness and numbness.  Psychiatric/Behavioral:  Negative for confusion.     Physical Exam Updated Vital Signs BP (!) 186/76 (BP Location: Right Arm)   Pulse 70   Temp 98.2 F (36.8 C) (Oral)   Resp 18   Ht '5\' 6"'$  (1.676 m)   Wt 81.6 kg   SpO2 95%   BMI 29.05 kg/m  Physical Exam Vitals and nursing note reviewed.   Constitutional:      General: She is not in acute distress.    Appearance: She is not toxic-appearing.     Comments: Patient uncomfortable appearing but nontoxic  HENT:     Head: Atraumatic.     Right Ear: Tympanic membrane and ear canal normal.     Left Ear: Tympanic membrane and ear canal normal.     Nose: Nose normal.     Mouth/Throat:     Mouth: Mucous membranes are moist.     Pharynx: Oropharynx is clear.  Eyes:     Extraocular Movements: Extraocular movements intact.     Conjunctiva/sclera: Conjunctivae normal.     Pupils: Pupils are equal, round, and reactive to light.  Neck:     Vascular: No JVD.     Trachea: Phonation normal.     Meningeal: Kernig's sign absent.     Comments: Tender to palpation along the cervical spine and bilateral cervical paraspinal muscles. Cardiovascular:     Rate and Rhythm: Normal rate and regular rhythm.     Pulses: Normal pulses.  Pulmonary:     Effort: Pulmonary effort is normal.     Breath sounds: Normal breath sounds.  Abdominal:     Palpations: Abdomen is soft.     Tenderness: There is no abdominal tenderness.  Musculoskeletal:        General: Normal range of motion.     Cervical back: Tenderness present. No rigidity. Spinous process tenderness and muscular tenderness present.     Right lower leg: No edema.     Left lower leg: No edema.  Lymphadenopathy:     Cervical: No cervical adenopathy.     Right cervical: No superficial cervical adenopathy.    Left cervical: No superficial cervical adenopathy.  Skin:    General: Skin is warm.     Capillary Refill: Capillary refill takes less than 2 seconds.  Neurological:     General: No focal deficit present.     Mental Status: She is alert.     GCS: GCS eye subscore is 4. GCS verbal subscore is 5. GCS motor subscore is 6.     Cranial Nerves: Cranial nerves 2-12 are intact.     Sensory: Sensation is intact. No sensory deficit.     Motor: Motor function is intact. No weakness, abnormal  muscle tone or pronator drift.     Comments: CN II through XII intact.  Speech clear.  No facial droop.  No pronator drift.     ED Results / Procedures / Treatments   Labs (all labs ordered are listed, but only abnormal results are displayed) Labs Reviewed  BASIC METABOLIC PANEL - Abnormal; Notable for the following components:      Result Value   Glucose, Bld 103 (*)    All other components within normal limits  CBC WITH DIFFERENTIAL/PLATELET    EKG None  Radiology No results found.  Procedures Procedures    Medications Ordered in ED Medications - No data to display  ED Course/ Medical Decision Making/ A&P                           Medical Decision Making Patient here with 5-day history of headache, neck pain, intermittent blurred vision and dizziness.  Describes similar symptoms 3 months ago that spontaneously resolved.  Describes headache as somewhat sudden in onset but not thunderclap.  No facial or extremity weakness reported.  On exam, patient uncomfortable appearing but nontoxic.  Vital signs mildly hypertensive but otherwise reassuring.  Not appreciate any nuchal rigidity, or focal neurodeficits.  Differential would include but not limited to atypical migraine headache, vertebral artery aneurysm/dissection, infectious process, skeletal injury, torticollis.  Given patient's age and recurrent process, I feel that further workup is warranted including CTA head and neck and labs.  Amount and/or Complexity of Data Reviewed Labs:     Details: Labs no evidence of leukocytosis, anemia.  Chemistries w/o significant findings.   Radiology: ordered.    Details: CTA head and neck ordered, results pending.   Discussion of management or test interpretation with external provider(s): On recheck, patient resting comfortably.  CT imaging still pending. Discussed with Evalee Jefferson, PA-C who is agreeable to review imaging .  If workup negative, patient possibly able to be discharged  home.  If any concerning findings on CT.  Patient may need hospital admission for MRI imaging in the morning which is unfortunately unavailable after hours.  Risk Prescription drug management.           Final Clinical Impression(s) / ED Diagnoses Final diagnoses:  Neck pain  Acute nonintractable headache, unspecified headache type    Rx / DC Orders ED Discharge Orders     None         Bufford Lope 09/12/22 1916    Hayden Rasmussen, MD 09/13/22 1151

## 2022-09-12 NOTE — ED Notes (Signed)
Patient transported to CT 

## 2022-09-12 NOTE — ED Triage Notes (Signed)
Pt c/o head and neck pain that "catches" when she turns her head a certain way x 5 days ago

## 2022-09-14 DIAGNOSIS — K080 Exfoliation of teeth due to systemic causes: Secondary | ICD-10-CM | POA: Diagnosis not present

## 2022-09-15 ENCOUNTER — Telehealth: Payer: Self-pay

## 2022-09-15 NOTE — Telephone Encounter (Signed)
     Patient  visit on 09/12/2022  at [facility] was for neck pain; acute nonintractable headache.  Have you been able to follow up with your primary care physician? Patient has 09/27/22 appointment with Neurologist.  The patient was or was not able to obtain any needed medicine or equipment. No medications prescribed.  Are there diet recommendations that you are having difficulty following? No  Patient expresses understanding of discharge instructions and education provided has no other needs at this time.    [place] Resource Care Guide   [email or web address]  ?? [phone]   Website: [email or web address]  [email or web address]

## 2022-09-27 ENCOUNTER — Encounter: Payer: Self-pay | Admitting: *Deleted

## 2022-09-29 DIAGNOSIS — Z6829 Body mass index (BMI) 29.0-29.9, adult: Secondary | ICD-10-CM | POA: Diagnosis not present

## 2022-09-29 DIAGNOSIS — G5603 Carpal tunnel syndrome, bilateral upper limbs: Secondary | ICD-10-CM | POA: Diagnosis not present

## 2022-09-29 DIAGNOSIS — I671 Cerebral aneurysm, nonruptured: Secondary | ICD-10-CM | POA: Diagnosis not present

## 2022-10-09 ENCOUNTER — Telehealth: Payer: Self-pay | Admitting: Family Medicine

## 2022-10-09 NOTE — Telephone Encounter (Signed)
Patient is requesting refill on diclofenac sodium 75 mg sent to [facility].

## 2022-10-10 ENCOUNTER — Other Ambulatory Visit: Payer: Self-pay | Admitting: Family Medicine

## 2022-10-10 MED ORDER — DICLOFENAC SODIUM 75 MG PO TBEC: DELAYED_RELEASE_TABLET | ORAL | 0 refills | Status: DC

## 2022-10-10 NOTE — Telephone Encounter (Signed)
A refill was sent in use sparingly please keep follow-up visit in March as planned

## 2022-10-10 NOTE — Telephone Encounter (Signed)
Called and left message for patient to call office.

## 2022-10-20 ENCOUNTER — Other Ambulatory Visit: Payer: Self-pay | Admitting: Family Medicine

## 2022-10-24 NOTE — Telephone Encounter (Signed)
Left message to return call.  

## 2022-10-27 ENCOUNTER — Ambulatory Visit: Payer: Medicare Other | Admitting: Family Medicine

## 2022-11-02 NOTE — Telephone Encounter (Signed)
Contacted pharmacy and pt picked up med on 10/10/22

## 2022-11-06 ENCOUNTER — Encounter: Payer: Self-pay | Admitting: Family Medicine

## 2022-11-06 ENCOUNTER — Telehealth: Payer: Self-pay | Admitting: Family Medicine

## 2022-11-06 DIAGNOSIS — Z79899 Other long term (current) drug therapy: Secondary | ICD-10-CM

## 2022-11-06 DIAGNOSIS — E785 Hyperlipidemia, unspecified: Secondary | ICD-10-CM

## 2022-11-06 DIAGNOSIS — I10 Essential (primary) hypertension: Secondary | ICD-10-CM

## 2022-11-06 NOTE — Telephone Encounter (Signed)
Blood work ordered in EPIC. Patient notified. 

## 2022-11-06 NOTE — Telephone Encounter (Signed)
Nurses Patient has follow-up office visit in March It is best for her to do a metabolic 7, lipid, liver before that visit along with a urine ACR Let her know she will be doing a urine to check for protein this is a test we do periodically for individuals with high blood pressure She has an appointment on March 20 she should try to get the lab work done before then thank you (Diagnosis hypertension, hyperlipidemia)

## 2022-11-06 NOTE — Progress Notes (Signed)
Please mail to the patient

## 2022-11-09 ENCOUNTER — Other Ambulatory Visit: Payer: Self-pay | Admitting: *Deleted

## 2022-11-09 MED ORDER — FLUTICASONE FUROATE-VILANTEROL 200-25 MCG/ACT IN AEPB: INHALATION_SPRAY | RESPIRATORY_TRACT | 0 refills | Status: DC

## 2022-11-13 DIAGNOSIS — Z79899 Other long term (current) drug therapy: Secondary | ICD-10-CM | POA: Diagnosis not present

## 2022-11-13 DIAGNOSIS — E785 Hyperlipidemia, unspecified: Secondary | ICD-10-CM | POA: Diagnosis not present

## 2022-11-13 DIAGNOSIS — I10 Essential (primary) hypertension: Secondary | ICD-10-CM | POA: Diagnosis not present

## 2022-11-14 ENCOUNTER — Encounter: Payer: Self-pay | Admitting: Family Medicine

## 2022-11-14 LAB — MICROALBUMIN / CREATININE URINE RATIO
Creatinine, Urine: 128.3 mg/dL
Microalb/Creat Ratio: 7 mg/g creat (ref 0–29)
Microalbumin, Urine: 8.7 ug/mL

## 2022-11-14 LAB — LIPID PANEL
Chol/HDL Ratio: 3.7 ratio (ref 0.0–4.4)
Cholesterol, Total: 161 mg/dL (ref 100–199)
HDL: 44 mg/dL (ref >39)
LDL Chol Calc (NIH): 103 mg/dL — ABNORMAL HIGH (ref 0–99)
Triglycerides: 71 mg/dL (ref 0–149)
VLDL Cholesterol Cal: 14 mg/dL (ref 5–40)

## 2022-11-14 LAB — BASIC METABOLIC PANEL
BUN/Creatinine Ratio: 14 (ref 12–28)
BUN: 10 mg/dL (ref 8–27)
CO2: 26 mmol/L (ref 20–29)
Calcium: 10.3 mg/dL (ref 8.7–10.3)
Chloride: 102 mmol/L (ref 96–106)
Creatinine, Ser: 0.71 mg/dL (ref 0.57–1.00)
Glucose: 97 mg/dL (ref 70–99)
Potassium: 4.5 mmol/L (ref 3.5–5.2)
Sodium: 140 mmol/L (ref 134–144)
eGFR: 86 mL/min/{1.73_m2} (ref >59)

## 2022-11-14 LAB — HEPATIC FUNCTION PANEL
ALT: 15 IU/L (ref 0–32)
AST: 22 IU/L (ref 0–40)
Albumin: 4.5 g/dL (ref 3.8–4.8)
Alkaline Phosphatase: 100 IU/L (ref 44–121)
Bilirubin Total: 0.6 mg/dL (ref 0.0–1.2)
Bilirubin, Direct: 0.18 mg/dL (ref 0.00–0.40)
Total Protein: 7.2 g/dL (ref 6.0–8.5)

## 2022-11-14 NOTE — Progress Notes (Signed)
Please mail to the patient

## 2022-11-29 ENCOUNTER — Encounter: Payer: Self-pay | Admitting: Family Medicine

## 2022-11-29 ENCOUNTER — Ambulatory Visit (INDEPENDENT_AMBULATORY_CARE_PROVIDER_SITE_OTHER): Payer: Medicare Other | Admitting: Family Medicine

## 2022-11-29 VITALS — BP 170/88 | Ht 66.0 in | Wt 177.4 lb

## 2022-11-29 DIAGNOSIS — E785 Hyperlipidemia, unspecified: Secondary | ICD-10-CM | POA: Diagnosis not present

## 2022-11-29 DIAGNOSIS — M25532 Pain in left wrist: Secondary | ICD-10-CM

## 2022-11-29 DIAGNOSIS — M542 Cervicalgia: Secondary | ICD-10-CM

## 2022-11-29 DIAGNOSIS — I10 Essential (primary) hypertension: Secondary | ICD-10-CM

## 2022-11-29 DIAGNOSIS — M792 Neuralgia and neuritis, unspecified: Secondary | ICD-10-CM

## 2022-11-29 MED ORDER — CLONIDINE HCL 0.2 MG PO TABS: 0.2000 mg | ORAL_TABLET | Freq: Two times a day (BID) | ORAL | 3 refills | Status: DC

## 2022-11-29 NOTE — Progress Notes (Signed)
   Subjective:    Patient ID: Melissa Barajas, female    DOB: 08-21-1941, 82 y.o.   MRN: [id number]  Hypertension This is a chronic problem. The current episode started more than 1 year ago. Risk factors for coronary artery disease include dyslipidemia and post-menopausal state. Treatments tried: clonidine, norvasc.   Patient states her head has been hurting since October and her right hand has been giving her a fit  Referral was sent to [facility] but she has not heard from them She relates pain in the right portion of her neck near the base of the skull hurts with certain movements to some degree it hurts all the time she describes as a aching pain she has had a previous MRI which showed aneurysm that neurosurgeon is planning on doing an MRA toward the end of this year but this does not explain her cervical pain  Cervical pain - Plan: DG Cervical Spine Complete  Arthralgia of left wrist - Plan: Uric Acid, Sedimentation Rate, C-reactive protein, Rheumatoid factor, Ambulatory referral to Orthopedic Surgery  Essential hypertension  Hyperlipidemia, unspecified hyperlipidemia type  Nerve pain - Plan: Ambulatory referral to Neurology   Neck right side  Left hand swelling and ache could not close Review of Systems     Objective:   Physical Exam General-in no acute distress Eyes-no discharge Lungs-respiratory rate normal, CTA CV-no murmurs,RRR Extremities skin warm dry no edema Neuro grossly normal Behavior normal, alert        Assessment & Plan:  1. Cervical pain Constant cervical pain over the past few months more so on the right occipital region.  Does not radiate up into the scalp.  There is possibility this could be arthritis versus a pinched nerve recommend x-ray await results - DG Cervical Spine Complete  2. Arthralgia of left wrist She had swelling and arthralgia of the wrist and hand a few weeks ago that lasted for approximately a week and a half difficulty gripping  moving and twisting and because of this relates possibility of underlying issue including rheumatoid arthritis.  She states she was diagnosed by rheumatologist years ago with rheumatoid arthritis but never had any progression which seems odd for that diagnosis.  Therefore run additional labs we will go from there currently today there is no swelling difficulty of movement. - Uric Acid - Sedimentation Rate - C-reactive protein - Rheumatoid factor - Ambulatory referral to Orthopedic Surgery  3. Essential hypertension Blood pressure elevated above what it should be rechecked multiple times with did encourage healthy eating minimizing salt continuing her medication Increase dose of clonidine to 0.2 mg twice daily Hold off on diuretics patient previously had renal impairment with diuretics May need further workup of renal artery stenosis and other issues if ongoing blood pressure issues Lisinopril and ARB not indicated because of previous angioedema  4. Hyperlipidemia, unspecified hyperlipidemia type Healthy diet continue medication 3 days a week  5. Nerve pain Significant pain in her occiput region on the right side may benefit from Botox injections patient has used NSAIDs in the past but because of her hypertension I do not want to use NSAIDs on a regular basis - Ambulatory referral to Neurology  Patient also with numbness in the right hand mainly thumb and finger.  Bothering him on a regular basis.  Will need to have nerve conduction study and evaluation by orthopedics We had previously referred to [facility] but the patient never heard anything from them.  We will try this again.

## 2022-11-30 ENCOUNTER — Ambulatory Visit (HOSPITAL_COMMUNITY)
Admission: RE | Admit: 2022-11-30 | Discharge: 2022-11-30 | Disposition: A | Discharge status: Home / Self Care | Payer: Medicare Other | Source: Ambulatory Visit | Attending: Family Medicine | Admitting: Family Medicine

## 2022-11-30 DIAGNOSIS — M542 Cervicalgia: Secondary | ICD-10-CM | POA: Diagnosis not present

## 2022-11-30 DIAGNOSIS — M25532 Pain in left wrist: Secondary | ICD-10-CM | POA: Diagnosis not present

## 2022-12-01 ENCOUNTER — Encounter: Payer: Self-pay | Admitting: Family Medicine

## 2022-12-01 LAB — SEDIMENTATION RATE: Sed Rate: 34 mm/hr (ref 0–40)

## 2022-12-01 LAB — URIC ACID: Uric Acid: 4.6 mg/dL (ref 3.1–7.9)

## 2022-12-01 LAB — RHEUMATOID FACTOR: Rheumatoid fact SerPl-aCnc: <10 IU/mL (ref <14.0)

## 2022-12-01 LAB — C-REACTIVE PROTEIN: CRP: 4 mg/L (ref 0–10)

## 2022-12-01 NOTE — Progress Notes (Signed)
Please mail to patient I do not think she utilizes [facility]

## 2022-12-04 ENCOUNTER — Other Ambulatory Visit: Payer: Self-pay | Admitting: Family Medicine

## 2022-12-05 ENCOUNTER — Telehealth: Payer: Self-pay | Admitting: Family Medicine

## 2022-12-05 MED ORDER — DICLOFENAC SODIUM 75 MG PO TBEC: DELAYED_RELEASE_TABLET | ORAL | 2 refills | Status: DC

## 2022-12-05 NOTE — Telephone Encounter (Signed)
Refill on   diclofenac (VOLTAREN) 75 MG EC tablet  Stating she only has one left send to [address]

## 2022-12-05 NOTE — Telephone Encounter (Signed)
3 refills 

## 2022-12-05 NOTE — Telephone Encounter (Signed)
Prescription sent electronically to pharmacy. Patient notified. 

## 2022-12-11 DIAGNOSIS — M79642 Pain in left hand: Secondary | ICD-10-CM | POA: Diagnosis not present

## 2022-12-11 DIAGNOSIS — M13842 Other specified arthritis, left hand: Secondary | ICD-10-CM | POA: Diagnosis not present

## 2022-12-17 ENCOUNTER — Telehealth: Payer: Self-pay | Admitting: Family Medicine

## 2022-12-17 NOTE — Telephone Encounter (Signed)
Nurses We did refer her to [facility]. Please do a follow-up phone call with Melissa Barajas please make sure that [facility] did connect with her to set her up for a follow-up visit thank you If not please have [name] Re contact [facility] and inform the patient you are having them do so thank you thank you

## 2022-12-18 NOTE — Telephone Encounter (Signed)
Message sent to referral coordinator

## 2022-12-20 DIAGNOSIS — M79642 Pain in left hand: Secondary | ICD-10-CM | POA: Diagnosis not present

## 2022-12-22 DIAGNOSIS — M1120 Other chondrocalcinosis, unspecified site: Secondary | ICD-10-CM | POA: Diagnosis not present

## 2022-12-22 DIAGNOSIS — M79641 Pain in right hand: Secondary | ICD-10-CM | POA: Diagnosis not present

## 2022-12-22 DIAGNOSIS — M79642 Pain in left hand: Secondary | ICD-10-CM | POA: Diagnosis not present

## 2022-12-27 ENCOUNTER — Ambulatory Visit: Payer: Medicare Other | Admitting: Family Medicine

## 2022-12-29 NOTE — Telephone Encounter (Signed)
Patient stated she saw the doctor at [facility] last week and they will send the note to Dr Lorin Picket

## 2023-01-16 ENCOUNTER — Other Ambulatory Visit: Payer: Self-pay | Admitting: Family Medicine

## 2023-01-17 ENCOUNTER — Ambulatory Visit (INDEPENDENT_AMBULATORY_CARE_PROVIDER_SITE_OTHER): Payer: Medicare Other | Admitting: Family Medicine

## 2023-01-17 VITALS — BP 150/77 | HR 65 | Ht 66.0 in | Wt 179.6 lb

## 2023-01-17 DIAGNOSIS — I10 Essential (primary) hypertension: Secondary | ICD-10-CM | POA: Diagnosis not present

## 2023-01-17 DIAGNOSIS — M25551 Pain in right hip: Secondary | ICD-10-CM

## 2023-01-17 MED ORDER — OXYCODONE-ACETAMINOPHEN 5-325 MG PO TABS: ORAL_TABLET | ORAL | 0 refills | Status: DC

## 2023-01-17 MED ORDER — PREDNISONE 20 MG PO TABS: ORAL_TABLET | ORAL | 0 refills | Status: DC

## 2023-01-17 MED ORDER — CLONIDINE HCL 0.2 MG PO TABS: 0.2000 mg | ORAL_TABLET | Freq: Two times a day (BID) | ORAL | 3 refills | Status: DC

## 2023-01-17 NOTE — Progress Notes (Unsigned)
   Subjective:    Patient ID: Melissa Barajas, female    DOB: [birth year], 82 y.o.   MRN: [id number]  HPI Patient arrives today for 4 week check up.  Here today for follow-up She states she is having severe right hip pain She relates a fair amount of discomfort with movement.  She is walking with a cane She denies any previous troubles This kicked in in the past few days She has had previous what appears to be the possibility of a inflammation in her hands which could well been a connective tissue disease she also states that for some reason the pharmacy gave her clonidine 0.1 even that we sent in 0.2 according to the electronic send does appear, I did Patient states her right groin is hurting started Sunday.    Review of Systems     Objective:   Physical Exam General-in no acute distress Eyes-no discharge Lungs-respiratory rate normal, CTA CV-no murmurs,RRR Extremities skin warm dry no edema Neuro grossly normal Behavior normal, alert Increased pain right groin with internal rotation external rotation and raising it       Assessment & Plan:  Hip x-ray May need referral to orthopedics Prednisone taper Percocet for severe pain half tablet at a time as needed caution drowsiness Patient to do a follow-up in the next several weeks to recheck blood pressure correct dose of Catapres was sent in it was sent in previously not quite sure what the problem was Certainly if inflammatory markers are elevated may need rheumatology

## 2023-01-18 ENCOUNTER — Ambulatory Visit (HOSPITAL_COMMUNITY)
Admission: RE | Admit: 2023-01-18 | Discharge: 2023-01-18 | Disposition: A | Discharge status: Home / Self Care | Payer: Medicare Other | Source: Ambulatory Visit | Attending: Family Medicine | Admitting: Family Medicine

## 2023-01-18 DIAGNOSIS — M25551 Pain in right hip: Secondary | ICD-10-CM | POA: Diagnosis not present

## 2023-01-18 LAB — CBC WITH DIFFERENTIAL/PLATELET
Basophils Absolute: 0.1 10*3/uL (ref 0.0–0.2)
Basos: 1 %
EOS (ABSOLUTE): 0.3 10*3/uL (ref 0.0–0.4)
Eos: 4 %
Hematocrit: 43.2 % (ref 34.0–46.6)
Hemoglobin: 14.3 g/dL (ref 11.1–15.9)
Immature Grans (Abs): 0 10*3/uL (ref 0.0–0.1)
Immature Granulocytes: 0 %
Lymphocytes Absolute: 2.6 10*3/uL (ref 0.7–3.1)
Lymphs: 32 %
MCH: 31.5 pg (ref 26.6–33.0)
MCHC: 33.1 g/dL (ref 31.5–35.7)
MCV: 95 fL (ref 79–97)
Monocytes Absolute: 0.9 10*3/uL (ref 0.1–0.9)
Monocytes: 11 %
Neutrophils Absolute: 4.2 10*3/uL (ref 1.4–7.0)
Neutrophils: 52 %
Platelets: 234 10*3/uL (ref 150–450)
RBC: 4.54 x10E6/uL (ref 3.77–5.28)
RDW: 13.3 % (ref 11.7–15.4)
WBC: 8.1 10*3/uL (ref 3.4–10.8)

## 2023-01-18 LAB — SEDIMENTATION RATE: Sed Rate: 60 mm/hr — ABNORMAL HIGH (ref 0–40)

## 2023-01-18 LAB — C-REACTIVE PROTEIN: CRP: 13 mg/L — ABNORMAL HIGH (ref 0–10)

## 2023-01-25 ENCOUNTER — Telehealth: Payer: Self-pay

## 2023-01-25 NOTE — Telephone Encounter (Signed)
Pt is calling back for results off her x-ray done 05/08  [phone] [facility]

## 2023-01-26 ENCOUNTER — Ambulatory Visit (INDEPENDENT_AMBULATORY_CARE_PROVIDER_SITE_OTHER): Payer: Medicare Other

## 2023-01-26 ENCOUNTER — Telehealth: Payer: Self-pay | Admitting: Family Medicine

## 2023-01-26 ENCOUNTER — Other Ambulatory Visit: Payer: Self-pay | Admitting: Family Medicine

## 2023-01-26 VITALS — BP 150/77 | Ht 66.0 in | Wt 179.0 lb

## 2023-01-26 DIAGNOSIS — Z1231 Encounter for screening mammogram for malignant neoplasm of breast: Secondary | ICD-10-CM

## 2023-01-26 DIAGNOSIS — Z780 Asymptomatic menopausal state: Secondary | ICD-10-CM

## 2023-01-26 DIAGNOSIS — Z0000 Encounter for general adult medical examination without abnormal findings: Secondary | ICD-10-CM

## 2023-01-26 MED ORDER — OSELTAMIVIR PHOSPHATE 75 MG PO CAPS: 75.0000 mg | ORAL_CAPSULE | Freq: Two times a day (BID) | ORAL | 0 refills | Status: DC

## 2023-01-26 NOTE — Telephone Encounter (Signed)
FYI I did communicate with the patient regarding the results of her test We will repeat sed rate and CRP She has a follow-up visit next Friday on the 24th She also has flulike illness that started yesterday with sore throat congestion and a slight cough no wheezing or shortness of breath some achiness but no high fevers Given that she is coming off her prednisone taper we will go ahead and send in Tamiflu 1 twice daily for 5 days if it causes nausea and vomiting stop the medicine Give [place] feedback how things are going If not significantly improved by Monday we will want to work her and otherwise we will see her next week she states understanding of these issues

## 2023-01-26 NOTE — Patient Instructions (Signed)
Melissa Barajas , Thank you for taking time to come for your Medicare Wellness Visit. I appreciate your ongoing commitment to your health goals. Please review the following plan we discussed and let me know if I can assist you in the future.   These are the goals we discussed:  Goals      DIET - REDUCE SUGAR INTAKE     Prevent falls        This is a list of the screening recommended for you and due dates:  Health Maintenance  Topic Date Due   DTaP/Tdap/Td vaccine (1 - Tdap) Never done   Zoster (Shingles) Vaccine (1 of 2) Never done   COVID-19 Vaccine (4 - 2023-24 season) 05/12/2022   Flu Shot  04/12/2023   Medicare Annual Wellness Visit  01/26/2024   Pneumonia Vaccine  Completed   DEXA scan (bone density measurement)  Completed   Hepatitis C Screening: USPSTF Recommendation to screen - Ages 21-79 yo.  Completed   HPV Vaccine  Aged Out   Colon Cancer Screening  Discontinued    Advanced directives: Advance directive discussed with you today. I have provided a copy for you to complete at home and have notarized. Once this is complete please bring a copy in to our office so we can scan it into your chart. These will be mailed to you.   Conditions/risks identified: A bone density scan and mammogram have been ordered for you today. They will call you with the appointment information  Next appointment: Follow up in one year for your annual wellness visit Feb 01, 2024 @ 10am via telephone  Preventive Care 65 Years and Older, Female Preventive care refers to lifestyle choices and visits with your health care provider that can promote health and wellness. What does preventive care include? A yearly physical exam. This is also called an annual well check. Dental exams once or twice a year. Routine eye exams. Ask your health care provider how often you should have your eyes checked. Personal lifestyle choices, including: Daily care of your teeth and gums. Regular physical activity. Eating a  healthy diet. Avoiding tobacco and drug use. Limiting alcohol use. Practicing safe sex. Taking low-dose aspirin every day. Taking vitamin and mineral supplements as recommended by your health care provider. What happens during an annual well check? The services and screenings done by your health care provider during your annual well check will depend on your age, overall health, lifestyle risk factors, and family history of disease. Counseling  Your health care provider may ask you questions about your: Alcohol use. Tobacco use. Drug use. Emotional well-being. Home and relationship well-being. Sexual activity. Eating habits. History of falls. Memory and ability to understand (cognition). Work and work Astronomer. Reproductive health. Screening  You may have the following tests or measurements: Height, weight, and BMI. Blood pressure. Lipid and cholesterol levels. These may be checked every 5 years, or more frequently if you are over 76 years old. Skin check. Lung cancer screening. You may have this screening every year starting at age 2 if you have a 30-pack-year history of smoking and currently smoke or have quit within the past 15 years. Fecal occult blood test (FOBT) of the stool. You may have this test every year starting at age 78. Flexible sigmoidoscopy or colonoscopy. You may have a sigmoidoscopy every 5 years or a colonoscopy every 10 years starting at age 55. Hepatitis C blood test. Hepatitis B blood test. Sexually transmitted disease (STD) testing. Diabetes screening. This  is done by checking your blood sugar (glucose) after you have not eaten for a while (fasting). You may have this done every 1-3 years. Bone density scan. This is done to screen for osteoporosis. You may have this done starting at age 96. Mammogram. This may be done every 1-2 years. Talk to your health care provider about how often you should have regular mammograms. Talk with your health care  provider about your test results, treatment options, and if necessary, the need for more tests. Vaccines  Your health care provider may recommend certain vaccines, such as: Influenza vaccine. This is recommended every year. Tetanus, diphtheria, and acellular pertussis (Tdap, Td) vaccine. You may need a Td booster every 10 years. Zoster vaccine. You may need this after age 35. Pneumococcal 13-valent conjugate (PCV13) vaccine. One dose is recommended after age 93. Pneumococcal polysaccharide (PPSV23) vaccine. One dose is recommended after age 43. Talk to your health care provider about which screenings and vaccines you need and how often you need them. This information is not intended to replace advice given to you by your health care provider. Make sure you discuss any questions you have with your health care provider. Document Released: 09/24/2015 Document Revised: 05/17/2016 Document Reviewed: 06/29/2015 Elsevier Interactive Patient Education  2017 [employer].  Fall Prevention in the Home Falls can cause injuries. They can happen to people of all ages. There are many things you can do to make your home safe and to help prevent falls. What can I do on the outside of my home? Regularly fix the edges of walkways and driveways and fix any cracks. Remove anything that might make you trip as you walk through a door, such as a raised step or threshold. Trim any bushes or trees on the path to your home. Use bright outdoor lighting. Clear any walking paths of anything that might make someone trip, such as rocks or tools. Regularly check to see if handrails are loose or broken. Make sure that both sides of any steps have handrails. Any raised decks and porches should have guardrails on the edges. Have any leaves, snow, or ice cleared regularly. Use sand or salt on walking paths during winter. Clean up any spills in your garage right away. This includes oil or grease spills. What can I do in the  bathroom? Use night lights. Install grab bars by the toilet and in the tub and shower. Do not use towel bars as grab bars. Use non-skid mats or decals in the tub or shower. If you need to sit down in the shower, use a plastic, non-slip stool. Keep the floor dry. Clean up any water that spills on the floor as soon as it happens. Remove soap buildup in the tub or shower regularly. Attach bath mats securely with double-sided non-slip rug tape. Do not have throw rugs and other things on the floor that can make you trip. What can I do in the bedroom? Use night lights. Make sure that you have a light by your bed that is easy to reach. Do not use any sheets or blankets that are too big for your bed. They should not hang down onto the floor. Have a firm chair that has side arms. You can use this for support while you get dressed. Do not have throw rugs and other things on the floor that can make you trip. What can I do in the kitchen? Clean up any spills right away. Avoid walking on wet floors. Keep items that  you use a lot in easy-to-reach places. If you need to reach something above you, use a strong step stool that has a grab bar. Keep electrical cords out of the way. Do not use floor polish or wax that makes floors slippery. If you must use wax, use non-skid floor wax. Do not have throw rugs and other things on the floor that can make you trip. What can I do with my stairs? Do not leave any items on the stairs. Make sure that there are handrails on both sides of the stairs and use them. Fix handrails that are broken or loose. Make sure that handrails are as long as the stairways. Check any carpeting to make sure that it is firmly attached to the stairs. Fix any carpet that is loose or worn. Avoid having throw rugs at the top or bottom of the stairs. If you do have throw rugs, attach them to the floor with carpet tape. Make sure that you have a light switch at the top of the stairs and the  bottom of the stairs. If you do not have them, ask someone to add them for you. What else can I do to help prevent falls? Wear shoes that: Do not have high heels. Have rubber bottoms. Are comfortable and fit you well. Are closed at the toe. Do not wear sandals. If you use a stepladder: Make sure that it is fully opened. Do not climb a closed stepladder. Make sure that both sides of the stepladder are locked into place. Ask someone to hold it for you, if possible. Clearly mark and make sure that you can see: Any grab bars or handrails. First and last steps. Where the edge of each step is. Use tools that help you move around (mobility aids) if they are needed. These include: Canes. Walkers. Scooters. Crutches. Turn on the lights when you go into a dark area. Replace any light bulbs as soon as they burn out. Set up your furniture so you have a clear path. Avoid moving your furniture around. If any of your floors are uneven, fix them. If there are any pets around you, be aware of where they are. Review your medicines with your doctor. Some medicines can make you feel dizzy. This can increase your chance of falling. Ask your doctor what other things that you can do to help prevent falls. This information is not intended to replace advice given to you by your health care provider. Make sure you discuss any questions you have with your health care provider. Document Released: 06/24/2009 Document Revised: 02/03/2016 Document Reviewed: 10/02/2014 Elsevier Interactive Patient Education  2017 [employer] Managing Pain Without Opioids Opioids are strong medicines used to treat moderate to severe pain. For some people, especially those who have long-term (chronic) pain, opioids may not be the best choice for pain management due to: Side effects like nausea, constipation, and sleepiness. The risk of addiction (opioid use disorder). The longer you take opioids, the greater your risk of  addiction. Pain that lasts for more than 3 months is called chronic pain. Managing chronic pain usually requires more than one approach and is often provided by a team of health care providers working together (multidisciplinary approach). Pain management may be done at a pain management center or pain clinic. How to manage pain without the use of opioids Use non-opioid medicines Non-opioid medicines for pain may include: Over-the-counter or prescription non-steroidal anti-inflammatory drugs (NSAIDs). These may be the first medicines used for pain. They  work well for muscle and bone pain, and they reduce swelling. Acetaminophen. This over-the-counter medicine may work well for milder pain but not swelling. Antidepressants. These may be used to treat chronic pain. A certain type of antidepressant (tricyclics) is often used. These medicines are given in lower doses for pain than when used for depression. Anticonvulsants. These are usually used to treat seizures but may also reduce nerve (neuropathic) pain. Muscle relaxants. These relieve pain caused by sudden muscle tightening (spasms). You may also use a pain medicine that is applied to the skin as a patch, cream, or gel (topical analgesic), such as a numbing medicine. These may cause fewer side effects than medicines taken by mouth. Do certain therapies as directed Some therapies can help with pain management. They include: Physical therapy. You will do exercises to gain strength and flexibility. A physical therapist may teach you exercises to move and stretch parts of your body that are weak, stiff, or painful. You can learn these exercises at physical therapy visits and practice them at home. Physical therapy may also involve: Massage. Heat wraps or applying heat or cold to affected areas. Electrical signals that interrupt pain signals (transcutaneous electrical nerve stimulation, TENS). Weak lasers that reduce pain and swelling (low-level laser  therapy). Signals from your body that help you learn to regulate pain (biofeedback). Occupational therapy. This helps you to learn ways to function at home and work with less pain. Recreational therapy. This involves trying new activities or hobbies, such as a physical activity or drawing. Mental health therapy, including: Cognitive behavioral therapy (CBT). This helps you learn coping skills for dealing with pain. Acceptance and commitment therapy (ACT) to change the way you think and react to pain. Relaxation therapies, including muscle relaxation exercises and mindfulness-based stress reduction. Pain management counseling. This may be individual, family, or group counseling.  Receive medical treatments Medical treatments for pain management include: Nerve block injections. These may include a pain blocker and anti-inflammatory medicines. You may have injections: Near the spine to relieve chronic back or neck pain. Into joints to relieve back or joint pain. Into nerve areas that supply a painful area to relieve body pain. Into muscles (trigger point injections) to relieve some painful muscle conditions. A medical device placed near your spine to help block pain signals and relieve nerve pain or chronic back pain (spinal cord stimulation device). Acupuncture. Follow these instructions at home Medicines Take over-the-counter and prescription medicines only as told by your health care provider. If you are taking pain medicine, ask your health care providers about possible side effects to watch out for. Do not drive or use heavy machinery while taking prescription opioid pain medicine. Lifestyle  Do not use drugs or alcohol to reduce pain. If you drink alcohol, limit how much you have to: 0-1 drink a day for women who are not pregnant. 0-2 drinks a day for men. Know how much alcohol is in a drink. In the [place] one drink equals one 12 oz bottle of beer (355 mL), one 5 oz glass of wine (148  mL), or one 1 oz glass of hard liquor (44 mL). Do not use any products that contain nicotine or tobacco. These products include cigarettes, chewing tobacco, and vaping devices, such as e-cigarettes. If you need help quitting, ask your health care provider. Eat a healthy diet and maintain a healthy weight. Poor diet and excess weight may make pain worse. Eat foods that are high in fiber. These include fresh fruits and vegetables,  whole grains, and beans. Limit foods that are high in fat and processed sugars, such as fried and sweet foods. Exercise regularly. Exercise lowers stress and may help relieve pain. Ask your health care provider what activities and exercises are safe for you. If your health care provider approves, join an exercise class that combines movement and stress reduction. Examples include yoga and tai chi. Get enough sleep. Lack of sleep may make pain worse. Lower stress as much as possible. Practice stress reduction techniques as told by your therapist. General instructions Work with all your pain management providers to find the treatments that work best for you. You are an important member of your pain management team. There are many things you can do to reduce pain on your own. Consider joining an online or in-person support group for people who have chronic pain. Keep all follow-up visits. This is important. Where to find more information You can find more information about managing pain without opioids from: American Academy of Pain Medicine: painmed.org Institute for Chronic Pain: instituteforchronicpain.org American Chronic Pain Association: theacpa.org Contact a health care provider if: You have side effects from pain medicine. Your pain gets worse or does not get better with treatments or home therapy. You are struggling with anxiety or depression. Summary Many types of pain can be managed without opioids. Chronic pain may respond better to pain management without  opioids. Pain is best managed when you and a team of health care providers work together. Pain management without opioids may include non-opioid medicines, medical treatments, physical therapy, mental health therapy, and lifestyle changes. Tell your health care providers if your pain gets worse or is not being managed well enough. This information is not intended to replace advice given to you by your health care provider. Make sure you discuss any questions you have with your health care provider. Document Revised: 12/08/2020 Document Reviewed: 12/08/2020 Elsevier Patient Education  2023 [employer].

## 2023-01-26 NOTE — Telephone Encounter (Signed)
See Dr's telephone note.

## 2023-01-26 NOTE — Progress Notes (Addendum)
I connected with  Melissa Barajas on 01/26/23 by a audio enabled telemedicine application and verified that I am speaking with the correct person using two identifiers.  Patient Location: Home  Provider Location: Home Office  I discussed the limitations of evaluation and management by telemedicine. The patient expressed understanding and agreed to proceed.  Subjective:   Melissa Barajas is a 82 y.o. female who presents for Medicare Annual (Subsequent) preventive examination.  Review of Systems     Cardiac Risk Factors include: advanced age (>5men, >61 women);hypertension;sedentary lifestyle     Objective:    Today's Vitals   01/26/23 0931  BP: (!) 150/77  Weight: 179 lb (81.2 kg)  Height: 5\' 6"  (1.676 m)  PainSc: 0-No pain   Body mass index is 28.89 kg/m.     01/26/2023    9:41 AM 09/12/2022    1:33 PM 01/17/2022    9:56 AM 08/08/2021   10:51 AM 01/11/2021    9:53 AM 04/05/2020    4:50 PM 04/05/2020    2:08 PM  Advanced Directives  Does Patient Have a Medical Advance Directive? No No No No No No No  Would patient like information on creating a medical advance directive? Yes (MAU/Ambulatory/Procedural Areas - Information given) No - Patient declined No - Patient declined No - Patient declined No - Patient declined No - Patient declined Yes (MAU/Ambulatory/Procedural Areas - Information given)    Current Medications (verified) Outpatient Encounter Medications as of 01/26/2023  Medication Sig   amLODipine (NORVASC) 10 MG tablet TAKE 1 TABLET(10 MG) BY MOUTH DAILY   Ascorbic Acid (VITAMIN C) 1000 MG tablet Take 1,000 mg by mouth daily.   Cholecalciferol (VITAMIN D3) 50 MCG (2000 UT) TABS Take 2,000 Units by mouth daily.   cloNIDine (CATAPRES) 0.2 MG tablet Take 1 tablet (0.2 mg total) by mouth 2 (two) times daily.   etodolac (LODINE) 400 MG tablet Take 400 mg by mouth daily as needed (pain).   famotidine (PEPCID) 40 MG tablet Take 1 tablet (40 mg total) by mouth daily.    fluticasone furoate-vilanterol (BREO ELLIPTA) 200-25 MCG/ACT AEPB USE 1 INHALATION BY MOUTH  DAILY   oxyCODONE-acetaminophen (PERCOCET/ROXICET) 5-325 MG tablet 1/2 tablet to 1 tablet q 6 hours prn pain caution drowsiness   potassium chloride (KLOR-CON M) 10 MEQ tablet TAKE 1 TABLET BY MOUTH DAILY   rosuvastatin (CRESTOR) 20 MG tablet TAKE 1 TABLET BY MOUTH ON MONDAY AND 1 TABLET ON FRIDAY   Specialty Vitamins Products (BRAIN PO) Take 1 capsule by mouth daily. FOREBRAIN COGNITIVE PERFORMANCE SUPPLEMENT   diclofenac (VOLTAREN) 75 MG EC tablet 1 twice daily as needed (Patient not taking: Reported on 01/26/2023)   Phenazopyridine HCl (AZO TABS PO) Take by mouth. (Patient not taking: Reported on 01/26/2023)   Polyethyl Glycol-Propyl Glycol (SYSTANE OP) Place 2 drops into both eyes 2 (two) times daily.  (Patient not taking: Reported on 01/26/2023)   [DISCONTINUED] predniSONE (DELTASONE) 20 MG tablet 3 pills today then 2 daily for 3 days then 1 a day for 3 days (Patient not taking: Reported on 01/26/2023)   No facility-administered encounter medications on file as of 01/26/2023.    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 screening colonoscopy in 10 years 2. She should follow a high fiber diet She has been given a hand out on high fiber diverticulosois and hemorrhoids   COLONOSCOPY N/A 02/07/2018   Procedure: COLONOSCOPY;  Surgeon: West Bali, MD;  [facility];  Service: Endoscopy;  Laterality: N/A;  1:00pm   CYSTECTOMY     Left breast   ESOPHAGOGASTRODUODENOSCOPY     YNW:[id number] appearing Schatzki's ring, possible cervical esophageal web, both dilated with passage of a Maloney dilator/small hiatal hernia   ESOPHAGOGASTRODUODENOSCOPY N/A 10/25/2015   Procedure: ESOPHAGOGASTRODUODENOSCOPY (EGD);  Surgeon: West Bali, MD;  [facility];  Service: Endoscopy;  Laterality: N/A;  1215pm   ESOPHAGOGASTRODUODENOSCOPY N/A 10/24/2019   Schatzki's ring s/p dilation and single medium firm, probable submucosal nodule without bleeding. Medium sized hiatal hernia, normal duodenum.    ESOPHAGOGASTRODUODENOSCOPY N/A 04/05/2020   10 mm leiomyoma and capsule study without obvious lesion or bleeding.   EUS  11/2019   Dr. Dulce Sellar: EUS completed March 2021 with likely leiomyoma, no other concerns. 10 mm by 10 mm.    GIVENS CAPSULE STUDY N/A 04/06/2020   Procedure: GIVENS CAPSULE STUDY;  Surgeon: Malissa Hippo, MD;  [facility];  Service: Endoscopy;  Laterality: N/A;   POLYPECTOMY  02/07/2018   Procedure: POLYPECTOMY;  Surgeon: West Bali, MD;  [facility];  Service: Endoscopy;;  ascending colon, descending, sigmoid   SAVORY DILATION N/A 10/25/2015   Procedure: SAVORY DILATION;  Surgeon: West Bali, MD;  [facility];  Service: Endoscopy;  Laterality: N/A;   SAVORY DILATION N/A 10/24/2019   Procedure: SAVORY DILATION;  Surgeon: West Bali, MD;  [facility];  Service: Endoscopy;  Laterality: N/A;   THYROIDECTOMY     Tumor   05/2010   Left breast under nipple   VESICOVAGINAL FISTULA CLOSURE W/ TAH     fibroid  tumors   Family History  Problem Relation Age of Onset   Cancer Brother        Prostate   Social History   Socioeconomic History   Marital status: Widowed    Spouse name: Not on file   Number of children: Not on file   Years of education: Not on file   Highest education level: Not on file  Occupational History   Not on file  Tobacco Use   Smoking status: Former    Packs/day: 1.00    Years: 15.00    Additional pack years: 0.00    Total pack years: 15.00    Types: Cigarettes    Start date: 09/12/1983    Quit date: 09/11/1993    Years since quitting: 29.3   Smokeless tobacco: Never  Vaping Use   Vaping Use: Never used  Substance and Sexual Activity   Alcohol use: No    Alcohol/week: 0.0 standard  drinks of alcohol   Drug use: No   Sexual activity: Not Currently    Birth control/protection: Post-menopausal, Surgical  Other Topics Concern   Not on file  Social History Narrative   01/26/23: Patient is currently helping to care for her brother who has cancer.    Social Determinants of Health   Financial Resource Strain: Low Risk  (01/26/2023)   Overall Financial Resource Strain (CARDIA)    Difficulty of Paying Living Expenses: Not hard at all  Food Insecurity: No Food Insecurity (01/26/2023)   Hunger Vital Sign    Worried About Running Out of Food in the Last Year: Never true    Ran Out of Food in the Last Year: Never true  Transportation Needs: No Transportation Needs (01/26/2023)   PRAPARE - Administrator, Civil Service (Medical): No    Lack of Transportation (Non-Medical): No  Physical Activity: Inactive (01/26/2023)   Exercise Vital Sign    Days of Exercise per Week: 0 days    Minutes of Exercise per Session: 0 min  Stress: No Stress Concern Present (01/26/2023)   [employer] of Occupational Health - Occupational Stress Questionnaire    Feeling of Stress : Not at all  Social Connections: Socially Integrated (01/26/2023)   Social Connection and  Isolation Panel [NHANES]    Frequency of Communication with Friends and Family: More than three times a week    Frequency of Social Gatherings with Friends and Family: More than three times a week    Attends Religious Services: More than 4 times per year    Active Member of [employer] or Organizations: Yes    Attends Engineer, structural: More than 4 times per year    Marital Status: Married    Tobacco Counseling Counseling given: Yes   Clinical Intake:  Pre-visit preparation completed: Yes  Pain : No/denies pain Pain Score: 0-No pain     BMI - recorded: 28.89 Nutritional Status: BMI 25 -29 Overweight Nutritional Risks: None Diabetes: No  How often do you need to have someone help you when you read instructions, pamphlets, or other written materials from your doctor or pharmacy?: 1 - Never  Diabetic?NO  Interpreter Needed?: No  Information entered by :: Abby Ettel Albergo, CMA   Activities of Daily Living    01/26/2023    9:41 AM  In your present state of health, do you have any difficulty performing the following activities:  Hearing? 0  Vision? 0  Comment eyeglasses  Difficulty concentrating or making decisions? 0  Walking or climbing stairs? 0  Dressing or bathing? 0  Doing errands, shopping? 0  Preparing Food and eating ? N  Using the Toilet? N  In the past six months, have you accidently leaked urine? N  Do you have problems with loss of bowel control? N  Managing your Medications? N  Managing your Finances? N  Housekeeping or managing your Housekeeping? N    Patient Care Team: Babs Sciara, MD as PCP - General (Family Medicine) West Bali, MD (Inactive) as Attending Physician (Gastroenterology) Lanelle Bal, DO as Consulting Physician (Internal Medicine)  Indicate any recent Medical Services you may have received from other than [facility] providers in the past year (date may be approximate).     Assessment:   This is a routine wellness  examination for [place].  Hearing/Vision screen Hearing Screening - Comments:: Patient denies any hearing difficulties.   Vision Screening - Comments:: Patient wears eyeglasses and is UTD with  yearly eye exam.    Dietary issues and exercise activities discussed: Current Exercise Habits: The patient does not participate in regular exercise at present, Exercise limited by: cardiac condition(s);orthopedic condition(s);Other - see comments (helping to care for sick brother)   Goals Addressed             This Visit's Progress    Patient Stated       Patient states her goal is to exercise/walk more.         Depression Screen    01/26/2023    9:39 AM 01/17/2023    3:20 PM 11/29/2022   10:24 AM 08/01/2022   11:10 AM 04/18/2022   10:58 AM 01/17/2022    9:54 AM 12/15/2021   10:09 AM  PHQ 2/9 Scores  PHQ - 2 Score 0 0 0 0 0 0 0  PHQ- 9 Score 0 0         Fall Risk    01/26/2023    9:41 AM 01/17/2023    3:20 PM 11/29/2022   10:24 AM 08/01/2022   11:10 AM 04/18/2022   10:58 AM  Fall Risk   Falls in the past year? 0 0 0 0 0  Number falls in past yr: 0 0  0 0  Injury with Fall? 0 0  0 0  Risk for fall due to : No Fall Risks  No Fall Risks No Fall Risks No Fall Risks  Follow up Falls prevention discussed  Falls evaluation completed Falls evaluation completed Falls evaluation completed    FALL RISK PREVENTION PERTAINING TO THE HOME:  Any stairs in or around the home? No  If so, are there any without handrails? No  Home free of loose throw rugs in walkways, pet beds, electrical cords, etc? Yes  Adequate lighting in your home to reduce risk of falls? Yes   ASSISTIVE DEVICES UTILIZED TO PREVENT FALLS:  Life alert? No  Use of a cane, walker or w/c? No  Grab bars in the bathroom? No  Shower chair or bench in shower? No  Elevated toilet seat or a handicapped toilet? No   TIMED UP AND GO:  Was the test performed? No . Telephone visit  Cognitive Function:      10/30/2017   12:00 PM   Montreal Cognitive Assessment   Visuospatial/ Executive (0/5) 3  Naming (0/3) 3  Attention: Read list of digits (0/2) 2  Attention: Read list of letters (0/1) 1  Attention: Serial 7 subtraction starting at 100 (0/3) 0  Language: Repeat phrase (0/2) 2  Language : Fluency (0/1) 1  Abstraction (0/2) 2  Delayed Recall (0/5) 3  Orientation (0/6) 6  Total 23  Adjusted Score (based on education) 24      01/26/2023    9:42 AM 01/17/2022   10:00 AM 01/11/2021    9:59 AM  6CIT Screen  What Year? 0 points 0 points 0 points  What month? 0 points 0 points 0 points  What time? 0 points 0 points 0 points  Count back from 20 0 points 0 points 0 points  Months in reverse 0 points 0 points 0 points  Repeat phrase 0 points 0 points 0 points  Total Score 0 points 0 points 0 points    Immunizations Immunization History  Administered Date(s) Administered   Fluad Quad(high Dose 65+) 06/29/2021, 05/26/2022   Influenza Split 07/08/2013   Influenza, High Dose Seasonal PF 06/16/2019   Influenza,inj,Quad PF,6+ Mos 05/25/2014, 06/15/2015, 06/22/2016, 06/19/2017, 06/21/2018  Influenza,inj,quad, With Preservative 06/11/2017   Influenza-Unspecified 05/13/2011, 06/25/2020   PFIZER(Purple Top)SARS-COV-2 Vaccination 10/01/2019, 10/22/2019, 06/10/2020   Pneumococcal Conjugate-13 10/12/2014   Pneumococcal Polysaccharide-23 07/01/2008   Zoster, Live 07/01/2008    TDAP status: Due, Education has been provided regarding the importance of this vaccine. Advised may receive this vaccine at local pharmacy or Health Dept. Aware to provide a copy of the vaccination record if obtained from local pharmacy or Health Dept. Verbalized acceptance and understanding.  Flu Vaccine status: Up to date  Pneumococcal vaccine status: Up to date  Covid-19 vaccine status: Completed vaccines  Qualifies for Shingles Vaccine? Yes   Zostavax completed No   Shingrix Completed?: No.    Education has been provided regarding the  importance of this vaccine. Patient has been advised to call insurance company to determine out of pocket expense if they have not yet received this vaccine. Advised may also receive vaccine at local pharmacy or Health Dept. Verbalized acceptance and understanding.  Screening Tests Health Maintenance  Topic Date Due   DTaP/Tdap/Td (1 - Tdap) Never done   Zoster Vaccines- Shingrix (1 of 2) Never done   COVID-19 Vaccine (4 - 2023-24 season) 05/12/2022   INFLUENZA VACCINE  04/12/2023   Medicare Annual Wellness (AWV)  01/26/2024   Pneumonia Vaccine 24+ Years old  Completed   DEXA SCAN  Completed   Hepatitis C Screening  Completed   HPV VACCINES  Aged Out   COLONOSCOPY (Pts 45-57yrs Insurance coverage will need to be confirmed)  Discontinued    Health Maintenance  Health Maintenance Due  Topic Date Due   DTaP/Tdap/Td (1 - Tdap) Never done   Zoster Vaccines- Shingrix (1 of 2) Never done   COVID-19 Vaccine (4 - 2023-24 season) 05/12/2022    Colorectal cancer screening: No longer required.   Mammogram status: Ordered 01/26/23. Pt provided with contact info and advised to call to schedule appt.   Bone Density status: Ordered 01/26/2023. Pt provided with contact info and advised to call to schedule appt.  Lung Cancer Screening: (Low Dose CT Chest recommended if Age 26-80 years, 30 pack-year currently smoking OR have quit w/in 15years.) does not qualify.    Additional Screening:  Hepatitis C Screening: does qualify; Completed 06/24/2021  Vision Screening: Recommended annual ophthalmology exams for early detection of glaucoma and other disorders of the eye. Is the patient up to date with their annual eye exam?  Yes  Who is the provider or what is the name of the office in which the patient attends annual eye exams? Dr.Cotter w/ [facility] If pt is not established with a provider, would they like to be referred to a provider to establish care? N/A  Dental Screening: Recommended annual  dental exams for proper oral hygiene  Community Resource Referral / Chronic Care Management: CRR required this visit?  No   CCM required this visit?  No      Plan:     I have personally reviewed and noted the following in the patient's chart:   Medical and social history Use of alcohol, tobacco or illicit drugs  Current medications and supplements including opioid prescriptions. Patient is currently taking opioid prescriptions. Information provided to patient regarding non-opioid alternatives. Patient advised to discuss non-opioid treatment plan with their provider. Functional ability and status Nutritional status Physical activity Advanced directives List of other physicians Hospitalizations, surgeries, and ER visits in previous 12 months Vitals Screenings to include cognitive, depression, and falls Referrals and appointments  In addition, I  have reviewed and discussed with patient certain preventive protocols, quality metrics, and best practice recommendations. A written personalized care plan for preventive services as well as general preventive health recommendations were provided to patient.   Due to this being a telephonic visit, the after visit summary with patients personalized plan was offered to patient via mail or my-chart. Patient would like to access their AVS via my-chart    Jordan Hawks Meridian Scherger, CMA   01/26/2023   Nurse Notes:

## 2023-01-30 ENCOUNTER — Ambulatory Visit: Payer: Medicare Other | Admitting: Neurology

## 2023-02-02 ENCOUNTER — Encounter: Payer: Self-pay | Admitting: Family Medicine

## 2023-02-02 ENCOUNTER — Ambulatory Visit (INDEPENDENT_AMBULATORY_CARE_PROVIDER_SITE_OTHER): Payer: Medicare Other | Admitting: Family Medicine

## 2023-02-02 VITALS — BP 117/73 | HR 76 | Temp 97.2°F | Ht 66.0 in | Wt 178.0 lb

## 2023-02-02 DIAGNOSIS — J189 Pneumonia, unspecified organism: Secondary | ICD-10-CM

## 2023-02-02 DIAGNOSIS — J989 Respiratory disorder, unspecified: Secondary | ICD-10-CM

## 2023-02-02 DIAGNOSIS — M25551 Pain in right hip: Secondary | ICD-10-CM | POA: Diagnosis not present

## 2023-02-02 MED ORDER — AZITHROMYCIN 250 MG PO TABS: ORAL_TABLET | ORAL | 0 refills | Status: AC

## 2023-02-02 MED ORDER — AMOXICILLIN-POT CLAVULANATE 875-125 MG PO TABS: 1.0000 | ORAL_TABLET | Freq: Two times a day (BID) | ORAL | 0 refills | Status: DC

## 2023-02-02 NOTE — Progress Notes (Unsigned)
   Subjective:    Patient ID: Melissa Barajas, female    DOB: [birth year], 82 y.o.   MRN: [id number]  HPI Right groin pain - uses cane to ambulate  Follow up Flu like symptoms- nasal congestion, cough - took tamiflu  Sed rate , crp labs repeat   Review of Systems     Objective:   Physical Exam Crackles noted in the bases no respiratory distress O2 sat is good rest of physical exam normal subjective discomfort right hip       Assessment & Plan:  Hip pain Will reach out to rheumatology Given her elevated CRP and relatively benign looking x-ray I believe she has underlying rheumatologic issue COVID test taken  Probable case of COVID recommend rest the next several days Double antibiotics to cover for the possibility of pneumonia  Warning signs regarding when to go to ER discussed There is no sign of respiratory distress I do not feel she needs x-rays or lab work currently We will recheck her next week  The nurses will be calling the patient to get the name of the rheumatologist so we can connect with them regarding her hip

## 2023-02-02 NOTE — Patient Instructions (Signed)
Hi Vasudha  It was good to see you today, sorry you are sick  When I listen to your lungs I do hear some congestion in the bases of your lungs.  This could be a early pneumonia as a result of your recent illness.  I recommend that you take 2 antibiotics Augmentin 875 mg 1 twice daily with a snack and a glass of water twice a day for 7 days Zithromax-2 pills today then 1 pill a day for Saturday Sunday Monday and Tuesday  I did send the lab a test for COVID we will let you know the results  Your oxygen level is doing well  Should you get dramatically worse such as difficulty breathing inability to keep things down it would be important to go to the ER otherwise you should gradually get better  We will recheck you later next week  Sooner if any problems-please take care-Dr. [name]

## 2023-02-05 ENCOUNTER — Telehealth: Payer: Self-pay | Admitting: Family Medicine

## 2023-02-05 NOTE — Telephone Encounter (Signed)
Nurses Please connect with Melissa Barajas She does not utilize MyChart on a regular basis so please call her Please find out the name of the rheumatologist she saw her recently regarding her wrist I would like to talk with that rheumatologist regarding her hip I cannot find that information within her electronic chart Thank you

## 2023-02-06 LAB — NOVEL CORONAVIRUS, NAA: SARS-CoV-2, NAA: NOT DETECTED

## 2023-02-06 LAB — SPECIMEN STATUS REPORT

## 2023-02-06 NOTE — Telephone Encounter (Signed)
Spoke with patient to inform her per drs request, she states she will find it and call us back with that information.

## 2023-02-06 NOTE — Telephone Encounter (Signed)
[facility] Rheumatology pt does not know the Drs name but was a female

## 2023-02-06 NOTE — Telephone Encounter (Signed)
Pt found the n=Dr Name it is Corky Crafts PA-C

## 2023-02-07 ENCOUNTER — Ambulatory Visit (HOSPITAL_COMMUNITY)
Admission: RE | Admit: 2023-02-07 | Discharge: 2023-02-07 | Disposition: A | Discharge status: Home / Self Care | Payer: Medicare Other | Source: Ambulatory Visit | Attending: Family Medicine | Admitting: Family Medicine

## 2023-02-07 ENCOUNTER — Ambulatory Visit (INDEPENDENT_AMBULATORY_CARE_PROVIDER_SITE_OTHER): Payer: Medicare Other | Admitting: Family Medicine

## 2023-02-07 ENCOUNTER — Encounter: Payer: Self-pay | Admitting: Family Medicine

## 2023-02-07 VITALS — BP 126/72 | HR 74 | Wt 181.4 lb

## 2023-02-07 DIAGNOSIS — M1990 Unspecified osteoarthritis, unspecified site: Secondary | ICD-10-CM

## 2023-02-07 DIAGNOSIS — M25551 Pain in right hip: Secondary | ICD-10-CM | POA: Diagnosis not present

## 2023-02-07 DIAGNOSIS — R0609 Other forms of dyspnea: Secondary | ICD-10-CM | POA: Insufficient documentation

## 2023-02-07 DIAGNOSIS — J189 Pneumonia, unspecified organism: Secondary | ICD-10-CM

## 2023-02-07 DIAGNOSIS — L659 Nonscarring hair loss, unspecified: Secondary | ICD-10-CM

## 2023-02-07 NOTE — Progress Notes (Signed)
   Subjective:    Patient ID: Melissa Barajas, female    DOB: 07/29/1941, 82 y.o.   MRN: [id number]  HPI Patient arrives today for follow up for pneumonia. Patient states she still has cough but her chest is not sore anymore.  Relates ongoing cough congestion but states she is feeling much better than what she was she still does not have her sense of smell or taste that started to come back her COVID test was negative but certainly her symptomatology points toward possible COVID  She was treated for pneumonia but she does states she gets out of breath with activity this is been since being sick  She also states her right hip is improving prednisone helped she related severe sharp pains she was barely able to walk when she was having this trouble her CRP was elevated but now she is doing much better her x-ray shows degenerative changes but not enough to account for this  Review of Systems     Objective:   Physical Exam General-in no acute distress Eyes-no discharge Lungs-respiratory rate normal, CTA CV-no murmurs,RRR Extremities skin warm dry no edema Neuro grossly normal Behavior normal, alert Still has lingering head congestion but no respiratory distress O2 sats are good       Assessment & Plan:  1. Hair loss She relates hair loss we will check lab work await the results of this If thyroid is normal might consider minoxidil she is going to tell us what OTC measures she was using for her scalp - TSH + free T4 - CBC with Differential - C-reactive protein  2. Community acquired pneumonia, unspecified laterality She is doing much better no need for further antibiotics chest x-ray ordered - DG Chest 2 View  3. Pain of right hip This pain is doing significantly better.  I called rheumatology office and spoke with the nurse of PA-Michelle Lalama We will send our documentation down there this patient does not have a follow-up till October I believe she needs to be seen  sooner Hopefully they can come up with a game plan that she can utilize when she gets these flareups  4. 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

## 2023-02-07 NOTE — Progress Notes (Signed)
Front-please send the last couple visits that have to do with her hip pain as well as recent lab work that includes sed rate and CRP as well as x-ray with this letter to [facility] rheumatology-thank you-Dr. Lorin Picket

## 2023-02-08 ENCOUNTER — Telehealth: Payer: Self-pay | Admitting: Family Medicine

## 2023-02-08 LAB — CBC WITH DIFFERENTIAL/PLATELET

## 2023-02-08 LAB — C-REACTIVE PROTEIN: CRP: 9 mg/L (ref 0–10)

## 2023-02-08 NOTE — Telephone Encounter (Signed)
Nurses Please let the patient know that her chest x-ray did not show pneumonia (please see result note) No sign of fluid in the lungs  Please do a follow-up visit in approximately 6 weeks Follow-up sooner if any problems  Please let the patient know that I did connect with her rheumatology group and asked them to reach out to her to set up a follow-up visit somewhere in the near future hopefully this summer.  They did not have her down until October.  But they stated they should be able to move that appointment up  If we hear anything additional from them we will let her know

## 2023-02-09 LAB — CBC WITH DIFFERENTIAL/PLATELET
Basophils Absolute: 0.1 10*3/uL (ref 0.0–0.2)
Basos: 1 %
EOS (ABSOLUTE): 0.4 10*3/uL (ref 0.0–0.4)
Eos: 5 %
Hemoglobin: 13 g/dL (ref 11.1–15.9)
Immature Grans (Abs): 0 10*3/uL (ref 0.0–0.1)
Immature Granulocytes: 0 %
MCHC: 33.3 g/dL (ref 31.5–35.7)
MCV: 94 fL (ref 79–97)
Monocytes Absolute: 0.8 10*3/uL (ref 0.1–0.9)
Neutrophils Absolute: 4.1 10*3/uL (ref 1.4–7.0)
Neutrophils: 52 %
RBC: 4.14 x10E6/uL (ref 3.77–5.28)

## 2023-02-09 LAB — TSH+FREE T4
Free T4: 1.18 ng/dL (ref 0.82–1.77)
TSH: 0.479 u[IU]/mL (ref 0.450–4.500)

## 2023-02-09 NOTE — Telephone Encounter (Signed)
Patient advised per Dr Scott:  Nurses Please let the patient know that her chest x-ray did not show pneumonia (please see result note) No sign of fluid in the lungs   Please do a follow-up visit in approximately 6 weeks Follow-up sooner if any problems   Please let the patient know that I did connect with her rheumatology group and asked them to reach out to her to set up a follow-up visit somewhere in the near future hopefully this summer.  They did not have her down until October.  But they stated they should be able to move that appointment up   If we hear anything additional from them we will let her know    Patient verbalized understanding. 

## 2023-02-09 NOTE — Telephone Encounter (Signed)
Please see her result notes Please let her know I did communicate with rheumatology they are reviewing her information more than likely they will work her in sooner if she has further troubles.  We are awaiting more information from the rheumatology doctor she saw

## 2023-02-09 NOTE — Telephone Encounter (Signed)
Called and left message for patient to call office (for chest xray results and patient calls)

## 2023-02-09 NOTE — Telephone Encounter (Signed)
Patient advised per Dr Lorin Picket:  Nurses Please let the patient know that her chest x-ray did not show pneumonia (please see result note) No sign of fluid in the lungs   Please do a follow-up visit in approximately 6 weeks Follow-up sooner if any problems   Please let the patient know that I did connect with her rheumatology group and asked them to reach out to her to set up a follow-up visit somewhere in the near future hopefully this summer.  They did not have her down until October.  But they stated they should be able to move that appointment up   If we hear anything additional from them we will let her know    Patient verbalized understanding.

## 2023-02-11 ENCOUNTER — Encounter (HOSPITAL_COMMUNITY): Payer: Self-pay | Admitting: Emergency Medicine

## 2023-02-11 ENCOUNTER — Other Ambulatory Visit: Payer: Self-pay

## 2023-02-11 ENCOUNTER — Emergency Department (HOSPITAL_COMMUNITY)
Admission: EM | Admit: 2023-02-11 | Discharge: 2023-02-11 | Disposition: A | Discharge status: Home / Self Care | Payer: Medicare Other | Attending: Student | Admitting: Student

## 2023-02-11 ENCOUNTER — Emergency Department (HOSPITAL_COMMUNITY): Payer: Medicare Other

## 2023-02-11 DIAGNOSIS — I10 Essential (primary) hypertension: Secondary | ICD-10-CM | POA: Insufficient documentation

## 2023-02-11 DIAGNOSIS — Z79899 Other long term (current) drug therapy: Secondary | ICD-10-CM | POA: Insufficient documentation

## 2023-02-11 DIAGNOSIS — M545 Low back pain, unspecified: Secondary | ICD-10-CM | POA: Insufficient documentation

## 2023-02-11 DIAGNOSIS — R109 Unspecified abdominal pain: Secondary | ICD-10-CM | POA: Diagnosis not present

## 2023-02-11 DIAGNOSIS — R059 Cough, unspecified: Secondary | ICD-10-CM | POA: Diagnosis not present

## 2023-02-11 DIAGNOSIS — R079 Chest pain, unspecified: Secondary | ICD-10-CM | POA: Diagnosis not present

## 2023-02-11 DIAGNOSIS — J9811 Atelectasis: Secondary | ICD-10-CM | POA: Diagnosis not present

## 2023-02-11 LAB — CBC
HCT: 40.1 % (ref 36.0–46.0)
Hemoglobin: 13.3 g/dL (ref 12.0–15.0)
MCH: 31.9 pg (ref 26.0–34.0)
MCHC: 33.2 g/dL (ref 30.0–36.0)
MCV: 96.2 fL (ref 80.0–100.0)
Platelets: 279 10*3/uL (ref 150–400)
RBC: 4.17 MIL/uL (ref 3.87–5.11)
RDW: 14.1 % (ref 11.5–15.5)
WBC: 7.5 10*3/uL (ref 4.0–10.5)
nRBC: 0 % (ref 0.0–0.2)

## 2023-02-11 LAB — BASIC METABOLIC PANEL
Anion gap: 9 (ref 5–15)
BUN: 8 mg/dL (ref 8–23)
CO2: 27 mmol/L (ref 22–32)
Calcium: 9.5 mg/dL (ref 8.9–10.3)
Chloride: 101 mmol/L (ref 98–111)
Creatinine, Ser: 0.65 mg/dL (ref 0.44–1.00)
GFR, Estimated: >60 mL/min (ref >60)
Glucose, Bld: 111 mg/dL — ABNORMAL HIGH (ref 70–99)
Potassium: 3.7 mmol/L (ref 3.5–5.1)
Sodium: 137 mmol/L (ref 135–145)

## 2023-02-11 LAB — URINALYSIS, ROUTINE W REFLEX MICROSCOPIC
Bilirubin Urine: NEGATIVE
Glucose, UA: NEGATIVE mg/dL
Hgb urine dipstick: NEGATIVE
Ketones, ur: NEGATIVE mg/dL
Leukocytes,Ua: NEGATIVE
Nitrite: NEGATIVE
Protein, ur: NEGATIVE mg/dL
Specific Gravity, Urine: 1.009 (ref 1.005–1.030)
pH: 5 (ref 5.0–8.0)

## 2023-02-11 NOTE — ED Provider Notes (Signed)
[place] EMERGENCY DEPARTMENT AT [facility] Provider Note   CSN: [id number] Arrival date & time: 02/11/23  1320     History  Chief Complaint  Patient presents with   Flank Pain    Melissa Barajas is a 82 y.o. female.  With a history of hypertension, A-fib, hypercholesterolemia who presents to the ED for evaluation of bilateral flank pain.  States this began yesterday morning shortly after she woke up.  It is described as a soreness.  Currently rates the pain at an 8 out of 10.  It encompasses her flank and entire low back.  She was recently seen and treated for pneumonia.  States her cough is improved and currently not present.  She denies any shortness of breath or chest pain.  No abdominal pain, nausea, fevers, urinary symptoms.   Flank Pain       Home Medications Prior to Admission medications   Medication Sig Start Date End Date Taking? Authorizing Provider  amLODipine (NORVASC) 10 MG tablet TAKE 1 TABLET(10 MG) BY MOUTH DAILY Patient taking differently: Take 10 mg by mouth daily. 10/20/22  Yes Babs Sciara, MD  Ascorbic Acid (VITAMIN C) 1000 MG tablet Take 1,000 mg by mouth daily.   Yes [provider]  Cholecalciferol (VITAMIN D3) 50 MCG (2000 UT) TABS Take 2,000 Units by mouth daily.   Yes [provider]  cloNIDine (CATAPRES) 0.2 MG tablet Take 1 tablet (0.2 mg total) by mouth 2 (two) times daily. 01/17/23  Yes Babs Sciara, MD  diclofenac (VOLTAREN) 75 MG EC tablet 1 twice daily as needed 12/05/22  Yes Luking, Jonna Coup, MD  famotidine (PEPCID) 40 MG tablet Take 1 tablet (40 mg total) by mouth daily. 11/04/21  Yes Luking, Jonna Coup, MD  fluticasone furoate-vilanterol (BREO ELLIPTA) 200-25 MCG/ACT AEPB USE 1 INHALATION BY MOUTH  DAILY 11/09/22  Yes Luking, Jonna Coup, MD  Phenazopyridine HCl (AZO TABS PO) Take by mouth.   Yes [provider]  Polyethyl Glycol-Propyl Glycol (SYSTANE OP) Place 2 drops into both eyes 2 (two) times daily.   Yes  [provider]  potassium chloride (KLOR-CON M) 10 MEQ tablet TAKE 1 TABLET BY MOUTH DAILY 01/22/23  Yes Luking, Scott A, MD  rosuvastatin (CRESTOR) 20 MG tablet TAKE 1 TABLET BY MOUTH ON MONDAY AND 1 TABLET ON FRIDAY 11/04/21  Yes Luking, Scott A, MD  amoxicillin-clavulanate (AUGMENTIN) 875-125 MG tablet Take 1 tablet by mouth 2 (two) times daily. Patient not taking: Reported on 02/11/2023 02/02/23   Babs Sciara, MD      Allergies    Aricept Palma Holter hcl], Hydrocodone, Lisinopril, Naprosyn [naproxen], Oxycodone, and Rofecoxib    Review of Systems   Review of Systems  Genitourinary:  Positive for flank pain.  All other systems reviewed and are negative.   Physical Exam Updated Vital Signs BP (!) 140/71 (BP Location: Left Arm)   Pulse 85   Temp 97.6 F (36.4 C) (Oral)   Resp 18   Ht 5\' 6"  (1.676 m)   Wt 82.3 kg   SpO2 99%   BMI 29.29 kg/m  Physical Exam Vitals and nursing note reviewed.  Constitutional:      General: She is not in acute distress.    Appearance: Normal appearance. She is well-developed. She is not ill-appearing, toxic-appearing or diaphoretic.     Comments: Resting comfortably in bed  HENT:     Head: Normocephalic and atraumatic.  Eyes:     Conjunctiva/sclera: Conjunctivae  normal.  Cardiovascular:     Rate and Rhythm: Normal rate and regular rhythm.     Heart sounds: No murmur heard. Pulmonary:     Effort: Pulmonary effort is normal. No respiratory distress.     Breath sounds: Normal breath sounds. No wheezing, rhonchi or rales.  Abdominal:     Palpations: Abdomen is soft.     Tenderness: There is no abdominal tenderness. There is no guarding.  Musculoskeletal:        General: No swelling.     Cervical back: Neck supple.     Comments: Mild tenderness to bilateral flank and low back  Skin:    General: Skin is warm and dry.     Capillary Refill: Capillary refill takes less than 2 seconds.  Neurological:     General: No focal deficit  present.     Mental Status: She is alert and oriented to person, place, and time.  Psychiatric:        Mood and Affect: Mood normal.        Behavior: Behavior normal.     ED Results / Procedures / Treatments   Labs (all labs ordered are listed, but only abnormal results are displayed) Labs Reviewed  BASIC METABOLIC PANEL - Abnormal; Notable for the following components:      Result Value   Glucose, Bld 111 (*)    All other components within normal limits  URINALYSIS, ROUTINE W REFLEX MICROSCOPIC  CBC    EKG None  Radiology DG Chest 2 View  Result Date: 02/11/2023 CLINICAL DATA:  Cough.  Bilateral flank pain.  Recent pneumonia. EXAM: CHEST - 2 VIEW COMPARISON:  02/07/2023 FINDINGS: The heart size and mediastinal contours are within normal limits. Surgical clips again seen at the thoracic inlet. Increased linear opacity at left lung base is consistent with mild subsegmental atelectasis. No evidence of pulmonary airspace disease or edema. No evidence of pleural effusion. IMPRESSION: Mild left basilar subsegmental atelectasis. Electronically Signed   By: Danae Orleans M.D.   On: 02/11/2023 14:09    Procedures Procedures    Medications Ordered in ED Medications - No data to display  ED Course/ Medical Decision Making/ A&P                             Medical Decision Making Amount and/or Complexity of Data Reviewed Labs: ordered. Radiology: ordered.  This patient presents to the ED for concern of flank pain, this involves an extensive number of treatment options, and is a complaint that carries with it a high risk of complications and morbidity.  The differential diagnosis of emergent flank pain includes, but is not limited to :Abdominal aortic aneurysm,, Renal artery embolism,Renal vein thrombosis, Aortic dissection, Mesenteric ischemia, Pyelonephritis, Renal infarction, Renal hemorrhage, Nephrolithiasis/ Renal Colic, Bladder tumor,Cystitis, Biliary colic,  Pancreatitis Perforated peptic ulcer Appendicitis ,Inguinal Hernia, Diverticulitis, Bowel obstruction Ectopic Pregnancy,PID/TOA,Ovarian cyst, Ovarian torsion Shingles Lower lobe pneumonia, Retroperitoneal hematoma/abscess/tumor, Epidural abscess, Epidural hematoma   Co morbidities that complicate the patient evaluation  hypertension, A-fib, hypercholesterolemia  My initial workup includes labs, imaging  Additional history obtained from: Nursing notes from this visit. Previous records within EMR system office visit on 02/02/2023 for evaluation of possible pneumonia Family daughter is at bedside and provides a portion of the history  I ordered, reviewed and interpreted labs which include: CBC, BMP, urinalysis.  Hyperglycemia 111.  Labs otherwise normal  I ordered imaging studies including chest x ray I  independently visualized and interpreted imaging which showed no significant change since 02/07/2023 I agree with the radiologist interpretation  Afebrile, hemodynamically stable.  82 year old female presents ED for evaluation of bilateral flank and low back pain.  She was recently treated for pneumonia.  She has been coughing.  She states the pain began yesterday.  Has been better since that time.  Her cough is also gotten better.  She appears very well on physical exam.  She has some mild tenderness to bilateral flanks and low back.  She is nontachypneic and nonhypoxic.  No respiratory distress.  No adventitious breath sounds.  No urinary symptoms.  Lab workup is reassuring.  Chest x-ray shows no significant changes.  Overall suspect her soreness is secondary to her coughing.  She declined pain medication in the ED.  She presents today stating that she "just wants to make sure my lungs are okay."  She was encouraged to follow-up with her primary care provider within the next week if her symptoms do not continue to improve.  She was given return precautions.  Stable at discharge.  At this time there  does not appear to be any evidence of an acute emergency medical condition and the patient appears stable for discharge with appropriate outpatient follow up. Diagnosis was discussed with patient who verbalizes understanding of care plan and is agreeable to discharge. I have discussed return precautions with patient and daughter who verbalizes understanding. Patient encouraged to follow-up with their PCP within 1 week. All questions answered.  Patient's case discussed with Dr. Posey Rea who agrees with plan to discharge with follow-up.   Note: Portions of this report may have been transcribed using voice recognition software. Every effort was made to ensure accuracy; however, inadvertent computerized transcription errors may still be present.        Final Clinical Impression(s) / ED Diagnoses Final diagnoses:  Flank pain    Rx / DC Orders ED Discharge Orders     None         Mora Bellman 02/11/23 1508    Glendora Score, MD 02/11/23 1754

## 2023-02-11 NOTE — ED Triage Notes (Signed)
Pt reporting bilateral flank pain since yesterday; denies urinary symptoms. Pt was recently treated for pneumonia and has a lingering cough.

## 2023-02-11 NOTE — Discharge Instructions (Signed)
You have been seen today for your complaint of flank pain. Your lab work  was reassuring and showed no abnormalities. Your imaging was reassuring and showed no abnormalities. Home care instructions are as follows:  Alternate heat and ice perform gentle stretching exercises daily for your pain Follow up with: Your primary care provider within the next week if your symptoms do not improve Please seek immediate medical care if you develop any of the following symptoms: You have trouble breathing or you are short of breath. Your abdomen hurts or it is swollen or red. You have nausea or vomiting. You feel faint, or you faint. You have blood in your urine. You have flank pain and a fever. At this time there does not appear to be the presence of an emergent medical condition, however there is always the potential for conditions to change. Please read and follow the below instructions.  Do not take your medicine if  develop an itchy rash, swelling in your mouth or lips, or difficulty breathing; call 911 and seek immediate emergency medical attention if this occurs.  You may review your lab tests and imaging results in their entirety on your MyChart account.  Please discuss all results of fully with your primary care provider and other specialist at your follow-up visit.  Note: Portions of this text may have been transcribed using voice recognition software. Every effort was made to ensure accuracy; however, inadvertent computerized transcription errors may still be present.

## 2023-02-12 ENCOUNTER — Other Ambulatory Visit: Payer: Self-pay | Admitting: Family Medicine

## 2023-02-14 ENCOUNTER — Telehealth: Payer: Self-pay

## 2023-02-14 NOTE — Transitions of Care (Post Inpatient/ED Visit) (Signed)
02/14/2023  Name: Melissa Barajas MRN: [id number] DOB: [birth year]  Today's TOC FU Call Status: Today's TOC FU Call Status:: Successful TOC FU Call Competed TOC FU Call Complete Date: 02/14/23  Red on EMMI-ED Discharge Alert Date & Reason:02/13/23 "Scheduled follow-up appt? No"  Transition Care Management Follow-up Telephone Call Date of Discharge: 02/11/23 [facility] Type of Discharge: Emergency Department Reason for ED Visit: Other: ("flank pain") How have you been since you were released from the hospital?: Better (Pt pleased to report that 'pain just went away on its own" the next day after her ED visit. It has not returned and no further issues. She is eating good. LBM was yest. She reports cough has improved as well.) Any questions or concerns?: No  Items Reviewed: Did you receive and understand the discharge instructions provided?: Yes Medications obtained,verified, and reconciled?: Yes (Medications Reviewed) Any new allergies since your discharge?: No Dietary orders reviewed?: Yes Type of Diet Ordered:: low salt/heart healthy Do you have support at home?: Yes People in Home: alone  Medications Reviewed Today: Medications Reviewed Today     Reviewed by Charlyn Minerva, RN (Registered Nurse) on 02/14/23 at 1504  Med List Status: <None>   Medication Order Taking? Sig Documenting Provider Last Dose Status Informant  amLODipine (NORVASC) 10 MG tablet [id number] Yes TAKE 1 TABLET(10 MG) BY MOUTH DAILY  Patient taking differently: Take 10 mg by mouth daily.   Babs Sciara, MD Taking Active Self, Pharmacy Records  amoxicillin-clavulanate (AUGMENTIN) 875-125 MG tablet [id number] No Take 1 tablet by mouth 2 (two) times daily.  Patient not taking: Reported on 02/11/2023   Babs Sciara, MD Not Taking Active Self, Pharmacy Records  Ascorbic Acid (VITAMIN C) 1000 MG tablet [id number] Yes Take 1,000 mg by mouth daily. [provider] Taking  Active Self, Pharmacy Records  Cholecalciferol (VITAMIN D3) 50 MCG (2000 UT) TABS [id number] Yes Take 2,000 Units by mouth daily. [provider] Taking Active Self, Pharmacy Records  cloNIDine (CATAPRES) 0.2 MG tablet [id number] Yes Take 1 tablet (0.2 mg total) by mouth 2 (two) times daily. Babs Sciara, MD Taking Active Self, Pharmacy Records  diclofenac (VOLTAREN) 75 MG EC tablet [id number] Yes 1 twice daily as needed Babs Sciara, MD Taking Active Self, Pharmacy Records  famotidine (PEPCID) 40 MG tablet [id number] Yes TAKE 1 TABLET BY MOUTH DAILY GENERIC EQUIVALENT FOR PEPCID Luking, Jonna Coup, MD Taking Active   fluticasone furoate-vilanterol (BREO ELLIPTA) 200-25 MCG/ACT AEPB [id number] Yes USE 1 INHALATION BY MOUTH  DAILY Luking, Jonna Coup, MD Taking Active Self, Pharmacy Records  Phenazopyridine HCl (AZO TABS PO) [id number] Yes Take by mouth. [provider] Taking Active Self, Pharmacy Records  Polyethyl Glycol-Propyl Glycol [facility] OP) [id number] Yes Place 2 drops into both eyes 2 (two) times daily. [provider] Taking Active Self, Pharmacy Records           Med Note Peggye Pitt, [facility] M   Tue Aug 24, 2020  9:55 AM) Blink eye drops  potassium chloride (KLOR-CON M) 10 MEQ tablet [id number] Yes TAKE 1 TABLET BY MOUTH DAILY Luking, Jonna Coup, MD Taking Active Self, Pharmacy Records  rosuvastatin (CRESTOR) 20 MG tablet [id number] Yes TAKE 1 TABLET BY MOUTH ON MONDAY AND 1 TABLET ON FRIDAY Babs Sciara, MD Taking Active Self, Pharmacy Records            Home Care and Equipment/Supplies: Were Home Health Services Ordered?: NA Any new equipment or medical supplies ordered?:  NA  Functional Questionnaire: Do you need assistance with bathing/showering or dressing?: No Do you need assistance with meal preparation?: No Do you need assistance with eating?: No Do you have difficulty maintaining continence: No Do you need assistance with getting out of bed/getting out of  a chair/moving?: No Do you have difficulty managing or taking your medications?: No  Follow up appointments reviewed: PCP Follow-up appointment confirmed?: NA (pt states she was advised to follow up if sxs did not resolve/improve. She does not want to make an appt at this time-as she states she just saw PCP on 02/07/23) MD Provider Line [phone] Given: No Specialist Hospital Follow-up appointment confirmed?: NA Do you need transportation to your follow-up appointment?: No Do you understand care options if your condition(s) worsen?: Yes-patient verbalized understanding  SDOH Interventions Today    Flowsheet Row Most Recent Value  SDOH Interventions   Food Insecurity Interventions Intervention Not Indicated  Transportation Interventions Intervention Not Indicated      TOC Interventions Today    Flowsheet Row Most Recent Value  TOC Interventions   TOC Interventions Discussed/Reviewed TOC Interventions Discussed      Interventions Today    Flowsheet Row Most Recent Value  General Interventions   General Interventions Discussed/Reviewed General Interventions Discussed, Doctor Visits  Doctor Visits Discussed/Reviewed Doctor Visits Discussed, PCP  PCP/Specialist Visits Compliance with follow-up visit  Education Interventions   Education Provided Provided Education  Provided Verbal Education On Nutrition, When to see the doctor, Medication  Nutrition Interventions   Nutrition Discussed/Reviewed Nutrition Discussed  Pharmacy Interventions   Pharmacy Dicussed/Reviewed Pharmacy Topics Discussed, Medications and their functions  Safety Interventions   Safety Discussed/Reviewed Safety Discussed       Alessandra Grout [facility] Care Management Care Management Community Coordinator Direct Phone: [phone] Toll Free: [phone] Fax: [phone]

## 2023-03-13 ENCOUNTER — Other Ambulatory Visit (HOSPITAL_COMMUNITY): Payer: Self-pay | Admitting: Neurological Surgery

## 2023-03-13 DIAGNOSIS — I671 Cerebral aneurysm, nonruptured: Secondary | ICD-10-CM

## 2023-03-26 DIAGNOSIS — K080 Exfoliation of teeth due to systemic causes: Secondary | ICD-10-CM | POA: Diagnosis not present

## 2023-04-12 ENCOUNTER — Ambulatory Visit (HOSPITAL_COMMUNITY)
Admission: RE | Admit: 2023-04-12 | Discharge: 2023-04-12 | Disposition: A | Discharge status: Home / Self Care | Payer: Medicare Other | Source: Ambulatory Visit | Attending: Neurological Surgery | Admitting: Neurological Surgery

## 2023-04-12 DIAGNOSIS — R519 Headache, unspecified: Secondary | ICD-10-CM | POA: Diagnosis not present

## 2023-04-12 DIAGNOSIS — I671 Cerebral aneurysm, nonruptured: Secondary | ICD-10-CM | POA: Diagnosis not present

## 2023-04-18 DIAGNOSIS — I671 Cerebral aneurysm, nonruptured: Secondary | ICD-10-CM | POA: Diagnosis not present

## 2023-04-18 DIAGNOSIS — Z6828 Body mass index (BMI) 28.0-28.9, adult: Secondary | ICD-10-CM | POA: Diagnosis not present

## 2023-05-02 ENCOUNTER — Other Ambulatory Visit: Payer: Self-pay | Admitting: Family Medicine

## 2023-06-11 ENCOUNTER — Ambulatory Visit: Payer: Medicare Other | Admitting: Family Medicine

## 2023-06-11 ENCOUNTER — Ambulatory Visit (HOSPITAL_COMMUNITY)
Admission: RE | Admit: 2023-06-11 | Discharge: 2023-06-11 | Disposition: A | Discharge status: Home / Self Care | Payer: Medicare Other | Source: Ambulatory Visit | Attending: Family Medicine | Admitting: Family Medicine

## 2023-06-11 ENCOUNTER — Other Ambulatory Visit (HOSPITAL_COMMUNITY)
Admission: RE | Admit: 2023-06-11 | Discharge: 2023-06-11 | Disposition: A | Discharge status: Home / Self Care | Payer: Medicare Other | Source: Ambulatory Visit | Attending: Family Medicine | Admitting: Family Medicine

## 2023-06-11 VITALS — BP 133/74 | HR 66 | Temp 97.3°F | Ht 66.0 in | Wt 184.6 lb

## 2023-06-11 DIAGNOSIS — R600 Localized edema: Secondary | ICD-10-CM | POA: Diagnosis not present

## 2023-06-11 DIAGNOSIS — Z0189 Encounter for other specified special examinations: Secondary | ICD-10-CM | POA: Diagnosis not present

## 2023-06-11 DIAGNOSIS — R519 Headache, unspecified: Secondary | ICD-10-CM

## 2023-06-11 DIAGNOSIS — Z23 Encounter for immunization: Secondary | ICD-10-CM

## 2023-06-11 LAB — BASIC METABOLIC PANEL
Anion gap: 9 (ref 5–15)
BUN: 9 mg/dL (ref 8–23)
CO2: 25 mmol/L (ref 22–32)
Calcium: 9.2 mg/dL (ref 8.9–10.3)
Chloride: 102 mmol/L (ref 98–111)
Creatinine, Ser: 0.73 mg/dL (ref 0.44–1.00)
GFR, Estimated: >60 mL/min (ref >60)
Glucose, Bld: 110 mg/dL — ABNORMAL HIGH (ref 70–99)
Potassium: 3.9 mmol/L (ref 3.5–5.1)
Sodium: 136 mmol/L (ref 135–145)

## 2023-06-11 LAB — C-REACTIVE PROTEIN: CRP: 1.7 mg/dL — ABNORMAL HIGH (ref <1.0)

## 2023-06-11 LAB — D-DIMER, QUANTITATIVE: D-Dimer, Quant: 0.63 ug{FEU}/mL — ABNORMAL HIGH (ref 0.00–0.50)

## 2023-06-11 LAB — SEDIMENTATION RATE: Sed Rate: 18 mm/h (ref 0–22)

## 2023-06-11 NOTE — Progress Notes (Signed)
   Subjective:    Patient ID: Melissa Barajas, female    DOB: [birth year], 82 y.o.   MRN: [id number]  Discussed the use of AI scribe software for clinical note transcription with the patient, who gave verbal consent to proceed.  History of Present Illness   The patient, Melissa Barajas, with a known history of a cerebral aneurysm, presented with a persistent headache of three weeks duration. The pain was localized to the right temple, extending towards the eye and down to the ear. The headache was described as a constant ache, lasting for hours at a time, and was severe enough to wake the patient from sleep. Over-the-counter analgesics provided temporary relief. The patient denied any associated nausea, vomiting, or visual disturbances. There was no history of trauma or any precipitating event. The patient also reported a new onset of unilateral swelling in the right lower extremity, which had been present continuously for several days prior to the consultation. The swelling extended from the ankle to the foot and was not associated with pain or soreness. The patient denied any recent travel or prolonged periods of immobility. The patient's medical history is significant for a cerebral aneurysm, which was deemed stable at the last follow-up. The patient also reported a family history of cerebral aneurysms.     She denies pain in the lower leg just states some swelling that is unusual for her    Review of Systems     Objective:    Physical Exam   VITALS: BP- 134/68 HEENT: No tenderness upon palpation of the right temple extending to the eye and down to the ear. CHEST: Lungs clear to auscultation. CARDIOVASCULAR: Normal heart sounds. EXTREMITIES: Significant edema in the right ankle extending to the entire foot. No pain or soreness upon palpation.       There is swelling in the lower calf and lower leg on the right side which is very impressive    Assessment & Plan:  Assessment and Plan    Right-sided  Headache New onset, persistent for three weeks, located from the temple to the ear. No associated nausea, vomiting, or visual changes. No temporal tenderness. History of cerebral aneurysm, stable on last imaging in August. -Order ESR and CRP to evaluate for possible temporal arteritis. -Consider neurology consultation if inflammatory markers are elevated or if headache persists.  Unilateral Right Lower Extremity Edema New onset, persistent for several days. No associated pain or recent travel. No history of deep vein thrombosis. -Order D-dimer and lower extremity ultrasound to evaluate for possible deep vein thrombosis. -If no clot is identified, recommend trial of compression stockings.  Stable Cerebral Aneurysm Last imaging in August showed stable aneurysm. Patient is aware of the condition and has been advised to inform family members due to potential genetic predisposition. -No change in management at this time. Continue annual follow-up as recommended by neurosurgery.      Ultrasound did not show any blood clots Lab work did show elevated D-dimer Will need to repeat ultrasound in 1 week Knee-high surgical compression stockings recommended Kidney function look good Follow-up again in 3 to 4 weeks sooner if problems-I did try calling the patient with the results of this and was unable to get an answer on either phone-we will try calling again in the morning

## 2023-06-13 ENCOUNTER — Other Ambulatory Visit: Payer: Self-pay

## 2023-06-13 DIAGNOSIS — R600 Localized edema: Secondary | ICD-10-CM

## 2023-06-13 DIAGNOSIS — M1990 Unspecified osteoarthritis, unspecified site: Secondary | ICD-10-CM | POA: Diagnosis not present

## 2023-06-13 DIAGNOSIS — I110 Hypertensive heart disease with heart failure: Secondary | ICD-10-CM | POA: Diagnosis not present

## 2023-06-19 ENCOUNTER — Ambulatory Visit (HOSPITAL_COMMUNITY)
Admission: RE | Admit: 2023-06-19 | Discharge: 2023-06-19 | Disposition: A | Discharge status: Home / Self Care | Payer: Medicare Other | Source: Ambulatory Visit | Attending: Family Medicine | Admitting: Family Medicine

## 2023-06-19 DIAGNOSIS — R600 Localized edema: Secondary | ICD-10-CM | POA: Insufficient documentation

## 2023-07-09 ENCOUNTER — Other Ambulatory Visit: Payer: Self-pay | Admitting: Family Medicine

## 2023-07-11 ENCOUNTER — Ambulatory Visit: Payer: Medicare Other | Admitting: Family Medicine

## 2023-07-12 NOTE — Telephone Encounter (Signed)
May have 6 months on these medicines patient should follow-up within the next 4 to 5 months

## 2023-08-15 ENCOUNTER — Ambulatory Visit: Payer: Self-pay | Admitting: Family Medicine

## 2023-08-15 NOTE — Telephone Encounter (Signed)
Copied from CRM [id number]. Topic: Clinical - Red Word Triage >> Aug 15, 2023  3:46 PM Chantha C wrote: Red Word that prompted transfer to Nurse Triage: Head hurting by the R side temple, forehead, eyes down to the ears for the last month and a half. The pain is increasing, and vision is blurry, pt does have high blood pressure but controlled with meds. Has not checked her b/p, does not have device. Pls advise [phone]   Chief Complaint: Headache x 1 month Symptoms: Headache behind eyes, extending into ear Frequency: intermittent x 1 month, pain relieved with OTC pain meds but return Pertinent Negatives: Patient denies head injury or fever Disposition: [] ED /[x] Urgent Care (no appt availability in office) / [] Appointment(In office/virtual)/ []  [place] [facility]/ [] Home Care/ [] Refused Recommended Disposition /[] [place] [facility]/ []  Follow-up with PCP Additional Notes: Pt reports intermittent headache x 1 month, sts that she has been taking OTC pain medications with some relief but pain returns after a while. Patient took BP with RN on phone, BP is 159/79. RN advised patient that she needed to be seen at the ED today, pt stated that she preferred to not go. RN advised patient to go to Urgent Care, patient is agreeable, sts that she will have her daughter take her to [facility] tomorrow. Patient still requesting appt at practice, appt scheduled for 08/20/23 at 0840   Reason for Disposition  [1] MODERATE headache (e.g., interferes with normal activities) AND [2] present > 24 hours AND [3] unexplained  (Exceptions: analgesics not tried, typical migraine, or headache part of viral illness)  Answer Assessment - Initial Assessment Questions 1. LOCATION: "Where does it hurt?"      Right temple, to behind the eyes and ear  2. ONSET: "When did the headache start?" (Minutes, hours or days)      Has been going on for about a month  3. PATTERN: "Does the pain come and go, or has  it been constant since it started?"     Comes back every day  4. SEVERITY: "How bad is the pain?" and "What does it keep you from doing?"  (e.g., Scale 1-10; mild, moderate, or severe)   - MILD (1-3): doesn't interfere with normal activities    - MODERATE (4-7): interferes with normal activities or awakens from sleep    - SEVERE (8-10): excruciating pain, unable to do any normal activities        Level 10 pain  5. RECURRENT SYMPTOM: "Have you ever had headaches before?" If Yes, ask: "When was the last time?" and "What happened that time?"      No headaches like this, has occasional headaches  6. CAUSE: "What do you think is causing the headache?"     Has history of aneurysm? But doesn't think this is the cause   7. MIGRAINE: "Have you been diagnosed with migraine headaches?" If Yes, ask: "Is this headache similar?"      No history  8. HEAD INJURY: "Has there been any recent injury to the head?"      Denies head injury  9. OTHER SYMPTOMS: "Do you have any other symptoms?" (fever, stiff neck, eye pain, sore throat, cold symptoms)     Blured vision  Protocols used: Headache-A-AH

## 2023-08-17 ENCOUNTER — Encounter: Payer: Self-pay | Admitting: Physician Assistant

## 2023-08-17 ENCOUNTER — Ambulatory Visit (INDEPENDENT_AMBULATORY_CARE_PROVIDER_SITE_OTHER): Payer: Medicare Other | Admitting: Physician Assistant

## 2023-08-17 VITALS — BP 150/70 | HR 74 | Temp 97.6°F | Ht 66.0 in | Wt 184.0 lb

## 2023-08-17 DIAGNOSIS — R7982 Elevated C-reactive protein (CRP): Secondary | ICD-10-CM | POA: Diagnosis not present

## 2023-08-17 DIAGNOSIS — R519 Headache, unspecified: Secondary | ICD-10-CM | POA: Diagnosis not present

## 2023-08-17 MED ORDER — PREDNISONE 50 MG PO TABS
ORAL_TABLET | ORAL | 0 refills | Status: DC
Start: 2023-08-17 — End: 2023-09-24

## 2023-08-17 NOTE — Progress Notes (Signed)
Acute Office Visit  Subjective:     Patient ID: Melissa Barajas, female    DOB: 12-07-1940, 82 y.o.   MRN: [id number]   HPI  Patient is in today for headaches that have been occurring every day for 2 months.  She states location is right temporal region radiating to right her ear and her eye.  She describes headaches are a stabbing pains, but there is no pattern or regularity associated. She is taking tylenol for headaches that helps relieve symptoms, however she reports headaches do not go away if she does not take tylenol.  She denies visual changes, vomiting, or pain with chewing. 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 She recently had a mildly elevated CPR on 9/30. She does not report changes to daily activity, but frustration with daily headaches.  Review of Systems  HENT:  Positive for ear pain (With headaches).   Eyes:  Positive for pain (With headaches).  Neurological:  Positive for headaches.  All other systems reviewed and are negative.      Objective:     BP (!) 150/70   Pulse 74   Temp 97.6 F (36.4 C) (Oral)   Ht 5\' 6"  (1.676 m)   Wt 184 lb (83.5 kg)   SpO2 96%   BMI 29.70 kg/m   Physical Exam Constitutional:      Appearance: Normal appearance.  HENT:     Head: Normocephalic.     Nose: Nose normal.     Mouth/Throat:     Mouth: Mucous membranes are moist.     Pharynx: Oropharynx is clear.  Eyes:     General: Vision grossly intact. No visual field deficit.    Extraocular Movements: Extraocular movements intact.     Pupils: Pupils are equal, round, and reactive to light.  Cardiovascular:     Rate and Rhythm: Normal rate and regular rhythm.     Heart sounds: No murmur heard.    No gallop.  Pulmonary:     Effort: Pulmonary effort is normal.     Breath sounds: Normal breath sounds.  Neurological:     Mental Status: She is alert and oriented to person, place, and time.     Sensory: Sensation is intact.     Motor: No weakness.      Coordination: Coordination is intact.     No results found for any visits on 08/17/23.      Assessment & Plan:  Right temporal headache -     Ambulatory referral to Neurology -     predniSONE; Take 1 tablet (50 mg) daily for 5 days.  Dispense: 5 tablet; Refill: 0  CRP elevated -     Ambulatory referral to Neurology -     predniSONE; Take 1 tablet (50 mg) daily for 5 days.  Dispense: 5 tablet; Refill: 0   Due to 2 month duration of headaches, location of symptoms, as well as elevated CRP on 9/30, concern for temporal arteritis today.  Physical exam relatively benign today. Visual fields and EOM intact. Facial sensation intact. No temporal tenderness. Patient started on 50 mg of prednisone for 5 days until she can follow-up with [place] on 12/10.  Neurology consult placed today for further workup.  Patient may continue to take Tylenol for relief, she was advised to take less than 3000 mg a day.  Patient to call office or go to ER for worsening symptoms such as vision loss, jaw pain, or fever. Case discussed  wit supervising physician for guidance.   Return in 4 days (on 08/21/2023).  [name] Aarik Blank, PA-C

## 2023-08-17 NOTE — Patient Instructions (Signed)
Appointment with Dr. Gerda Diss on Tuesday 12/10 at 11:20am

## 2023-08-20 ENCOUNTER — Ambulatory Visit: Payer: Medicare Other | Admitting: Family Medicine

## 2023-08-21 ENCOUNTER — Ambulatory Visit (INDEPENDENT_AMBULATORY_CARE_PROVIDER_SITE_OTHER): Payer: Medicare Other | Admitting: Family Medicine

## 2023-08-21 VITALS — BP 132/70 | Ht 66.0 in | Wt 184.0 lb

## 2023-08-21 DIAGNOSIS — R7982 Elevated C-reactive protein (CRP): Secondary | ICD-10-CM

## 2023-08-21 DIAGNOSIS — R519 Headache, unspecified: Secondary | ICD-10-CM | POA: Diagnosis not present

## 2023-08-21 NOTE — Progress Notes (Addendum)
   Subjective:    Patient ID: Melissa Barajas, female    DOB: 11-17-1940, 82 y.o.   MRN: [id number]  HPI  Patient arrives for a follow up on headaches. Patient states she is feeling better Patient relates headaches in the right temporal region.  Previous MRI reviewed and lab work have been completed [employer] recently and was treated with prednisone Seeing dramatic improvement with prednisone No headaches Feeling much better Patient states she is able to do activities within her household without difficulty no chest tightness pressure pain patient also relates no difficulty breathing or other issues currently Review of Systems     Objective:   Physical Exam  General-in no acute distress Eyes-no discharge Lungs-respiratory rate normal, CTA CV-no murmurs,RRR Extremities skin warm dry no edema Neuro grossly normal Behavior normal, alert Right temporal region some tenderness       Assessment & Plan:  Headaches over the past several months in the right temporal region Some tenderness CRP slightly elevated but sed rate was normal Patient improved dramatically with prednisone Need to rule out temporal arteritis Will connect with ENT regarding possibility of more urgent workup/temporal artery biopsy  Addendum-patient's overall health is relatively good.  She is active within her house without having any signs of angina or significant respiratory issues She is able to perform household duties without any significant shortness of breath or chest tightness.  She would be considered a low risk assessment for having a temporal artery biopsy.  She does have a history of blood pressure as her most pressing current issue.  Her blood pressure currently is under good control.

## 2023-08-22 ENCOUNTER — Other Ambulatory Visit: Payer: Self-pay

## 2023-08-22 ENCOUNTER — Telehealth: Payer: Self-pay | Admitting: Family Medicine

## 2023-08-22 DIAGNOSIS — R519 Headache, unspecified: Secondary | ICD-10-CM

## 2023-08-22 NOTE — Telephone Encounter (Signed)
Nurses Please go ahead with consultation to [facility] ENT Dr.Soldatova  They are seeing her in the near future for evaluation of temporal arteritis. Please make sure that their office gets a referral They have her scheduled December 17 Patient is aware

## 2023-08-27 DIAGNOSIS — H5203 Hypermetropia, bilateral: Secondary | ICD-10-CM | POA: Diagnosis not present

## 2023-08-28 ENCOUNTER — Encounter (INDEPENDENT_AMBULATORY_CARE_PROVIDER_SITE_OTHER): Payer: Self-pay | Admitting: Otolaryngology

## 2023-08-28 ENCOUNTER — Ambulatory Visit (INDEPENDENT_AMBULATORY_CARE_PROVIDER_SITE_OTHER): Payer: Medicare Other | Admitting: Otolaryngology

## 2023-08-28 VITALS — BP 142/72 | HR 74 | Ht 66.0 in | Wt 185.0 lb

## 2023-08-28 DIAGNOSIS — R519 Headache, unspecified: Secondary | ICD-10-CM | POA: Diagnosis not present

## 2023-08-28 DIAGNOSIS — M316 Other giant cell arteritis: Secondary | ICD-10-CM

## 2023-08-28 DIAGNOSIS — M26629 Arthralgia of temporomandibular joint, unspecified side: Secondary | ICD-10-CM

## 2023-08-28 DIAGNOSIS — F458 Other somatoform disorders: Secondary | ICD-10-CM | POA: Diagnosis not present

## 2023-08-28 NOTE — Progress Notes (Signed)
ENT CONSULT:  Reason for Consult: right sided headache concern for temporal arteritis    HPI: Discussed the use of AI scribe software for clinical note transcription with the patient, who gave verbal consent to proceed.  History of Present Illness   The patient is an 63 yoF, with a history of thyroid surgery (she is unsure if it was partial thyroidectomy vs total) and a brain aneurysm, presents with a chief complaint of persistent pain around the right temple area, radiating down the side of the eye and towards the ear. The pain, described as an ache, is intermittent and has been waking the patient up at night. The patient also reports episodes of blurry vision and dizziness, with one significant episode lasting approximately four hours, during which she experienced difficulty walking in a straight line and had a sensation of the room moving. The patient also reported feeling unwell after this episode.  The patient has a history of teeth grinding, but denies that the current symptoms are similar to those experienced when she has TMJ pain. She has tried a mouth guard in the past but found it uncomfortable. The patient has no history of headaches, migraines, autoimmune conditions, or vasculitis. She has been a non-smoker since 1989.  The patient was previously treated with a five-day course of oral steroids, which initially provided some relief. However, on the fourth day of the treatment, the patient reported a return of the right temple pain/headache. The patient's primary care doctor referred him for further investigation due to the persistent pain, elevated inflammatory markers, and the potential risk of temporal arteritis.        Records Reviewed:  Office visit by PCP Patient arrives for a follow up on headaches. Patient states she is feeling better Patient relates headaches in the right temporal region.  Previous MRI reviewed and lab work have been completed [place] recently and was  treated with prednisone Seeing dramatic improvement with prednisone No headaches Feeling much better A/P Headaches over the past several months in the right temporal region Some tenderness CRP slightly elevated but sed rate was normal Patient improved dramatically with prednisone Need to rule out temporal arteritis Will connect with ENT regarding possibility of more urgent workup/temporal artery biopsy    Past Medical History:  Diagnosis Date   Arthritis    Atrial fibrillation (HCC)    Collagen vascular disease (HCC)    DIVERTICULOSIS OF COLON 08/25/2010   Qualifier: Diagnosis of  By: Rexene Alberts     GERD (gastroesophageal reflux disease)    HEMORRHOIDS, INTERNAL 08/25/2010   Qualifier: Diagnosis of  By: Rexene Alberts     Hypercholesteremia    Hypertension    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 screening colonoscopy in 10 years 2. She should follow a high fiber diet She has been given a hand out on high fiber diverticulosois and hemorrhoids   COLONOSCOPY N/A 02/07/2018   Procedure: COLONOSCOPY;  Surgeon: West Bali, MD;  [facility]: [facility];  Service: Endoscopy;  Laterality: N/A;  1:00pm   CYSTECTOMY     Left breast  ESOPHAGOGASTRODUODENOSCOPY     YNW:[id number] appearing Schatzki's ring, possible cervical esophageal web, both dilated with passage of a Maloney dilator/small hiatal hernia   ESOPHAGOGASTRODUODENOSCOPY N/A 10/25/2015   Procedure: ESOPHAGOGASTRODUODENOSCOPY (EGD);  Surgeon: West Bali, MD;  [facility];  Service: Endoscopy;  Laterality: N/A;  1215pm   ESOPHAGOGASTRODUODENOSCOPY N/A 10/24/2019   Schatzki's ring  s/p dilation and single medium firm, probable submucosal nodule without bleeding. Medium sized hiatal hernia, normal duodenum.    ESOPHAGOGASTRODUODENOSCOPY N/A 04/05/2020   10 mm leiomyoma and capsule study without obvious lesion or bleeding.   EUS  11/2019   Dr. Dulce Sellar: EUS completed March 2021 with likely leiomyoma, no other concerns. 10 mm by 10 mm.    GIVENS CAPSULE STUDY N/A 04/06/2020   Procedure: GIVENS CAPSULE STUDY;  Surgeon: Malissa Hippo, MD;  [facility];  Service: Endoscopy;  Laterality: N/A;   POLYPECTOMY  02/07/2018   Procedure: POLYPECTOMY;  Surgeon: West Bali, MD;  [facility];  Service: Endoscopy;;  ascending colon, descending, sigmoid   SAVORY DILATION N/A 10/25/2015   Procedure: SAVORY DILATION;  Surgeon: West Bali, MD;  [facility];  Service: Endoscopy;  Laterality: N/A;   SAVORY DILATION N/A 10/24/2019   Procedure: SAVORY DILATION;  Surgeon: West Bali, MD;  [facility];  Service: Endoscopy;  Laterality: N/A;   THYROIDECTOMY     Tumor   05/2010   Left breast under nipple   VESICOVAGINAL FISTULA CLOSURE W/ TAH     fibroid tumors    Family History  Problem Relation Age of Onset   Cancer Brother        Prostate    Social History:  reports that she quit smoking about 29 years ago. Her smoking use included cigarettes. She started smoking about 39 years ago. She has a 15 pack-year smoking history. She has never used smokeless tobacco. She reports that she does not drink alcohol and does not use drugs.  Allergies:  Allergies  Allergen Reactions   Aricept [Donepezil Hcl]     Sleep deprived   Hydrocodone Nausea Only   Lisinopril Other (See Comments)    Angio edema   Naprosyn [Naproxen] Nausea Only   Oxycodone     Other Reaction(s): GI Intolerance   Rofecoxib     Unknown reaction  Other Reaction(s): unknown reaction    Medications: I have reviewed the patient's current medications.  The PMH, PSH,  Medications, Allergies, and SH were reviewed and updated.  ROS: Constitutional: Negative for fever, weight loss and weight gain. Cardiovascular: Negative for chest pain and dyspnea on exertion. Respiratory: Is not experiencing shortness of breath at rest. Gastrointestinal: Negative for nausea and vomiting. Neurological: Negative for headaches. Psychiatric: The patient is not nervous/anxious  Blood pressure (!) 142/72, pulse 74, height 5\' 6"  (1.676 m), weight 185 lb (83.9 kg), SpO2 96%.  PHYSICAL EXAM:  Exam: General: Well-developed, well-nourished Respiratory Respiratory effort: Equal inspiration and expiration without stridor Cardiovascular Peripheral Vascular: Warm extremities with equal color/perfusion Eyes: No nystagmus with equal extraocular motion bilaterally Neuro/Psych/Balance: Patient oriented to person, place, and time; Appropriate mood and affect; Gait is intact with no imbalance; Cranial nerves I-XII are intact Head and Face Inspection: Normocephalic and atraumatic without mass or lesion Palpation: Facial skeleton intact without bony stepoffs Salivary Glands: No mass or tenderness Facial Strength: Facial motility symmetric and full bilaterally ENT Pinna: External ear intact and fully developed External canal: Canal is patent with intact skin  Tympanic Membrane: Clear and mobile External Nose: No scar or anatomic deformity Internal Nose: Septum is relatively straight on anterior rhinoscopy.  Lips, Teeth, and gums: Mucosa and teeth intact and viable TMJ: No pain to palpation, mild subluxation on the right with full mobility Oral cavity/oropharynx: No erythema or exudate, no lesions present Neck Neck and Trachea: Midline trachea without mass or lesion Thyroid: No mass or nodularity Lymphatics: No lymphadenopathy Skin: evidence of alopecia   Procedure: none   Studies Reviewed: Labs   MRI/Angio brain  CLINICAL DATA:  Aneurysm, cerebral, nonruptured. Rt side  headache x 2 years   EXAM: MRA HEAD WITHOUT CONTRAST   TECHNIQUE: Angiographic images of the Circle of Willis were acquired using MRA technique without intravenous contrast.   COMPARISON:  [facility]A 09/12/2022.   FINDINGS: Motion limited study.   Anterior circulation: Motion limited study. 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 Bilateral intracranial ICAs, MCAs, and ACAs are patent without proximal high-grade stenosis.   Posterior circulation: Visualized intradural vertebral arteries, basilar artery and bilateral posterior cerebral arteries are patent without proximal high-grade stenosis.   IMPRESSION: 1. Motion limited study. 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 2. No large vessel occlusion or proximal high-grade stenosis.    Assessment/Plan: Encounter Diagnoses  Name Primary?   Temporal arteritis syndrome (HCC) Yes   Chronic nonintractable headache, unspecified headache type    Bruxism    Arthralgia of temporomandibular joint, unspecified laterality     Assessment and Plan    Concern for Temporal Arteritis Suspected temporal arteritis due to persistent right temporal pain, potential episode of vision changes, and elevated inflammatory markers, with sx relief after oral steroids given by PCP. Pain extends to the side of the eye and ear, exacerbated at night. Symptoms improved with a short course of oral steroids but recurred post-treatment. Discussed temporal artery biopsy procedure and risks, including bleeding, hematoma, scar, nerve damage, infection, and potential for hypertrophic scar or keloid. Emphasized biopsy importance to rule out the diagnosis that can cause vision loss. Other potential causes of her sx include trigeminal neuralgia due to episodes of sharp, shooting pain along the temple, midface, and lower face. Her sx could  also be related to TMJ sy 2/2 hx of bruxism.  - Schedule temporal artery biopsy at [facility] - Advised her to contact primary care physician if symptoms worsen or significant vision changes occur - will need pre-op clearance from PCP - not on blood thinners per report  Co-morbidities:  No history of autoimmune conditions, vasculitis, stroke, myocardial infarction, pulmonary conditions, or significant anesthesia complications. Former smoker, quit in 1989.   Follow-up - schedule for temporal artery biopsy - Advise follow-up with primary care physician if symptoms worsen or vision changes occur.  - will need to get clearance from PCP prior to surgery       Thank you for allowing me to participate in the care of this patient. Please do not hesitate to contact me with any questions or concerns.   Ashok Croon, MD Otolaryngology [facility] Phone: [phone] Fax: [phone]    08/28/2023, 8:24 PM

## 2023-09-10 DIAGNOSIS — Z1231 Encounter for screening mammogram for malignant neoplasm of breast: Secondary | ICD-10-CM | POA: Diagnosis not present

## 2023-09-10 LAB — HM MAMMOGRAPHY

## 2023-09-11 ENCOUNTER — Encounter: Payer: Self-pay | Admitting: Family Medicine

## 2023-09-17 ENCOUNTER — Other Ambulatory Visit: Payer: Self-pay | Admitting: Family Medicine

## 2023-09-17 ENCOUNTER — Telehealth: Payer: Self-pay | Admitting: *Deleted

## 2023-09-17 ENCOUNTER — Other Ambulatory Visit: Payer: Self-pay | Admitting: Nurse Practitioner

## 2023-09-17 MED ORDER — AMLODIPINE BESYLATE 10 MG PO TABS: ORAL_TABLET | ORAL | 0 refills | Status: DC

## 2023-09-17 NOTE — Telephone Encounter (Signed)
 Copied from CRM [id number]. Topic: Clinical - Prescription Issue >> Sep 17, 2023 10:41 AM Nestora J wrote: Reason for CRM: Pt states it will take 7 days for to get her prescription refill but she is out of medication and wants to know if a small dosage could be sent to her local pharmacy until she gets her refill in the mail amLODipine  (NORVASC ) 10 MG tablet

## 2023-09-17 NOTE — Telephone Encounter (Signed)
 Done

## 2023-09-19 ENCOUNTER — Telehealth: Payer: Self-pay

## 2023-09-19 NOTE — Telephone Encounter (Signed)
 Reason for CRM: patient is calling in because she wanted to see if she could get a sample for rx amLODipine (NORVASC) 10 MG tablet  because the pharmacy will not refill it 09/26/2023

## 2023-09-19 NOTE — Telephone Encounter (Signed)
 Unfortunately there are no samples for this medicine This medication comes in generic so therefore samples not available We do not have sample of other medications that would take the place of this (It does raise her question is there a reason that she is out of the medicine?) (It is unusual for pharmacy to refuse to allow her to get additional medicine-another words there is more to the story than what is appearing)  She may be able to get a reasonable cash price through [employer] (Possibly we could help her look into [facility] to help her get more affordable prescription)

## 2023-09-20 NOTE — Telephone Encounter (Signed)
 Called pt to get more information on the situation with Amlodipine , Pt states she is unsure of what happened but she ran out of blood pressure medication, pt states she is not the only one living in her home and it is possible someone else took the medication. Pt would like to know if there is any solution to this to where she can get a refill on her medication early

## 2023-09-21 ENCOUNTER — Other Ambulatory Visit: Payer: Self-pay | Admitting: Family Medicine

## 2023-09-21 MED ORDER — AMLODIPINE BESYLATE 10 MG PO TABS: 10.0000 mg | ORAL_TABLET | Freq: Every day | ORAL | 0 refills | Status: DC

## 2023-09-21 NOTE — Telephone Encounter (Signed)
 Patient notified that 6 pills will be sent in for cash pay till script can be sent in

## 2023-09-21 NOTE — Progress Notes (Signed)
 Somehow patient ran short of her medicine Prescription requested by patient We tried several times to talk with pharmacy to try to figure out this situation but every time was put on hold unable to speak with the person Nurse will call patient to let her know that we sent in a cash price prescription for 6 tablets

## 2023-09-21 NOTE — Telephone Encounter (Signed)
 Unfortunately insurance companies typically will not allow for refill to be done early Please call her pharmacy to find out if they would allow for a refill to be early If not can we send him a 10-day supply and pharmacy give her a reasonable price for this?  Please try to work on this and also keep patient informed  1 additional option would be paying cash price for 10 days worth of medicine through independent pharmacy sometimes can get a better price than what you get through a chain pharmacy

## 2023-09-24 ENCOUNTER — Encounter (INDEPENDENT_AMBULATORY_CARE_PROVIDER_SITE_OTHER): Payer: Medicare Other

## 2023-09-24 ENCOUNTER — Telehealth (INDEPENDENT_AMBULATORY_CARE_PROVIDER_SITE_OTHER): Payer: Medicare Other

## 2023-09-27 DIAGNOSIS — K080 Exfoliation of teeth due to systemic causes: Secondary | ICD-10-CM | POA: Diagnosis not present

## 2023-09-27 NOTE — Progress Notes (Addendum)
Surgical Instructions   Your procedure is scheduled on Wednesday, January 22nd, 2025. Report to [facility] Main Entrance "A" at 7:30 A.M., then check in with the Admitting office. Any questions or running late day of surgery: call [phone]  Questions prior to your surgery date: call [phone], Monday-Friday, 8am-4pm. If you experience any cold or flu symptoms such as cough, fever, chills, shortness of breath, etc. between now and your scheduled surgery, please notify us at the above number.     Remember:  Do not eat or drink  after midnight the night before your surgery    Take these medicines the morning of surgery with A SIP OF WATER: Amlodipine (Norvasc) Breo Inhaler Clonidine (Catapres)    May take these medicines IF NEEDED: Acetaminophen (Tylenol)   One week prior to surgery, STOP taking any Aspirin (unless otherwise instructed by your surgeon) Aleve, Naproxen, Ibuprofen, Motrin, Advil, Goody's, BC's, all herbal medications, fish oil, and non-prescription vitamins.                     Do NOT Smoke (Tobacco/Vaping) for 24 hours prior to your procedure.  If you use a CPAP at night, you may bring your mask/headgear for your overnight stay.   You will be asked to remove any contacts, glasses, piercing's, hearing aid's, dentures/partials prior to surgery. Please bring cases for these items if needed.    Patients discharged the day of surgery will not be allowed to drive home, and someone needs to stay with them for 24 hours.  SURGICAL WAITING ROOM VISITATION Patients may have no more than 2 support people in the waiting area - these visitors may rotate.   Pre-op nurse will coordinate an appropriate time for 1 ADULT support person, who may not rotate, to accompany patient in pre-op.  Children under the age of 26 must have an adult with them who is not the patient and must remain in the main waiting area with an adult.  If the patient needs to stay at the hospital  during part of their recovery, the visitor guidelines for inpatient rooms apply.  Please refer to the [facility] website for the visitor guidelines for any additional information.   If you received a COVID test during your pre-op visit  it is requested that you wear a mask when out in public, stay away from anyone that may not be feeling well and notify your surgeon if you develop symptoms. If you have been in contact with anyone that has tested positive in the last 10 days please notify you surgeon.      Pre-operative CHG Bathing Instructions   You can play a key role in reducing the risk of infection after surgery. Your skin needs to be as free of germs as possible. You can reduce the number of germs on your skin by washing with CHG (chlorhexidine gluconate) soap before surgery. CHG is an antiseptic soap that kills germs and continues to kill germs even after washing.   DO NOT use if you have an allergy to chlorhexidine/CHG or antibacterial soaps. If your skin becomes reddened or irritated, stop using the CHG and notify one of our RNs at [phone].              TAKE A SHOWER THE NIGHT BEFORE SURGERY AND THE DAY OF SURGERY    Please keep in mind the following:  DO NOT shave, including legs and underarms, 48 hours prior to surgery.   You may shave your  face before/day of surgery.  Place clean sheets on your bed the night before surgery Use a clean washcloth (not used since being washed) for each shower. DO NOT sleep with pet's night before surgery.  CHG Shower Instructions:  Wash your face and private area with normal soap. If you choose to wash your hair, wash first with your normal shampoo.  After you use shampoo/soap, rinse your hair and body thoroughly to remove shampoo/soap residue.  Turn the water OFF and apply half the bottle of CHG soap to a CLEAN washcloth.  Apply CHG soap ONLY FROM YOUR NECK DOWN TO YOUR TOES (washing for 3-5 minutes)  DO NOT use CHG soap on face, private  areas, open wounds, or sores.  Pay special attention to the area where your surgery is being performed.  If you are having back surgery, having someone wash your back for you may be helpful. Wait 2 minutes after CHG soap is applied, then you may rinse off the CHG soap.  Pat dry with a clean towel  Put on clean pajamas    Additional instructions for the day of surgery: DO NOT APPLY any lotions, deodorants, cologne, or perfumes.   Do not wear jewelry or makeup Do not wear nail polish, gel polish, artificial nails, or any other type of covering on natural nails (fingers and toes) Do not bring valuables to the hospital. [facility] is not responsible for valuables/personal belongings. Put on clean/comfortable clothes.  Please brush your teeth.  Ask your nurse before applying any prescription medications to the skin.

## 2023-09-28 ENCOUNTER — Encounter (HOSPITAL_COMMUNITY)
Admission: RE | Admit: 2023-09-28 | Discharge: 2023-09-28 | Disposition: A | Discharge status: Home / Self Care | Payer: Medicare Other | Source: Ambulatory Visit | Attending: Otolaryngology | Admitting: Otolaryngology

## 2023-09-28 ENCOUNTER — Encounter (HOSPITAL_COMMUNITY): Payer: Self-pay

## 2023-09-28 ENCOUNTER — Other Ambulatory Visit: Payer: Self-pay

## 2023-09-28 VITALS — BP 154/67 | HR 69 | Temp 98.4°F | Resp 18 | Ht 66.0 in | Wt 185.3 lb

## 2023-09-28 DIAGNOSIS — R001 Bradycardia, unspecified: Secondary | ICD-10-CM | POA: Diagnosis not present

## 2023-09-28 DIAGNOSIS — Z01818 Encounter for other preprocedural examination: Secondary | ICD-10-CM | POA: Diagnosis not present

## 2023-09-28 LAB — CBC
HCT: 44.6 % (ref 36.0–46.0)
Hemoglobin: 14.5 g/dL (ref 12.0–15.0)
MCH: 31.7 pg (ref 26.0–34.0)
MCHC: 32.5 g/dL (ref 30.0–36.0)
MCV: 97.6 fL (ref 80.0–100.0)
Platelets: 253 10*3/uL (ref 150–400)
RBC: 4.57 MIL/uL (ref 3.87–5.11)
RDW: 14.6 % (ref 11.5–15.5)
WBC: 7.8 10*3/uL (ref 4.0–10.5)
nRBC: 0 % (ref 0.0–0.2)

## 2023-09-28 LAB — BASIC METABOLIC PANEL
Anion gap: 8 (ref 5–15)
BUN: 9 mg/dL (ref 8–23)
CO2: 29 mmol/L (ref 22–32)
Calcium: 10 mg/dL (ref 8.9–10.3)
Chloride: 103 mmol/L (ref 98–111)
Creatinine, Ser: 0.75 mg/dL (ref 0.44–1.00)
GFR, Estimated: >60 mL/min (ref >60)
Glucose, Bld: 107 mg/dL — ABNORMAL HIGH (ref 70–99)
Potassium: 4.2 mmol/L (ref 3.5–5.1)
Sodium: 140 mmol/L (ref 135–145)

## 2023-09-28 NOTE — Progress Notes (Signed)
PCP - Dr. [name] Cardiologist - denies  PPM/ICD - denies Device Orders - n/a Rep Notified - n/a  Chest x-ray - denies EKG - 09/28/23 Stress Test - 2016 ECHO - 06/21/22 Cardiac Cath - denies  Sleep Study - denies CPAP - n/a  No DM  Last dose of GLP1 agonist-  n/a GLP1 instructions: n/a  Blood Thinner Instructions: n/a Aspirin Instructions: n/a  ERAS Protcol - NPO   COVID TEST- n/a   Anesthesia review: yes - history of a fib, htn, ekg  Patient denies shortness of breath, fever, cough and chest pain at PAT appointment   All instructions explained to the patient, with a verbal understanding of the material. Patient agrees to go over the instructions while at home for a better understanding. Patient also instructed to self quarantine after being tested for COVID-19. The opportunity to ask questions was provided.

## 2023-10-01 ENCOUNTER — Encounter (HOSPITAL_COMMUNITY): Payer: Self-pay

## 2023-10-01 ENCOUNTER — Telehealth (INDEPENDENT_AMBULATORY_CARE_PROVIDER_SITE_OTHER): Payer: Medicare Other | Admitting: Otolaryngology

## 2023-10-01 DIAGNOSIS — R519 Headache, unspecified: Secondary | ICD-10-CM

## 2023-10-01 DIAGNOSIS — M316 Other giant cell arteritis: Secondary | ICD-10-CM

## 2023-10-01 DIAGNOSIS — G8929 Other chronic pain: Secondary | ICD-10-CM

## 2023-10-02 ENCOUNTER — Telehealth: Payer: Self-pay

## 2023-10-02 ENCOUNTER — Other Ambulatory Visit: Payer: Self-pay

## 2023-10-02 MED ORDER — CLONIDINE HCL 0.2 MG PO TABS: 0.2000 mg | ORAL_TABLET | Freq: Two times a day (BID) | ORAL | 3 refills | Status: DC

## 2023-10-02 NOTE — Telephone Encounter (Signed)
Prescription Request  10/02/2023  LOV: Visit date not found  What is the name of the medication or equipment? cloNIDine (CATAPRES) 0.2 MG tablet   Have you contacted your pharmacy to request a refill? Yes   Which pharmacy would you like this sent to?   [facility]   Patient notified that their request is being sent to the clinical staff for review and that they should receive a response within 2 business days.   Please advise at Mobile [phone] (mobile)

## 2023-10-02 NOTE — Progress Notes (Signed)
Connected with the patient via phone to review upcoming surgery.  Medication list reviewed, all questions have been answered.  Risks and benefits of the procedure discussed and patient would like to proceed.  Already had her preoperative appointment and they received instructions from the hospital staff.

## 2023-10-03 ENCOUNTER — Encounter (HOSPITAL_COMMUNITY): Payer: Self-pay

## 2023-10-03 ENCOUNTER — Other Ambulatory Visit: Payer: Self-pay

## 2023-10-03 ENCOUNTER — Ambulatory Visit (HOSPITAL_COMMUNITY): Payer: Self-pay | Admitting: Physician Assistant

## 2023-10-03 ENCOUNTER — Ambulatory Visit (HOSPITAL_COMMUNITY)
Admission: RE | Admit: 2023-10-03 | Discharge: 2023-10-03 | Disposition: A | Discharge status: Home / Self Care | Payer: Medicare Other | Attending: Otolaryngology | Admitting: Otolaryngology

## 2023-10-03 ENCOUNTER — Ambulatory Visit (HOSPITAL_COMMUNITY): Payer: Medicare Other | Admitting: Anesthesiology

## 2023-10-03 ENCOUNTER — Encounter (HOSPITAL_COMMUNITY)
Admission: RE | Disposition: A | Discharge status: Home / Self Care | Payer: Self-pay | Source: Home / Self Care | Attending: Otolaryngology

## 2023-10-03 DIAGNOSIS — I10 Essential (primary) hypertension: Secondary | ICD-10-CM | POA: Diagnosis not present

## 2023-10-03 DIAGNOSIS — Z87891 Personal history of nicotine dependence: Secondary | ICD-10-CM | POA: Diagnosis not present

## 2023-10-03 DIAGNOSIS — M316 Other giant cell arteritis: Secondary | ICD-10-CM | POA: Diagnosis not present

## 2023-10-03 DIAGNOSIS — K219 Gastro-esophageal reflux disease without esophagitis: Secondary | ICD-10-CM | POA: Insufficient documentation

## 2023-10-03 DIAGNOSIS — E785 Hyperlipidemia, unspecified: Secondary | ICD-10-CM | POA: Diagnosis not present

## 2023-10-03 DIAGNOSIS — Z79899 Other long term (current) drug therapy: Secondary | ICD-10-CM | POA: Diagnosis not present

## 2023-10-03 DIAGNOSIS — I4891 Unspecified atrial fibrillation: Secondary | ICD-10-CM | POA: Insufficient documentation

## 2023-10-03 DIAGNOSIS — R519 Headache, unspecified: Secondary | ICD-10-CM | POA: Diagnosis not present

## 2023-10-03 HISTORY — PX: ARTERY BIOPSY: SHX891

## 2023-10-03 SURGERY — BIOPSY TEMPORAL ARTERY
Anesthesia: Monitor Anesthesia Care | Laterality: Right

## 2023-10-03 MED ORDER — CEFAZOLIN SODIUM 1 G IJ SOLR
INTRAMUSCULAR | Status: AC
  Filled 2023-10-03: qty 20

## 2023-10-03 MED ORDER — LIDOCAINE 2% (20 MG/ML) 5 ML SYRINGE
INTRAMUSCULAR | Status: AC
  Filled 2023-10-03: qty 5

## 2023-10-03 MED ORDER — 0.9 % SODIUM CHLORIDE (POUR BTL) OPTIME
TOPICAL | Status: DC | PRN
  Administered 2023-10-03: 1000 mL

## 2023-10-03 MED ORDER — ACETAMINOPHEN 10 MG/ML IV SOLN: 1000.0000 mg | Freq: Once | INTRAVENOUS | Status: DC | PRN

## 2023-10-03 MED ORDER — CEFAZOLIN SODIUM-DEXTROSE 2-3 GM-%(50ML) IV SOLR
INTRAVENOUS | Status: DC | PRN
  Administered 2023-10-03: 2 g via INTRAVENOUS

## 2023-10-03 MED ORDER — ORAL CARE MOUTH RINSE: 15.0000 mL | Freq: Once | OROMUCOSAL | Status: AC

## 2023-10-03 MED ORDER — ONDANSETRON HCL 4 MG/2ML IJ SOLN
INTRAMUSCULAR | Status: AC
  Filled 2023-10-03: qty 2

## 2023-10-03 MED ORDER — PROPOFOL 10 MG/ML IV BOLUS
INTRAVENOUS | Status: AC
  Filled 2023-10-03: qty 20

## 2023-10-03 MED ORDER — OXYCODONE HCL 5 MG/5ML PO SOLN
5.0000 mg | Freq: Once | ORAL | Status: DC | PRN
Start: 2023-10-03 — End: 2023-10-03

## 2023-10-03 MED ORDER — FENTANYL CITRATE (PF) 250 MCG/5ML IJ SOLN
INTRAMUSCULAR | Status: DC | PRN
  Administered 2023-10-03: 25 ug via INTRAVENOUS

## 2023-10-03 MED ORDER — LACTATED RINGERS IV SOLN: INTRAVENOUS | Status: DC

## 2023-10-03 MED ORDER — FENTANYL CITRATE (PF) 100 MCG/2ML IJ SOLN
INTRAMUSCULAR | Status: AC
  Filled 2023-10-03: qty 2

## 2023-10-03 MED ORDER — FENTANYL CITRATE (PF) 100 MCG/2ML IJ SOLN: 25.0000 ug | INTRAMUSCULAR | Status: DC | PRN

## 2023-10-03 MED ORDER — ACETAMINOPHEN 160 MG/5ML PO SOLN: 325.0000 mg | ORAL | Status: DC | PRN

## 2023-10-03 MED ORDER — LIDOCAINE-EPINEPHRINE 1 %-1:100000 IJ SOLN
INTRAMUSCULAR | Status: DC | PRN
  Administered 2023-10-03: 4 mL

## 2023-10-03 MED ORDER — PROPOFOL 1000 MG/100ML IV EMUL
INTRAVENOUS | Status: AC
  Filled 2023-10-03: qty 100

## 2023-10-03 MED ORDER — PROPOFOL 500 MG/50ML IV EMUL
INTRAVENOUS | Status: DC | PRN
  Administered 2023-10-03: 100 ug/kg/min via INTRAVENOUS

## 2023-10-03 MED ORDER — MIDAZOLAM HCL 2 MG/2ML IJ SOLN
INTRAMUSCULAR | Status: AC
  Filled 2023-10-03: qty 2

## 2023-10-03 MED ORDER — LIDOCAINE-EPINEPHRINE 1 %-1:100000 IJ SOLN
INTRAMUSCULAR | Status: AC
  Filled 2023-10-03: qty 1

## 2023-10-03 MED ORDER — CHLORHEXIDINE GLUCONATE 0.12 % MT SOLN
15.0000 mL | Freq: Once | OROMUCOSAL | Status: AC
  Administered 2023-10-03: 15 mL via OROMUCOSAL
  Filled 2023-10-03: qty 15

## 2023-10-03 MED ORDER — OXYCODONE HCL 5 MG PO TABS: 5.0000 mg | ORAL_TABLET | Freq: Once | ORAL | Status: DC | PRN

## 2023-10-03 MED ORDER — DEXAMETHASONE SODIUM PHOSPHATE 10 MG/ML IJ SOLN
INTRAMUSCULAR | Status: AC
  Filled 2023-10-03: qty 1

## 2023-10-03 MED ORDER — ACETAMINOPHEN 325 MG PO TABS: 325.0000 mg | ORAL_TABLET | ORAL | Status: DC | PRN

## 2023-10-03 MED ORDER — DROPERIDOL 2.5 MG/ML IJ SOLN: 0.6250 mg | Freq: Once | INTRAMUSCULAR | Status: DC | PRN

## 2023-10-03 MED ORDER — MIDAZOLAM HCL 2 MG/2ML IJ SOLN
INTRAMUSCULAR | Status: DC | PRN
  Administered 2023-10-03 (×2): 1 mg via INTRAVENOUS

## 2023-10-03 SURGICAL SUPPLY — 50 items
ATTRACTOMAT 16X20 MAGNETIC DRP (DRAPES) IMPLANT
BAG COUNTER SPONGE SURGICOUNT (BAG) ×1 IMPLANT
BLADE SURG 15 STRL LF DISP TIS (BLADE) IMPLANT
BNDG EYE OVAL 2 1/8 X 2 5/8 (GAUZE/BANDAGES/DRESSINGS) IMPLANT
BNDG GAUZE DERMACEA FLUFF 4 (GAUZE/BANDAGES/DRESSINGS) IMPLANT
CANISTER SUCT 3000ML PPV (MISCELLANEOUS) IMPLANT
CATH ROBINSON RED A/P 16FR (CATHETERS) IMPLANT
CLEANER TIP ELECTROSURG 2X2 (MISCELLANEOUS) ×1 IMPLANT
CNTNR URN SCR LID CUP LEK RST (MISCELLANEOUS) ×1 IMPLANT
COVER SURGICAL LIGHT HANDLE (MISCELLANEOUS) ×1 IMPLANT
DERMABOND ADVANCED .7 DNX12 (GAUZE/BANDAGES/DRESSINGS) IMPLANT
DRAIN PENROSE 12X.25 LTX STRL (MISCELLANEOUS) IMPLANT
DRAPE HALF SHEET 40X57 (DRAPES) IMPLANT
DRSG EMULSION OIL 3X3 NADH (GAUZE/BANDAGES/DRESSINGS) IMPLANT
ELECT COATED BLADE 2.86 ST (ELECTRODE) ×1 IMPLANT
ELECT NDL TIP 2.8 STRL (NEEDLE) IMPLANT
ELECT NEEDLE TIP 2.8 STRL (NEEDLE)
ELECT REM PT RETURN 9FT ADLT (ELECTROSURGICAL) ×1
ELECTRODE REM PT RTRN 9FT ADLT (ELECTROSURGICAL) ×1 IMPLANT
GAUZE 4X4 16PLY ~~LOC~~+RFID DBL (SPONGE) IMPLANT
GAUZE SPONGE 4X4 12PLY STRL (GAUZE/BANDAGES/DRESSINGS) IMPLANT
GAUZE STRETCH 2X75IN STRL (MISCELLANEOUS) IMPLANT
GLOVE BIO SURGEON STRL SZ 6 (GLOVE) ×1 IMPLANT
GLOVE BIO SURGEON STRL SZ7.5 (GLOVE) ×1 IMPLANT
GOWN STRL REUS W/ TWL LRG LVL3 (GOWN DISPOSABLE) ×2 IMPLANT
KIT BASIN OR (CUSTOM PROCEDURE TRAY) ×1 IMPLANT
KIT TURNOVER KIT B (KITS) ×1 IMPLANT
NDL 25GX 5/8IN NON SAFETY (NEEDLE) ×1 IMPLANT
NDL PRECISIONGLIDE 27X1.5 (NEEDLE) IMPLANT
NEEDLE 25GX 5/8IN NON SAFETY (NEEDLE) ×1
NEEDLE PRECISIONGLIDE 27X1.5 (NEEDLE)
NS IRRIG 1000ML POUR BTL (IV SOLUTION) ×1 IMPLANT
PAD ARMBOARD 7.5X6 YLW CONV (MISCELLANEOUS) ×2 IMPLANT
PENCIL FOOT CONTROL (ELECTRODE) ×1 IMPLANT
SUT CHROMIC 4 0 P 3 18 (SUTURE) ×1 IMPLANT
SUT ETHILON 4 0 PS 2 18 (SUTURE) ×1 IMPLANT
SUT NYLON ETHILON 5-0 P-3 1X18 (SUTURE) ×1 IMPLANT
SUT SILK 3-0 RB1 30XBRD (SUTURE) ×1
SUT SILK 4 0 REEL (SUTURE) ×1 IMPLANT
SUT VIC AB 4-0 PS2 27 (SUTURE) IMPLANT
SUTURE SILK 3-0 RB1 30XBRD (SUTURE) IMPLANT
SWAB COLLECTION DEVICE MRSA (MISCELLANEOUS) IMPLANT
SWAB CULTURE ESWAB REG 1ML (MISCELLANEOUS) IMPLANT
SYR BULB IRRIG 60ML STRL (SYRINGE) IMPLANT
SYR TB 1ML LUER SLIP (SYRINGE) IMPLANT
TAPE CLOTH SURG 4X10 WHT LF (GAUZE/BANDAGES/DRESSINGS) IMPLANT
TOWEL GREEN STERILE FF (TOWEL DISPOSABLE) ×1 IMPLANT
TRAY ENT MC OR (CUSTOM PROCEDURE TRAY) ×1 IMPLANT
WATER STERILE IRR 1000ML POUR (IV SOLUTION) ×1 IMPLANT
YANKAUER SUCT BULB TIP NO VENT (SUCTIONS) IMPLANT

## 2023-10-03 NOTE — H&P (Signed)
ALEE ROTHKOPF is an 83 y.o. female.    Chief Complaint:  concern for temporal arteritis   HPI: Patient presents today for planned elective procedure. She denies any interval change in history since office visit on 08/28/23.   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 screening colonoscopy in 10 years 2. She should follow a high fiber diet She has been given a hand out on high fiber diverticulosois and hemorrhoids   COLONOSCOPY N/A 02/07/2018   Procedure: COLONOSCOPY;  Surgeon: West Bali, MD;  [facility];  Service: Endoscopy;  Laterality: N/A;  1:00pm   CYSTECTOMY     Left breast   ESOPHAGOGASTRODUODENOSCOPY     LOV:FIEPPIRJJOA appearing Schatzki's ring, possible cervical esophageal web, both dilated with passage of a Maloney dilator/small hiatal hernia   ESOPHAGOGASTRODUODENOSCOPY N/A 10/25/2015   Procedure: ESOPHAGOGASTRODUODENOSCOPY (EGD);  Surgeon: West Bali, MD;  [facility];  Service: Endoscopy;  Laterality: N/A;  1215pm   ESOPHAGOGASTRODUODENOSCOPY N/A 10/24/2019   Schatzki's ring s/p dilation and single medium firm, probable submucosal nodule without bleeding. Medium sized hiatal hernia, normal duodenum.    ESOPHAGOGASTRODUODENOSCOPY N/A 04/05/2020   10  mm leiomyoma and capsule study without obvious lesion or bleeding.   EUS  11/2019   Dr. Dulce Sellar: EUS completed March 2021 with likely leiomyoma, no other concerns. 10 mm by 10 mm.    GIVENS CAPSULE STUDY N/A 04/06/2020   Procedure: GIVENS CAPSULE STUDY;  Surgeon: Malissa Hippo, MD;  [facility];  Service: Endoscopy;  Laterality: N/A;   KNEE ARTHROPLASTY Left 2007   KNEE ARTHROPLASTY Right 2009   POLYPECTOMY  02/07/2018   Procedure: POLYPECTOMY;  Surgeon: West Bali, MD;  [facility];  Service: Endoscopy;;  ascending colon, descending, sigmoid   SAVORY DILATION N/A 10/25/2015   Procedure: SAVORY DILATION;  Surgeon: West Bali, MD;  [facility];  Service: Endoscopy;  Laterality: N/A;   SAVORY DILATION N/A 10/24/2019   Procedure: SAVORY DILATION;  Surgeon: West Bali, MD;  [facility];  Service: Endoscopy;  Laterality: N/A;   THYROIDECTOMY     Tumor   05/2010   Left breast under nipple   VAGINAL HYSTERECTOMY     Partial   VESICOVAGINAL FISTULA CLOSURE W/ TAH     fibroid tumors    Family History  Problem Relation Age of Onset   Cancer Brother        Prostate    Social History:  reports that she quit smoking about 30 years ago. Her smoking use included cigarettes. She started smoking about 40 years ago. She has a 15  pack-year smoking history. She has never used smokeless tobacco. She reports that she does not drink alcohol and does not use drugs.  Allergies:  Allergies  Allergen Reactions   Aricept [Donepezil Hcl]     Sleep deprived   Hydrocodone Nausea Only   Lisinopril Other (See Comments)    Angio edema   Naprosyn [Naproxen] Nausea Only   Oxycodone     GI Intolerance   Rofecoxib     Unknown reaction    Medications Prior to Admission  Medication Sig Dispense Refill   acetaminophen (TYLENOL) 500 MG tablet Take 1,000 mg by mouth 2 (two) times daily as needed for headache or moderate pain (pain score 4-6).      amLODipine (NORVASC) 10 MG tablet Take 1 tablet (10 mg total) by mouth daily. 6 tablet 0   Ascorbic Acid (VITAMIN C) 1000 MG tablet Take 1,000 mg by mouth daily.     BREO ELLIPTA 200-25 MCG/ACT AEPB USE 1 INHALATION BY MOUTH DAILY 180 each 0   Cholecalciferol (TRUE VITAMIN D3) 250 MCG (10000 UT) CAPS Take 10,000 Units by mouth daily.     cyanocobalamin (VITAMIN B12) 1000 MCG tablet Take 1,000 mcg by mouth daily.     magnesium oxide (MAG-OX) 400 (240 Mg) MG tablet Take 400 mg by mouth daily.     Misc Natural Products (TURMERIC CURCUMIN) CAPS Take 1 capsule by mouth daily.     Multiple Vitamins-Minerals (MULTIVITAMIN WOMENS 50+ ADV PO) Take 1 tablet by mouth daily.     OVER THE COUNTER MEDICATION Take 1 tablet by mouth daily. Viciscal for Hair Growth     Polyethylene Glycol 400 (BLINK TEARS) 0.25 % SOLN Place 2 drops into both eyes as needed (dry eyes).     potassium chloride (KLOR-CON M) 10 MEQ tablet TAKE 1 TABLET BY MOUTH DAILY 90 tablet 3   rosuvastatin (CRESTOR) 20 MG tablet TAKE 1 TABLET BY MOUTH ON MONDAYS AND FRIDAYS 26 tablet 4   cloNIDine (CATAPRES) 0.2 MG tablet Take 1 tablet (0.2 mg total) by mouth 2 (two) times daily. 60 tablet 3    No results found for this or any previous visit (from the past 48 hours). No results found.  ROS: Review of Systems  Constitutional:  Negative for chills and fever.  HENT:  Negative for ear pain.   Respiratory:  Negative for cough.   Cardiovascular:  Negative for chest pain.  Gastrointestinal:  Negative for nausea and vomiting.  Genitourinary:  Negative for dysuria.  Musculoskeletal:  Negative for neck pain.  Skin:  Negative for rash.  Neurological:  Positive for headaches. Negative for dizziness.  Psychiatric/Behavioral:  Negative for memory loss.     There were no vitals taken for this visit.  PHYSICAL EXAM: Physical Exam Constitutional:      General: She is not in acute distress. HENT:     Head: Normocephalic and atraumatic.     Nose:  Nose normal.     Mouth/Throat:     Mouth: Mucous membranes are moist.  Eyes:     Extraocular Movements: Extraocular movements intact.     Pupils: Pupils are equal, round, and reactive to light.  Cardiovascular:     Rate and Rhythm: Normal rate.  Pulmonary:     Effort: No respiratory distress.     Breath sounds: No stridor. No wheezing.  Abdominal:     Palpations: Abdomen is soft.  Musculoskeletal:        General: No swelling. Normal range of motion.  Cervical back: No rigidity.  Skin:    General: Skin is warm and dry.  Neurological:     Mental Status: She is oriented to person, place, and time.  Psychiatric:        Mood and Affect: Mood normal.       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

## 2023-10-03 NOTE — Anesthesia Preprocedure Evaluation (Addendum)
Anesthesia Evaluation  Patient identified by MRN, date of birth, ID band Patient awake    Reviewed: Allergy & Precautions, NPO status , Patient's Chart, lab work & pertinent test results  Airway Mallampati: II  TM Distance: >3 FB Neck ROM: Full    Dental  (+) Teeth Intact, Dental Advisory Given   Pulmonary former smoker   breath sounds clear to auscultation       Cardiovascular hypertension, Pt. on medications + dysrhythmias Atrial Fibrillation  Rhythm:Regular Rate:Normal     Neuro/Psych negative neurological ROS  negative psych ROS   GI/Hepatic Neg liver ROS,GERD  ,,  Endo/Other  negative endocrine ROS    Renal/GU negative Renal ROS     Musculoskeletal  (+) Arthritis ,    Abdominal   Peds  Hematology negative hematology ROS (+)   Anesthesia Other Findings   Reproductive/Obstetrics                             Anesthesia Physical Anesthesia Plan  ASA: 3  Anesthesia Plan: MAC   Post-op Pain Management: Minimal or no pain anticipated   Induction: Intravenous  PONV Risk Score and Plan: 3 and Ondansetron, Dexamethasone, Midazolam and Propofol infusion  Airway Management Planned: Natural Airway and Nasal Cannula  Additional Equipment: None  Intra-op Plan:   Post-operative Plan:   Informed Consent: I have reviewed the patients History and Physical, chart, labs and discussed the procedure including the risks, benefits and alternatives for the proposed anesthesia with the patient or authorized representative who has indicated his/her understanding and acceptance.       Plan Discussed with: CRNA  Anesthesia Plan Comments:        Anesthesia Quick Evaluation

## 2023-10-03 NOTE — Op Note (Signed)
OPERATIVE REPORT  PREOPERATIVE DIAGNOSES: 1. 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 Right temporal artery biopsy   SURGEON:  Ashok Croon, MD  ASSISTANT: None  ANESTHESIA: MAC  BLOOD LOSS: Minimal.  COMPLICATIONS: None.  CONDITION: Stable to PACU.  INDICATIONS AND CONSENT: Melissa Barajas is a 83 y.o. who presented in consultation to our Otolaryngology clinic with concerns for temporal arteritis. The above procedure was offered to our patient. The patient agreed to proceed after discussion of the risks, benefits, complications, alternatives and the standard informed consent forms were obtained preoperatively.  DESCRIPTION OF PROCEDURE: Melissa Barajas was brought to the operating suite on 10/03/2023 after being properly identified in the preoperative area. They were transferred to the operating table and placed in the supine position. After gentle induction of sedation was performed by the Anesthesia team, the head of the bed was rotated 90 degrees. A surgeon initiated time out was performed. The patient was prepped and draped and the course of a right superficial temporal artery was identified using palpation and a hand-held Doppler.  A local anesthesia administered in the form of 1% lidocaine with 1:100,000 parts epinephrine around the course of the right superficial temporal artery. Once there was adequate time for the anesthetic and vasoconstrictive agents to work, a 15 blade scalpel was used to make an incision along the course of the vessel in the right temporal region just behind the hairline. Dissection was then carried out to the superficial layer of the deep temporal fascia.  Gentle dissection was carried out once the vessel was identified through the fascial layer.  We then identified and clamped any tributaries and tied off the proximal and distal aspect of  the vessel that was intended for biopsy using 3-0 silk sutures.  We then divided the proximal the distal end of the superficial temporal artery, and the specimen was submitted for permanent histopathology.  The wound was then irrigated and hemostasis was achieved using bipolar cautery.  The incision was then closed using 4-0 Vicryl for deep sutures and Monocryl to approximate skin.  Dermabond was applied along the incision.  This completed the procedure. The patient tolerated the procedure well without any apparent complications. All counts were correct x2 at the conclusion of the procedure. The patient was turned over to Anesthesia, and transported to the PACU in stable condition. I was present and actively participated in the entire case.

## 2023-10-03 NOTE — Addendum Note (Signed)
Addendum  created 10/03/23 1327 by Camillia Herter, CRNA   Intraprocedure Meds edited

## 2023-10-03 NOTE — Discharge Instructions (Addendum)
Post-op Instructions: Temporal Artery Biopsy  After a temporal artery biopsy, you should keep the biopsy site clean and dry, avoid strenuous activity, and take any prescribed medication. You should also expect some swelling and bruising.   Aftercare instructions: Keep the site clean and dry: Keep the dressing on for 24 hours, then shower normally and pat the area dry.   Avoid strenuous activity: Avoid heavy lifting, running, or strenuous exercise for 4 weeks.   Take medication: Take any prescribed medication as directed, including antibiotics and anti-inflammatory medications like steroids.   Follow up with your doctor: Schedule a follow-up appointment with your doctor to discuss the results of the biopsy.   Treat swelling and bruising: Use cold compresses and elevate your head to relieve discomfort. Bruising may take several weeks to go away.   Don't scratch or pull scabs: Scabs help your wound heal, so try not to pick at them.   Don't use creams, powders, or perfume: These can irritate the skin while your wound is healing.   What to look out for: Signs of infection Seek medical attention if you experience severe pain, excessive bleeding, or signs of infection.  Stitches If you have stitches, they should fall off or dissolve on their own within 7-10 days. If you can still see them after this time, contact your doctor to have them removed.

## 2023-10-03 NOTE — Anesthesia Postprocedure Evaluation (Signed)
Anesthesia Post Note  Patient: SHAYLE AGE  Procedure(s) Performed: BIOPSY TEMPORAL ARTERY (Right)     Patient location during evaluation: PACU Anesthesia Type: MAC Level of consciousness: awake and alert Pain management: pain level controlled Vital Signs Assessment: post-procedure vital signs reviewed and stable Respiratory status: spontaneous breathing, nonlabored ventilation, respiratory function stable and patient connected to nasal cannula oxygen Cardiovascular status: stable and blood pressure returned to baseline Postop Assessment: no apparent nausea or vomiting Anesthetic complications: no  No notable events documented.  Last Vitals:  Vitals:   10/03/23 1108 10/03/23 1115  BP: (!) 142/68 (!) 130/56  Pulse: (!) 57 (!) 56  Resp: 12 15  Temp: (!) 35.8 C   SpO2: 100% 100%    Last Pain:  Vitals:   10/03/23 1115  TempSrc:   PainSc: 0-No pain                 Shelton Silvas

## 2023-10-03 NOTE — Transfer of Care (Signed)
Immediate Anesthesia Transfer of Care Note  Patient: Melissa Barajas  Procedure(s) Performed: BIOPSY TEMPORAL ARTERY (Right)  Patient Location: PACU  Anesthesia Type:MAC  Level of Consciousness: awake, alert , and oriented  Airway & Oxygen Therapy: Patient Spontanous Breathing and Patient connected to nasal cannula oxygen  Post-op Assessment: Report given to RN and Post -op Vital signs reviewed and stable  Post vital signs: Reviewed and stable  Last Vitals:  Vitals Value Taken Time  BP 130/56 10/03/23 1115  Temp 35.8 C 10/03/23 1108  Pulse 50 10/03/23 1121  Resp 14 10/03/23 1121  SpO2 100 % 10/03/23 1121  Vitals shown include unfiled device data.  Last Pain:  Vitals:   10/03/23 1115  TempSrc:   PainSc: 0-No pain         Complications: No notable events documented.

## 2023-10-04 ENCOUNTER — Encounter (HOSPITAL_COMMUNITY): Payer: Self-pay | Admitting: Otolaryngology

## 2023-10-04 ENCOUNTER — Telehealth (INDEPENDENT_AMBULATORY_CARE_PROVIDER_SITE_OTHER): Payer: Self-pay

## 2023-10-04 LAB — SURGICAL PATHOLOGY

## 2023-10-04 NOTE — Telephone Encounter (Signed)
Spoke with patient and let her know the biopsy was normal and to follow up with Dr Gerda Diss

## 2023-10-05 ENCOUNTER — Telehealth: Payer: Self-pay | Admitting: Family Medicine

## 2023-10-05 NOTE — Telephone Encounter (Signed)
Please see result note, pt informed and would like to proceed with plan for headaches which are unchanged.

## 2023-10-05 NOTE — Telephone Encounter (Signed)
Nurses Please let the patient know that her biopsy of the temporal artery showed evidence of previous inflammation but appears to be healed now.  Let her know it is important for me to know how her headaches have been doing recently? Please gather that information and then let her know that I will be connecting with a specialist regarding these findings to see if she needs to be on any additional medicine  Also obviously if she is having significant issues or problems with headaches we will be bringing her back sooner than her next scheduled follow-up visit  Please gather the above information and then send back to me thank you

## 2023-10-11 ENCOUNTER — Ambulatory Visit: Payer: Medicare Other | Admitting: Family Medicine

## 2023-10-15 ENCOUNTER — Other Ambulatory Visit: Payer: Self-pay

## 2023-10-15 DIAGNOSIS — R519 Headache, unspecified: Secondary | ICD-10-CM

## 2023-10-17 ENCOUNTER — Telehealth: Payer: Self-pay

## 2023-10-17 ENCOUNTER — Other Ambulatory Visit: Payer: Self-pay

## 2023-10-17 ENCOUNTER — Encounter (INDEPENDENT_AMBULATORY_CARE_PROVIDER_SITE_OTHER): Payer: Self-pay | Admitting: Otolaryngology

## 2023-10-17 ENCOUNTER — Ambulatory Visit (INDEPENDENT_AMBULATORY_CARE_PROVIDER_SITE_OTHER): Payer: Medicare Other | Admitting: Otolaryngology

## 2023-10-17 VITALS — BP 159/75 | HR 85

## 2023-10-17 DIAGNOSIS — R519 Headache, unspecified: Secondary | ICD-10-CM

## 2023-10-17 DIAGNOSIS — M26629 Arthralgia of temporomandibular joint, unspecified side: Secondary | ICD-10-CM | POA: Diagnosis not present

## 2023-10-17 DIAGNOSIS — F458 Other somatoform disorders: Secondary | ICD-10-CM

## 2023-10-17 DIAGNOSIS — G8929 Other chronic pain: Secondary | ICD-10-CM

## 2023-10-17 NOTE — Progress Notes (Signed)
 ENT Progress Note:  Update 10/17/23  Discussed the use of AI scribe software for clinical note transcription with the patient, who gave verbal consent to proceed.  History of Present Illness   Melissa Barajas is an 83 year old female who presents for post-op f/u, 2 weeks after R temporal artery biopsy 2/2 hx of headaches. Pathology was negative for temporal arteritis.   She experiences intermittent headaches that have improved since her surgery. The pain is described as 'comes and goes.' A neurosurgeon ruled out an aneurysm as a cause of her headaches, and she has been referred to neurology for further evaluation of her chronic headaches.  No issues at the incision site.   She has considered that wearing a wig might contribute to her headaches, noting that she only wears it when going out and not at home unless expecting visitors.      Records reviewed:  Initial Evaluation Reason for Consult: right sided headache concern for temporal arteritis    HPI: Discussed the use of AI scribe software for clinical note transcription with the patient, who gave verbal consent to proceed.  History of Present Illness   The patient is an 66 yoF, with a history of thyroid  surgery (she is unsure if it was partial thyroidectomy vs total) and a brain aneurysm, presents with a chief complaint of persistent pain around the right temple area, radiating down the side of the eye and towards the ear. The pain, described as an ache, is intermittent and has been waking the patient up at night. The patient also reports episodes of blurry vision and dizziness, with one significant episode lasting approximately four hours, during which she experienced difficulty walking in a straight line and had a sensation of the room moving. The patient also reported feeling unwell after this episode.  The patient has a history of teeth grinding, but denies that the current symptoms are similar to those experienced when she has TMJ pain.  She has tried a mouth guard in the past but found it uncomfortable. The patient has no history of headaches, migraines, autoimmune conditions, or vasculitis. She has been a non-smoker since 1989.  The patient was previously treated with a five-day course of oral steroids, which initially provided some relief. However, on the fourth day of the treatment, the patient reported a return of the right temple pain/headache. The patient's primary care doctor referred him for further investigation due to the persistent pain, elevated inflammatory markers, and the potential risk of temporal arteritis.        Records Reviewed:  Office visit by PCP Patient arrives for a follow up on headaches. Patient states she is feeling better Patient relates headaches in the right temporal region.  Previous MRI reviewed and lab work have been completed [employer] recently and was treated with prednisone  Seeing dramatic improvement with prednisone  No headaches Feeling much better A/P Headaches over the past several months in the right temporal region Some tenderness CRP slightly elevated but sed rate was normal Patient improved dramatically with prednisone  Need to rule out temporal arteritis Will connect with ENT regarding possibility of more urgent workup/temporal artery biopsy    Past Medical History:  Diagnosis Date   Arthritis    Atrial fibrillation [facility])    Remote prior history   Collagen vascular disease (HCC)    DIVERTICULOSIS OF COLON 08/25/2010   Qualifier: Diagnosis of  By: Mathew Osier     GERD (gastroesophageal reflux disease)    HEMORRHOIDS, INTERNAL 08/25/2010  Qualifier: Diagnosis of  By: Mathew Osier     Hypercholesteremia    Hypertension    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 screening colonoscopy in 10 years 2. She should follow a high fiber diet She has been given a hand out on high fiber diverticulosois and hemorrhoids   COLONOSCOPY N/A 02/07/2018   Procedure: COLONOSCOPY;  Surgeon: Harvey Margo CROME, MD;  [facility];  Service: Endoscopy;  Laterality: N/A;  1:00pm   CYSTECTOMY     Left breast   ESOPHAGOGASTRODUODENOSCOPY     MFM:Wnwrmpuprjo appearing Schatzki's ring, possible cervical esophageal web, both dilated with passage of a Maloney dilator/small hiatal hernia   ESOPHAGOGASTRODUODENOSCOPY N/A 10/25/2015   Procedure: ESOPHAGOGASTRODUODENOSCOPY (EGD);  Surgeon: Margo CROME Harvey, MD;  [facility];  Service: Endoscopy;  Laterality: N/A;  1215pm   ESOPHAGOGASTRODUODENOSCOPY N/A 10/24/2019   Schatzki's ring s/p dilation and single medium firm, probable submucosal nodule without bleeding. Medium sized hiatal hernia, normal duodenum.    ESOPHAGOGASTRODUODENOSCOPY N/A 04/05/2020   10 mm leiomyoma and capsule study without obvious lesion or bleeding.   EUS  11/2019   Dr. Burnette: EUS completed March 2021 with likely leiomyoma, no other concerns. 10 mm by 10 mm.    GIVENS CAPSULE STUDY N/A 04/06/2020   Procedure: GIVENS CAPSULE STUDY;  Surgeon: Golda Claudis PENNER, MD;  [facility];  Service: Endoscopy;  Laterality: N/A;   KNEE ARTHROPLASTY Left 2007   KNEE ARTHROPLASTY Right 2009   POLYPECTOMY  02/07/2018   Procedure: POLYPECTOMY;  Surgeon: Harvey Margo CROME, MD;  [facility];  Service: Endoscopy;;  ascending colon, descending, sigmoid   SAVORY DILATION N/A 10/25/2015   Procedure: SAVORY DILATION;  Surgeon: Margo CROME Harvey, MD;  [facility];  Service:  Endoscopy;  Laterality: N/A;   SAVORY DILATION N/A 10/24/2019   Procedure: SAVORY DILATION;  Surgeon: Harvey Margo CROME, MD;  [facility];  Service: Endoscopy;  Laterality: N/A;   THYROIDECTOMY     Tumor   05/2010   Left breast under nipple   VAGINAL HYSTERECTOMY     Partial   VESICOVAGINAL FISTULA CLOSURE W/ TAH     fibroid tumors    Family History  Problem Relation Age of Onset   Cancer Brother        Prostate    Social History:  reports that she quit smoking about 30 years ago. Her smoking use included cigarettes. She started smoking about 40 years ago. She has a 15 pack-year smoking history. She has never used smokeless tobacco. She reports that she does not drink alcohol and does not use drugs.  Allergies:  Allergies  Allergen Reactions   Aricept  [Donepezil  Hcl]     Sleep deprived   Hydrocodone Nausea Only   Lisinopril Other (See Comments)    Angio edema   Naprosyn [Naproxen] Nausea Only   Oxycodone      GI Intolerance  Rofecoxib     Unknown reaction    Medications: I have reviewed the patient's current medications.  The PMH, PSH, Medications, Allergies, and SH were reviewed and updated.  ROS: Constitutional: Negative for fever, weight loss and weight gain. Cardiovascular: Negative for chest pain and dyspnea on exertion. Respiratory: Is not experiencing shortness of breath at rest. Gastrointestinal: Negative for nausea and vomiting. Neurological: Negative for headaches. Psychiatric: The patient is not nervous/anxious  Blood pressure (!) 159/75, pulse 85, SpO2 96%.  PHYSICAL EXAM:  Exam: General: Well-developed, well-nourished Respiratory Respiratory effort: Equal inspiration and expiration without stridor Cardiovascular Peripheral Vascular: Warm extremities with equal color/perfusion Eyes: No nystagmus with equal extraocular motion bilaterally Neuro/Psych/Balance: Patient oriented to person, place, and time; Appropriate mood and affect; Gait is  intact with no imbalance; Cranial nerves I-XII are intact Head and Face Inspection: Normocephalic and atraumatic without mass or lesion. Right temporal area with well-healed incision, no edema or erythema Facial Strength: Facial motility symmetric and full bilaterally ENT Pinna: External ear intact and fully developed External canal: Canal is patent with intact skin External Nose: No scar or anatomic deformity Neck Neck and Trachea: Midline trachea without mass or lesion Skin: evidence of alopecia   Procedure: none   Studies Reviewed: Labs   MRI/Angio brain  CLINICAL DATA:  Aneurysm, cerebral, nonruptured. Rt side headache x 2 years   EXAM: MRA HEAD WITHOUT CONTRAST   TECHNIQUE: Angiographic images of the Circle of Willis were acquired using MRA technique without intravenous contrast.   COMPARISON:  [facility]A 09/12/2022.   FINDINGS: Motion limited study.   Anterior circulation: Motion limited study. 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 Bilateral intracranial ICAs, MCAs, and ACAs are patent without proximal high-grade stenosis.   Posterior circulation: Visualized intradural vertebral arteries, basilar artery and bilateral posterior cerebral arteries are patent without proximal high-grade stenosis.   IMPRESSION: 1. Motion limited study. 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 2. No large vessel occlusion or proximal high-grade stenosis.    Assessment/Plan: No diagnosis found.   Assessment and Plan    Concern for Temporal Arteritis Suspected temporal arteritis due to persistent right temporal pain, potential episode of vision changes, and elevated inflammatory markers, with sx relief after oral steroids given by PCP. Pain extends to the side of the eye and ear, exacerbated at night. Symptoms improved with a short course of oral  steroids but recurred post-treatment. Discussed temporal artery biopsy procedure and risks, including bleeding, hematoma, scar, nerve damage, infection, and potential for hypertrophic scar or keloid. Emphasized biopsy importance to rule out the diagnosis that can cause vision loss. Other potential causes of her sx include trigeminal neuralgia due to episodes of sharp, shooting pain along the temple, midface, and lower face. 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  - Schedule temporal artery biopsy at [facility] - Advised her to contact primary care physician if symptoms worsen or significant vision changes occur - will need pre-op clearance from PCP - not on blood thinners per report  Co-morbidities:  No history of autoimmune conditions, vasculitis, stroke, myocardial infarction, pulmonary conditions, or significant anesthesia complications. Former smoker, quit in 1989.   Follow-up - schedule for temporal artery biopsy - Advise follow-up with primary care physician if symptoms worsen or vision changes occur.  - will need to get clearance from PCP prior to surgery    Update  10/17/23 Assessment and Plan    Headache Intermittent headaches with variable intensity. Improvement noted post-biopsy. Biopsy confirmed no temporal arteritis. Potential triggers such as tight headwear discussed. Referred to neurology for specialized management and workup of headache already sent by PCP. - Advise alternative headwear to reduce potential triggers - See neurology  Post-biopsy follow-up Biopsy confirmed no temporal arteritis. Incision site healed without complications. - No further action required for biopsy site  Follow-up - Follow up with primary care physician, Dr. Alphonsa - Await neurology appointment for headache management.         Elena Larry, MD Otolaryngology [facility] Phone: [phone] Fax: [phone]    10/17/2023, 6:01 PM

## 2023-10-17 NOTE — Telephone Encounter (Signed)
 Brooke from [facility] contacted the office and states Dr. Fairy Homer would like a call back from Dr. Rodell Citrin regarding this patient. We have not seen this patient here in the office. The call back number is [phone]. Please advise.

## 2023-10-17 NOTE — Telephone Encounter (Signed)
 LMOM to schedule npt appointment with Dr. Rodell Citrin

## 2023-10-17 NOTE — Telephone Encounter (Signed)
 Please arrange to schedule this patient sometime in next 1 month - okay to overbook if nothing opens up.  FYI- I spoke with Dr. Alphonsa about this patient. I recommended starting her on treatment for most likely temporal arteritis. Prednisone  40 mg then tapering by 10 mg increment after each 2 weeks if tolerating without new symptoms.

## 2023-10-22 ENCOUNTER — Telehealth: Payer: Self-pay | Admitting: Family Medicine

## 2023-10-22 NOTE — Telephone Encounter (Signed)
 Nurses Please touch base with Melissa Barajas Let her know that I discussed her most recent biopsy with rheumatology This was in regards to her headaches That the ENT doctor stated they did not see any active signs of temporal arteritis but the biopsy does show previous findings of temporal arteritis which runs a risk of coming back.  This is a complicated issue I would like to do an office visit with the patient within the next 7 or 8 days to discuss all of this in detail-rheumatology has some recommendations that I would like to discuss with her If she is not able to come to the office visit we can do a video visit if she wishes  If possible office visit with her this week and one of my open slots thank you

## 2023-10-23 ENCOUNTER — Encounter: Payer: Medicare Other | Admitting: Internal Medicine

## 2023-10-23 NOTE — Telephone Encounter (Signed)
Patient notified of provider's recommendations and scheduled follow up office visit 10/24/23 at 1:20pm with Dr Lorin Picket

## 2023-10-24 ENCOUNTER — Ambulatory Visit (INDEPENDENT_AMBULATORY_CARE_PROVIDER_SITE_OTHER): Payer: Medicare Other | Admitting: Family Medicine

## 2023-10-24 ENCOUNTER — Encounter: Payer: Self-pay | Admitting: Family Medicine

## 2023-10-24 VITALS — BP 147/81 | HR 75 | Temp 97.2°F | Ht 66.0 in | Wt 186.0 lb

## 2023-10-24 DIAGNOSIS — M316 Other giant cell arteritis: Secondary | ICD-10-CM

## 2023-10-24 MED ORDER — PREDNISONE 10 MG PO TABS: ORAL_TABLET | ORAL | 0 refills | Status: DC

## 2023-10-24 MED ORDER — CLONIDINE HCL 0.2 MG PO TABS: 0.2000 mg | ORAL_TABLET | Freq: Two times a day (BID) | ORAL | 2 refills | Status: DC

## 2023-10-24 NOTE — Progress Notes (Signed)
   Subjective:    Patient ID: Melissa Barajas, female    DOB: 03-15-1941, 83 y.o.   MRN: [id number]  HPI [name] is a very nice patient with underlying history of hypertension Over the past year she has had reoccurring headaches in the right temple that this past fall through December became very persistent She was seen and evaluated treated with a short course of prednisone and referred to ENT for temporal arteritis biopsy The biopsy did get delayed some due to scheduling issues When the biopsy was completed it shows the possibility of healed arteritis The patient is still having intermittent headaches every few days in the temporal region Previously within the past 2 weeks have communicated to her situation with Dr. Dimple Casey with rheumatology They will be seeing her next week for evaluation Patient was brought in today for a face-to-face discussion regarding temporal arteritis, the risk of progression without treatment, the risk of recurrence without treatment The patient does agree to going with the prednisone Patient has a history of prediabetes-she was counseled to minimize sugars and simple carbohydrates while on prednisone  Review of Systems     Objective:   Physical Exam  Neurologic grossly normal Subjective temporal headaches on the right side intermittently ongoing      Assessment & Plan:  HTN relatively stable for age monitor periodically will see her back in 3 months time  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 Dimple Casey next week they will map out for her a tapering dose over multiple weeks  We will recheck her again in approximately 3 months All questions were answered

## 2023-10-28 NOTE — Progress Notes (Unsigned)
 Office Visit Note  Patient: Melissa Barajas             Date of Birth: 07-Jun-1941           MRN: [id number]             PCP: Babs Sciara, MD Referring: Babs Sciara, MD Visit Date: 10/29/2023 Occupation: @GUAROCC @  Subjective:  No chief complaint on file.   History of Present Illness: Melissa Barajas is a 83 y.o. female ***     Activities of Daily Living:  Patient reports morning stiffness for *** {minute/hour:19697}.   Patient {ACTIONS;DENIES/REPORTS:21021675::"Denies"} nocturnal pain.  Difficulty dressing/grooming: {ACTIONS;DENIES/REPORTS:21021675::"Denies"} Difficulty climbing stairs: {ACTIONS;DENIES/REPORTS:21021675::"Denies"} Difficulty getting out of chair: {ACTIONS;DENIES/REPORTS:21021675::"Denies"} Difficulty using hands for taps, buttons, cutlery, and/or writing: {ACTIONS;DENIES/REPORTS:21021675::"Denies"}  No Rheumatology ROS completed.   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   Urge incontinence 03/25/2013   Hyperlipidemia 08/25/2010   Essential hypertension 08/25/2010   GERD 08/25/2010    Past Medical History:  Diagnosis Date   Arthritis    Atrial fibrillation (HCC)    Remote prior history   Collagen vascular disease (HCC)    DIVERTICULOSIS OF COLON 08/25/2010   Qualifier: Diagnosis of  By: Rexene Alberts     GERD (gastroesophageal reflux disease)    HEMORRHOIDS, INTERNAL 08/25/2010   Qualifier: Diagnosis of  By: Rexene Alberts     Hypercholesteremia    Hypertension    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 screening colonoscopy in 10 years 2. She should follow a high fiber diet She has been given a hand out on high fiber diverticulosois and hemorrhoids   COLONOSCOPY N/A 02/07/2018   Procedure: COLONOSCOPY;  Surgeon: West Bali, MD;  [facility];  Service: Endoscopy;  Laterality: N/A;  1:00pm   CYSTECTOMY     Left breast   ESOPHAGOGASTRODUODENOSCOPY     NWG:[id number] appearing Schatzki's ring, possible cervical esophageal web, both dilated with passage of a Maloney dilator/small hiatal hernia   ESOPHAGOGASTRODUODENOSCOPY N/A 10/25/2015   Procedure: ESOPHAGOGASTRODUODENOSCOPY (EGD);  Surgeon: West Bali, MD;  [facility];  Service: Endoscopy;  Laterality: N/A;  1215pm   ESOPHAGOGASTRODUODENOSCOPY N/A 10/24/2019   Schatzki's ring s/p dilation and single medium firm, probable submucosal nodule without bleeding. Medium sized hiatal hernia, normal duodenum.    ESOPHAGOGASTRODUODENOSCOPY N/A 04/05/2020   10 mm leiomyoma and capsule study without obvious lesion or bleeding.   EUS  11/2019   Dr. Dulce Sellar: EUS completed March 2021 with likely leiomyoma, no other concerns. 10  mm by 10 mm.    GIVENS CAPSULE STUDY N/A 04/06/2020   Procedure: GIVENS CAPSULE STUDY;  Surgeon: Malissa Hippo, MD;  [facility];  Service: Endoscopy;  Laterality: N/A;   KNEE ARTHROPLASTY Left 2007   KNEE ARTHROPLASTY Right 2009   POLYPECTOMY  02/07/2018   Procedure: POLYPECTOMY;  Surgeon: West Bali, MD;  [facility];  Service: Endoscopy;;  ascending colon,  descending, sigmoid   SAVORY DILATION N/A 10/25/2015   Procedure: SAVORY DILATION;  Surgeon: West Bali, MD;  [facility];  Service: Endoscopy;  Laterality: N/A;   SAVORY DILATION N/A 10/24/2019   Procedure: SAVORY DILATION;  Surgeon: West Bali, MD;  [facility];  Service: Endoscopy;  Laterality: N/A;   THYROIDECTOMY     Tumor   05/2010   Left breast under nipple   VAGINAL HYSTERECTOMY     Partial   VESICOVAGINAL FISTULA CLOSURE W/ TAH     fibroid tumors   Social History   Social History Narrative   01/26/23: Patient is currently helping to care for her brother who has cancer.    Immunization History  Administered Date(s) Administered   Fluad Quad(high Dose 65+) 06/29/2021, 05/26/2022   Fluad Trivalent(High Dose 65+) 06/11/2023   Influenza Split 07/08/2013   Influenza, High Dose Seasonal PF 06/16/2019   Influenza,inj,Quad PF,6+ Mos 05/25/2014, 06/15/2015, 06/22/2016, 06/19/2017, 06/21/2018   Influenza,inj,quad, With Preservative 06/11/2017   Influenza-Unspecified 05/13/2011, 06/25/2020   PFIZER(Purple Top)SARS-COV-2 Vaccination 10/01/2019, 10/22/2019, 06/10/2020   Pneumococcal Conjugate-13 10/12/2014   Pneumococcal Polysaccharide-23 07/01/2008   Zoster, Live 07/01/2008     Objective: Vital Signs: There were no vitals taken for this visit.   Physical Exam   Musculoskeletal Exam: ***  CDAI Exam: CDAI Score: -- Patient Global: --; Provider Global: -- Swollen: --; Tender: -- Joint Exam 10/29/2023   No joint exam has been documented for this visit   There is currently no information documented on the homunculus. Go to the Rheumatology activity and complete the homunculus joint exam.  Investigation: No additional findings.  Imaging: No results found.  Recent Labs: Lab Results  Component Value Date   WBC 7.8 09/28/2023   HGB 14.5 09/28/2023   PLT 253 09/28/2023   NA 140 09/28/2023   K 4.2 09/28/2023   CL 103 09/28/2023   CO2  29 09/28/2023   GLUCOSE 107 (H) 09/28/2023   BUN 9 09/28/2023   CREATININE 0.75 09/28/2023   BILITOT 0.6 11/13/2022   ALKPHOS 100 11/13/2022   AST 22 11/13/2022   ALT 15 11/13/2022   PROT 7.2 11/13/2022   ALBUMIN 4.5 11/13/2022   CALCIUM 10.0 09/28/2023   GFRAA 81 08/24/2020    Speciality Comments: No specialty comments available.  Procedures:  No procedures performed Allergies: Aricept [donepezil hcl], Hydrocodone, Lisinopril, Naprosyn [naproxen], Oxycodone, and Rofecoxib   Assessment / Plan:     Visit Diagnoses: No diagnosis found.  Orders: No orders of the defined types were placed in this encounter.  No orders of the defined types were placed in this encounter.   Face-to-face time spent with patient was *** minutes. Greater than 50% of time was spent in counseling and coordination of care.  Follow-Up Instructions: No follow-ups on file.   Fuller Plan, MD  Note - This record has been created using [employer].  Chart creation errors have been sought, but may not always  have been located. Such creation errors do not reflect on  the standard  of medical care.

## 2023-10-29 ENCOUNTER — Encounter: Payer: Self-pay | Admitting: Internal Medicine

## 2023-10-29 ENCOUNTER — Ambulatory Visit: Payer: Medicare Other | Attending: Internal Medicine | Admitting: Internal Medicine

## 2023-10-29 VITALS — BP 133/84 | HR 61 | Resp 15 | Ht 65.5 in | Wt 185.4 lb

## 2023-10-29 DIAGNOSIS — M316 Other giant cell arteritis: Secondary | ICD-10-CM | POA: Diagnosis not present

## 2023-10-29 DIAGNOSIS — R002 Palpitations: Secondary | ICD-10-CM | POA: Diagnosis not present

## 2023-10-29 DIAGNOSIS — R1013 Epigastric pain: Secondary | ICD-10-CM

## 2023-10-29 DIAGNOSIS — Z7952 Long term (current) use of systemic steroids: Secondary | ICD-10-CM | POA: Diagnosis not present

## 2023-10-29 MED ORDER — PREDNISONE 10 MG PO TABS: ORAL_TABLET | ORAL | 0 refills | Status: AC

## 2023-10-29 MED ORDER — PANTOPRAZOLE SODIUM 40 MG PO TBEC: 40.0000 mg | DELAYED_RELEASE_TABLET | Freq: Every day | ORAL | 0 refills | Status: DC

## 2023-10-29 NOTE — Patient Instructions (Addendum)
 You can gradually taper the prednisone dose down each week by 5 mg: Week 1: 40 mg 2/19-2/25 Week 2: 35 mg 2/26-3/4 Week 3: 30 mg 3/5-3/11 Week 4: 25 mg 3/12-3/18 Week 5: 20 mg 3/19-3/25

## 2023-10-30 LAB — SEDIMENTATION RATE: Sed Rate: 19 mm/h (ref 0–30)

## 2023-10-30 LAB — C-REACTIVE PROTEIN: CRP: 3.4 mg/L (ref <8.0)

## 2023-10-30 NOTE — Progress Notes (Signed)
 Sedimentation rate and CRP are both normal.  So inflammation appears to be under control.  She can proceed with the gradual prednisone tapering as planned.

## 2023-11-29 NOTE — Progress Notes (Deleted)
 Office Visit Note  Patient: Melissa Barajas             Date of Birth: September 01, 1941           MRN: [id number]             PCP: Babs Sciara, MD Referring: Babs Sciara, MD Visit Date: 11/30/2023   Subjective:  No chief complaint on file.   History of Present Illness: Melissa Barajas is a 83 y.o. female here for follow up ***   Previous HPI 10/29/23 Melissa Barajas is an 83 year old female here for evaluation and management of temporal arteritis who presents with recurrent headaches with associated tests elevated sedimentation rate and abnormal TA biopsy. She is accompanied by her sister.   She has been experiencing daily headaches since last summer, beginning around August or September. The pain originates from her temple and radiates to her eye. Initially, the headaches occurred twice daily, approximately every six to seven hours, and were managed with extra strength Tylenol, providing relief for about six to seven hours. Over time, the frequency decreased to once a day. A biopsy on her head temporarily alleviated the headaches, but they have recently started to return, albeit less severe and not daily.   She is currently taking prednisone, 40 mg daily in the morning after eating, to manage her condition. Started since last week with Dr. Fletcher Anon office. She experiences side effects such as difficulty sleeping. She has a history of taking prednisone during a COVID-19 infection, which caused her to feel 'strung out'. Her sister is concerned about the high dose of prednisone and its potential side effects, including increased appetite, fluid retention, and elevated blood pressure. She acknowledges a tendency to crave sweets, which she is trying to manage.   She reportedly has a history of rheumatoid arthritis diagnosed at age 54, with symptoms including crooked fingers and occasional knee pain. She has two artificial knees and experiences swelling and pain in her feet, though these symptoms have  lessened since she stopped working. Her sister also reports having rheumatoid arthritis.   She reports waking up with chest pain in the past few days. She does have history of acid reflux, for which she takes pantoprazole 40 mg although this is not in her medicine list. She also mentions an irregular heartbeat and is supposed to be on medication for it, though she is unsure of the specifics. EKG checked in January of this year was nonspecific.   Biopsy Result 10/03/23 A. RIGHT TEMPORAL ARTERY, BIOPSY:  Segment of artery with very focal loss of the elastic layer suggestive of healed arteritis  Negative for fibrosis, inflammation and giant cells      Labs reviewed 05/2023 ESR 18   01/2023 ESR 60   11/2022 RF neg ESR 34   No Rheumatology ROS completed.   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   Urge incontinence 03/25/2013   Hyperlipidemia 08/25/2010   Essential hypertension 08/25/2010   GERD 08/25/2010    Past Medical History:  Diagnosis Date   Arthritis    Atrial fibrillation (HCC)  Remote prior history   Collagen vascular disease (HCC)    DIVERTICULOSIS OF COLON 08/25/2010   Qualifier: Diagnosis of  By: Rexene Alberts     GERD (gastroesophageal reflux disease)    HEMORRHOIDS, INTERNAL 08/25/2010   Qualifier: Diagnosis of  By: Rexene Alberts     Hypercholesteremia    Hypertension    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/03/2020   EGD and EGD [facility] 01/2020   Lipoma of arm    Left    Family History  Problem Relation Age of Onset   Liver disease Father     Arthritis Sister    Pulmonary disease Sister    Arthritis Brother    Liver cancer Brother    Cancer Brother        mouth cancer   Cancer Brother    Cancer Brother    Arthritis Son    Hypertension Son    Diabetes Son        pre-diabetes   High Cholesterol Son    Leukemia Son    Healthy Daughter    Healthy Daughter    Past Surgical History:  Procedure Laterality Date   ARTERY BIOPSY Right 10/03/2023   Procedure: BIOPSY TEMPORAL ARTERY;  Surgeon: Ashok Croon, MD;  Location: [facility];  Service: ENT;  Laterality: Right;   BIOPSY  10/24/2019   Procedure: BIOPSY;  Surgeon: West Bali, MD;  [facility];  Service: Endoscopy;;   BREAST BIOPSY Left    COLONOSCOPY  2009   SLF: 1. screening colonoscopy in 10 years 2. She should follow a high fiber diet She has been given a hand out on high fiber diverticulosois and hemorrhoids   COLONOSCOPY N/A 02/07/2018   Procedure: COLONOSCOPY;  Surgeon: West Bali, MD;  [facility];  Service: Endoscopy;  Laterality: N/A;  1:00pm   CYSTECTOMY     Left breast   ESOPHAGOGASTRODUODENOSCOPY     [id number] appearing Schatzki's ring, possible cervical esophageal web, both dilated with passage of a Maloney dilator/small hiatal hernia   ESOPHAGOGASTRODUODENOSCOPY N/A 10/25/2015   Procedure: ESOPHAGOGASTRODUODENOSCOPY (EGD);  Surgeon: West Bali, MD;  [facility];  Service: Endoscopy;  Laterality: N/A;  1215pm   ESOPHAGOGASTRODUODENOSCOPY N/A 10/24/2019   Schatzki's ring s/p dilation and single medium firm, probable submucosal nodule without bleeding. Medium sized hiatal hernia, normal duodenum.    ESOPHAGOGASTRODUODENOSCOPY N/A 04/05/2020   10 mm leiomyoma and capsule study without obvious lesion or bleeding.   EUS  11/2019   Dr. Dulce Sellar: EUS completed March 2021 with likely leiomyoma, no other concerns. 10 mm by 10 mm.    GIVENS CAPSULE STUDY N/A 04/06/2020   Procedure: GIVENS CAPSULE STUDY;  Surgeon:  Malissa Hippo, MD;  [facility];  Service: Endoscopy;  Laterality: N/A;   KNEE ARTHROPLASTY Left 2007   KNEE ARTHROPLASTY Right 2009   POLYPECTOMY  02/07/2018   Procedure: POLYPECTOMY;  Surgeon: West Bali, MD;  [facility];  Service: Endoscopy;;  ascending colon, descending, sigmoid   SAVORY DILATION N/A 10/25/2015   Procedure: SAVORY DILATION;  Surgeon: West Bali, MD;  [facility];  Service: Endoscopy;  Laterality: N/A;   SAVORY DILATION N/A 10/24/2019   Procedure: SAVORY DILATION;  Surgeon: West Bali, MD;  [facility];  Service: Endoscopy;  Laterality: N/A;   THYROIDECTOMY     Tumor   05/2010   Left breast under nipple   VAGINAL HYSTERECTOMY  Partial   VESICOVAGINAL FISTULA CLOSURE W/ TAH     fibroid tumors   Social History   Social History Narrative   01/26/23: Patient is currently helping to care for her brother who has cancer.    Immunization History  Administered Date(s) Administered   Fluad Quad(high Dose 65+) 06/29/2021, 05/26/2022   Fluad Trivalent(High Dose 65+) 06/11/2023   Influenza Split 07/08/2013   Influenza, High Dose Seasonal PF 06/16/2019   Influenza,inj,Quad PF,6+ Mos 05/25/2014, 06/15/2015, 06/22/2016, 06/19/2017, 06/21/2018   Influenza,inj,quad, With Preservative 06/11/2017   Influenza-Unspecified 05/13/2011, 06/25/2020   PFIZER(Purple Top)SARS-COV-2 Vaccination 10/01/2019, 10/22/2019, 06/10/2020   Pneumococcal Conjugate-13 10/12/2014   Pneumococcal Polysaccharide-23 07/01/2008   Zoster, Live 07/01/2008     Objective: Vital Signs: There were no vitals taken for this visit.   Physical Exam   Musculoskeletal Exam: ***  CDAI Exam: CDAI Score: -- Patient Global: --; Provider Global: -- Swollen: --; Tender: -- Joint Exam 11/30/2023   No joint exam has been documented for this visit   There is currently no information documented on the homunculus. Go to the Rheumatology activity  and complete the homunculus joint exam.  Investigation: No additional findings.  Imaging: No results found.  Recent Labs: Lab Results  Component Value Date   WBC 7.8 09/28/2023   HGB 14.5 09/28/2023   PLT 253 09/28/2023   NA 140 09/28/2023   K 4.2 09/28/2023   CL 103 09/28/2023   CO2 29 09/28/2023   GLUCOSE 107 (H) 09/28/2023   BUN 9 09/28/2023   CREATININE 0.75 09/28/2023   BILITOT 0.6 11/13/2022   ALKPHOS 100 11/13/2022   AST 22 11/13/2022   ALT 15 11/13/2022   PROT 7.2 11/13/2022   ALBUMIN 4.5 11/13/2022   CALCIUM 10.0 09/28/2023   GFRAA 81 08/24/2020    Speciality Comments: No specialty comments available.  Procedures:  No procedures performed Allergies: Aricept [donepezil hcl], Hydrocodone, Lisinopril, Naprosyn [naproxen], Oxycodone, and Rofecoxib   Assessment / Plan:     Visit Diagnoses: No diagnosis found.  ***  Orders: No orders of the defined types were placed in this encounter.  No orders of the defined types were placed in this encounter.    Follow-Up Instructions: No follow-ups on file.   Fuller Plan, MD  Note - This record has been created using [employer].  Chart creation errors have been sought, but may not always  have been located. Such creation errors do not reflect on  the standard of medical care.

## 2023-11-30 ENCOUNTER — Ambulatory Visit: Payer: Medicare Other | Attending: Internal Medicine | Admitting: Internal Medicine

## 2023-11-30 ENCOUNTER — Other Ambulatory Visit: Payer: Self-pay | Admitting: Family Medicine

## 2023-11-30 ENCOUNTER — Encounter: Payer: Self-pay | Admitting: Internal Medicine

## 2023-11-30 VITALS — BP 113/70 | HR 76 | Resp 14 | Ht 66.0 in | Wt 184.0 lb

## 2023-11-30 DIAGNOSIS — Z7952 Long term (current) use of systemic steroids: Secondary | ICD-10-CM | POA: Diagnosis not present

## 2023-11-30 DIAGNOSIS — M316 Other giant cell arteritis: Secondary | ICD-10-CM

## 2023-11-30 MED ORDER — PREDNISONE 5 MG PO TABS: ORAL_TABLET | ORAL | 0 refills | Status: AC

## 2023-11-30 NOTE — Progress Notes (Addendum)
 Office Visit Note  Patient: Melissa Barajas             Date of Birth: 10/31/1940           MRN: [id number]             PCP: Babs Sciara, MD Referring: Babs Sciara, MD Visit Date: 11/30/2023   Subjective:  Follow-up (Patient states she took her last two prednisone pills yesterday. Patient states her head is hurting more and more around her eye and temple. )   History of Present Illness:   Melissa Barajas is a 83 y.o. female here for follow up on her temporal HA. She was last seen in February and was having right temporal headaches that were sharp and improved after starting steroids. She started tapering her steroids and when she reached 3.5 tablets she started getting her headaches back. These are intermittent, worse in the morning or wake her up at night. They will get better during the rest of the day. They are dull. She denies them improving when she gets fresh air. She does state that her husband used to tell her that she will snore loudly, has woken up choking, is obese and has a history of hypertension. She denies having any rhinorrhea, lacrimation, or facial droop. Denies having any muscle weakness but endorses fatigue going up the stairs after starting her steroids. She used her breo ellipta not daily but now has been requiring daily. She has not had difficulty with repetitive movements, has not had difficulty getting up a chair, no difficulty getting up the stairs. She was using tylenol daily extra strength more than 1000mg  a day consistently before her headaches started. She has cut back on this and is now taking it intermittently.   Previous HPI 10/29/23 Melissa Barajas is an 83 year old female here for evaluation and management of temporal arteritis who presents with recurrent headaches with associated tests elevated sedimentation rate and abnormal TA biopsy. She is accompanied by her sister.   She has been experiencing daily headaches since last summer, beginning around August or  September. The pain originates from her temple and radiates to her eye. Initially, the headaches occurred twice daily, approximately every six to seven hours, and were managed with extra strength Tylenol, providing relief for about six to seven hours. Over time, the frequency decreased to once a day. A biopsy on her head temporarily alleviated the headaches, but they have recently started to return, albeit less severe and not daily.   She is currently taking prednisone, 40 mg daily in the morning after eating, to manage her condition. Started since last week with Dr. Fletcher Anon office. She experiences side effects such as difficulty sleeping. She has a history of taking prednisone during a COVID-19 infection, which caused her to feel 'strung out'. Her sister is concerned about the high dose of prednisone and its potential side effects, including increased appetite, fluid retention, and elevated blood pressure. She acknowledges a tendency to crave sweets, which she is trying to manage.   She reportedly has a history of rheumatoid arthritis diagnosed at age 59, with symptoms including crooked fingers and occasional knee pain. She has two artificial knees and experiences swelling and pain in her feet, though these symptoms have lessened since she stopped working. Her sister also reports having rheumatoid arthritis.   She reports waking up with chest pain in the past few days. She does have history of acid reflux, for which she takes  pantoprazole 40 mg although this is not in her medicine list. She also mentions an irregular heartbeat and is supposed to be on medication for it, though she is unsure of the specifics. EKG checked in January of this year was nonspecific.   Biopsy Result 10/03/23 A. RIGHT TEMPORAL ARTERY, BIOPSY:  Segment of artery with very focal loss of the elastic layer suggestive of healed arteritis  Negative for fibrosis, inflammation and giant cells      Labs reviewed  10/29/2023 ESR  19  05/2023 ESR 18   01/2023 ESR 60   11/2022 RF neg ESR 34   Review of Systems  Constitutional:  Negative for fatigue.  HENT:  Positive for mouth dryness. Negative for mouth sores.   Eyes:  Positive for dryness.  Respiratory:  Positive for shortness of breath.   Cardiovascular:  Negative for chest pain and palpitations.  Gastrointestinal:  Negative for blood in stool, constipation and diarrhea.  Endocrine: Negative for increased urination.  Genitourinary:  Positive for involuntary urination.  Musculoskeletal:  Positive for joint pain, gait problem, joint pain, joint swelling and morning stiffness. Negative for myalgias, muscle weakness, muscle tenderness and myalgias.  Skin:  Negative for color change, rash, hair loss and sensitivity to sunlight.  Allergic/Immunologic: Negative for susceptible to infections.  Neurological:  Positive for headaches. Negative for dizziness.  Hematological:  Negative for swollen glands.  Psychiatric/Behavioral:  Positive for sleep disturbance. Negative for depressed mood. The patient is not nervous/anxious.     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   Urge incontinence 03/25/2013   Hyperlipidemia 08/25/2010   Essential hypertension 08/25/2010   GERD 08/25/2010    Past Medical History:  Diagnosis Date   Arthritis    Atrial fibrillation (HCC)    Remote prior history   Collagen vascular disease (HCC)    DIVERTICULOSIS OF COLON 08/25/2010   Qualifier: Diagnosis of  By: Rexene Alberts     GERD (gastroesophageal reflux disease)    HEMORRHOIDS, INTERNAL  08/25/2010   Qualifier: Diagnosis of  By: Rexene Alberts     Hypercholesteremia    Hypertension    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/03/2020   EGD and EGD [facility] 01/2020   Lipoma of arm    Left    Family History  Problem Relation Age of Onset   Liver disease Father    Arthritis Sister    Pulmonary disease Sister    Arthritis Brother    Liver cancer Brother    Cancer Brother        mouth cancer   Cancer Brother    Cancer Brother    Arthritis Son    Hypertension Son    Diabetes Son        pre-diabetes   High Cholesterol Son    Leukemia Son    Healthy Daughter    Healthy Daughter    Past Surgical History:  Procedure Laterality Date   ARTERY BIOPSY Right 10/03/2023   Procedure: BIOPSY TEMPORAL ARTERY;  Surgeon: Ashok Croon, MD;  Location: [facility];  Service: ENT;  Laterality: Right;   BIOPSY  10/24/2019  Procedure: BIOPSY;  Surgeon: West Bali, MD;  [facility];  Service: Endoscopy;;   BREAST BIOPSY Left    COLONOSCOPY  2009   SLF: 1. screening colonoscopy in 10 years 2. She should follow a high fiber diet She has been given a hand out on high fiber diverticulosois and hemorrhoids   COLONOSCOPY N/A 02/07/2018   Procedure: COLONOSCOPY;  Surgeon: West Bali, MD;  [facility];  Service: Endoscopy;  Laterality: N/A;  1:00pm   CYSTECTOMY     Left breast   ESOPHAGOGASTRODUODENOSCOPY     [id number] appearing Schatzki's ring, possible cervical esophageal web, both dilated with passage of a Maloney dilator/small hiatal hernia   ESOPHAGOGASTRODUODENOSCOPY N/A 10/25/2015   Procedure: ESOPHAGOGASTRODUODENOSCOPY (EGD);  Surgeon: West Bali, MD;  [facility];  Service: Endoscopy;  Laterality: N/A;  1215pm   ESOPHAGOGASTRODUODENOSCOPY N/A 10/24/2019   Schatzki's ring s/p dilation and single medium firm, probable submucosal nodule without bleeding. Medium sized hiatal hernia, normal duodenum.     ESOPHAGOGASTRODUODENOSCOPY N/A 04/05/2020   10 mm leiomyoma and capsule study without obvious lesion or bleeding.   EUS  11/2019   Dr. Dulce Sellar: EUS completed March 2021 with likely leiomyoma, no other concerns. 10 mm by 10 mm.    GIVENS CAPSULE STUDY N/A 04/06/2020   Procedure: GIVENS CAPSULE STUDY;  Surgeon: Malissa Hippo, MD;  [facility];  Service: Endoscopy;  Laterality: N/A;   KNEE ARTHROPLASTY Left 2007   KNEE ARTHROPLASTY Right 2009   POLYPECTOMY  02/07/2018   Procedure: POLYPECTOMY;  Surgeon: West Bali, MD;  [facility];  Service: Endoscopy;;  ascending colon, descending, sigmoid   SAVORY DILATION N/A 10/25/2015   Procedure: SAVORY DILATION;  Surgeon: West Bali, MD;  [facility];  Service: Endoscopy;  Laterality: N/A;   SAVORY DILATION N/A 10/24/2019   Procedure: SAVORY DILATION;  Surgeon: West Bali, MD;  [facility];  Service: Endoscopy;  Laterality: N/A;   THYROIDECTOMY     Tumor   05/2010   Left breast under nipple   VAGINAL HYSTERECTOMY     Partial   VESICOVAGINAL FISTULA CLOSURE W/ TAH     fibroid tumors   Social History   Social History Narrative   01/26/23: Patient is currently helping to care for her brother who has cancer.    Immunization History  Administered Date(s) Administered   Fluad Quad(high Dose 65+) 06/29/2021, 05/26/2022   Fluad Trivalent(High Dose 65+) 06/11/2023   Influenza Split 07/08/2013   Influenza, High Dose Seasonal PF 06/16/2019   Influenza,inj,Quad PF,6+ Mos 05/25/2014, 06/15/2015, 06/22/2016, 06/19/2017, 06/21/2018   Influenza,inj,quad, With Preservative 06/11/2017   Influenza-Unspecified 05/13/2011, 06/25/2020   PFIZER(Purple Top)SARS-COV-2 Vaccination 10/01/2019, 10/22/2019, 06/10/2020   Pneumococcal Conjugate-13 10/12/2014   Pneumococcal Polysaccharide-23 07/01/2008   Zoster, Live 07/01/2008     Objective: Vital Signs: BP 113/70 (BP Location: Left Arm, Patient  Position: Sitting, Cuff Size: Large)   Pulse 76   Resp 14   Ht 5\' 6"  (1.676 m)   Wt 184 lb (83.5 kg)   BMI 29.70 kg/m    Physical Exam Constitutional:      General: She is not in acute distress.    Appearance: She is not ill-appearing.  HENT:     Nose: No congestion.     Mouth/Throat:     Mouth: Mucous membranes are moist.  Eyes:     General: No scleral icterus.    Conjunctiva/sclera: Conjunctivae  normal.  Cardiovascular:     Rate and Rhythm: Normal rate and regular rhythm.     Heart sounds: No murmur heard.    No friction rub. No gallop.  Pulmonary:     Effort: Pulmonary effort is normal. No respiratory distress.     Breath sounds: No wheezing, rhonchi or rales.  Musculoskeletal:        General: No swelling, tenderness or deformity.     Cervical back: Neck supple. No rigidity or tenderness.     Right lower leg: No edema.     Left lower leg: No edema.  Lymphadenopathy:     Cervical: No cervical adenopathy.  Skin:    General: Skin is warm and dry.     Capillary Refill: Capillary refill takes less than 2 seconds.     Findings: No rash.  Neurological:     General: No focal deficit present.     Mental Status: She is oriented to person, place, and time.    Musculoskeletal Exam:   Tenderness to palpation over right temporal region overlying area of where her prior biopsy was Neck full ROM no tenderness Shoulders full ROM no tenderness or swelling Elbows full ROM no tenderness or swelling Wrists full ROM no tenderness or swelling Fingers full ROM no tenderness or swelling No paraspinal tenderness to palpation over upper and lower back Hip normal internal and external rotation without pain, no tenderness to lateral hip palpation Knees full ROM no tenderness or swelling Ankles full ROM no tenderness or swelling MTPs full ROM no tenderness or swelling  Investigation: No additional findings.  Imaging: No results found.  Recent Labs: Lab Results  Component Value  Date   WBC 7.8 09/28/2023   HGB 14.5 09/28/2023   PLT 253 09/28/2023   NA 140 09/28/2023   K 4.2 09/28/2023   CL 103 09/28/2023   CO2 29 09/28/2023   GLUCOSE 107 (H) 09/28/2023   BUN 9 09/28/2023   CREATININE 0.75 09/28/2023   BILITOT 0.6 11/13/2022   ALKPHOS 100 11/13/2022   AST 22 11/13/2022   ALT 15 11/13/2022   PROT 7.2 11/13/2022   ALBUMIN 4.5 11/13/2022   CALCIUM 10.0 09/28/2023   GFRAA 81 08/24/2020    Speciality Comments: No specialty comments available.  Procedures:  No procedures performed Allergies: Aricept [donepezil hcl], Hydrocodone, Lisinopril, Naprosyn [naproxen], Oxycodone, and Rofecoxib   Assessment / Plan:     Visit Diagnoses:   Temporal arteritis syndrome (HCC) Ms. Stofer has been having right temporal tenderness that improved with high-dose steroids and have been coming back while tapering down her steroids.  She has a history of elevated ESR levels that have been improving as well.  Temporal biopsy was negative for GCA, however her symptoms may be indicative of GCA.  Given her headaches improving with steroids, and the risk of blindness with GCA I be hesitant of removing her on steroids at this time.  However there may be other causes to her temporal headache.  She does see neurology already but has not been evaluated for these type of headaches.  She describes sharp pain that comes in clusters over her temporal region which could be indicative of cluster headaches.  She also describes pain that is worse in the morning and improves as the day goes by, STOP-BANG >3 and may benefit from having a sleep study.  She was also consistently using Tylenol which she has now been cutting back on.  She does have some muscle weakness but this may  be more related to underuse versus proximal weakness seen in PMR.  We will check ESR levels today.  We will increase the dose of her steroids if her ESR levels are elevated.   -ESR, CRP, BMP  -cw Prednisone taper, consider increasing  dose if inflammatory markers are increased.  -Consider neurological evaluation and sleep study for headaches if serology normal  Long term current use of systemic steroids  Orders: Orders Placed This Encounter  Procedures   Sedimentation rate   C-reactive protein   BASIC METABOLIC PANEL WITH GFR   Meds ordered this encounter  Medications   predniSONE (DELTASONE) 5 MG tablet    Sig: Take 3 tablets (15 mg total) by mouth daily with breakfast for 14 days, THEN 2.5 tablets (12.5 mg total) daily with breakfast for 14 days, THEN 2 tablets (10 mg total) daily with breakfast for 14 days, THEN 1.5 tablets (7.5 mg total) daily with breakfast for 28 days.    Dispense:  147 tablet    Refill:  0    Follow-Up Instructions: Return in about 2 months (around 01/30/2024) for ?GCA GC taper f/u 2mos.   Manuela Neptune, MD  Pt seen and evaluated with Dr. Dimple Casey   Note - This record has been created using [employer] software.  Chart creation errors have been sought, but may not always  have been located. Such creation errors do not reflect on  the standard of medical care.  I have examined the patient along with Dr. Hassan Rowan and personally reviewed the above documentation.  Fuller Plan MD

## 2023-12-01 LAB — BASIC METABOLIC PANEL WITH GFR
BUN/Creatinine Ratio: 16 (calc) (ref 6–22)
BUN: 16 mg/dL (ref 7–25)
CO2: 27 mmol/L (ref 20–32)
Calcium: 10.1 mg/dL (ref 8.6–10.4)
Chloride: 103 mmol/L (ref 98–110)
Creat: 0.99 mg/dL — ABNORMAL HIGH (ref 0.60–0.95)
Glucose, Bld: 123 mg/dL — ABNORMAL HIGH (ref 65–99)
Potassium: 3.9 mmol/L (ref 3.5–5.3)
Sodium: 138 mmol/L (ref 135–146)

## 2023-12-01 LAB — C-REACTIVE PROTEIN: CRP: 4.5 mg/L (ref <8.0)

## 2023-12-01 LAB — SEDIMENTATION RATE: Sed Rate: 17 mm/h (ref 0–30)

## 2023-12-03 ENCOUNTER — Encounter: Payer: Self-pay | Admitting: Family Medicine

## 2023-12-03 ENCOUNTER — Ambulatory Visit: Admitting: Family Medicine

## 2023-12-03 ENCOUNTER — Encounter: Payer: Self-pay | Admitting: Internal Medicine

## 2023-12-03 VITALS — BP 142/70 | HR 56 | Temp 97.5°F | Ht 66.0 in | Wt 185.0 lb

## 2023-12-03 DIAGNOSIS — R32 Unspecified urinary incontinence: Secondary | ICD-10-CM

## 2023-12-03 DIAGNOSIS — R739 Hyperglycemia, unspecified: Secondary | ICD-10-CM | POA: Diagnosis not present

## 2023-12-03 DIAGNOSIS — R519 Headache, unspecified: Secondary | ICD-10-CM | POA: Diagnosis not present

## 2023-12-03 LAB — POCT URINALYSIS DIP (CLINITEK)
Bilirubin, UA: NEGATIVE
Blood, UA: NEGATIVE
Glucose, UA: NEGATIVE mg/dL
Ketones, POC UA: NEGATIVE mg/dL
Leukocytes, UA: NEGATIVE
Nitrite, UA: NEGATIVE
POC PROTEIN,UA: NEGATIVE
Spec Grav, UA: <=1.005 — AB (ref 1.010–1.025)
Urobilinogen, UA: 0.2 U/dL
pH, UA: 8 (ref 5.0–8.0)

## 2023-12-03 NOTE — Progress Notes (Unsigned)
   Subjective:    Patient ID: Melissa Barajas, female    DOB: 11-Aug-1941, 83 y.o.   MRN: [id number]  HPI Headache  Prednisone questions patient was concerned about long-term effects She understands the reason she is on it Recently saw Dr. Dimple Casey This note was reviewed with her Patient was told that it appears that Dr. Dimple Casey is trying to taper off of her prednisone which seems reasonable  Bladder control unable to make to bathroom in morning. Happened 2 times.  Patient states this happened over the past 3 days.  Relates increased urination since being on prednisone States gaining weight.  She is concerned about sarcopenia She would like to get off of prednisone  She still has intermittent headaches more in the temporal region but not severe no nausea vomiting or blurred vision with them Patient with some fatigue tiredness could be related into sleep apnea since she does have daytime sleepiness.   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  Rheumatology does not feel that this is ongoing temporal arteritis causing her headaches. Her sed rate and CRP have looked good Because of all this Dr. Dimple Casey states that he does not feel that prolonged use of prednisone is warranted He has encouraged patient to gradually taper I agree with this approach I reinforced with patient the need to gradually taper the prednisone Then over time as long as not having any setbacks should be able to get off of the prednisone  Hyperglycemia probably related to some degree to the prednisone check A1c  Healthy diet recommended  Neurology consult was recommended to the patient because of her ongoing frequent headaches Dr. Dimple Casey  does not feel that these ongoing headaches or temporal arteritis Patient does have some self admitting snoring but she does not think she has sleep apnea but once again she does not listen to her scalp when she sleeps she is very sleepy during the day and tired during the day I have encouraged her to see neurology she agrees to this We will go ahead with referral She has a follow-up with [place] in June follow-up sooner if any problems  As for the urinary issue.  I cannot help but think she is producing more urine at night because of her increased glucose.  I have encouraged her to minimize fluids close to bedtime she has no trouble controlling your urination during the day I would recommend no medications at this point certainly if it gets worse urogynecology would be reasonable

## 2023-12-05 ENCOUNTER — Other Ambulatory Visit: Payer: Self-pay

## 2023-12-05 DIAGNOSIS — R519 Headache, unspecified: Secondary | ICD-10-CM

## 2023-12-05 DIAGNOSIS — R0683 Snoring: Secondary | ICD-10-CM

## 2024-01-03 ENCOUNTER — Encounter: Payer: Self-pay | Admitting: Neurology

## 2024-01-03 ENCOUNTER — Ambulatory Visit: Admitting: Neurology

## 2024-01-03 VITALS — BP 148/77 | HR 65 | Ht 66.0 in | Wt 192.0 lb

## 2024-01-03 DIAGNOSIS — G43109 Migraine with aura, not intractable, without status migrainosus: Secondary | ICD-10-CM | POA: Diagnosis not present

## 2024-01-03 DIAGNOSIS — R519 Headache, unspecified: Secondary | ICD-10-CM

## 2024-01-03 DIAGNOSIS — G4719 Other hypersomnia: Secondary | ICD-10-CM | POA: Diagnosis not present

## 2024-01-03 DIAGNOSIS — R0683 Snoring: Secondary | ICD-10-CM

## 2024-01-03 DIAGNOSIS — G4720 Circadian rhythm sleep disorder, unspecified type: Secondary | ICD-10-CM | POA: Diagnosis not present

## 2024-01-03 MED ORDER — TRAZODONE HCL 50 MG PO TABS: 50.0000 mg | ORAL_TABLET | Freq: Every day | ORAL | 5 refills | Status: DC

## 2024-01-03 NOTE — Progress Notes (Signed)
 [facility]    Provider:  Neomia Banner, MD  Primary Care Physician:  Bennet Brasil, MD [address] [place] [address]     Referring Provider: Bennet Brasil, Md [address] [place],  [place] [address]          Chief Complaint according to patient   Patient presents with:     New Patient (Initial Visit)           HISTORY OF PRESENT ILLNESS:  Melissa Barajas is a 83 y.o. female patient who is seen upon referral on 01/03/2024 from PCP to see us  for a sleep consult , based on snoring, choking in sleep. She also has a heache condition for which she has seen ENT : History of Present Illness   Melissa Barajas is an 83 year old female who presents for post-op f/u, 2 weeks after R temporal artery biopsy 2/2 hx of headaches. Pathology was negative for temporal arteritis.    She experiences intermittent headaches that have improved since her surgery. The pain is described as 'comes and goes.' A neurosurgeon ruled out an aneurysm as a cause of her headaches, and she has been referred to neurology for further evaluation of her chronic headaches.  Dr Larkin Plumb had seen her in February 2025 and  reported good healing of the biopsy site.     She has considered that wearing a wig might contribute to her headaches, noting that she only wears it when going out and not at home unless expecting visitors.   Chief concern according to patient : I have the pleasure of seeing Melissa Barajas on 01/03/24 a right -handed female with a possible sleep related headaches, waking up  with these headches. 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 She has not slept well on steroids but HA were better.    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 prediabetic.   Family medical /sleep history: no  other family member on CPAP with OSA, insomnia, sleep walkers.    Social history: grew up one of 6 siblings here  with her sister.  Sister has OSA. 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  and lives in a household with son and daughter in law, one cat . The patient  used to work in shifts( night/ rotating,) Tobacco use; remote ,quit 30 year ago , used to smoke 10 cigarettes a day between 40 and 30  years of age .  ETOH use ; none,  Caffeine intake in form of Coffee( decaff) Soda( pepsi, 2 a day ) Tea ( /)  no Energy drinks Exercise in form of walking , but no routine. Headaches became worse when exercising..      Sleep habits are as follows:  The patient's dinner time is between 4-5  PM. The patient goes to bed at 2 AM , having slept already in a recliner watching TV and continues to nap there on and off.  Bedroom is cool, mostly quiet and dark.   3 times nocturia. Takes a sleep aid. And she sleeps only 4-5 hours.  She rises at 7.30 AM to 8 AM.  The preferred sleep position  is laterally, with the support of 2 pillows.  flat bed. Dreams are reportedly infrequent.  The patient wakes up spontaneously/ 6.30,  but 8  AM is the usual rise time. She reports  feeling refreshed or restored in AM, with some symptoms such as dry mouth, and recently more morning headaches, and residual fatigue.  Naps are taken frequently, lasting from 5-15  minutes and are refreshing than nocturnal sleep.    Review of Systems: Out of a complete 14 system review, the patient complains of only the following symptoms, and all other reviewed systems are negative.:  Fatigue, sleepiness , snoring, fragmented sleep, Insomnia, shift work circadian rhythm.  Nocturia 2-4    How likely are you to doze in the following situations: 0 = not likely, 1 = slight chance, 2 = moderate chance, 3 = high  chance   Sitting and Reading? Watching Television? Sitting inactive in a public place (theater or meeting)? As a passenger in a car for an hour without a break? Lying down in the afternoon when circumstances permit? Sitting and talking to someone? Sitting quietly after lunch without alcohol? In a car, while stopped for a few minutes in traffic?   Total = 12/ 24 points   FSS endorsed at 24/ 63 points.   GDS 0/ 15   Social History   Socioeconomic History   Marital status: Widowed    Spouse name: Not on file   Number of children: Not on file   Years of education: Not on file   Highest education level: Not on file  Occupational History   Not on file  Tobacco Use   Smoking status: Former    Current packs/day: 0.00    Average packs/day: 1 pack/day for 15.0 years (15.0 ttl pk-yrs)    Types: Cigarettes    Start date: 09/12/1983    Quit date: 09/11/1993    Years since quitting: 30.3    Passive exposure: Never   Smokeless tobacco: Never  Vaping Use   Vaping status: Never Used  Substance and Sexual Activity   Alcohol use: No    Alcohol/week: 0.0 standard drinks of alcohol   Drug use: No   Sexual activity: Not Currently    Birth control/protection: Post-menopausal, Surgical  Other Topics Concern   Not on file  Social History Narrative   01/26/23: Patient is currently helping to care for her brother who has cancer.    Social Drivers of Corporate investment banker Strain: Low Risk  (01/26/2023)   Overall Financial Resource Strain (CARDIA)    Difficulty of Paying Living Expenses: Not hard at all  Food Insecurity: No Food Insecurity (02/14/2023)   Hunger Vital Sign    Worried About Running Out of Food in the Last Year: Never true    Ran Out of Food in the Last Year: Never true  Transportation Needs: No Transportation Needs (02/14/2023)   PRAPARE - Administrator, Civil Service (Medical): No    Lack of Transportation (Non-Medical): No  Physical Activity: Inactive  (01/26/2023)   Exercise Vital Sign    Days of Exercise per Week: 0 days    Minutes of Exercise per Session: 0 min  Stress: No Stress Concern Present (01/26/2023)   [employer] of Occupational Health - Occupational Stress Questionnaire    Feeling of Stress : Not at all  Social Connections: Socially Integrated (01/26/2023)   Social Connection and Isolation Panel [NHANES]    Frequency of Communication with Friends and Family: More than  three times a week    Frequency of Social Gatherings with Friends and Family: More than three times a week    Attends Religious Services: More than 4 times per year    Active Member of Clubs or Organizations: Yes    Attends Engineer, structural: More than 4 times per year    Marital Status: Married    Family History  Problem Relation Age of Onset   Liver disease Father    Arthritis Sister    Pulmonary disease Sister    Sleep apnea Sister    Arthritis Brother    Liver cancer Brother    Cancer Brother        mouth cancer   Cancer Brother    Cancer Brother    Healthy Daughter    Healthy Daughter    Arthritis Son    Hypertension Son    Diabetes Son        pre-diabetes   High Cholesterol Son    Leukemia Son     Past Medical History:  Diagnosis Date   Arthritis    Atrial fibrillation (HCC)    Remote prior history   Collagen vascular disease (HCC)    DIVERTICULOSIS OF COLON 08/25/2010   Qualifier: Diagnosis of  By: Buzz Cass     GERD (gastroesophageal reflux disease)    HEMORRHOIDS, INTERNAL 08/25/2010   Qualifier: Diagnosis of  By: Buzz Cass     Hypercholesteremia    Hypertension    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 screening colonoscopy in 10 years 2. She should follow a high fiber diet She has been given a hand out on high fiber diverticulosois and hemorrhoids   COLONOSCOPY N/A 02/07/2018   Procedure: COLONOSCOPY;  Surgeon: Alyce Jubilee, MD;  Location: [facility];  Service: Endoscopy;  Laterality: N/A;  1:00pm   CYSTECTOMY     Left breast   ESOPHAGOGASTRODUODENOSCOPY     JXB:[id number] appearing Schatzki's ring, possible cervical esophageal web, both dilated with passage of a Maloney dilator/small hiatal hernia   ESOPHAGOGASTRODUODENOSCOPY N/A 10/25/2015   Procedure: ESOPHAGOGASTRODUODENOSCOPY (EGD);  Surgeon: Alyce Jubilee, MD;  [facility];  Service: Endoscopy;  Laterality: N/A;  1215pm   ESOPHAGOGASTRODUODENOSCOPY N/A 10/24/2019   Schatzki's ring s/p dilation and single medium firm, probable submucosal nodule without bleeding. Medium sized hiatal hernia, normal duodenum.    ESOPHAGOGASTRODUODENOSCOPY N/A 04/05/2020   10 mm leiomyoma and capsule study without obvious lesion or bleeding.   EUS  11/2019   Dr. Kimble Pennant: EUS completed March 2021 with likely leiomyoma, no other concerns. 10 mm by 10 mm.    GIVENS CAPSULE STUDY N/A 04/06/2020   Procedure: GIVENS CAPSULE STUDY;  Surgeon: Ruby Corporal, MD;  [facility];  Service: Endoscopy;  Laterality: N/A;   KNEE ARTHROPLASTY Left 2007   KNEE ARTHROPLASTY Right 2009   POLYPECTOMY  02/07/2018   Procedure: POLYPECTOMY;  Surgeon: Alyce Jubilee, MD;  [facility];  Service: Endoscopy;;  ascending colon, descending, sigmoid   SAVORY DILATION N/A 10/25/2015   Procedure: SAVORY DILATION;  Surgeon: Alyce Jubilee, MD;  [facility];  Service: Endoscopy;  Laterality: N/A;   SAVORY DILATION N/A 10/24/2019   Procedure: SAVORY DILATION;  Surgeon:  Alyce Jubilee, MD;  [facility];  Service: Endoscopy;  Laterality: N/A;   THYROIDECTOMY     Tumor   05/2010   Left breast under nipple   VAGINAL HYSTERECTOMY     Partial   VESICOVAGINAL FISTULA CLOSURE W/ TAH     fibroid tumors     Current Outpatient Medications on File Prior to Visit  Medication Sig Dispense Refill   acetaminophen  (TYLENOL ) 500 MG tablet Take 1,000 mg by mouth 2 (two) times daily as needed for headache or moderate pain (pain score 4-6).     amLODipine  (NORVASC ) 10 MG tablet Take 1 tablet (10 mg total) by mouth daily. 6 tablet 0   BREO ELLIPTA  200-25 MCG/ACT AEPB USE 1 INHALATION BY MOUTH DAILY 180 each 0   cloNIDine  (CATAPRES ) 0.2 MG tablet Take 1 tablet (0.2 mg total) by mouth 2 (two) times daily. 180 tablet 2   diphenhydrAMINE HCl, Sleep, (SLEEP AID LIQUID GELS MAX ST PO) Take by mouth at bedtime.     famotidine  (PEPCID ) 40 MG tablet Take 40 mg by mouth daily.     pantoprazole  (PROTONIX ) 40 MG tablet Take 1 tablet (40 mg total) by mouth daily. 90 tablet 0   potassium chloride  (KLOR-CON  M) 10 MEQ tablet TAKE 1 TABLET BY MOUTH DAILY 90 tablet 3   predniSONE  (DELTASONE ) 5 MG tablet Take 3 tablets (15 mg total) by mouth daily with breakfast for 14 days, THEN 2.5 tablets (12.5 mg total) daily with breakfast for 14 days, THEN 2 tablets (10 mg total) daily with breakfast for 14 days, THEN 1.5 tablets (7.5 mg total) daily with breakfast for 28 days. 147 tablet 0   rosuvastatin  (CRESTOR ) 20 MG tablet TAKE 1 TABLET BY MOUTH ON MONDAYS AND FRIDAYS 26 tablet 4   No current facility-administered medications on file prior to visit.    Allergies  Allergen Reactions   Aricept  [Donepezil  Hcl]     Sleep deprived   Hydrocodone Nausea Only   Lisinopril Other (See Comments)    Angio edema   Naprosyn [Naproxen] Nausea Only   Oxycodone      GI Intolerance   Rofecoxib     Unknown reaction     DIAGNOSTIC DATA (LABS, IMAGING, TESTING) - I reviewed patient records,  labs, notes, testing and imaging myself where available.  Lab Results  Component Value Date   WBC 7.8 09/28/2023   HGB 14.5 09/28/2023   HCT 44.6 09/28/2023   MCV 97.6 09/28/2023   PLT 253 09/28/2023      Component Value Date/Time   NA 138 11/30/2023 1224   NA 140 11/13/2022 1106   K 3.9 11/30/2023 1224   CL 103 11/30/2023 1224   CO2 27 11/30/2023 1224   GLUCOSE 123 (H) 11/30/2023 1224   BUN 16 11/30/2023 1224   BUN 10 11/13/2022 1106   CREATININE 0.99 (H) 11/30/2023 1224   CALCIUM  10.1 11/30/2023 1224   PROT 7.2 11/13/2022 1106   ALBUMIN 4.5 11/13/2022 1106   AST 22 11/13/2022 1106   ALT 15 11/13/2022 1106   ALKPHOS 100 11/13/2022 1106   BILITOT 0.6 11/13/2022 1106   GFRNONAA >60 09/28/2023 1056   GFRAA 81  08/24/2020 1101   Lab Results  Component Value Date   CHOL 161 11/13/2022   HDL 44 11/13/2022   LDLCALC 103 (H) 11/13/2022   TRIG 71 11/13/2022   CHOLHDL 3.7 11/13/2022   Lab Results  Component Value Date   HGBA1C 5.9 (H) 04/05/2020   No results found for: "VITAMINB12" Lab Results  Component Value Date   TSH 0.479 02/07/2023    PHYSICAL EXAM:  Today's Vitals   01/03/24 1526  BP: (!) 148/77  Pulse: 65  Weight: 192 lb (87.1 kg)  Height: 5\' 6"  (1.676 m)   Body mass index is 30.99 kg/m.   Wt Readings from Last 3 Encounters:  01/03/24 192 lb (87.1 kg)  12/03/23 185 lb (83.9 kg)  11/30/23 184 lb (83.5 kg)     Ht Readings from Last 3 Encounters:  01/03/24 5\' 6"  (1.676 m)  12/03/23 5\' 6"  (1.676 m)  11/30/23 5\' 6"  (1.676 m)      General: The patient is awake, alert and appears not in acute distress. The patient is well groomed. Head: Normocephalic, atraumatic. Neck is supple.  Mallampati 2-3 ,  neck circumference:16. 75 inches . Nasal airflow fully patent.  Retrognathia is not seen.  Dental status:  biological Cardiovascular:  Regular rate and cardiac rhythm by pulse,  without distended neck veins. Respiratory: Lungs are clear to auscultation.   Skin:  Without evidence of ankle edema, or rash. Trunk: The patient's posture is erect.   NEUROLOGIC EXAM: The patient is awake and alert, oriented to place and time.   Memory subjective described as intact.  Attention span & concentration ability appears normal.  Speech is fluent,  without  dysarthria, dysphonia or aphasia.  Mood and affect are appropriate.   Cranial nerves: no loss of smell or taste reported  Pupils are equal and briskly reactive to light. Funduscopic exam deferred. .  Extraocular movements in vertical and horizontal planes were intact and without nystagmus. No Diplopia. Visual fields by finger perimetry are intact. Hearing was intact to soft voice and finger rubbing.    Facial sensation intact to fine touch.  Facial motor strength is symmetric and tongue and uvula move midline.  Neck ROM : rotation, tilt and flexion extension were normal for age and shoulder shrug was symmetrical.    Motor exam:  Symmetric bulk, tone and ROM.   Normal tone without cog-  wheeling, symmetric grip strength .   Sensory:  Fine touch and vibration intact   Proprioception tested in the upper extremities was normal.   Coordination: Rapid alternating movements in the fingers/hands were of normal speed.    Gait and station: Patient could rise unassisted from a seated position, walked without assistive device.  Stance is of normal width/ base.  Deep tendon reflexes: in the  upper and lower extremities are symmetric and intact.  Babinski response was deferred.     ASSESSMENT AND PLAN 83 y.o. year old female  here with: The patient is an 14 yoF, with a history of thyroid  surgery (she is unsure if it was partial thyroidectomy vs total) and a brain aneurysm, presents with a chief complaint of persistent pain around the right temple area, radiating down the side of the eye and towards the ear. The pain, described as an ache, is intermittent and has been waking the patient up at night. The  patient also reports episodes of blurry vision and dizziness, with one significant episode lasting approximately four hours, during which she experienced difficulty walking in a  straight line and had a sensation of the room moving.  The patient also reported feeling  sick after this episode.   right sided headache after an initial concern for temporal arteritis . Pathology was negative for temporal arteritis.  on steroid tape now,   steroids were worsening  insomnia, she was HA free for several months and ow HA have returned. A neurosurgeon ruled out an aneurysm as a cause of her headaches, and she has been referred to neurology for further evaluation of her chronic headaches.       1) Morning headaches in AM not currently woken by headaches.   She has reported right temple and retro-orbital on the right side only, may radiate all the way to the nape of the neck and back to top crown.  Lines blurring, nausea, photophobia , transient diplopia , feeling off balance : this could be ophthalmic migraine.   2) She has now again struggled with HTN , she has HTN retinopathy.  3) Nocturia, pre diabetic, can be related to steroid treatment - Sedimentation rate and CRP are both normal. So inflammation appears to be under control. She can proceed with the gradual prednisone  tapering   4)  Snoring, poor sleep routines, circadian rhythm disorder since shift work history.  Excessive daytime sleepiness- needs better routines,  restriction of nap time, set rise time and set bedtime.  Will order a HST rather than a polysomnography.    I also added a MRI brain, she had her last Ct angio in January 2024, her MRI was in July 2024.    Insomnia and migraine can respond to trazodone . Ordered 50 mg at 11 Pm. Po.   I plan to follow up either through our NP in -6  months.   I would like to thank Bennet Brasil, MD and Bennet Brasil, Md [address] [place],  [place] [address] for allowing me to meet with  and to take care of this pleasant patient.   CC: I will share my notes with Dr Larkin Plumb.  After spending a total time of  45  minutes face to face and additional time for physical and neurologic examination, review of laboratory studies,  personal review of imaging studies, reports and results of other testing and review of referral information / records as far as provided in visit,   Electronically signed by: Neomia Banner, MD 01/03/2024 3:47 PM  [facility] Sleep Board certified by The [employer] of Sleep Medicine and Diplomate of the [employer] of Sleep Medicine. Board certified In Neurology through the ABPN, Fellow of the [employer] of Neurology.

## 2024-01-07 DIAGNOSIS — R739 Hyperglycemia, unspecified: Secondary | ICD-10-CM | POA: Diagnosis not present

## 2024-01-07 DIAGNOSIS — R519 Headache, unspecified: Secondary | ICD-10-CM | POA: Diagnosis not present

## 2024-01-08 LAB — HEMOGLOBIN A1C
Est. average glucose Bld gHb Est-mCnc: 151 mg/dL
Hgb A1c MFr Bld: 6.9 % — ABNORMAL HIGH (ref 4.8–5.6)

## 2024-01-08 LAB — SEDIMENTATION RATE: Sed Rate: 15 mm/h (ref 0–40)

## 2024-01-09 ENCOUNTER — Telehealth: Payer: Self-pay | Admitting: Neurology

## 2024-01-09 NOTE — Telephone Encounter (Signed)
 MRI brain BCBS medicare auth: [id number] exp. 01/09/24-02/07/24  MRA head BCBS medicare auth: [id number] exp. 01/09/24-02/07/24 sent to GI [phone]

## 2024-01-15 ENCOUNTER — Ambulatory Visit: Admitting: Neurology

## 2024-01-15 DIAGNOSIS — G4719 Other hypersomnia: Secondary | ICD-10-CM

## 2024-01-15 DIAGNOSIS — R0683 Snoring: Secondary | ICD-10-CM

## 2024-01-15 DIAGNOSIS — G4720 Circadian rhythm sleep disorder, unspecified type: Secondary | ICD-10-CM

## 2024-01-15 DIAGNOSIS — R519 Headache, unspecified: Secondary | ICD-10-CM

## 2024-01-15 DIAGNOSIS — G43109 Migraine with aura, not intractable, without status migrainosus: Secondary | ICD-10-CM

## 2024-01-22 ENCOUNTER — Telehealth: Payer: Self-pay

## 2024-01-22 ENCOUNTER — Other Ambulatory Visit: Payer: Self-pay | Admitting: Nurse Practitioner

## 2024-01-22 DIAGNOSIS — R1013 Epigastric pain: Secondary | ICD-10-CM

## 2024-01-22 MED ORDER — PANTOPRAZOLE SODIUM 40 MG PO TBEC: 40.0000 mg | DELAYED_RELEASE_TABLET | Freq: Every day | ORAL | 0 refills | Status: DC

## 2024-01-22 NOTE — Telephone Encounter (Signed)
 Communication  Reason for CRM: Patient is calling in wanting to know if her provider could prescribe her pantoprazole  (PROTONIX ) 40 MG tablet [[id number]] in a 3 month supply. Patient says another doctor prescribed it for her, but she would like for her pcp to take it over so she can continue to get it.

## 2024-01-28 NOTE — Progress Notes (Deleted)
 Office Visit Note  Patient: Melissa Barajas             Date of Birth: May 09, 1941           MRN: [id number]             PCP: Bennet Brasil, MD Referring: Bennet Brasil, MD Visit Date: 02/11/2024   Subjective:  No chief complaint on file.   History of Present Illness: Melissa Barajas is a 83 y.o. female here for follow up on her temporal HA.    Previous HPI 11/30/2023 Melissa Barajas is a 83 y.o. female here for follow up on her temporal HA. She was last seen in February and was having right temporal headaches that were sharp and improved after starting steroids. She started tapering her steroids and when she reached 3.5 tablets she started getting her headaches back. These are intermittent, worse in the morning or wake her up at night. They will get better during the rest of the day. They are dull. She denies them improving when she gets fresh air. She does state that her husband used to tell her that she will snore loudly, has woken up choking, is obese and has a history of hypertension. She denies having any rhinorrhea, lacrimation, or facial droop. Denies having any muscle weakness but endorses fatigue going up the stairs after starting her steroids. She used her breo ellipta  not daily but now has been requiring daily. She has not had difficulty with repetitive movements, has not had difficulty getting up a chair, no difficulty getting up the stairs. She was using tylenol  daily extra strength more than 1000mg  a day consistently before her headaches started. She has cut back on this and is now taking it intermittently.    Previous HPI 10/29/23 Melissa Barajas is an 83 year old female here for evaluation and management of temporal arteritis who presents with recurrent headaches with associated tests elevated sedimentation rate and abnormal TA biopsy. She is accompanied by her sister.   She has been experiencing daily headaches since last summer, beginning around August or September. The pain  originates from her temple and radiates to her eye. Initially, the headaches occurred twice daily, approximately every six to seven hours, and were managed with extra strength Tylenol , providing relief for about six to seven hours. Over time, the frequency decreased to once a day. A biopsy on her head temporarily alleviated the headaches, but they have recently started to return, albeit less severe and not daily.   She is currently taking prednisone , 40 mg daily in the morning after eating, to manage her condition. Started since last week with Dr. Sherri Doffing office. She experiences side effects such as difficulty sleeping. She has a history of taking prednisone  during a COVID-19 infection, which caused her to feel 'strung out'. Her sister is concerned about the high dose of prednisone  and its potential side effects, including increased appetite, fluid retention, and elevated blood pressure. She acknowledges a tendency to crave sweets, which she is trying to manage.   She reportedly has a history of rheumatoid arthritis diagnosed at age 15, with symptoms including crooked fingers and occasional knee pain. She has two artificial knees and experiences swelling and pain in her feet, though these symptoms have lessened since she stopped working. Her sister also reports having rheumatoid arthritis.   She reports waking up with chest pain in the past few days. She does have history of acid reflux, for which she takes  pantoprazole  40 mg although this is not in her medicine list. She also mentions an irregular heartbeat and is supposed to be on medication for it, though she is unsure of the specifics. EKG checked in January of this year was nonspecific.   Biopsy Result 10/03/23 A. RIGHT TEMPORAL ARTERY, BIOPSY:  Segment of artery with very focal loss of the elastic layer suggestive of healed arteritis  Negative for fibrosis, inflammation and giant cells      Labs reviewed   10/29/2023 ESR 19   05/2023 ESR  18   01/2023 ESR 60   11/2022 RF neg ESR 34   No Rheumatology ROS completed.   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   Urge incontinence 03/25/2013   Hyperlipidemia 08/25/2010   Essential hypertension 08/25/2010   GERD 08/25/2010    Past Medical History:  Diagnosis Date   Arthritis    Atrial fibrillation (HCC)    Remote prior history   Collagen vascular disease (HCC)    DIVERTICULOSIS OF COLON 08/25/2010   Qualifier: Diagnosis of  By: Buzz Cass     GERD (gastroesophageal reflux disease)    HEMORRHOIDS, INTERNAL 08/25/2010   Qualifier: Diagnosis of  By: Buzz Cass     Hypercholesteremia    Hypertension    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/03/2020   EGD and EGD US  [facility] 01/2020   Lipoma of arm    Left    Family History  Problem Relation Age of Onset   Liver disease Father    Arthritis Sister    Pulmonary disease Sister    Sleep apnea Sister    Arthritis Brother    Liver cancer Brother    Cancer Brother        mouth cancer   Cancer Brother    Cancer Brother    Healthy Daughter    Healthy Daughter    Arthritis Son    Hypertension Son    Diabetes Son        pre-diabetes   High Cholesterol Son    Leukemia Son    Past Surgical History:  Procedure Laterality Date   ARTERY BIOPSY Right 10/03/2023   Procedure: BIOPSY TEMPORAL ARTERY;  Surgeon: Artice Last, MD;  Location: [facility];  Service: ENT;  Laterality: Right;   BIOPSY  10/24/2019   Procedure: BIOPSY;  Surgeon: Alyce Jubilee, MD;  Location: [facility];  Service: Endoscopy;;   BREAST  BIOPSY Left    COLONOSCOPY  2009   SLF: 1. screening colonoscopy in 10 years 2. She should follow a high fiber diet She has been given a hand out on high fiber diverticulosois and hemorrhoids   COLONOSCOPY N/A 02/07/2018   Procedure: COLONOSCOPY;  Surgeon: Alyce Jubilee, MD;  Location: [facility];  Service: Endoscopy;  Laterality: N/A;  1:00pm   CYSTECTOMY     Left breast   ESOPHAGOGASTRODUODENOSCOPY     [id number] appearing Schatzki's ring, possible cervical esophageal web, both dilated with passage of a Maloney dilator/small hiatal hernia   ESOPHAGOGASTRODUODENOSCOPY N/A 10/25/2015   Procedure: ESOPHAGOGASTRODUODENOSCOPY (EGD);  Surgeon: Alyce Jubilee, MD;  Location:  [facility];  Service: Endoscopy;  Laterality: N/A;  1215pm   ESOPHAGOGASTRODUODENOSCOPY N/A 10/24/2019   Schatzki's ring s/p dilation and single medium firm, probable submucosal nodule without bleeding. Medium sized hiatal hernia, normal duodenum.    ESOPHAGOGASTRODUODENOSCOPY N/A 04/05/2020   10 mm leiomyoma and capsule study without obvious lesion or bleeding.   EUS  11/2019   Dr. Kimble Pennant: EUS completed March 2021 with likely leiomyoma, no other concerns. 10 mm by 10 mm.    GIVENS CAPSULE STUDY N/A 04/06/2020   Procedure: GIVENS CAPSULE STUDY;  Surgeon: Ruby Corporal, MD;  [facility];  Service: Endoscopy;  Laterality: N/A;   KNEE ARTHROPLASTY Left 2007   KNEE ARTHROPLASTY Right 2009   POLYPECTOMY  02/07/2018   Procedure: POLYPECTOMY;  Surgeon: Alyce Jubilee, MD;  [facility];  Service: Endoscopy;;  ascending colon, descending, sigmoid   SAVORY DILATION N/A 10/25/2015   Procedure: SAVORY DILATION;  Surgeon: Alyce Jubilee, MD;  [facility];  Service: Endoscopy;  Laterality: N/A;   SAVORY DILATION N/A 10/24/2019   Procedure: SAVORY DILATION;  Surgeon: Alyce Jubilee, MD;  [facility];  Service: Endoscopy;  Laterality: N/A;   THYROIDECTOMY     Tumor    05/2010   Left breast under nipple   VAGINAL HYSTERECTOMY     Partial   VESICOVAGINAL FISTULA CLOSURE W/ TAH     fibroid tumors   Social History   Social History Narrative   01/26/23: Patient is currently helping to care for her brother who has cancer.    Immunization History  Administered Date(s) Administered   Fluad Quad(high Dose 65+) 06/29/2021, 05/26/2022   Fluad Trivalent(High Dose 65+) 06/11/2023   Influenza Split 07/08/2013   Influenza, High Dose Seasonal PF 06/16/2019   Influenza,inj,Quad PF,6+ Mos 05/25/2014, 06/15/2015, 06/22/2016, 06/19/2017, 06/21/2018   Influenza,inj,quad, With Preservative 06/11/2017   Influenza-Unspecified 05/13/2011, 06/25/2020   PFIZER(Purple Top)SARS-COV-2 Vaccination 10/01/2019, 10/22/2019, 06/10/2020   Pneumococcal Conjugate-13 10/12/2014   Pneumococcal Polysaccharide-23 07/01/2008   Zoster, Live 07/01/2008     Objective: Vital Signs: There were no vitals taken for this visit.   Physical Exam   Musculoskeletal Exam: ***  CDAI Exam: CDAI Score: -- Patient Global: --; Provider Global: -- Swollen: --; Tender: -- Joint Exam 02/11/2024   No joint exam has been documented for this visit   There is currently no information documented on the homunculus. Go to the Rheumatology activity and complete the homunculus joint exam.  Investigation: No additional findings.  Imaging: No results found.  Recent Labs: Lab Results  Component Value Date   WBC 7.8 09/28/2023   HGB 14.5 09/28/2023   PLT 253 09/28/2023   NA 138 11/30/2023   K 3.9 11/30/2023   CL 103 11/30/2023   CO2 27 11/30/2023   GLUCOSE 123 (H) 11/30/2023   BUN 16 11/30/2023   CREATININE 0.99 (H) 11/30/2023   BILITOT 0.6 11/13/2022   ALKPHOS 100 11/13/2022   AST 22 11/13/2022   ALT 15 11/13/2022   PROT 7.2 11/13/2022   ALBUMIN 4.5 11/13/2022   CALCIUM  10.1 11/30/2023   GFRAA 81 08/24/2020    Speciality Comments: No specialty comments available.  Procedures:  No  procedures performed Allergies: Aricept  [donepezil  hcl], Hydrocodone, Lisinopril, Naprosyn [naproxen], Oxycodone , and Rofecoxib   Assessment / Plan:     Visit Diagnoses: No diagnosis found.  ***  Orders: No orders of the defined types were placed in this encounter.  No orders of the  defined types were placed in this encounter.    Follow-Up Instructions: No follow-ups on file.   Glena Landau, RT  Note - This record has been created using [employer].  Chart creation errors have been sought, but may not always  have been located. Such creation errors do not reflect on  the standard of medical care.

## 2024-01-29 ENCOUNTER — Ambulatory Visit (INDEPENDENT_AMBULATORY_CARE_PROVIDER_SITE_OTHER): Admitting: Neurology

## 2024-01-29 DIAGNOSIS — R519 Headache, unspecified: Secondary | ICD-10-CM

## 2024-01-29 DIAGNOSIS — G4719 Other hypersomnia: Secondary | ICD-10-CM

## 2024-01-29 DIAGNOSIS — G43109 Migraine with aura, not intractable, without status migrainosus: Secondary | ICD-10-CM

## 2024-01-29 DIAGNOSIS — G4720 Circadian rhythm sleep disorder, unspecified type: Secondary | ICD-10-CM

## 2024-01-29 DIAGNOSIS — G4733 Obstructive sleep apnea (adult) (pediatric): Secondary | ICD-10-CM

## 2024-01-29 DIAGNOSIS — R0683 Snoring: Secondary | ICD-10-CM

## 2024-01-30 NOTE — Progress Notes (Unsigned)
 Melissa Barajas

## 2024-02-02 ENCOUNTER — Ambulatory Visit
Admission: RE | Admit: 2024-02-02 | Discharge: 2024-02-02 | Disposition: A | Discharge status: Home / Self Care | Source: Ambulatory Visit | Attending: Neurology | Admitting: Neurology

## 2024-02-02 DIAGNOSIS — R519 Headache, unspecified: Secondary | ICD-10-CM

## 2024-02-02 DIAGNOSIS — G43109 Migraine with aura, not intractable, without status migrainosus: Secondary | ICD-10-CM

## 2024-02-02 DIAGNOSIS — G4719 Other hypersomnia: Secondary | ICD-10-CM | POA: Diagnosis not present

## 2024-02-02 DIAGNOSIS — G4720 Circadian rhythm sleep disorder, unspecified type: Secondary | ICD-10-CM

## 2024-02-02 DIAGNOSIS — R0683 Snoring: Secondary | ICD-10-CM

## 2024-02-06 ENCOUNTER — Ambulatory Visit: Payer: Self-pay | Admitting: Neurology

## 2024-02-06 NOTE — Procedures (Signed)
 [facility]  Melissa Barajas 83 year old female 07/23/1941   HOME SLEEP TEST REPORT ( by Watch PAT)   STUDY DATE:  01-30-2024     ORDERING CLINICIAN: Neomia Banner, MD  REFERRING CLINICIAN: Dr Fairy Homer, MD    CLINICAL INFORMATION/HISTORY: Melissa Barajas is a 83 y.o. female patient who is seen upon referral on 01/03/2024 from PCP to see us  for a sleep consult , based on snoring, choking in sleep. She also has a headache condition for which she has seen ENT/ suspected sinus related headaches : Melissa Barajas is an 83 year old female who presents after R temporal artery biopsy 2/2 hx of headaches.Dr Larkin Plumb had seen her in February 2025 and reported good healing of the biopsy site.   Pathology was negative for temporal arteritis- the patient reports morning headaches, frequent naps. Her right temporal headaches improved under steroids but returned after weaning off.  Steroids induced or worsened insomnia.     Epworth sleepiness score:12/ 24 points   FSS endorsed at 24/ 63 points.    BMI: 31 kg/m   Neck Circumference: 16.75"   FINDINGS:   Sleep Summary:   Total Recording Time (hours, min):     7 h and 25 m    Total Sleep Time (hours, min):   6 hours and 7 minutes              Percent REM (%):  24.2%                                      Respiratory Indices:   Calculated pAHI (per CMS guideline): 6.4/h REM pAHI:       10.8 /h                                        NREM pAHI:  5/h      Snoring reached a mean Volume of 41 dB, mild.   The patient slept most of the night in supine position. There were 78 minutes of right lateral sleep recorded and the AHI was 0/h. There was no REM sleep recorded during right lateral sleep.                                                        Oxygen Saturation Statistics:   Oxygen Saturation (%) Mean:   93%             O2 Saturation Range (%): between 85 and 99%                                      O2 Saturation (minutes)  <89%: less than 1 minutes.           Pulse Rate Statistics:   Pulse Mean (bpm):  60 bpm               Pulse Range:  Varied between 46 and 90 bpm              IMPRESSION:  This  HST confirms the presence of  very mild obstructive sleep apnea without clinically significant hypoxia of sleep.    RECOMMENDATION: The patient's headaches are unlikely to be related to sleep disordered breathing.   While CPAP can improve this mild apnea and snoring, it is less likely to help with insomnia concern and can worsen insomnia.   Sleeping on the right side was not associated with any apnea events scored by CMS criteria.  I recommend this positional change rather than CPAP therapy.  Insomnia, daytime sleepiness and lack of sleep routines are likely correlated. Trazodone  was prescribed to help with potential migraine prevention as well as with insomnia.  RV with headache diary and after MRI with NP or me.           INTERPRETING PHYSICIAN:   Neomia Banner, MD  [facility] Sleep Board certified by The [employer] of Sleep Medicine and Diplomate of the [employer] of Sleep Medicine. Board certified In Neurology through the ABPN, Fellow of the [employer] of Neu[employer].

## 2024-02-06 NOTE — Progress Notes (Signed)
 Please call and inform patient that MRI brain showed evidence of previous stroke and small vessel disease. There are no acute strokes. She should be takin aspirin 81 mg daily

## 2024-02-06 NOTE — Progress Notes (Signed)
 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

## 2024-02-07 NOTE — Telephone Encounter (Signed)
Called patient to discuss sleep study results. No answer at this time. LVM for the patient to call back.   

## 2024-02-07 NOTE — Telephone Encounter (Signed)
-----   Message from [place] Dohmeier sent at 02/06/2024  9:09 AM EDT ----- This HST confirms the presence of  very mild obstructive sleep apnea without clinically significant hypoxia of sleep. The patient's headaches are unlikely to be related to sleep disordered breathing.   Sleeping on the right side was not associated with any apnea events scored by CMS criteria.  I recommend this positional change rather than CPAP therapy.

## 2024-02-07 NOTE — Telephone Encounter (Signed)
**  When pt returns call please review SSR, MRI and MRA results

## 2024-02-08 ENCOUNTER — Ambulatory Visit

## 2024-02-11 ENCOUNTER — Ambulatory Visit: Admitting: Internal Medicine

## 2024-02-11 DIAGNOSIS — Z7952 Long term (current) use of systemic steroids: Secondary | ICD-10-CM

## 2024-02-11 DIAGNOSIS — M316 Other giant cell arteritis: Secondary | ICD-10-CM

## 2024-02-12 ENCOUNTER — Ambulatory Visit: Payer: Medicare Other | Admitting: Family Medicine

## 2024-02-13 ENCOUNTER — Ambulatory Visit: Admitting: Family Medicine

## 2024-02-19 ENCOUNTER — Ambulatory Visit (INDEPENDENT_AMBULATORY_CARE_PROVIDER_SITE_OTHER): Admitting: Nurse Practitioner

## 2024-02-19 ENCOUNTER — Encounter: Payer: Self-pay | Admitting: Nurse Practitioner

## 2024-02-19 VITALS — BP 125/72 | HR 82 | Temp 98.2°F | Ht 66.0 in | Wt 192.8 lb

## 2024-02-19 DIAGNOSIS — J300 Vasomotor rhinitis: Secondary | ICD-10-CM

## 2024-02-19 DIAGNOSIS — I10 Essential (primary) hypertension: Secondary | ICD-10-CM | POA: Diagnosis not present

## 2024-02-19 DIAGNOSIS — R600 Localized edema: Secondary | ICD-10-CM

## 2024-02-19 DIAGNOSIS — K219 Gastro-esophageal reflux disease without esophagitis: Secondary | ICD-10-CM

## 2024-02-19 MED ORDER — HYDROCHLOROTHIAZIDE 25 MG PO TABS: 25.0000 mg | ORAL_TABLET | Freq: Every day | ORAL | 0 refills | Status: DC

## 2024-02-19 NOTE — Progress Notes (Signed)
 Subjective:    Patient ID: Melissa Barajas, female    DOB: 1941/04/13, 83 y.o.   MRN: [id number]  HPI Presents for complaints of swelling in both lower legs that occurred rapidly while she was at home 2 days ago.  States she was sitting at home and felt the tightness in her legs.  Will go down at nighttime and better in the morning.  As she gets up and moves around the swelling comes back.  Denies any unusual shortness of breath or orthopnea.  No change in her diet.  No excessive sodium intake.  Completed prednisone  3 weeks ago for her headaches. Has had a slight occasional nonproductive cough.  No fever or chills.  No nausea or vomiting.  No sore throat.  Having a tight feeling in the upper mid epigastric area at times.  Describes as "a knot" that comes and goes.  Currently on Pepcid  and Protonix  for her GERD.  Tends to have frequent stools, usually a BM after eating.  No right upper quadrant pain.   Review of Systems  Constitutional:  Negative for chills and fever.  HENT:  Positive for congestion, postnasal drip and sinus pressure. Negative for ear pain and sore throat.   Respiratory:  Positive for cough. Negative for chest tightness, shortness of breath and wheezing.   Cardiovascular:  Positive for leg swelling. Negative for chest pain.  Gastrointestinal:  Positive for abdominal pain and diarrhea. Negative for blood in stool, constipation, nausea and vomiting.      02/19/2024   10:32 AM  Depression screen PHQ 2/9  Decreased Interest 0  Down, Depressed, Hopeless 0  PHQ - 2 Score 0  Altered sleeping 3  Tired, decreased energy 0  Change in appetite 0  Feeling bad or failure about yourself  0  Trouble concentrating 2  Moving slowly or fidgety/restless 0  Suicidal thoughts 0  PHQ-9 Score 5  Difficult doing work/chores Not difficult at all      02/19/2024   10:32 AM 10/24/2023    1:06 PM 08/17/2023   10:26 AM 06/11/2023   11:31 AM  GAD 7 : Generalized Anxiety Score  Nervous, Anxious,  on Edge 0 0 0 0  Control/stop worrying 0 0 0 0  Worry too much - different things 0 0 0 0  Trouble relaxing 0 0 0 0  Restless 0 0 0 0  Easily annoyed or irritable 0 0 0 0  Afraid - awful might happen 0 0 0 0  Total GAD 7 Score 0 0 0 0  Anxiety Difficulty  Not difficult at all  Not difficult at all    Social History   Tobacco Use   Smoking status: Former    Current packs/day: 0.00    Average packs/day: 1 pack/day for 15.0 years (15.0 ttl pk-yrs)    Types: Cigarettes    Start date: 09/12/1983    Quit date: 09/11/1993    Years since quitting: 30.4    Passive exposure: Never   Smokeless tobacco: Never  Vaping Use   Vaping status: Never Used  Substance Use Topics   Alcohol use: No    Alcohol/week: 0.0 standard drinks of alcohol   Drug use: No   No nicotine use.  No alcohol use.  No excessive NSAID use.     Objective:   Physical Exam NAD.  Alert, oriented.  Calm cheerful affect.  TMs clear effusion bilaterally, no erythema.  Nasal mucosa pale and boggy.  Pharynx mild irritation with cloudy  PND noted.  Neck supple with mild soft anterior cervical adenopathy.  Lungs clear.  Heart regular rate rhythm.  Abdomen soft nondistended with distinct epigastric area tenderness particularly in the upper area.  Lower extremities 2-3+ pitting edema.  Patient has significant varicose veins in both legs with venous stasis color changes in the lower legs and feet. Today's Vitals   02/19/24 1021  BP: 125/72  Pulse: 82  Temp: 98.2 F (36.8 C)  SpO2: 96%  Weight: 192 lb 12.8 oz (87.5 kg)  Height: 5\' 6"  (1.676 m)   Body mass index is 31.12 kg/m.        Assessment & Plan:   Problem List Items Addressed This Visit       Cardiovascular and Mediastinum   Essential hypertension - Primary   Relevant Medications   hydrochlorothiazide (HYDRODIURIL) 25 MG tablet   Other Relevant Orders   Comprehensive metabolic panel with GFR   Brain natriuretic peptide     Respiratory   Vasomotor rhinitis      Digestive   GERD   Relevant Orders   Ambulatory referral to Gastroenterology     Other   Edema of both lower extremities   Relevant Orders   Comprehensive metabolic panel with GFR   Brain natriuretic peptide   Meds ordered this encounter  Medications   hydrochlorothiazide (HYDRODIURIL) 25 MG tablet    Sig: Take 1 tablet (25 mg total) by mouth daily.    Dispense:  30 tablet    Refill:  0    Supervising Provider:   Charlotta Cook A [[id number]]   Since patient is already on Pepcid  and Protonix  with breakthrough symptoms, recommend evaluation by gastroenterology.  The flareup could be related to her long-term use of steroids recently.  Discussed lifestyle changes affecting her reflux. CMP and BNP ordered due to the sudden onset of the edema with no known specific cause. Start HCTZ 25 mg as directed.  Recommend elevation of the legs when she is at home.  Reviewed warning signs.  Further follow-up based on lab results and response to HCTZ, call back sooner if worsening or new symptoms. Recommend OTC Flonase as directed for head congestion.

## 2024-02-19 NOTE — Patient Instructions (Signed)
 Use Flonase nasal spray daily as directed.

## 2024-02-20 ENCOUNTER — Ambulatory Visit: Payer: Self-pay | Admitting: Nurse Practitioner

## 2024-02-20 LAB — COMPREHENSIVE METABOLIC PANEL WITH GFR
ALT: 15 IU/L (ref 0–32)
AST: 23 IU/L (ref 0–40)
Albumin: 4 g/dL (ref 3.8–4.8)
Alkaline Phosphatase: 94 IU/L (ref 44–121)
BUN/Creatinine Ratio: 8 — ABNORMAL LOW (ref 12–28)
BUN: 7 mg/dL — ABNORMAL LOW (ref 8–27)
Bilirubin Total: 0.5 mg/dL (ref 0.0–1.2)
CO2: 23 mmol/L (ref 20–29)
Calcium: 10.3 mg/dL (ref 8.7–10.3)
Chloride: 104 mmol/L (ref 96–106)
Creatinine, Ser: 0.87 mg/dL (ref 0.57–1.00)
Globulin, Total: 2.7 g/dL (ref 1.5–4.5)
Glucose: 107 mg/dL — ABNORMAL HIGH (ref 70–99)
Potassium: 4.5 mmol/L (ref 3.5–5.2)
Sodium: 144 mmol/L (ref 134–144)
Total Protein: 6.7 g/dL (ref 6.0–8.5)
eGFR: 67 mL/min/{1.73_m2} (ref >59)

## 2024-02-20 LAB — BRAIN NATRIURETIC PEPTIDE: BNP: 69.7 pg/mL (ref 0.0–100.0)

## 2024-02-20 NOTE — Telephone Encounter (Signed)
 Called the patient and was able to review the HST and MRI and MRA results of the brain. The patient was not aware of having had old stroke before which was noted on MRI brain. Informed her that it may have been very subtle and she didn't know it happened. There was no previous MRI brain or CT of brain to compare to. Advised that she would recommend she start 81 mg asa daily to help in further prevention of future strokes. Also educated the patient on keeping BP, cholesterol and A1C under control and manage this through PCP. Pt verbalized understanding.

## 2024-02-25 ENCOUNTER — Other Ambulatory Visit: Payer: Self-pay

## 2024-02-25 MED ORDER — FAMOTIDINE 40 MG PO TABS: 40.0000 mg | ORAL_TABLET | Freq: Every day | ORAL | 3 refills | Status: DC

## 2024-02-27 ENCOUNTER — Other Ambulatory Visit: Payer: Self-pay | Admitting: Family Medicine

## 2024-02-28 ENCOUNTER — Other Ambulatory Visit: Payer: Self-pay

## 2024-02-28 MED ORDER — FAMOTIDINE 40 MG PO TABS: 40.0000 mg | ORAL_TABLET | Freq: Every day | ORAL | 3 refills | Status: AC

## 2024-03-11 NOTE — Progress Notes (Signed)
 Office Visit Note  Patient: Melissa Barajas             Date of Birth: 1940/11/03           MRN: [id number]             PCP: Alphonsa Glendia LABOR, MD Referring: Alphonsa Glendia LABOR, MD Visit Date: 03/13/2024   Subjective:  Follow-up   Discussed the use of AI scribe software for clinical note transcription with the patient, who gave verbal consent to proceed.  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  She has experienced headaches twice since the beginning of June, which she associates with stress following the death of her father. She and her sister had been caring for him for the past two and a half years. The headaches have not recurred since then.  She underwent an MRI and a sleep study, both of which were normal. She was previously on prednisone , which initially provided energy and alleviated pain, but she has been off it for about five weeks, since a week before her father's passing. This was stopped earlier than originally planned due to delay in follow up, but did not see a major increase in symptom afterwards.  She reports numbness and stiffness in her hands for a few weeks. She does have morning pain and stiffness but not severely limiting activity. Her fingers are numb, and one finger was previously out of place but has since healed.  She has been experiencing edema in her legs, with fluid pooling in her ankles. Diuretics prescribed by another provider have helped reduce the swelling. No recent infections or illnesses.   Previous HPI 11/30/2023 Melissa Barajas is a 83 y.o. female here for follow up on her temporal HA. She was last seen in February and was having right temporal headaches that were sharp and improved after starting steroids. She started tapering her steroids and when she reached 3.5 tablets she started getting her headaches back. These are  intermittent, worse in the morning or wake her up at night. They will get better during the rest of the day. They are dull. She denies them improving when she gets fresh air. She does state that her husband used to tell her that she will snore loudly, has woken up choking, is obese and has a history of hypertension. She denies having any rhinorrhea, lacrimation, or facial droop. Denies having any muscle weakness but endorses fatigue going up the stairs after starting her steroids. She used her breo ellipta  not daily but now has been requiring daily. She has not had difficulty with repetitive movements, has not had difficulty getting up a chair, no difficulty getting up the stairs. She was using tylenol  daily extra strength more than 1000mg  a day consistently before her headaches started. She has cut back on this and is now taking it intermittently.    Previous HPI 10/29/23 Melissa Barajas is an 83 year old female here for evaluation and management of temporal arteritis who presents with recurrent headaches with associated tests elevated sedimentation rate and abnormal TA biopsy. She is accompanied by her sister.   She has been experiencing daily headaches since last summer, beginning around August or September. The pain originates from her temple and radiates to her eye. Initially, the headaches occurred twice daily, approximately every six to seven hours, and were managed with  extra strength Tylenol , providing relief for about six to seven hours. Over time, the frequency decreased to once a day. A biopsy on her head temporarily alleviated the headaches, but they have recently started to return, albeit less severe and not daily.   She is currently taking prednisone , 40 mg daily in the morning after eating, to manage her condition. Started since last week with Dr. Steven office. She experiences side effects such as difficulty sleeping. She has a history of taking prednisone  during a COVID-19 infection, which  caused her to feel 'strung out'. Her sister is concerned about the high dose of prednisone  and its potential side effects, including increased appetite, fluid retention, and elevated blood pressure. She acknowledges a tendency to crave sweets, which she is trying to manage.   She reportedly has a history of rheumatoid arthritis diagnosed at age 62, with symptoms including crooked fingers and occasional knee pain. She has two artificial knees and experiences swelling and pain in her feet, though these symptoms have lessened since she stopped working. Her sister also reports having rheumatoid arthritis.   She reports waking up with chest pain in the past few days. She does have history of acid reflux, for which she takes pantoprazole  40 mg although this is not in her medicine list. She also mentions an irregular heartbeat and is supposed to be on medication for it, though she is unsure of the specifics. EKG checked in January of this year was nonspecific.   Biopsy Result 10/03/23 A. RIGHT TEMPORAL ARTERY, BIOPSY:  Segment of artery with very focal loss of the elastic layer suggestive of healed arteritis  Negative for fibrosis, inflammation and giant cells      Labs reviewed   10/29/2023 ESR 19   05/2023 ESR 18   01/2023 ESR 60   11/2022 RF neg ESR 34   Review of Systems  Constitutional:  Negative for fatigue.  HENT:  Negative for mouth sores and mouth dryness.   Eyes:  Negative for dryness.  Respiratory:  Negative for shortness of breath.   Cardiovascular:  Negative for chest pain and palpitations.  Gastrointestinal:  Negative for blood in stool, constipation and diarrhea.  Endocrine: Positive for increased urination.  Genitourinary:  Negative for involuntary urination.  Musculoskeletal:  Positive for gait problem and morning stiffness. Negative for joint pain, joint pain, joint swelling, myalgias, muscle weakness, muscle tenderness and myalgias.  Skin:  Negative for color change,  rash, hair loss and sensitivity to sunlight.  Allergic/Immunologic: Negative for susceptible to infections.  Neurological:  Negative for dizziness and headaches.  Hematological:  Negative for swollen glands.  Psychiatric/Behavioral:  Positive for sleep disturbance. Negative for depressed mood. The patient is not nervous/anxious.     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   Urge incontinence 03/25/2013   Hyperlipidemia 08/25/2010   Essential hypertension 08/25/2010   GERD 08/25/2010    Past Medical History:  Diagnosis Date   Arthritis    Atrial fibrillation (HCC)    Remote prior history   Collagen vascular disease (HCC)  DIVERTICULOSIS OF COLON 08/25/2010   Qualifier: Diagnosis of  By: Mathew Osier     GERD (gastroesophageal reflux disease)    HEMORRHOIDS, INTERNAL 08/25/2010   Qualifier: Diagnosis of  By: Mathew Osier     Hypercholesteremia    Hypertension    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/03/2020   EGD and EGD US  [facility] 01/2020   Lipoma of arm    Left    Family History  Problem Relation Age of Onset   Liver disease Father    Arthritis Sister    Pulmonary disease Sister    Sleep apnea Sister    Arthritis Brother    Liver cancer Brother    Cancer Brother        mouth cancer   Cancer Brother    Cancer Brother    Healthy Daughter    Healthy Daughter    Arthritis Son    Hypertension Son    Diabetes Son        pre-diabetes   High Cholesterol Son    Leukemia Son    Past  Surgical History:  Procedure Laterality Date   ARTERY BIOPSY Right 10/03/2023   Procedure: BIOPSY TEMPORAL ARTERY;  Surgeon: Okey Burns, MD;  Location: [facility];  Service: ENT;  Laterality: Right;   BIOPSY  10/24/2019   Procedure: BIOPSY;  Surgeon: Harvey Margo CROME, MD;  Location: [facility];  Service: Endoscopy;;   BREAST BIOPSY Left    COLONOSCOPY  2009   SLF: 1. screening colonoscopy in 10 years 2. She should follow a high fiber diet She has been given a hand out on high fiber diverticulosois and hemorrhoids   COLONOSCOPY N/A 02/07/2018   Procedure: COLONOSCOPY;  Surgeon: Harvey Margo CROME, MD;  Location: [facility];  Service: Endoscopy;  Laterality: N/A;  1:00pm   CYSTECTOMY     Left breast   ESOPHAGOGASTRODUODENOSCOPY     MFM:[id number] appearing Schatzki's ring, possible cervical esophageal web, both dilated with passage of a Maloney dilator/small hiatal hernia   ESOPHAGOGASTRODUODENOSCOPY N/A 10/25/2015   Procedure: ESOPHAGOGASTRODUODENOSCOPY (EGD);  Surgeon: Margo CROME Harvey, MD;  Location: [facility];  Service: Endoscopy;  Laterality: N/A;  1215pm   ESOPHAGOGASTRODUODENOSCOPY N/A 10/24/2019   Schatzki's ring s/p dilation and single medium firm, probable submucosal nodule without bleeding. Medium sized hiatal hernia, normal duodenum.    ESOPHAGOGASTRODUODENOSCOPY N/A 04/05/2020   10 mm leiomyoma and capsule study without obvious lesion or bleeding.   EUS  11/2019   Dr. Burnette: EUS completed March 2021 with likely leiomyoma, no other concerns. 10 mm by 10 mm.    GIVENS CAPSULE STUDY N/A 04/06/2020   Procedure: GIVENS CAPSULE STUDY;  Surgeon: Golda Claudis PENNER, MD;  Location: [facility];  Service: Endoscopy;  Laterality: N/A;   KNEE ARTHROPLASTY Left 2007   KNEE ARTHROPLASTY Right 2009   POLYPECTOMY  02/07/2018   Procedure: POLYPECTOMY;  Surgeon: Harvey Margo CROME, MD;  Location: [facility];  Service: Endoscopy;;  ascending colon, descending, sigmoid   SAVORY DILATION N/A  10/25/2015   Procedure: SAVORY DILATION;  Surgeon: Margo CROME Harvey, MD;  Location: [facility];  Service: Endoscopy;  Laterality: N/A;   SAVORY DILATION N/A 10/24/2019   Procedure: SAVORY DILATION;  Surgeon: Harvey Margo CROME, MD;  [facility]: [facility];  Service: Endoscopy;  Laterality: N/A;   THYROIDECTOMY     Tumor   05/2010   Left breast under nipple   VAGINAL HYSTERECTOMY     Partial  VESICOVAGINAL FISTULA CLOSURE W/ TAH     fibroid tumors   Social History   Social History Narrative   01/26/23: Patient is currently helping to care for her brother who has cancer.    Immunization History  Administered Date(s) Administered   Fluad Quad(high Dose 65+) 06/29/2021, 05/26/2022   Fluad Trivalent(High Dose 65+) 06/11/2023   Influenza Split 07/08/2013   Influenza, High Dose Seasonal PF 06/16/2019   Influenza,inj,Quad PF,6+ Mos 05/25/2014, 06/15/2015, 06/22/2016, 06/19/2017, 06/21/2018   Influenza,inj,quad, With Preservative 06/11/2017   Influenza-Unspecified 05/13/2011, 06/25/2020   PFIZER(Purple Top)SARS-COV-2 Vaccination 10/01/2019, 10/22/2019, 06/10/2020   Pneumococcal Conjugate-13 10/12/2014   Pneumococcal Polysaccharide-23 07/01/2008   Zoster, Live 07/01/2008     Objective: Vital Signs: BP 122/73 (BP Location: Left Arm, Patient Position: Sitting, Cuff Size: Normal)   Pulse 94   Resp 14   Ht 5' 6 (1.676 m)   Wt 189 lb (85.7 kg)   BMI 30.51 kg/m    Physical Exam Eyes:     Conjunctiva/sclera: Conjunctivae normal.  Cardiovascular:     Rate and Rhythm: Normal rate and regular rhythm.  Pulmonary:     Effort: Pulmonary effort is normal.     Breath sounds: Normal breath sounds.  Skin:    General: Skin is warm and dry.     Comments: Bilateral trace pitting edema, skin hyperpigmentation  Neurological:     Mental Status: She is alert.  Psychiatric:        Mood and Affect: Mood normal.      Musculoskeletal Exam:  No tenderness to pressure over temporal areas, no jaw  pain Neck full ROM no tenderness Shoulders full ROM no tenderness or swelling Elbows full ROM no tenderness or swelling Wrists full ROM no tenderness or swelling Fingers full ROM, right 2nd MCP tenderness without swelling, mild right MCP lateral deviations No paraspinal tenderness to palpation over upper and lower back Hip normal internal and external rotation without pain, no tenderness to lateral hip palpation Knees full ROM no tenderness or swelling Ankles full ROM no tenderness or swelling MTPs full ROM no tenderness or swelling  Investigation: No additional findings.  Imaging: No results found.  Recent Labs: Lab Results  Component Value Date   WBC 8.3 03/19/2024   HGB 13.7 03/19/2024   PLT 239 03/19/2024   NA 144 02/19/2024   K 4.5 02/19/2024   CL 104 02/19/2024   CO2 23 02/19/2024   GLUCOSE 107 (H) 02/19/2024   BUN 7 (L) 02/19/2024   CREATININE 0.87 02/19/2024   BILITOT 0.5 02/19/2024   ALKPHOS 94 02/19/2024   AST 23 02/19/2024   ALT 15 02/19/2024   PROT 6.7 02/19/2024   ALBUMIN 4.0 02/19/2024   CALCIUM  10.3 02/19/2024   GFRAA 81 08/24/2020    Speciality Comments: No specialty comments available.  Procedures:  No procedures performed Allergies: Aricept  [donepezil  hcl], Hydrocodone, Lisinopril, Naprosyn [naproxen], Oxycodone , and Rofecoxib   Assessment / Plan:     Visit Diagnoses: Temporal arteritis syndrome (HCC) - Plan: Sedimentation rate, C-reactive protein Appears to be doing very well entirely off prednisone  at this point for 5 weeks duration. Headaches now infrequent and stress related. Also distribution is more posterior when discussed in detail. Increase in hand pain seems to be unmasking of existing OA symptoms. No proximal muscle weakness or stiffness increase. No visual symptoms at all and has not noted any evidence of arteritic ischemic optic neuropathy. 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 Symptoms of numbness and stiffness have increased. - Discuss potential reintroduction of low-dose prednisone  if symptoms persist.  Edema due to venous insufficiency Chronic lower extremity edema due to venous insufficiency, causing stasis dermatitis. Not related to cardiac or renal issues. - Encourage walking and exercise to improve venous return. - Advise against prolonged sitting or standing with feet down. - Recommend elevating feet when sitting for long periods. - Discuss the use of compression stockings to manage edema. - Continue diuretic therapy as prescribed by Elveria.  Stasis dermatitis Stasis dermatitis secondary to chronic venous insufficiency with skin changes due to fluid pooling. - Provide educational materials on stasis dermatitis.    Orders: Orders Placed This Encounter  Procedures   Sedimentation rate   C-reactive protein   No orders of the defined types were placed in this encounter.    Follow-Up Instructions: Return in about 6 months (around 09/13/2024) for GCA obs f/u 6mos.   Lonni LELON Ester, MD  Note - This record has been created using [employer].  Chart creation errors have been sought, but may not always  have been located. Such creation errors do not reflect on  the standard of medical care.

## 2024-03-13 ENCOUNTER — Encounter: Payer: Self-pay | Admitting: Internal Medicine

## 2024-03-13 ENCOUNTER — Ambulatory Visit: Attending: Internal Medicine | Admitting: Internal Medicine

## 2024-03-13 VITALS — BP 122/73 | HR 94 | Resp 14 | Ht 66.0 in | Wt 189.0 lb

## 2024-03-13 DIAGNOSIS — M316 Other giant cell arteritis: Secondary | ICD-10-CM | POA: Diagnosis not present

## 2024-03-14 LAB — C-REACTIVE PROTEIN: CRP: 3.1 mg/L (ref <8.0)

## 2024-03-14 LAB — SEDIMENTATION RATE: Sed Rate: 25 mm/h (ref 0–30)

## 2024-03-19 ENCOUNTER — Encounter: Payer: Self-pay | Admitting: Family Medicine

## 2024-03-19 ENCOUNTER — Other Ambulatory Visit: Payer: Self-pay | Admitting: Family Medicine

## 2024-03-19 ENCOUNTER — Ambulatory Visit (INDEPENDENT_AMBULATORY_CARE_PROVIDER_SITE_OTHER): Admitting: Family Medicine

## 2024-03-19 VITALS — BP 128/74 | HR 91 | Temp 97.9°F | Ht 66.0 in | Wt 189.6 lb

## 2024-03-19 DIAGNOSIS — N63 Unspecified lump in unspecified breast: Secondary | ICD-10-CM

## 2024-03-19 DIAGNOSIS — R195 Other fecal abnormalities: Secondary | ICD-10-CM

## 2024-03-19 MED ORDER — HYDROCHLOROTHIAZIDE 25 MG PO TABS: 25.0000 mg | ORAL_TABLET | Freq: Every day | ORAL | 4 refills | Status: DC

## 2024-03-19 NOTE — Telephone Encounter (Signed)
 Refill on hydrochlorothiazide  (HYDRODIURIL ) 25 MG tablet  [facility]

## 2024-03-19 NOTE — Progress Notes (Signed)
   Subjective:    Patient ID: Melissa Barajas, female    DOB: 09-06-1941, 83 y.o.   MRN: [id number]  HPI Discussed the use of AI scribe software for clinical note transcription with the patient, who gave verbal consent to proceed.  History of Present Illness   The patient presents with a left breast nodule and dark stools.  She noticed a tender knot in her left breast on Friday night, which causes pain when pressed. There is no drainage from the area, and she is seeking further evaluation due to concern about the nodule.  She reports experiencing dark stools, despite not consuming strawberries or blueberries in the past couple of weeks. She recalls taking Bethovezomol, which can affect stool color, but believes it should have cleared by now. The last occurrence of dark stool was yesterday. Her last colonoscopy was in 2019.  She has a history of arthritis in her right hand, diagnosed by a rheumatologist. Her hand was swollen and painful yesterday, rendering it unusable. She is not currently taking prednisone . She mentions experiencing headaches twice since her brother's passing, which she attributes to stress, but she has no headaches currently.        Review of Systems     Objective:   Physical Exam  General-in no acute distress Eyes-no discharge Lungs-respiratory rate normal, CTA CV-no murmurs,RRR Extremities skin warm dry no edema Neuro grossly normal Behavior normal, alert Breast nodule noted on left side near the superior nipple region 12:00  On exam she does have visible swelling in the 1st, 2nd and 3rd MCP on the right side some decreased range of motion associated with this    Assessment & Plan:  1. Breast nodule (Primary) Patient to get diagnostic mammogram and ultrasound Will need further looking into Possible biopsy - MM Digital Diagnostic Unilat L - US  BREAST COMPLETE UNI LEFT INC AXILLA  2. Dark stools Stool tests ordered CBC ordered further testing depending on  this - CBC with Differential - IFOBT POC (occult bld, rslt in office); Future  Right hand arthralgia-she does have some swelling in the joints this does raise to question if this is osteoarthritis versus something a little more concerning such as an inflammatory arthritis her sed rate and CRP are reassuring she is not on any prednisone  we will try to keep her off of prednisone  if possible She is no longer having headaches she thinks that was related to the death of her family member I will reach out to rheumatology to see if they have any additional requests or input otherwise await the findings of the above

## 2024-03-20 ENCOUNTER — Ambulatory Visit: Payer: Self-pay | Admitting: Family Medicine

## 2024-03-20 ENCOUNTER — Other Ambulatory Visit (HOSPITAL_COMMUNITY): Payer: Self-pay | Admitting: Family Medicine

## 2024-03-20 DIAGNOSIS — N63 Unspecified lump in unspecified breast: Secondary | ICD-10-CM

## 2024-03-20 LAB — CBC WITH DIFFERENTIAL/PLATELET
Basophils Absolute: 0.1 x10E3/uL (ref 0.0–0.2)
Basos: 1 %
EOS (ABSOLUTE): 0.3 x10E3/uL (ref 0.0–0.4)
Eos: 4 %
Hematocrit: 43.5 % (ref 34.0–46.6)
Hemoglobin: 13.7 g/dL (ref 11.1–15.9)
Immature Grans (Abs): 0 x10E3/uL (ref 0.0–0.1)
Immature Granulocytes: 0 %
Lymphocytes Absolute: 2.2 x10E3/uL (ref 0.7–3.1)
Lymphs: 26 %
MCH: 31.4 pg (ref 26.6–33.0)
MCHC: 31.5 g/dL (ref 31.5–35.7)
MCV: 100 fL — ABNORMAL HIGH (ref 79–97)
Monocytes Absolute: 1.1 x10E3/uL — ABNORMAL HIGH (ref 0.1–0.9)
Monocytes: 13 %
Neutrophils Absolute: 4.6 x10E3/uL (ref 1.4–7.0)
Neutrophils: 56 %
Platelets: 239 x10E3/uL (ref 150–450)
RBC: 4.37 x10E6/uL (ref 3.77–5.28)
RDW: 12.8 % (ref 11.7–15.4)
WBC: 8.3 x10E3/uL (ref 3.4–10.8)

## 2024-03-24 ENCOUNTER — Inpatient Hospital Stay
Admission: RE | Admit: 2024-03-24 | Discharge: 2024-03-24 | Disposition: A | Discharge status: Home / Self Care | Payer: Self-pay | Source: Ambulatory Visit | Attending: Family Medicine | Admitting: Family Medicine

## 2024-03-24 ENCOUNTER — Other Ambulatory Visit: Payer: Self-pay

## 2024-03-24 ENCOUNTER — Encounter (INDEPENDENT_AMBULATORY_CARE_PROVIDER_SITE_OTHER): Payer: Self-pay | Admitting: *Deleted

## 2024-03-24 ENCOUNTER — Other Ambulatory Visit: Payer: Self-pay | Admitting: Family Medicine

## 2024-03-24 DIAGNOSIS — N63 Unspecified lump in unspecified breast: Secondary | ICD-10-CM

## 2024-03-24 DIAGNOSIS — Z1239 Encounter for other screening for malignant neoplasm of breast: Secondary | ICD-10-CM

## 2024-03-24 MED ORDER — POTASSIUM CHLORIDE CRYS ER 10 MEQ PO TBCR: 10.0000 meq | EXTENDED_RELEASE_TABLET | Freq: Every day | ORAL | 1 refills | Status: DC

## 2024-03-28 ENCOUNTER — Ambulatory Visit (INDEPENDENT_AMBULATORY_CARE_PROVIDER_SITE_OTHER): Admitting: Gastroenterology

## 2024-03-28 ENCOUNTER — Encounter (INDEPENDENT_AMBULATORY_CARE_PROVIDER_SITE_OTHER): Payer: Self-pay | Admitting: Gastroenterology

## 2024-03-28 VITALS — BP 129/76 | HR 78 | Temp 97.1°F | Ht 66.0 in | Wt 187.3 lb

## 2024-03-28 DIAGNOSIS — R10816 Epigastric abdominal tenderness: Secondary | ICD-10-CM | POA: Diagnosis not present

## 2024-03-28 DIAGNOSIS — D214 Benign neoplasm of connective and other soft tissue of abdomen: Secondary | ICD-10-CM

## 2024-03-28 DIAGNOSIS — K921 Melena: Secondary | ICD-10-CM | POA: Diagnosis not present

## 2024-03-28 DIAGNOSIS — K219 Gastro-esophageal reflux disease without esophagitis: Secondary | ICD-10-CM

## 2024-03-28 NOTE — H&P (View-Only) (Signed)
 Melissa Brotherton Faizan Pravin Barajas , M.D. Gastroenterology & Hepatology [facility] [address] [place], [place] [address] Primary Care Physician: Alphonsa Glendia LABOR, MD [address] [place] [address]  Chief Complaint: Black-colored stools, epigastric pain  History of Present Illness:  Melissa Barajas is a 83 y.o. 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  Patient at that time had EUS with Dr. Burnette.  Scanned in media.  Also had a capsule endoscopy in 2021  Patient reports epigastric pain which is intermittent no relieving or aggravating factors.  She was on prednisone  but  denies any NSAID use Patient has been taking pantoprazole  in the morning and famotidine  at night.patient has noticed liquid black stools intermittently as well.The patient denies having any nausea, vomiting, fever, chills,  hematemesis, abdominal distention,diarrhea, jaundice, pruritus or weight loss.  Most recent labs from 02/19/2024 and 03/19/2024 hemoglobin 13.7 CMP BUN 7 creatinine 0.7 Last ZHI:[id number]  - Normal hypopharynx. - Normal esophagus. - Z- line irregular, 35 cm from the incisors. Focal erythema at GEJ. - 3 cm hiatal hernia. - Benign gastric tumor in the gastric fundus previously confirmed to be leiomyoma( EUS 03/ 2021) . - Normal duodenal bulb and second portion of the duodenum. - No specimens collected.  Capsule :  Scan stool present in distal small bowel, colon with copious amount of greenish stool. No evidence of lesions or active bleeding, no melena.   EUS:    Last Colonoscopy:2019  - The examined portion of the ileum was normal. - Two 2 to 3 mm polyps in the sigmoid colon and in the descending colon, removed with a cold biopsy forceps. Resected and retrieved. - One 6 mm polyp in the ascending colon,  removed with a cold snare. Resected and retrieved. - Diverticulosis in the recto- sigmoid colon and in the sigmoid colon. - RECTAL BLEEDING DUE TO Internal hemorrhoids. - External hemorrhoids.  No further repeat suggested  Past Medical History: Past Medical History:  Diagnosis Date   Arthritis    Atrial fibrillation (HCC)    Remote prior history   Collagen vascular disease (HCC)    DIVERTICULOSIS OF COLON 08/25/2010   Qualifier: Diagnosis of  By: Mathew Osier     GERD (gastroesophageal reflux disease)    HEMORRHOIDS, INTERNAL 08/25/2010   Qualifier: Diagnosis of  By: Mathew Osier     Hypercholesteremia    Hypertension    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/03/2020   EGD and EGD US  [facility] 01/2020   Lipoma of arm    Left    Past Surgical History: Past Surgical History:  Procedure Laterality Date   ARTERY BIOPSY Right 10/03/2023   Procedure: BIOPSY TEMPORAL ARTERY;  Surgeon: Okey Burns, MD;  [facility]: [facility];  Service: ENT;  Laterality: Right;   BIOPSY  10/24/2019   Procedure: BIOPSY;  Surgeon: Harvey Margo CROME, MD;  [facility];  Service: Endoscopy;;   BREAST BIOPSY Left    COLONOSCOPY  2009   SLF: 1. screening colonoscopy in 10 years 2. She should follow a high fiber diet She has been given a hand out on high fiber diverticulosois and hemorrhoids   COLONOSCOPY N/A 02/07/2018   Procedure: COLONOSCOPY;  Surgeon: Harvey Margo CROME, MD;  [facility];  Service: Endoscopy;  Laterality: N/A;  1:00pm  CYSTECTOMY     Left breast   ESOPHAGOGASTRODUODENOSCOPY     MFM:Wnwrmpuprjo appearing Schatzki's ring, possible cervical esophageal web, both dilated with passage of a Maloney dilator/small hiatal hernia   ESOPHAGOGASTRODUODENOSCOPY N/A 10/25/2015   Procedure: ESOPHAGOGASTRODUODENOSCOPY (EGD);  Surgeon: Margo LITTIE Haddock, MD;  [facility];  Service: Endoscopy;  Laterality: N/A;  1215pm   ESOPHAGOGASTRODUODENOSCOPY N/A 10/24/2019    Schatzki's ring s/p dilation and single medium firm, probable submucosal nodule without bleeding. Medium sized hiatal hernia, normal duodenum.    ESOPHAGOGASTRODUODENOSCOPY N/A 04/05/2020   10 mm leiomyoma and capsule study without obvious lesion or bleeding.   EUS  11/2019   Dr. Burnette: EUS completed March 2021 with likely leiomyoma, no other concerns. 10 mm by 10 mm.    GIVENS CAPSULE STUDY N/A 04/06/2020   Procedure: GIVENS CAPSULE STUDY;  Surgeon: Golda Claudis PENNER, MD;  [facility];  Service: Endoscopy;  Laterality: N/A;   KNEE ARTHROPLASTY Left 2007   KNEE ARTHROPLASTY Right 2009   POLYPECTOMY  02/07/2018   Procedure: POLYPECTOMY;  Surgeon: Haddock Margo LITTIE, MD;  [facility];  Service: Endoscopy;;  ascending colon, descending, sigmoid   SAVORY DILATION N/A 10/25/2015   Procedure: SAVORY DILATION;  Surgeon: Margo LITTIE Haddock, MD;  [facility];  Service: Endoscopy;  Laterality: N/A;   SAVORY DILATION N/A 10/24/2019   Procedure: SAVORY DILATION;  Surgeon: Haddock Margo LITTIE, MD;  [facility];  Service: Endoscopy;  Laterality: N/A;   THYROIDECTOMY     Tumor   05/2010   Left breast under nipple   VAGINAL HYSTERECTOMY     Partial   VESICOVAGINAL FISTULA CLOSURE W/ TAH     fibroid tumors    Family History: Family History  Problem Relation Age of Onset   Liver disease Father    Arthritis Sister    Pulmonary disease Sister    Sleep apnea Sister    Arthritis Brother    Liver cancer Brother    Cancer Brother        mouth cancer   Cancer Brother    Cancer Brother    Healthy Daughter    Healthy Daughter    Arthritis Son    Hypertension Son    Diabetes Son        pre-diabetes   High Cholesterol Son    Leukemia Son     Social History: Social History   Tobacco Use  Smoking Status Former   Current packs/day: 0.00   Average packs/day: 1 pack/day for 15.0 years (15.0 ttl pk-yrs)   Types: Cigarettes   Start date: 09/12/1983   Quit  date: 09/11/1993   Years since quitting: 30.5   Passive exposure: Never  Smokeless Tobacco Never   Social History   Substance and Sexual Activity  Alcohol Use No   Alcohol/week: 0.0 standard drinks of alcohol   Social History   Substance and Sexual Activity  Drug Use No    Allergies: Allergies  Allergen Reactions   Aricept  [Donepezil  Hcl]     Sleep deprived   Hydrocodone Nausea Only   Lisinopril Other (See Comments)    Angio edema   Naprosyn [Naproxen] Nausea Only   Oxycodone      GI Intolerance   Rofecoxib     Unknown reaction    Medications: Current Outpatient Medications  Medication Sig Dispense Refill   acetaminophen  (TYLENOL ) 500 MG tablet Take 1,000 mg by mouth 2 (two) times daily as needed for headache or  moderate pain (pain score 4-6).     amLODipine  (NORVASC ) 10 MG tablet TAKE 1 TABLET(10MG ) BY MOUTH DAILY 90 tablet 1   BREO ELLIPTA  200-25 MCG/ACT AEPB USE 1 INHALATION BY MOUTH DAILY 180 each 0   cloNIDine  (CATAPRES ) 0.2 MG tablet Take 1 tablet (0.2 mg total) by mouth 2 (two) times daily. 180 tablet 2   famotidine  (PEPCID ) 40 MG tablet Take 1 tablet (40 mg total) by mouth daily. 90 tablet 3   hydrochlorothiazide  (HYDRODIURIL ) 25 MG tablet Take 1 tablet (25 mg total) by mouth daily. 30 tablet 4   OVER THE COUNTER MEDICATION Z Quil at bedtime sleep.     pantoprazole  (PROTONIX ) 40 MG tablet Take 1 tablet (40 mg total) by mouth daily. 90 tablet 0   potassium chloride  (KLOR-CON  M) 10 MEQ tablet Take 1 tablet (10 mEq total) by mouth daily. 90 tablet 1   rosuvastatin  (CRESTOR ) 20 MG tablet TAKE 1 TABLET BY MOUTH ON MONDAYS AND FRIDAYS 26 tablet 4   No current facility-administered medications for this visit.    Review of Systems: GENERAL: negative for malaise, night sweats HEENT: No changes in hearing or vision, no nose bleeds or other nasal problems. NECK: Negative for lumps, goiter, pain and significant neck swelling RESPIRATORY: Negative for cough,  wheezing CARDIOVASCULAR: Negative for chest pain, leg swelling, palpitations, orthopnea GI: SEE HPI MUSCULOSKELETAL: Negative for joint pain or swelling, back pain, and muscle pain. SKIN: Negative for lesions, rash HEMATOLOGY Negative for prolonged bleeding, bruising easily, and swollen nodes. ENDOCRINE: Negative for cold or heat intolerance, polyuria, polydipsia and goiter. NEURO: negative for tremor, gait imbalance, syncope and seizures. The remainder of the review of systems is noncontributory.   Physical Exam: BP 129/76 (BP Location: Left Arm, Patient Position: Sitting, Cuff Size: Normal)   Pulse 78   Temp (!) 97.1 F (36.2 C) (Temporal)   Ht 5' 6 (1.676 m)   Wt 187 lb 4.8 oz (85 kg)   BMI 30.23 kg/m  GENERAL: The patient is AO x3, in no acute distress. HEENT: Head is normocephalic and atraumatic. EOMI are intact. Mouth is well hydrated and without lesions. NECK: Supple. No masses LUNGS: Clear to auscultation. No presence of rhonchi/wheezing/rales. Adequate chest expansion HEART: RRR, normal s1 and s2. ABDOMEN: Soft, epigastric tenderness, no guarding, no peritoneal signs, and nondistended. BS +. No masses.   Imaging/Labs: as above     Latest Ref Rng & Units 03/19/2024   12:20 PM 09/28/2023   10:56 AM 02/11/2023    1:56 PM  CBC  WBC 3.4 - 10.8 x10E3/uL 8.3  7.8  7.5   Hemoglobin 11.1 - 15.9 g/dL 86.2  85.4  86.6   Hematocrit 34.0 - 46.6 % 43.5  44.6  40.1   Platelets 150 - 450 x10E3/uL 239  253  279    No results found for: IRON, TIBC, FERRITIN  I personally reviewed and interpreted the available labs, imaging and endoscopic files.  Impression and Plan:  Melissa Barajas is a 83 y.o. 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  Does not take any NSAID but had exposure to steroids . This could be peptic ulcer disease  Last upper endoscopy 2021 with submucosal lesion deemed to be leiomyoma as per US  report  from 2021 by Dr. Burnette  Upper endoscopy to evaluate for peptic ulcer disease or worsening of tumor as it can improve and cause symptoms  Continue with PPI  I thoroughly discussed with the patient the procedure, including the risks involved. Patient understands what the procedure involves including the benefits and any risks. Patient understands alternatives to the proposed procedure. Risks including (but not limited to) bleeding, tearing of the lining (perforation), rupture of adjacent organs, problems with heart and lung function, infection, and medication reactions. A small percentage of complications may require surgery, hospitalization, repeat endoscopic procedure, and/or transfusion.  Patient understood and agreed.   all questions were answered.      Carsin Randazzo Faizan Saretta Dahlem, MD Gastroenterology and Hepatology [facility]   This chart has been completed using [facility] Dictation software, and while attempts have been made to ensure accuracy , certain words and phrases may not be transcribed as intended

## 2024-03-28 NOTE — Progress Notes (Signed)
 Zabrina Brotherton Faizan Pravin Perezperez , M.D. Gastroenterology & Hepatology [facility] [address] [place], [place] [address] Primary Care Physician: Alphonsa Glendia LABOR, MD [address] [place] [address]  Chief Complaint: Black-colored stools, epigastric pain  History of Present Illness:  Melissa Barajas is a 83 y.o. 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  Patient at that time had EUS with Dr. Burnette.  Scanned in media.  Also had a capsule endoscopy in 2021  Patient reports epigastric pain which is intermittent no relieving or aggravating factors.  She was on prednisone  but  denies any NSAID use Patient has been taking pantoprazole  in the morning and famotidine  at night.patient has noticed liquid black stools intermittently as well.The patient denies having any nausea, vomiting, fever, chills,  hematemesis, abdominal distention,diarrhea, jaundice, pruritus or weight loss.  Most recent labs from 02/19/2024 and 03/19/2024 hemoglobin 13.7 CMP BUN 7 creatinine 0.7 Last ZHI:[id number]  - Normal hypopharynx. - Normal esophagus. - Z- line irregular, 35 cm from the incisors. Focal erythema at GEJ. - 3 cm hiatal hernia. - Benign gastric tumor in the gastric fundus previously confirmed to be leiomyoma( EUS 03/ 2021) . - Normal duodenal bulb and second portion of the duodenum. - No specimens collected.  Capsule :  Scan stool present in distal small bowel, colon with copious amount of greenish stool. No evidence of lesions or active bleeding, no melena.   EUS:    Last Colonoscopy:2019  - The examined portion of the ileum was normal. - Two 2 to 3 mm polyps in the sigmoid colon and in the descending colon, removed with a cold biopsy forceps. Resected and retrieved. - One 6 mm polyp in the ascending colon,  removed with a cold snare. Resected and retrieved. - Diverticulosis in the recto- sigmoid colon and in the sigmoid colon. - RECTAL BLEEDING DUE TO Internal hemorrhoids. - External hemorrhoids.  No further repeat suggested  Past Medical History: Past Medical History:  Diagnosis Date   Arthritis    Atrial fibrillation (HCC)    Remote prior history   Collagen vascular disease (HCC)    DIVERTICULOSIS OF COLON 08/25/2010   Qualifier: Diagnosis of  By: Mathew Osier     GERD (gastroesophageal reflux disease)    HEMORRHOIDS, INTERNAL 08/25/2010   Qualifier: Diagnosis of  By: Mathew Osier     Hypercholesteremia    Hypertension    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/03/2020   EGD and EGD US  [facility] 01/2020   Lipoma of arm    Left    Past Surgical History: Past Surgical History:  Procedure Laterality Date   ARTERY BIOPSY Right 10/03/2023   Procedure: BIOPSY TEMPORAL ARTERY;  Surgeon: Okey Burns, MD;  Location: [facility];  Service: ENT;  Laterality: Right;   BIOPSY  10/24/2019   Procedure: BIOPSY;  Surgeon: Harvey Margo CROME, MD;  [facility];  Service: Endoscopy;;   BREAST BIOPSY Left    COLONOSCOPY  2009   SLF: 1. screening colonoscopy in 10 years 2. She should follow a high fiber diet She has been given a hand out on high fiber diverticulosois and hemorrhoids   COLONOSCOPY N/A 02/07/2018   Procedure: COLONOSCOPY;  Surgeon: Harvey Margo CROME, MD;  [facility];  Service: Endoscopy;  Laterality: N/A;  1:00pm  CYSTECTOMY     Left breast   ESOPHAGOGASTRODUODENOSCOPY     MFM:Wnwrmpuprjo appearing Schatzki's ring, possible cervical esophageal web, both dilated with passage of a Maloney dilator/small hiatal hernia   ESOPHAGOGASTRODUODENOSCOPY N/A 10/25/2015   Procedure: ESOPHAGOGASTRODUODENOSCOPY (EGD);  Surgeon: Margo LITTIE Haddock, MD;  [facility];  Service: Endoscopy;  Laterality: N/A;  1215pm   ESOPHAGOGASTRODUODENOSCOPY N/A 10/24/2019    Schatzki's ring s/p dilation and single medium firm, probable submucosal nodule without bleeding. Medium sized hiatal hernia, normal duodenum.    ESOPHAGOGASTRODUODENOSCOPY N/A 04/05/2020   10 mm leiomyoma and capsule study without obvious lesion or bleeding.   EUS  11/2019   Dr. Burnette: EUS completed March 2021 with likely leiomyoma, no other concerns. 10 mm by 10 mm.    GIVENS CAPSULE STUDY N/A 04/06/2020   Procedure: GIVENS CAPSULE STUDY;  Surgeon: Golda Claudis PENNER, MD;  [facility];  Service: Endoscopy;  Laterality: N/A;   KNEE ARTHROPLASTY Left 2007   KNEE ARTHROPLASTY Right 2009   POLYPECTOMY  02/07/2018   Procedure: POLYPECTOMY;  Surgeon: Haddock Margo LITTIE, MD;  [facility];  Service: Endoscopy;;  ascending colon, descending, sigmoid   SAVORY DILATION N/A 10/25/2015   Procedure: SAVORY DILATION;  Surgeon: Margo LITTIE Haddock, MD;  [facility];  Service: Endoscopy;  Laterality: N/A;   SAVORY DILATION N/A 10/24/2019   Procedure: SAVORY DILATION;  Surgeon: Haddock Margo LITTIE, MD;  [facility];  Service: Endoscopy;  Laterality: N/A;   THYROIDECTOMY     Tumor   05/2010   Left breast under nipple   VAGINAL HYSTERECTOMY     Partial   VESICOVAGINAL FISTULA CLOSURE W/ TAH     fibroid tumors    Family History: Family History  Problem Relation Age of Onset   Liver disease Father    Arthritis Sister    Pulmonary disease Sister    Sleep apnea Sister    Arthritis Brother    Liver cancer Brother    Cancer Brother        mouth cancer   Cancer Brother    Cancer Brother    Healthy Daughter    Healthy Daughter    Arthritis Son    Hypertension Son    Diabetes Son        pre-diabetes   High Cholesterol Son    Leukemia Son     Social History: Social History   Tobacco Use  Smoking Status Former   Current packs/day: 0.00   Average packs/day: 1 pack/day for 15.0 years (15.0 ttl pk-yrs)   Types: Cigarettes   Start date: 09/12/1983   Quit  date: 09/11/1993   Years since quitting: 30.5   Passive exposure: Never  Smokeless Tobacco Never   Social History   Substance and Sexual Activity  Alcohol Use No   Alcohol/week: 0.0 standard drinks of alcohol   Social History   Substance and Sexual Activity  Drug Use No    Allergies: Allergies  Allergen Reactions   Aricept  [Donepezil  Hcl]     Sleep deprived   Hydrocodone Nausea Only   Lisinopril Other (See Comments)    Angio edema   Naprosyn [Naproxen] Nausea Only   Oxycodone      GI Intolerance   Rofecoxib     Unknown reaction    Medications: Current Outpatient Medications  Medication Sig Dispense Refill   acetaminophen  (TYLENOL ) 500 MG tablet Take 1,000 mg by mouth 2 (two) times daily as needed for headache or  moderate pain (pain score 4-6).     amLODipine  (NORVASC ) 10 MG tablet TAKE 1 TABLET(10MG ) BY MOUTH DAILY 90 tablet 1   BREO ELLIPTA  200-25 MCG/ACT AEPB USE 1 INHALATION BY MOUTH DAILY 180 each 0   cloNIDine  (CATAPRES ) 0.2 MG tablet Take 1 tablet (0.2 mg total) by mouth 2 (two) times daily. 180 tablet 2   famotidine  (PEPCID ) 40 MG tablet Take 1 tablet (40 mg total) by mouth daily. 90 tablet 3   hydrochlorothiazide  (HYDRODIURIL ) 25 MG tablet Take 1 tablet (25 mg total) by mouth daily. 30 tablet 4   OVER THE COUNTER MEDICATION Z Quil at bedtime sleep.     pantoprazole  (PROTONIX ) 40 MG tablet Take 1 tablet (40 mg total) by mouth daily. 90 tablet 0   potassium chloride  (KLOR-CON  M) 10 MEQ tablet Take 1 tablet (10 mEq total) by mouth daily. 90 tablet 1   rosuvastatin  (CRESTOR ) 20 MG tablet TAKE 1 TABLET BY MOUTH ON MONDAYS AND FRIDAYS 26 tablet 4   No current facility-administered medications for this visit.    Review of Systems: GENERAL: negative for malaise, night sweats HEENT: No changes in hearing or vision, no nose bleeds or other nasal problems. NECK: Negative for lumps, goiter, pain and significant neck swelling RESPIRATORY: Negative for cough,  wheezing CARDIOVASCULAR: Negative for chest pain, leg swelling, palpitations, orthopnea GI: SEE HPI MUSCULOSKELETAL: Negative for joint pain or swelling, back pain, and muscle pain. SKIN: Negative for lesions, rash HEMATOLOGY Negative for prolonged bleeding, bruising easily, and swollen nodes. ENDOCRINE: Negative for cold or heat intolerance, polyuria, polydipsia and goiter. NEURO: negative for tremor, gait imbalance, syncope and seizures. The remainder of the review of systems is noncontributory.   Physical Exam: BP 129/76 (BP Location: Left Arm, Patient Position: Sitting, Cuff Size: Normal)   Pulse 78   Temp (!) 97.1 F (36.2 C) (Temporal)   Ht 5' 6 (1.676 m)   Wt 187 lb 4.8 oz (85 kg)   BMI 30.23 kg/m  GENERAL: The patient is AO x3, in no acute distress. HEENT: Head is normocephalic and atraumatic. EOMI are intact. Mouth is well hydrated and without lesions. NECK: Supple. No masses LUNGS: Clear to auscultation. No presence of rhonchi/wheezing/rales. Adequate chest expansion HEART: RRR, normal s1 and s2. ABDOMEN: Soft, epigastric tenderness, no guarding, no peritoneal signs, and nondistended. BS +. No masses.   Imaging/Labs: as above     Latest Ref Rng & Units 03/19/2024   12:20 PM 09/28/2023   10:56 AM 02/11/2023    1:56 PM  CBC  WBC 3.4 - 10.8 x10E3/uL 8.3  7.8  7.5   Hemoglobin 11.1 - 15.9 g/dL 86.2  85.4  86.6   Hematocrit 34.0 - 46.6 % 43.5  44.6  40.1   Platelets 150 - 450 x10E3/uL 239  253  279    No results found for: IRON, TIBC, FERRITIN  I personally reviewed and interpreted the available labs, imaging and endoscopic files.  Impression and Plan:  KINZEE HAPPEL is a 83 y.o. 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  Does not take any NSAID but had exposure to steroids . This could be peptic ulcer disease  Last upper endoscopy 2021 with submucosal lesion deemed to be leiomyoma as per US  report  from 2021 by Dr. Burnette  Upper endoscopy to evaluate for peptic ulcer disease or worsening of tumor as it can improve and cause symptoms  Continue with PPI  I thoroughly discussed with the patient the procedure, including the risks involved. Patient understands what the procedure involves including the benefits and any risks. Patient understands alternatives to the proposed procedure. Risks including (but not limited to) bleeding, tearing of the lining (perforation), rupture of adjacent organs, problems with heart and lung function, infection, and medication reactions. A small percentage of complications may require surgery, hospitalization, repeat endoscopic procedure, and/or transfusion.  Patient understood and agreed.   all questions were answered.      Carsin Randazzo Faizan Saretta Dahlem, MD Gastroenterology and Hepatology [facility]   This chart has been completed using [facility] Dictation software, and while attempts have been made to ensure accuracy , certain words and phrases may not be transcribed as intended

## 2024-03-28 NOTE — Patient Instructions (Signed)
 It was very nice to meet you today, as dicussed with will plan for the following :  1) EGD

## 2024-04-08 ENCOUNTER — Ambulatory Visit (HOSPITAL_COMMUNITY)
Admission: RE | Admit: 2024-04-08 | Discharge: 2024-04-08 | Disposition: A | Discharge status: Home / Self Care | Source: Ambulatory Visit | Attending: Family Medicine | Admitting: Family Medicine

## 2024-04-08 ENCOUNTER — Ambulatory Visit (HOSPITAL_COMMUNITY)

## 2024-04-08 DIAGNOSIS — N6321 Unspecified lump in the left breast, upper outer quadrant: Secondary | ICD-10-CM | POA: Diagnosis not present

## 2024-04-08 DIAGNOSIS — N6342 Unspecified lump in left breast, subareolar: Secondary | ICD-10-CM | POA: Diagnosis not present

## 2024-04-08 DIAGNOSIS — N6002 Solitary cyst of left breast: Secondary | ICD-10-CM | POA: Insufficient documentation

## 2024-04-08 DIAGNOSIS — N63 Unspecified lump in unspecified breast: Secondary | ICD-10-CM | POA: Insufficient documentation

## 2024-04-08 DIAGNOSIS — N6323 Unspecified lump in the left breast, lower outer quadrant: Secondary | ICD-10-CM | POA: Diagnosis not present

## 2024-04-14 ENCOUNTER — Telehealth: Payer: Self-pay

## 2024-04-14 NOTE — Telephone Encounter (Signed)
 Communication  Reason for CRM: Patient calling, requesting results, and next steps of what she is to expect, of breast ultrasound and mammogram results. Imaging performed 04/08/24.            Patient can be reached at [phone] to discuss further.

## 2024-04-15 NOTE — Telephone Encounter (Signed)
 Pt notified of message. Will be in tomorrow for appt

## 2024-04-15 NOTE — Telephone Encounter (Signed)
 This appears to be benign on the testing She has a follow-up office visit with me this week We can discuss all of this in detail when she comes in Please give her a call today to let her know

## 2024-04-16 ENCOUNTER — Ambulatory Visit (INDEPENDENT_AMBULATORY_CARE_PROVIDER_SITE_OTHER): Admitting: Family Medicine

## 2024-04-16 ENCOUNTER — Encounter: Payer: Self-pay | Admitting: Family Medicine

## 2024-04-16 VITALS — BP 136/82 | HR 84 | Temp 97.2°F | Ht 66.0 in | Wt 185.0 lb

## 2024-04-16 DIAGNOSIS — N6009 Solitary cyst of unspecified breast: Secondary | ICD-10-CM | POA: Diagnosis not present

## 2024-04-16 NOTE — Progress Notes (Signed)
   Subjective:    Patient ID: Melissa Barajas, female    DOB: May 21, 1941, 83 y.o.   MRN: [id number]  HPI Mammogram results follow up  Patient mammogram Results showed probable cyst no sign of cancer Recommended doing a mammogram in December Patient has had a previous biopsy which was benign back in 2011 Patient is concerned about her findings But at the same time reassured about the recent test results   Review of Systems     Objective:   Physical Exam  Today's visit was discussion      Assessment & Plan:  Normal mammogram and ultrasound Follow-up mammogram in December Will consult with general surgery Dr. Ebbie regarding if he feels biopsy is necessary more than likely they will recommend just following  Osteoarthritis of the hands recommend Tylenol  Dr. Jeannetta stated could use low-dose prednisone  patient does not want to use low-dose prednisone  because of potential side effects I do not recommend NSAIDs because patient has history of GI bleed

## 2024-04-17 ENCOUNTER — Other Ambulatory Visit (HOSPITAL_COMMUNITY): Payer: Self-pay | Admitting: Neurosurgery

## 2024-04-17 ENCOUNTER — Telehealth: Payer: Self-pay

## 2024-04-17 ENCOUNTER — Other Ambulatory Visit: Payer: Self-pay

## 2024-04-17 ENCOUNTER — Encounter (HOSPITAL_COMMUNITY)
Admission: RE | Admit: 2024-04-17 | Discharge: 2024-04-17 | Disposition: A | Discharge status: Home / Self Care | Source: Ambulatory Visit | Attending: Gastroenterology | Admitting: Gastroenterology

## 2024-04-17 ENCOUNTER — Encounter (HOSPITAL_COMMUNITY): Payer: Self-pay

## 2024-04-17 DIAGNOSIS — R1013 Epigastric pain: Secondary | ICD-10-CM

## 2024-04-17 DIAGNOSIS — I671 Cerebral aneurysm, nonruptured: Secondary | ICD-10-CM

## 2024-04-17 MED ORDER — PANTOPRAZOLE SODIUM 40 MG PO TBEC: 40.0000 mg | DELAYED_RELEASE_TABLET | Freq: Every day | ORAL | 2 refills | Status: DC

## 2024-04-17 NOTE — Telephone Encounter (Signed)
 Prescription Request  04/17/2024  LOV: Visit date not found  What is the name of the medication or equipment? pantoprazole  (PROTONIX ) 40 MG tablet   Have you contacted your pharmacy to request a refill? Yes   Which pharmacy would you like this sent to?   [facility] [address]     Patient notified that their request is being sent to the clinical staff for review and that they should receive a response within 2 business days.   Please advise at Mobile [phone] (mobile)

## 2024-04-22 ENCOUNTER — Encounter (HOSPITAL_COMMUNITY): Payer: Self-pay | Admitting: Gastroenterology

## 2024-04-22 ENCOUNTER — Ambulatory Visit (HOSPITAL_COMMUNITY): Admitting: Anesthesiology

## 2024-04-22 ENCOUNTER — Encounter (HOSPITAL_COMMUNITY)
Admission: RE | Disposition: A | Discharge status: Home / Self Care | Payer: Self-pay | Source: Home / Self Care | Attending: Gastroenterology

## 2024-04-22 ENCOUNTER — Ambulatory Visit (HOSPITAL_COMMUNITY)
Admission: RE | Admit: 2024-04-22 | Discharge: 2024-04-22 | Disposition: A | Discharge status: Home / Self Care | Attending: Gastroenterology | Admitting: Gastroenterology

## 2024-04-22 DIAGNOSIS — K2951 Unspecified chronic gastritis with bleeding: Secondary | ICD-10-CM | POA: Diagnosis not present

## 2024-04-22 DIAGNOSIS — K259 Gastric ulcer, unspecified as acute or chronic, without hemorrhage or perforation: Secondary | ICD-10-CM | POA: Diagnosis not present

## 2024-04-22 DIAGNOSIS — I4891 Unspecified atrial fibrillation: Secondary | ICD-10-CM | POA: Diagnosis not present

## 2024-04-22 DIAGNOSIS — D219 Benign neoplasm of connective and other soft tissue, unspecified: Secondary | ICD-10-CM

## 2024-04-22 DIAGNOSIS — Z7951 Long term (current) use of inhaled steroids: Secondary | ICD-10-CM | POA: Diagnosis not present

## 2024-04-22 DIAGNOSIS — K648 Other hemorrhoids: Secondary | ICD-10-CM | POA: Insufficient documentation

## 2024-04-22 DIAGNOSIS — R1310 Dysphagia, unspecified: Secondary | ICD-10-CM | POA: Diagnosis not present

## 2024-04-22 DIAGNOSIS — I10 Essential (primary) hypertension: Secondary | ICD-10-CM | POA: Diagnosis not present

## 2024-04-22 DIAGNOSIS — E7800 Pure hypercholesterolemia, unspecified: Secondary | ICD-10-CM | POA: Diagnosis not present

## 2024-04-22 DIAGNOSIS — I890 Lymphedema, not elsewhere classified: Secondary | ICD-10-CM

## 2024-04-22 DIAGNOSIS — K921 Melena: Secondary | ICD-10-CM | POA: Insufficient documentation

## 2024-04-22 DIAGNOSIS — K449 Diaphragmatic hernia without obstruction or gangrene: Secondary | ICD-10-CM | POA: Diagnosis not present

## 2024-04-22 DIAGNOSIS — D214 Benign neoplasm of connective and other soft tissue of abdomen: Secondary | ICD-10-CM | POA: Diagnosis not present

## 2024-04-22 DIAGNOSIS — K2950 Unspecified chronic gastritis without bleeding: Secondary | ICD-10-CM | POA: Insufficient documentation

## 2024-04-22 DIAGNOSIS — K257 Chronic gastric ulcer without hemorrhage or perforation: Secondary | ICD-10-CM

## 2024-04-22 DIAGNOSIS — Z79899 Other long term (current) drug therapy: Secondary | ICD-10-CM | POA: Diagnosis not present

## 2024-04-22 DIAGNOSIS — Z87891 Personal history of nicotine dependence: Secondary | ICD-10-CM | POA: Diagnosis not present

## 2024-04-22 DIAGNOSIS — K3189 Other diseases of stomach and duodenum: Secondary | ICD-10-CM | POA: Diagnosis not present

## 2024-04-22 HISTORY — PX: ESOPHAGOGASTRODUODENOSCOPY: SHX5428

## 2024-04-22 SURGERY — EGD (ESOPHAGOGASTRODUODENOSCOPY)
Anesthesia: General

## 2024-04-22 MED ORDER — PROPOFOL 500 MG/50ML IV EMUL
INTRAVENOUS | Status: DC | PRN
Start: 2024-04-22 — End: 2024-04-22
  Administered 2024-04-22: 100 ug/kg/min via INTRAVENOUS
  Administered 2024-04-22: 60 mg via INTRAVENOUS
  Administered 2024-04-22: 100 ug/kg/min via INTRAVENOUS
  Administered 2024-04-22: 60 mg via INTRAVENOUS

## 2024-04-22 MED ORDER — LIDOCAINE 2% (20 MG/ML) 5 ML SYRINGE
INTRAMUSCULAR | Status: DC | PRN
Start: 2024-04-22 — End: 2024-04-22
  Administered 2024-04-22 (×2): 80 mg via INTRAVENOUS

## 2024-04-22 MED ORDER — LACTATED RINGERS IV SOLN: INTRAVENOUS | Status: DC

## 2024-04-22 NOTE — Discharge Instructions (Addendum)
  Discharge instructions Please read the instructions outlined below and refer to this sheet in the next few weeks. These discharge instructions provide you with general information on caring for yourself after you leave the hospital. Your doctor may also give you specific instructions. While your treatment has been planned according to the most current medical practices available, unavoidable complications occasionally occur. If you have any problems or questions after discharge, please call your doctor. ACTIVITY You may resume your regular activity but move at a slower pace for the next 24 hours.  Take frequent rest periods for the next 24 hours.  Walking will help expel (get rid of) the air and reduce the bloated feeling in your abdomen.  No driving for 24 hours (because of the anesthesia (medicine) used during the test).  You may shower.  Do not sign any important legal documents or operate any machinery for 24 hours (because of the anesthesia used during the test).  NUTRITION Drink plenty of fluids.  You may resume your normal diet.  Begin with a light meal and progress to your normal diet.  Avoid alcoholic beverages for 24 hours or as instructed by your caregiver.  MEDICATIONS You may resume your normal medications unless your caregiver tells you otherwise.  WHAT YOU CAN EXPECT TODAY You may experience abdominal discomfort such as a feeling of fullness or "gas" pains.  FOLLOW-UP Your doctor will discuss the results of your test with you.  SEEK IMMEDIATE MEDICAL ATTENTION IF ANY OF THE FOLLOWING OCCUR: Excessive nausea (feeling sick to your stomach) and/or vomiting.  Severe abdominal pain and distention (swelling).  Trouble swallowing.  Temperature over 101 F (37.8 C).  Rectal bleeding or vomiting of blood.     continue with pantoprazole  40mg , 30 min before breakfast   I hope you have a great rest of your week!   Muhammad Faizan Ahmed , M.D.. Gastroenterology and  Hepatology [facility]

## 2024-04-22 NOTE — Anesthesia Preprocedure Evaluation (Addendum)
 Anesthesia Evaluation  Patient identified by MRN, date of birth, ID band Patient awake    Reviewed: Allergy & Precautions, NPO status , Patient's Chart, lab work & pertinent test results  Airway Mallampati: II  TM Distance: >3 FB Neck ROM: Full    Dental no notable dental hx. (+) Teeth Intact, Dental Advisory Given   Pulmonary former smoker   Pulmonary exam normal breath sounds clear to auscultation       Cardiovascular hypertension, Pt. on medications Normal cardiovascular exam+ dysrhythmias Atrial Fibrillation  Rhythm:Regular Rate:Normal     Neuro/Psych negative neurological ROS  negative psych ROS   GI/Hepatic Neg liver ROS,GERD  ,,  Endo/Other  negative endocrine ROS    Renal/GU negative Renal ROS     Musculoskeletal  (+) Arthritis ,    Abdominal   Peds  Hematology negative hematology ROS (+)   Anesthesia Other Findings Collagen vascular disease  Reproductive/Obstetrics                              Anesthesia Physical Anesthesia Plan  ASA: 3  Anesthesia Plan: General   Post-op Pain Management: Minimal or no pain anticipated   Induction: Intravenous  PONV Risk Score and Plan: Propofol  infusion  Airway Management Planned: Natural Airway and Nasal Cannula  Additional Equipment: None  Intra-op Plan:   Post-operative Plan:   Informed Consent: I have reviewed the patients History and Physical, chart, labs and discussed the procedure including the risks, benefits and alternatives for the proposed anesthesia with the patient or authorized representative who has indicated his/her understanding and acceptance.     Dental advisory given  Plan Discussed with: CRNA  Anesthesia Plan Comments:         Anesthesia Quick Evaluation

## 2024-04-22 NOTE — Interval H&P Note (Signed)
 History and Physical Interval Note:  04/22/2024 9:32 AM  Melissa Barajas  has presented today for surgery, with the diagnosis of MELENA.  The various methods of treatment have been discussed with the patient and family. After consideration of risks, benefits and other options for treatment, the patient has consented to  Procedure(s) with comments: EGD (ESOPHAGOGASTRODUODENOSCOPY) (N/A) - 11:15AM;ASA 1-2 as a surgical intervention.  The patient's history has been reviewed, patient examined, no change in status, stable for surgery.  I have reviewed the patient's chart and labs.  Questions were answered to the patient's satisfaction.     Melissa Barajas Ashley Bultema

## 2024-04-22 NOTE — Progress Notes (Signed)
 Patient did not come for her BMP blood draw as requested the day before her procedure.  Dr. Landry states no need to draw it.

## 2024-04-22 NOTE — Op Note (Signed)
 [facility] Patient Name: Melissa Barajas Procedure Date: 04/22/2024 9:33 AM MRN: [id number] Date of Birth: 1940-09-14 Attending MD: Deatrice Dine , MD, [id number] CSN: [id number] Age: 83 Admit Type: Outpatient Procedure:                Upper GI endoscopy Indications:              Melena Providers:                Deatrice Dine, MD, Devere Lodge, Kristine L. Shirlean Balm, Technician Referring MD:              Medicines:                Monitored Anesthesia Care Complications:            No immediate complications. Estimated Blood Loss:     Estimated blood loss was minimal. Procedure:                Pre-Anesthesia Assessment:                           - Prior to the procedure, a History and Physical                            was performed, and patient medications and                            allergies were reviewed. The patient's tolerance of                            previous anesthesia was also reviewed. The risks                            and benefits of the procedure and the sedation                            options and risks were discussed with the patient.                            All questions were answered, and informed consent                            was obtained. Prior Anticoagulants: The patient has                            taken no anticoagulant or antiplatelet agents                            except for aspirin. ASA Grade Assessment: II - A                            patient with mild systemic disease. After reviewing  the risks and benefits, the patient was deemed in                            satisfactory condition to undergo the procedure.                           After obtaining informed consent, the endoscope was                            passed under direct vision. Throughout the                            procedure, the patient's blood pressure, pulse, and                            oxygen  saturations were monitored continuously. The                            GIF-H190 ([id number]) scope was introduced through the                            mouth, and advanced to the second part of duodenum.                            The upper GI endoscopy was accomplished without                            difficulty. The patient tolerated the procedure                            well. Scope In: 9:48:32 AM Scope Out: 9:53:36 AM Total Procedure Duration: 0 hours 5 minutes 4 seconds  Findings:      The Z-line was regular.      A 3 cm hiatal hernia with a single Cameron ulcer was found. The proximal       extent of the gastric folds (end of tubular esophagus) was at the       gastroesophageal junction.      A medium-sized, submucosal mass with no bleeding and no stigmata of       recent bleeding was found in the gastric fundus.      Inflammation characterized by erythema was found in the gastric antrum.       Biopsies were taken with a cold forceps for histology.      Lymphangiectasia was present in the duodenal bulb and in the second       portion of the duodenum. Impression:               - Z-line regular.                           - 3 cm hiatal hernia.                           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 Appears to have grown slightly in size but                            without any overt obstuction                           - Gastritis. Biopsied.                           - Duodenal mucosal lymphangiectasia.                           -Intermittent melena could be from cameron lesion                            in setting of hiatal hernia Moderate Sedation:      Per Anesthesia Care Recommendation:           - Patient has a contact number available for                            emergencies. The signs and symptoms of potential                             delayed complications were discussed with the                            patient. Return to normal activities tomorrow.                            Written discharge instructions were provided to the                            patient.                           - Resume previous diet.                           - Continue present medications.                           - Await pathology results.                           -Can consider EUS+/-FNA to be performed again to                            ensure benign nature of fundus submucosal lesion                           -Continue with PPI and Avoids NSAIDs Procedure Code(s):        --- Professional ---  56760, Esophagogastroduodenoscopy, flexible,                            transoral; with biopsy, single or multiple Diagnosis Code(s):        --- Professional ---                           K44.9, Diaphragmatic hernia without obstruction or                            gangrene                           K25.9, Gastric ulcer, unspecified as acute or                            chronic, without hemorrhage or perforation                           K29.70, Gastritis, unspecified, without bleeding                           I89.0, Lymphedema, not elsewhere classified                           K92.1, Melena (includes Hematochezia) CPT copyright 2022 American [employer] Association. All rights reserved. The codes documented in this report are preliminary and upon coder review may  be revised to meet current compliance requirements. Deatrice Dine, MD Deatrice Dine, MD 04/22/2024 10:05:44 AM This report has been signed electronically. Number of Addenda: 0

## 2024-04-22 NOTE — Transfer of Care (Signed)
 Immediate Anesthesia Transfer of Care Note  Patient: Melissa Barajas  Procedure(s) Performed: EGD (ESOPHAGOGASTRODUODENOSCOPY)  Patient Location: PACU and Short Stay  Anesthesia Type:General  Level of Consciousness: awake, alert , and oriented  Airway & Oxygen Therapy: Patient Spontanous Breathing  Post-op Assessment: Report given to RN and Post -op Vital signs reviewed and stable  Post vital signs: Reviewed and stable  Last Vitals:  Vitals Value Taken Time  BP 93/43 04/22/24 10:01  Temp 36.7 C 04/22/24 10:01  Pulse 67 04/22/24 10:01  Resp 21 04/22/24 10:01  SpO2 98 % 04/22/24 10:01    Last Pain:  Vitals:   04/22/24 1001  TempSrc: Oral  PainSc: 0-No pain         Complications: No notable events documented.

## 2024-04-22 NOTE — Anesthesia Postprocedure Evaluation (Signed)
 Anesthesia Post Note  Patient: Melissa Barajas  Procedure(s) Performed: EGD (ESOPHAGOGASTRODUODENOSCOPY)  Patient location during evaluation: Phase II Anesthesia Type: General Level of consciousness: awake and alert Pain management: pain level controlled Vital Signs Assessment: post-procedure vital signs reviewed and stable Respiratory status: spontaneous breathing, nonlabored ventilation and respiratory function stable Cardiovascular status: blood pressure returned to baseline and stable Postop Assessment: no apparent nausea or vomiting Anesthetic complications: no   There were no known notable events for this encounter.   Last Vitals:  Vitals:   04/22/24 1001 04/22/24 1009  BP: (!) 93/43 (!) 105/56  Pulse: 67   Resp: (!) 21   Temp: 36.7 C   SpO2: 98%     Last Pain:  Vitals:   04/22/24 1001  TempSrc: Oral  PainSc: 0-No pain                 Zakye Baby L Nikka Hakimian

## 2024-04-23 ENCOUNTER — Encounter (HOSPITAL_COMMUNITY): Payer: Self-pay | Admitting: Gastroenterology

## 2024-04-23 LAB — SURGICAL PATHOLOGY

## 2024-04-24 ENCOUNTER — Ambulatory Visit (INDEPENDENT_AMBULATORY_CARE_PROVIDER_SITE_OTHER): Payer: Self-pay | Admitting: Gastroenterology

## 2024-04-24 NOTE — Progress Notes (Signed)
 Patient result letter mailed Patient's PCP is on EPIC

## 2024-05-06 ENCOUNTER — Ambulatory Visit (HOSPITAL_COMMUNITY): Admission: RE | Admit: 2024-05-06 | Source: Ambulatory Visit

## 2024-05-06 ENCOUNTER — Encounter (HOSPITAL_COMMUNITY): Payer: Self-pay

## 2024-05-14 ENCOUNTER — Other Ambulatory Visit: Payer: Self-pay | Admitting: Family Medicine

## 2024-05-15 ENCOUNTER — Ambulatory Visit (HOSPITAL_COMMUNITY)
Admission: RE | Admit: 2024-05-15 | Discharge: 2024-05-15 | Disposition: A | Discharge status: Home / Self Care | Source: Ambulatory Visit | Attending: Neurosurgery | Admitting: Neurosurgery

## 2024-05-15 DIAGNOSIS — I672 Cerebral atherosclerosis: Secondary | ICD-10-CM | POA: Diagnosis not present

## 2024-05-15 DIAGNOSIS — I6523 Occlusion and stenosis of bilateral carotid arteries: Secondary | ICD-10-CM | POA: Diagnosis not present

## 2024-05-15 DIAGNOSIS — I671 Cerebral aneurysm, nonruptured: Secondary | ICD-10-CM | POA: Diagnosis not present

## 2024-05-15 MED ORDER — IOHEXOL 350 MG/ML SOLN
75.0000 mL | Freq: Once | INTRAVENOUS | Status: AC | PRN
  Administered 2024-05-15: 75 mL via INTRAVENOUS

## 2024-05-23 ENCOUNTER — Ambulatory Visit

## 2024-05-23 VITALS — Ht 66.0 in | Wt 184.0 lb

## 2024-05-23 DIAGNOSIS — Z0000 Encounter for general adult medical examination without abnormal findings: Secondary | ICD-10-CM

## 2024-05-23 NOTE — Patient Instructions (Signed)
 Melissa Barajas,  Thank you for taking the time for your Medicare Wellness Visit. I appreciate your continued commitment to your health goals. Please review the care plan we discussed, and feel free to reach out if I can assist you further.  Medicare recommends these wellness visits once per year to help you and your care team stay ahead of potential health issues. These visits are designed to focus on prevention, allowing your provider to concentrate on managing your acute and chronic conditions during your regular appointments.  Please note that Annual Wellness Visits do not include a physical exam. Some assessments may be limited, especially if the visit was conducted virtually. If needed, we may recommend a separate in-person follow-up with your provider.  Ongoing Care Seeing your primary care provider every 3 to 6 months helps us  monitor your health and provide consistent, personalized care.   Referrals If a referral was made during today's visit and you haven't received any updates within two weeks, please contact the referred provider directly to check on the status.  Recommended Screenings:  Health Maintenance  Topic Date Due   Zoster (Shingles) Vaccine (1 of 2) 05/04/1960   Flu Shot  04/11/2024   COVID-19 Vaccine (4 - 2025-26 season) 05/12/2024   DTaP/Tdap/Td vaccine (1 - Tdap) 08/16/2024*   Medicare Annual Wellness Visit  05/23/2025   Pneumococcal Vaccine for age over 2  Completed   DEXA scan (bone density measurement)  Completed   HPV Vaccine  Aged Out   Meningitis B Vaccine  Aged Out   Colon Cancer Screening  Discontinued   Hepatitis C Screening  Discontinued  *Topic was postponed. The date shown is not the original due date.       05/23/2024    1:27 PM  Advanced Directives  Does Patient Have a Medical Advance Directive? No  Would patient like information on creating a medical advance directive? Yes (MAU/Ambulatory/Procedural Areas - Information given)   Advance Care  Planning is important because it: Ensures you receive medical care that aligns with your values, goals, and preferences. Provides guidance to your family and loved ones, reducing the emotional burden of decision-making during critical moments.  Vision: Annual vision screenings are recommended for early detection of glaucoma, cataracts, and diabetic retinopathy. These exams can also reveal signs of chronic conditions such as diabetes and high blood pressure.  Dental: Annual dental screenings help detect early signs of oral cancer, gum disease, and other conditions linked to overall health, including heart disease and diabetes.  Please see the attached documents for additional preventive care recommendations.

## 2024-05-23 NOTE — Progress Notes (Signed)
 Subjective:   Melissa Barajas is a 83 y.o. who presents for a Medicare Wellness preventive visit.  As a reminder, Annual Wellness Visits don't include a physical exam, and some assessments may be limited, especially if this visit is performed virtually. We may recommend an in-person follow-up visit with your provider if needed.  Visit Complete: Virtual I connected with  Sherrilyn KATHEE Hurst on 05/23/24 by a audio enabled telemedicine application and verified that I am speaking with the correct person using two identifiers.  Patient Location: Home  Provider Location: Home Office  I discussed the limitations of evaluation and management by telemedicine. The patient expressed understanding and agreed to proceed.  Vital Signs: Because this visit was a virtual/telehealth visit, some criteria may be missing or patient reported. Any vitals not documented were not able to be obtained and vitals that have been documented are patient reported.  VideoDeclined- This patient declined Librarian, academic. Therefore the visit was completed with audio only.  Persons Participating in Visit: Patient.  AWV Questionnaire: No: Patient Medicare AWV questionnaire was not completed prior to this visit.  Cardiac Risk Factors include: advanced age (>42men, >3 women);hypertension     Objective:    Today's Vitals   05/23/24 1324  Weight: 184 lb (83.5 kg)  Height: 5' 6 (1.676 m)   Body mass index is 29.7 kg/m.     05/23/2024    1:27 PM 04/17/2024    4:25 PM 09/28/2023   10:16 AM 02/11/2023    1:31 PM 01/26/2023    9:41 AM 09/12/2022    1:33 PM 01/17/2022    9:56 AM  Advanced Directives  Does Patient Have a Medical Advance Directive? No No No No No No No  Would patient like information on creating a medical advance directive? Yes (MAU/Ambulatory/Procedural Areas - Information given) No - Patient declined Yes (MAU/Ambulatory/Procedural Areas - Information given)  Yes  (MAU/Ambulatory/Procedural Areas - Information given) No - Patient declined No - Patient declined    Current Medications (verified) Outpatient Encounter Medications as of 05/23/2024  Medication Sig   acetaminophen  (TYLENOL ) 500 MG tablet Take 1,000 mg by mouth 2 (two) times daily as needed for headache or moderate pain (pain score 4-6).   amLODipine  (NORVASC ) 10 MG tablet TAKE 1 TABLET(10MG ) BY MOUTH DAILY   BREO ELLIPTA  200-25 MCG/ACT AEPB USE 1 INHALATION BY MOUTH DAILY   cloNIDine  (CATAPRES ) 0.2 MG tablet Take 1 tablet (0.2 mg total) by mouth 2 (two) times daily.   famotidine  (PEPCID ) 40 MG tablet Take 1 tablet (40 mg total) by mouth daily.   hydrochlorothiazide  (HYDRODIURIL ) 25 MG tablet Take 1 tablet (25 mg total) by mouth daily.   OVER THE COUNTER MEDICATION Z Quil at bedtime sleep.   pantoprazole  (PROTONIX ) 40 MG tablet Take 1 tablet (40 mg total) by mouth daily.   potassium chloride  (KLOR-CON  M) 10 MEQ tablet Take 1 tablet (10 mEq total) by mouth daily.   rosuvastatin  (CRESTOR ) 20 MG tablet TAKE 1 TABLET BY MOUTH ON MONDAYS AND FRIDAYS   No facility-administered encounter medications on file as of 05/23/2024.    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 screening colonoscopy in 10 years 2. She should follow a high fiber diet She has been given a hand out on high fiber diverticulosois and hemorrhoids   COLONOSCOPY N/A 02/07/2018   Procedure: COLONOSCOPY;  Surgeon: Harvey Margo CROME, MD;  [facility];  Service: Endoscopy;  Laterality: N/A;  1:00pm   CYSTECTOMY     Left breast   ESOPHAGOGASTRODUODENOSCOPY     MFM:[id number] appearing Schatzki's ring, possible cervical esophageal web, both dilated with passage of a Maloney dilator/small hiatal hernia   ESOPHAGOGASTRODUODENOSCOPY N/A 10/25/2015   Procedure: ESOPHAGOGASTRODUODENOSCOPY (EGD);  Surgeon: Margo CROME Harvey, MD;  [facility];  Service: Endoscopy;  Laterality: N/A;  1215pm   ESOPHAGOGASTRODUODENOSCOPY N/A 10/24/2019   Schatzki's ring s/p dilation and single medium firm, probable submucosal nodule without bleeding. Medium sized hiatal hernia, normal duodenum.    ESOPHAGOGASTRODUODENOSCOPY N/A 04/05/2020   10 mm leiomyoma and capsule study without obvious lesion or bleeding.   ESOPHAGOGASTRODUODENOSCOPY N/A 04/22/2024   Procedure: EGD (ESOPHAGOGASTRODUODENOSCOPY);  Surgeon: Cinderella Deatrice FALCON, MD;  [facility];  Service: Endoscopy;  Laterality: N/A;  11:15AM;ASA 1-2   EUS  11/2019   Dr. Burnette: EUS completed March 2021 with likely leiomyoma, no other concerns. 10 mm by 10 mm.    GIVENS CAPSULE STUDY N/A 04/06/2020   Procedure: GIVENS CAPSULE STUDY;  Surgeon: Golda Claudis PENNER, MD;  [facility];  Service: Endoscopy;  Laterality: N/A;   KNEE ARTHROPLASTY Left 2007   KNEE ARTHROPLASTY Right 2009   POLYPECTOMY  02/07/2018   Procedure: POLYPECTOMY;  Surgeon: Harvey Margo CROME, MD;  [facility];   Service: Endoscopy;;  ascending colon, descending, sigmoid   SAVORY DILATION N/A 10/25/2015   Procedure: SAVORY DILATION;  Surgeon: Margo CROME Harvey, MD;  [facility];  Service: Endoscopy;  Laterality: N/A;   SAVORY DILATION N/A 10/24/2019   Procedure: SAVORY DILATION;  Surgeon: Harvey Margo CROME, MD;  [facility];  Service: Endoscopy;  Laterality: N/A;   THYROIDECTOMY     Tumor   05/2010   Left breast under nipple   VAGINAL HYSTERECTOMY     Partial   VESICOVAGINAL FISTULA CLOSURE W/ TAH     fibroid tumors   Family History  Problem Relation Age of Onset   Liver disease Father    Arthritis Sister    Pulmonary disease Sister    Sleep apnea Sister    Arthritis Brother    Liver cancer Brother    Cancer Brother        mouth cancer   Cancer Brother    Cancer Brother    Healthy Daughter    Healthy Daughter    Arthritis Son    Hypertension Son  Diabetes Son        pre-diabetes   High Cholesterol Son    Leukemia Son    Social History   Socioeconomic History   Marital status: Widowed    Spouse name: Not on file   Number of children: Not on file   Years of education: Not on file   Highest education level: Not on file  Occupational History   Not on file  Tobacco Use   Smoking status: Former    Current packs/day: 0.00    Average packs/day: 1 pack/day for 15.0 years (15.0 ttl pk-yrs)    Types: Cigarettes    Start date: 09/12/1983    Quit date: 09/11/1993    Years since quitting: 30.7    Passive exposure: Never   Smokeless tobacco: Never  Vaping Use   Vaping status: Never Used  Substance and Sexual Activity   Alcohol use: No    Alcohol/week: 0.0 standard drinks of alcohol   Drug use: No   Sexual activity: Not Currently    Birth control/protection: Post-menopausal, Surgical  Other Topics Concern   Not on file  Social History Narrative   01/26/23: Patient is currently helping to care for her brother who has cancer.    Social Drivers of Manufacturing engineer Strain: Low Risk  (01/26/2023)   Overall Financial Resource Strain (CARDIA)    Difficulty of Paying Living Expenses: Not hard at all  Food Insecurity: No Food Insecurity (05/23/2024)   Hunger Vital Sign    Worried About Running Out of Food in the Last Year: Never true    Ran Out of Food in the Last Year: Never true  Transportation Needs: No Transportation Needs (05/23/2024)   PRAPARE - Administrator, Civil Service (Medical): No    Lack of Transportation (Non-Medical): No  Physical Activity: Inactive (05/23/2024)   Exercise Vital Sign    Days of Exercise per Week: 0 days    Minutes of Exercise per Session: 0 min  Stress: No Stress Concern Present (05/23/2024)   [employer] of Occupational Health - Occupational Stress Questionnaire    Feeling of Stress: Not at all  Social Connections: Moderately Integrated (05/23/2024)   Social Connection and Isolation Panel    Frequency of Communication with Friends and Family: More than three times a week    Frequency of Social Gatherings with Friends and Family: More than three times a week    Attends Religious Services: More than 4 times per year    Active Member of [employer] or Organizations: Yes    Attends Banker Meetings: More than 4 times per year    Marital Status: Widowed    Tobacco Counseling Counseling given: Not Answered    Clinical Intake:  Pre-visit preparation completed: Yes  Pain : No/denies pain  Diabetes: No  Lab Results  Component Value Date   HGBA1C 6.9 (H) 01/07/2024   HGBA1C 5.9 (H) 04/05/2020   HGBA1C 5.7 (H) 10/20/2016     How often do you need to have someone help you when you read instructions, pamphlets, or other written materials from your doctor or pharmacy?: 1 - Never  Interpreter Needed?: No  Information entered by :: Charmaine Bloodgood LPN   Activities of Daily Living     05/23/2024    1:27 PM 04/17/2024    4:27 PM  In your present state of health, do you  have any difficulty performing the following activities:  Hearing? 0   Vision? 0  Difficulty concentrating or making decisions? 0   Walking or climbing stairs? 0   Dressing or bathing? 0   Doing errands, shopping? 0 0  Preparing Food and eating ? N   Using the Toilet? N   In the past six months, have you accidently leaked urine? N   Do you have problems with loss of bowel control? N   Managing your Medications? N   Managing your Finances? N   Housekeeping or managing your Housekeeping? N     Patient Care Team: Alphonsa Glendia LABOR, MD as PCP - General (Family Medicine) Cindie Carlin POUR, DO as Consulting Physician (Internal Medicine) Lanis Pupa, MD as Consulting Physician (Neurosurgery) Jeannetta Lonni ORN, MD as Consulting Physician (Rheumatology) Dohmeier, Dedra, MD as Consulting Physician (Neurology)  I have updated your Care Teams any recent Medical Services you may have received from other providers in the past year.     Assessment:   This is a routine wellness examination for Ages.  Hearing/Vision screen Hearing Screening - Comments:: Denies hearing difficulties   Vision Screening - Comments:: Wears rx glasses - up to date with routine eye exams with [facility].    Goals Addressed             This Visit's Progress    Patient Stated   On track    Patient states her goal is to exercise/walk more.       Prevent falls   On track      Depression Screen     05/23/2024    1:26 PM 04/16/2024   11:19 AM 03/19/2024   11:02 AM 02/19/2024   10:32 AM 10/24/2023    1:06 PM 08/17/2023   10:26 AM 06/11/2023   11:31 AM  PHQ 2/9 Scores  PHQ - 2 Score 0 0 0 0 0 0 0  PHQ- 9 Score 5 5 0 5 0 0 3    Fall Risk     05/23/2024    1:26 PM 04/16/2024   11:20 AM 03/19/2024   11:02 AM 02/19/2024   10:32 AM 10/24/2023    1:06 PM  Fall Risk   Falls in the past year? 0 0 0 0 0  Number falls in past yr: 0 0     Injury with Fall? 0 0     Risk for fall due to : No Fall Risks No Fall  Risks     Follow up Falls prevention discussed;Education provided;Falls evaluation completed Falls evaluation completed       MEDICARE RISK AT HOME:  Medicare Risk at Home Any stairs in or around the home?: No If so, are there any without handrails?: No Home free of loose throw rugs in walkways, pet beds, electrical cords, etc?: Yes Adequate lighting in your home to reduce risk of falls?: Yes Life alert?: No Use of a cane, walker or w/c?: No Grab bars in the bathroom?: Yes Shower chair or bench in shower?: No Elevated toilet seat or a handicapped toilet?: Yes  TIMED UP AND GO:  Was the test performed?  No   Cognitive Function: 6CIT completed      10/30/2017   12:00 PM  Montreal Cognitive Assessment   Visuospatial/ Executive (0/5) 3  Naming (0/3) 3  Attention: Read list of digits (0/2) 2  Attention: Read list of letters (0/1) 1  Attention: Serial 7 subtraction starting at 100 (0/3) 0  Language: Repeat phrase (0/2) 2  Language : Fluency (0/1) 1  Abstraction (0/2) 2  Delayed Recall (  0/5) 3  Orientation (0/6) 6  Total 23  Adjusted Score (based on education) 24      05/23/2024    1:27 PM 01/26/2023    9:42 AM 01/17/2022   10:00 AM 01/11/2021    9:59 AM  6CIT Screen  What Year? 0 points 0 points 0 points 0 points  What month? 0 points 0 points 0 points 0 points  What time? 0 points 0 points 0 points 0 points  Count back from 20 0 points 0 points 0 points 0 points  Months in reverse 0 points 0 points 0 points 0 points  Repeat phrase 0 points 0 points 0 points 0 points  Total Score 0 points 0 points 0 points 0 points    Immunizations Immunization History  Administered Date(s) Administered   Fluad Quad(high Dose 65+) 06/29/2021, 05/26/2022   Fluad Trivalent(High Dose 65+) 06/11/2023   INFLUENZA, HIGH DOSE SEASONAL PF 06/16/2019   Influenza Split 07/08/2013   Influenza,inj,Quad PF,6+ Mos 05/25/2014, 06/15/2015, 06/22/2016, 06/19/2017, 06/21/2018   Influenza,inj,quad,  With Preservative 06/11/2017   Influenza-Unspecified 05/13/2011, 06/25/2020   PFIZER(Purple Top)SARS-COV-2 Vaccination 10/01/2019, 10/22/2019, 06/10/2020   Pneumococcal Conjugate-13 10/12/2014   Pneumococcal Polysaccharide-23 07/01/2008   Zoster, Live 07/01/2008    Screening Tests Health Maintenance  Topic Date Due   Zoster Vaccines- Shingrix (1 of 2) 05/04/1960   Influenza Vaccine  04/11/2024   COVID-19 Vaccine (4 - 2025-26 season) 05/12/2024   DTaP/Tdap/Td (1 - Tdap) 08/16/2024 (Originally 05/04/1960)   Medicare Annual Wellness (AWV)  05/23/2025   Pneumococcal Vaccine: 50+ Years  Completed   DEXA SCAN  Completed   HPV VACCINES  Aged Out   Meningococcal B Vaccine  Aged Out   Colonoscopy  Discontinued   Hepatitis C Screening  Discontinued    Health Maintenance Items Addressed: Patient would like to hold on dexa at this time; Declines Shingrix   Additional Screening:  Vision Screening: Recommended annual ophthalmology exams for early detection of glaucoma and other disorders of the eye. Is the patient up to date with their annual eye exam?  Yes  Who is the provider or what is the name of the office in which the patient attends annual eye exams? [facility].   Dental Screening: Recommended annual dental exams for proper oral hygiene  Community Resource Referral / Chronic Care Management: CRR required this visit?  No   CCM required this visit?  No   Plan:    I have personally reviewed and noted the following in the patient's chart:   Medical and social history Use of alcohol, tobacco or illicit drugs  Current medications and supplements including opioid prescriptions. Patient is not currently taking opioid prescriptions. Functional ability and status Nutritional status Physical activity Advanced directives List of other physicians Hospitalizations, surgeries, and ER visits in previous 12 months Vitals Screenings to include cognitive, depression, and falls Referrals  and appointments  In addition, I have reviewed and discussed with patient certain preventive protocols, quality metrics, and best practice recommendations. A written personalized care plan for preventive services as well as general preventive health recommendations were provided to patient.   Lavelle Pfeiffer [place], [place]   [id number]   After Visit Summary: (MyChart) Due to this being a telephonic visit, the after visit summary with patients personalized plan was offered to patient via MyChart   Notes: Nothing significant to report at this time.

## 2024-05-26 DIAGNOSIS — I671 Cerebral aneurysm, nonruptured: Secondary | ICD-10-CM | POA: Diagnosis not present

## 2024-06-25 ENCOUNTER — Encounter (INDEPENDENT_AMBULATORY_CARE_PROVIDER_SITE_OTHER): Payer: Self-pay | Admitting: Gastroenterology

## 2024-06-30 ENCOUNTER — Encounter (INDEPENDENT_AMBULATORY_CARE_PROVIDER_SITE_OTHER): Payer: Self-pay | Admitting: Gastroenterology

## 2024-06-30 ENCOUNTER — Ambulatory Visit (INDEPENDENT_AMBULATORY_CARE_PROVIDER_SITE_OTHER): Admitting: Gastroenterology

## 2024-06-30 VITALS — BP 127/69 | HR 80 | Temp 97.2°F | Ht 66.0 in | Wt 182.7 lb

## 2024-06-30 DIAGNOSIS — K3189 Other diseases of stomach and duodenum: Secondary | ICD-10-CM | POA: Diagnosis not present

## 2024-06-30 DIAGNOSIS — R10816 Epigastric abdominal tenderness: Secondary | ICD-10-CM

## 2024-06-30 DIAGNOSIS — K219 Gastro-esophageal reflux disease without esophagitis: Secondary | ICD-10-CM | POA: Diagnosis not present

## 2024-06-30 DIAGNOSIS — K921 Melena: Secondary | ICD-10-CM | POA: Diagnosis not present

## 2024-06-30 NOTE — Progress Notes (Signed)
 Referring Provider: Alphonsa Glendia LABOR, MD Primary Care Physician:  Alphonsa Glendia LABOR, MD Primary GI Physician: Dr. Cinderella   Chief Complaint  Patient presents with   Follow-up    Pt arrives for follow up. Pt states she will have some black stools and abdominal pain every now and then (maybe about 2 times a month). Pt does eat blueberries and takes Pepto Bismol.    HPI:   Melissa Barajas is a 83 y.o. 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 Cinderella in July, at that time, having epigastric pain, recently on prednisone , black stools. Taking protonix  and pepcid   Recommended to continue PPI, schedule EGD  EGD done in August 2025 - Z-line regular.                           - 3 cm hiatal hernia.                           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                            This was deemed to be Leiomyoma from EUS previously                            . Appears to have grown slightly in size but                            without any overt obstuction                           - Gastritis. Biopsied.                           - Duodenal mucosal lymphangiectasia.                           -Intermittent melena could be from cameron lesion                            in setting of hiatal hernia GASTRIC BIOPSY:  Reactive gastropathy with minimal chronic gastritis  Negative for H. pylori, intestinal metaplasia, dysplasia and carcinoma  Recommended for pt to consider updating EUS given submucosal lesion with change in size  Present:  Last hgb in July was 13.7   She states still having some black stools about 1-2 times per month. Is taking pepto bismol and eating blueberries on occasion, she has noticed the dark stools have followed consumption of both of these in the past though last week  she had some and had not consumed either of these. She denies any  epigastric pain. She does report some severe pain in her lower stomach last Sunday, after a fall the night before. She used some icy hot and states pain was almost resolved about 2 days later, she denies any rectal bleeding or melena at that time. No further abdominal pain  She is no  longer taking protonix  but is taking famotidine  40mg  daily as she states she was told to stop PPI. She does report some heartburn sometimes at night which is when she takes her pepto bismol if needed.     03/2020 Capsule : Scant stool present in distal small bowel, colon with copious amount of greenish stool. No evidence of lesions or active bleeding, no melena.    EUS 11/2019:    Last Colonoscopy:2019 - The examined portion of the ileum was normal. - Two 2 to 3 mm polyps in the sigmoid colon and in the descending colon, removed with a cold biopsy forceps. Resected and retrieved. - One 6 mm polyp in the ascending colon, removed with a cold snare. Resected and retrieved. - Diverticulosis in the recto- sigmoid colon and in the sigmoid colon. - RECTAL BLEEDING DUE TO Internal hemorrhoids. - External hemorrhoids.   No further repeat suggested Filed Weights   06/30/24 1135  Weight: 182 lb 11.2 oz (82.9 kg)     Past Medical History:  Diagnosis Date   Arthritis    Atrial fibrillation (HCC)    Remote prior history   Collagen vascular disease    DIVERTICULOSIS OF COLON 08/25/2010   Qualifier: Diagnosis of  By: Mathew Osier     GERD (gastroesophageal reflux disease)    HEMORRHOIDS, INTERNAL 08/25/2010   Qualifier: Diagnosis of  By: Mathew Osier     Hypercholesteremia    Hypertension    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/03/2020   EGD and EGD US  [facility] 01/2020   Lipoma of arm    Left    Past Surgical History:  Procedure Laterality Date   ARTERY BIOPSY Right 10/03/2023   Procedure: BIOPSY TEMPORAL ARTERY;  Surgeon:  Okey Burns, MD;  Location: [facility];  Service: ENT;  Laterality: Right;   BIOPSY  10/24/2019   Procedure: BIOPSY;  Surgeon: Harvey Margo CROME, MD;  Location: [facility];  Service: Endoscopy;;   BREAST BIOPSY Left    COLONOSCOPY  2009   SLF: 1. screening colonoscopy in 10 years 2. She should follow a high fiber diet She has been given a hand out on high fiber diverticulosois and hemorrhoids   COLONOSCOPY N/A 02/07/2018   Procedure: COLONOSCOPY;  Surgeon: Harvey Margo CROME, MD;  [facility]: [facility];  Service: Endoscopy;  Laterality: N/A;  1:00pm   CYSTECTOMY     Left breast   ESOPHAGOGASTRODUODENOSCOPY     MFM:[id number] appearing Schatzki's ring, possible cervical esophageal web, both dilated with passage of a Maloney dilator/small hiatal hernia   ESOPHAGOGASTRODUODENOSCOPY N/A 10/25/2015   Procedure: ESOPHAGOGASTRODUODENOSCOPY (EGD);  Surgeon: Margo CROME Harvey, MD;  [facility];  Service: Endoscopy;  Laterality: N/A;  1215pm   ESOPHAGOGASTRODUODENOSCOPY N/A 10/24/2019   Schatzki's ring s/p dilation and single medium firm, probable submucosal nodule without bleeding. Medium sized hiatal hernia, normal duodenum.    ESOPHAGOGASTRODUODENOSCOPY N/A 04/05/2020   10 mm leiomyoma and capsule study without obvious lesion or bleeding.   ESOPHAGOGASTRODUODENOSCOPY N/A 04/22/2024   Procedure: EGD (ESOPHAGOGASTRODUODENOSCOPY);  Surgeon: Cinderella Deatrice FALCON, MD;  [facility];  Service: Endoscopy;  Laterality: N/A;  11:15AM;ASA 1-2   EUS  11/2019   Dr. Burnette: EUS completed March 2021 with likely leiomyoma, no other concerns. 10 mm by 10 mm.    GIVENS CAPSULE STUDY N/A 04/06/2020   Procedure: GIVENS CAPSULE STUDY;  Surgeon: Golda Claudis PENNER, MD;  [facility];  Service: Endoscopy;  Laterality: N/A;  KNEE ARTHROPLASTY Left 2007   KNEE ARTHROPLASTY Right 2009   POLYPECTOMY  02/07/2018   Procedure: POLYPECTOMY;  Surgeon: Harvey Margo CROME, MD;  [facility];   Service: Endoscopy;;  ascending colon, descending, sigmoid   SAVORY DILATION N/A 10/25/2015   Procedure: SAVORY DILATION;  Surgeon: Margo CROME Harvey, MD;  [facility];  Service: Endoscopy;  Laterality: N/A;   SAVORY DILATION N/A 10/24/2019   Procedure: SAVORY DILATION;  Surgeon: Harvey Margo CROME, MD;  [facility];  Service: Endoscopy;  Laterality: N/A;   THYROIDECTOMY     Tumor   05/2010   Left breast under nipple   VAGINAL HYSTERECTOMY     Partial   VESICOVAGINAL FISTULA CLOSURE W/ TAH     fibroid tumors    Current Outpatient Medications  Medication Sig Dispense Refill   acetaminophen  (TYLENOL ) 500 MG tablet Take 1,000 mg by mouth 2 (two) times daily as needed for headache or moderate pain (pain score 4-6).     amLODipine  (NORVASC ) 10 MG tablet TAKE 1 TABLET(10MG ) BY MOUTH DAILY 90 tablet 1   BREO ELLIPTA  200-25 MCG/ACT AEPB USE 1 INHALATION BY MOUTH DAILY 180 each 0   cloNIDine  (CATAPRES ) 0.2 MG tablet Take 1 tablet (0.2 mg total) by mouth 2 (two) times daily. 180 tablet 2   famotidine  (PEPCID ) 40 MG tablet Take 1 tablet (40 mg total) by mouth daily. 90 tablet 3   hydrochlorothiazide  (HYDRODIURIL ) 25 MG tablet Take 1 tablet (25 mg total) by mouth daily. 30 tablet 4   OVER THE COUNTER MEDICATION Z Quil at bedtime sleep.     potassium chloride  (KLOR-CON  M) 10 MEQ tablet Take 1 tablet (10 mEq total) by mouth daily. 90 tablet 1   rosuvastatin  (CRESTOR ) 20 MG tablet TAKE 1 TABLET BY MOUTH ON MONDAYS AND FRIDAYS 26 tablet 4   pantoprazole  (PROTONIX ) 40 MG tablet Take 1 tablet (40 mg total) by mouth daily. (Patient not taking: Reported on 06/30/2024) 90 tablet 2   No current facility-administered medications for this visit.    Allergies as of 06/30/2024 - Review Complete 06/30/2024  Allergen Reaction Noted   Aricept  [donepezil  hcl]  11/20/2017   Hydrocodone Nausea Only 01/08/2013   Lisinopril Other (See Comments) 01/08/2013   Naprosyn [naproxen] Nausea Only  04/06/2016   Oxycodone   05/14/2015   Rofecoxib  01/08/2013    Social History   Socioeconomic History   Marital status: Widowed    Spouse name: Not on file   Number of children: Not on file   Years of education: Not on file   Highest education level: Not on file  Occupational History   Not on file  Tobacco Use   Smoking status: Former    Current packs/day: 0.00    Average packs/day: 1 pack/day for 15.0 years (15.0 ttl pk-yrs)    Types: Cigarettes    Start date: 09/12/1983    Quit date: 09/11/1993    Years since quitting: 30.8    Passive exposure: Never   Smokeless tobacco: Never  Vaping Use   Vaping status: Never Used  Substance and Sexual Activity   Alcohol use: No    Alcohol/week: 0.0 standard drinks of alcohol   Drug use: No   Sexual activity: Not Currently    Birth control/protection: Post-menopausal, Surgical  Other Topics Concern   Not on file  Social History Narrative   01/26/23: Patient is currently helping to care for her brother who has cancer.    Social Drivers  of Health   Financial Resource Strain: Low Risk  (01/26/2023)   Overall Financial Resource Strain (CARDIA)    Difficulty of Paying Living Expenses: Not hard at all  Food Insecurity: No Food Insecurity (05/23/2024)   Hunger Vital Sign    Worried About Running Out of Food in the Last Year: Never true    Ran Out of Food in the Last Year: Never true  Transportation Needs: No Transportation Needs (05/23/2024)   PRAPARE - Administrator, Civil Service (Medical): No    Lack of Transportation (Non-Medical): No  Physical Activity: Inactive (05/23/2024)   Exercise Vital Sign    Days of Exercise per Week: 0 days    Minutes of Exercise per Session: 0 min  Stress: No Stress Concern Present (05/23/2024)   [employer] of Occupational Health - Occupational Stress Questionnaire    Feeling of Stress: Not at all  Social Connections: Moderately Integrated (05/23/2024)   Social Connection and Isolation  Panel    Frequency of Communication with Friends and Family: More than three times a week    Frequency of Social Gatherings with Friends and Family: More than three times a week    Attends Religious Services: More than 4 times per year    Active Member of [employer] or Organizations: Yes    Attends Banker Meetings: More than 4 times per year    Marital Status: Widowed    Review of systems General: negative for malaise, night sweats, fever, chills, weight loss Neck: Negative for lumps, goiter, pain and significant neck swelling Resp: Negative for cough, wheezing, dyspnea at rest CV: Negative for chest pain, leg swelling, palpitations, orthopnea GI: denies  hematochezia, nausea, vomiting, diarrhea, constipation, dysphagia, odyonophagia, early satiety or unintentional weight loss. +black stools +heartburn MSK: Negative for joint pain or swelling, back pain, and muscle pain. Derm: Negative for itching or rash Psych: Denies depression, anxiety, memory loss, confusion. No homicidal or suicidal ideation.  Heme: Negative for prolonged bleeding, bruising easily, and swollen nodes. Endocrine: Negative for cold or heat intolerance, polyuria, polydipsia and goiter. Neuro: negative for tremor, gait imbalance, syncope and seizures. The remainder of the review of systems is noncontributory.  Physical Exam: BP (!) 152/72   Pulse 80   Temp (!) 97.2 F (36.2 C)   Ht 5' 6 (1.676 m)   Wt 182 lb 11.2 oz (82.9 kg)   BMI 29.49 kg/m  General:   Alert and oriented. No distress noted. Pleasant and cooperative.  Head:  Normocephalic and atraumatic. Eyes:  Conjuctiva clear without scleral icterus. Mouth:  Oral mucosa pink and moist. Good dentition. No lesions. Heart: Normal rate and rhythm, s1 and s2 heart sounds present.  Lungs: Clear lung sounds in all lobes. Respirations equal and unlabored. Abdomen:  +BS, soft, non-tender and non-distended. No rebound or guarding. No HSM or masses  noted. Derm: No palmar erythema or jaundice Msk:  Symmetrical without gross deformities. Normal posture. Extremities:  Without edema. Neurologic:  Alert and  oriented x4 Psych:  Alert and cooperative. Normal mood and affect.  Invalid input(s): 6 MONTHS   ASSESSMENT: STAISHA WINIARSKI is a 83 y.o. female presenting today for follow up of epigastric pain, black stools, GERD and submucosal gastric lesion  GERD: epigastric pain improved. Currently only on H2B in the mornings, states she was previously told to stop PPI. Having some heartburn occasionally at night. I recommended she try taking famotidine  in the evenings, adhere to good reflux precautions. May need to  add PPI back in if GERD not well controlled on H2B.  Black stools: having intermittent black stools for a while. She feels these are less than prior to EGD which had findings of duodenal lymphangiectasia, gastritis and suspected possible cameron lesions r/t larger hiatal hernia, which may be contributing to her melena at that time. As above, previous capsule study in 2021 that was grossly unremarkable. Her blood counts have remained stable. Unclear at this time if this is true melena or not as she is also taking pepto bismol intermittently. Recommend checking fecal occult stool cards x3.  Submucosal gastric lesion: EUS in 2021, lesion deemed leiomyoma. Lesion noted again during recent EGD and appeared to have grown slightly, patient recommended at that time to consider having repeat EUS with possible FNA to ensure no malignant changes. I discussed this with the patient today as she states she did not understand the recommendation after her EGD. She is agreeable to proceed with EUS, prefers to have this done with Dr. Burnette if possible, as he did her last. We will try to refer her over to Dr. Dolan office for EUS +/- FNA, will refer to [facility] if Dr. Burnette Is not able to accommodate her.    PLAN:  -occult stool cards x3 -EUS +/-FNA-- refer  to Dr. Veldon if Dr. Burnette cannot accommodate  -start taking famotidine  40mg  in the evenings, resume PPI if GERD not improving  -good reflux precautions   All questions were answered, patient verbalized understanding and is in agreement with plan as outlined above.    Follow Up: 3 months   Jola Critzer L. Ajanee Buren, MSN, APRN, AGNP-C Adult-Gerontology Nurse Practitioner [facility]

## 2024-06-30 NOTE — Patient Instructions (Signed)
-  occult stool cards x3 -refer to Dr. Burnette for EUS of lesion in stomach -start taking famotidine  40mg  in the evenings, let me know if heartburn does not improve  -good reflux precautions to include avoiding eating late, stay upright 2-3 hours after eating, be mindful of greasy, spicy, citrus based/tomato based foods, caffeine and chocolate  Follow up 3 months   It was a pleasure to see you today. I want to create trusting relationships with patients and provide genuine, compassionate, and quality care. I truly value your feedback! please be on the lookout for a survey regarding your visit with me today. I appreciate your input about our visit and your time in completing this!    Magdiel Bartles L. Chalon Zobrist, MSN, APRN, AGNP-C Adult-Gerontology Nurse Practitioner [facility]

## 2024-07-04 ENCOUNTER — Telehealth (INDEPENDENT_AMBULATORY_CARE_PROVIDER_SITE_OTHER): Payer: Self-pay | Admitting: *Deleted

## 2024-07-04 NOTE — Telephone Encounter (Signed)
 Spoke to [employer] at Dr Dolan office - she will have to get his approval to see patient. Apparently she was established with him and last seen in 2023, patient called them back in August wanting to go back to him and and was told he would see her back but if she established with another GI she would be dismissed from the practice - Chander will let me know what his decision is

## 2024-07-07 NOTE — Telephone Encounter (Signed)
Message sent to [facility]

## 2024-07-07 NOTE — Telephone Encounter (Signed)
 Spoke to [employer] this morning - Dr Burnette denied referral for EUS as patient as transferred GI care to [facility] - who do you want me to refer her to for EUS?

## 2024-07-08 ENCOUNTER — Telehealth: Payer: Self-pay

## 2024-07-08 NOTE — Telephone Encounter (Signed)
-----   Message from [facility] sent at 07/07/2024  4:47 PM EDT ----- [name] may schedule next available upper EUS. She can have this done in December or January. Thanks. GM ----- Message ----- From: Anitra Barajas CROME, RN Sent: 07/07/2024  12:25 PM EDT To: Aloha Wilhelmenia Raddle., MD   ----- Message ----- From: Blanca Jenkins DEL Sent: 07/07/2024  11:20 AM EDT To: Barajas CROME Anitra, RN; Mitzie CROME Boettcher, NP  Melissa Barajas - per [facility] patient needs EUS +/- FNA Dx: Submucosal gastric lesion - non urgent  FYI: We did send a referral to Dr Burnette since he did her last one however he denied the referral as she had transferred her GI care to us  which she did due to her age and we are closer to her.

## 2024-07-09 ENCOUNTER — Other Ambulatory Visit: Payer: Self-pay

## 2024-07-09 DIAGNOSIS — D214 Benign neoplasm of connective and other soft tissue of abdomen: Secondary | ICD-10-CM

## 2024-07-09 DIAGNOSIS — K319 Disease of stomach and duodenum, unspecified: Secondary | ICD-10-CM

## 2024-07-09 NOTE — Telephone Encounter (Signed)
 EUS has been entered for 08/25/24 at [facility] with GM at 2 pm  Left message on machine to call back

## 2024-07-09 NOTE — Telephone Encounter (Signed)
 EUS scheduled, pt instructed and medications reviewed.  Patient instructions mailed to home.  Patient to call with any questions or concerns.

## 2024-07-25 DIAGNOSIS — K3189 Other diseases of stomach and duodenum: Secondary | ICD-10-CM | POA: Diagnosis not present

## 2024-07-30 ENCOUNTER — Other Ambulatory Visit: Payer: Self-pay | Admitting: Family Medicine

## 2024-08-01 ENCOUNTER — Other Ambulatory Visit: Payer: Self-pay | Admitting: Family Medicine

## 2024-08-19 ENCOUNTER — Encounter (HOSPITAL_COMMUNITY): Payer: Self-pay | Admitting: Gastroenterology

## 2024-08-19 NOTE — Progress Notes (Signed)
 Attempted to obtain medical history for pre op call via telephone, unable to reach at this time. HIPAA compliant voicemail message left requesting return call to pre surgical testing department.

## 2024-08-25 ENCOUNTER — Encounter (HOSPITAL_COMMUNITY): Payer: Self-pay | Admitting: Gastroenterology

## 2024-08-25 ENCOUNTER — Encounter (HOSPITAL_COMMUNITY): Admission: RE | Disposition: A | Payer: Self-pay | Source: Home / Self Care | Attending: Gastroenterology

## 2024-08-25 ENCOUNTER — Ambulatory Visit (HOSPITAL_COMMUNITY): Admitting: Anesthesiology

## 2024-08-25 ENCOUNTER — Ambulatory Visit (HOSPITAL_COMMUNITY)
Admission: RE | Admit: 2024-08-25 | Discharge: 2024-08-25 | Disposition: A | Attending: Gastroenterology | Admitting: Gastroenterology

## 2024-08-25 ENCOUNTER — Other Ambulatory Visit: Payer: Self-pay

## 2024-08-25 DIAGNOSIS — K2289 Other specified disease of esophagus: Secondary | ICD-10-CM | POA: Diagnosis not present

## 2024-08-25 DIAGNOSIS — K449 Diaphragmatic hernia without obstruction or gangrene: Secondary | ICD-10-CM

## 2024-08-25 DIAGNOSIS — C49A2 Gastrointestinal stromal tumor of stomach: Secondary | ICD-10-CM | POA: Diagnosis not present

## 2024-08-25 DIAGNOSIS — K219 Gastro-esophageal reflux disease without esophagitis: Secondary | ICD-10-CM | POA: Insufficient documentation

## 2024-08-25 DIAGNOSIS — K319 Disease of stomach and duodenum, unspecified: Secondary | ICD-10-CM

## 2024-08-25 DIAGNOSIS — K3189 Other diseases of stomach and duodenum: Secondary | ICD-10-CM

## 2024-08-25 DIAGNOSIS — M1990 Unspecified osteoarthritis, unspecified site: Secondary | ICD-10-CM | POA: Insufficient documentation

## 2024-08-25 DIAGNOSIS — I899 Noninfective disorder of lymphatic vessels and lymph nodes, unspecified: Secondary | ICD-10-CM | POA: Diagnosis not present

## 2024-08-25 DIAGNOSIS — Z87891 Personal history of nicotine dependence: Secondary | ICD-10-CM | POA: Diagnosis not present

## 2024-08-25 DIAGNOSIS — D214 Benign neoplasm of connective and other soft tissue of abdomen: Secondary | ICD-10-CM

## 2024-08-25 DIAGNOSIS — I4891 Unspecified atrial fibrillation: Secondary | ICD-10-CM | POA: Insufficient documentation

## 2024-08-25 DIAGNOSIS — I10 Essential (primary) hypertension: Secondary | ICD-10-CM | POA: Insufficient documentation

## 2024-08-25 HISTORY — PX: FINE NEEDLE ASPIRATION BIOPSY: CATH118315

## 2024-08-25 HISTORY — PX: ESOPHAGOGASTRODUODENOSCOPY: SHX5428

## 2024-08-25 HISTORY — PX: EUS: SHX5427

## 2024-08-25 SURGERY — ULTRASOUND, UPPER GI TRACT, ENDOSCOPIC
Anesthesia: Monitor Anesthesia Care

## 2024-08-25 MED ORDER — PROPOFOL 10 MG/ML IV BOLUS
INTRAVENOUS | Status: AC
  Filled 2024-08-25: qty 20

## 2024-08-25 MED ORDER — PROPOFOL 500 MG/50ML IV EMUL
INTRAVENOUS | Status: AC
  Filled 2024-08-25: qty 50

## 2024-08-25 MED ORDER — PROPOFOL 500 MG/50ML IV EMUL
INTRAVENOUS | Status: DC | PRN
  Administered 2024-08-25: 15:00:00 50 mg via INTRAVENOUS
  Administered 2024-08-25: 15:00:00 60 mg via INTRAVENOUS
  Administered 2024-08-25: 15:00:00 125 ug/kg/min via INTRAVENOUS

## 2024-08-25 MED ORDER — IPRATROPIUM-ALBUTEROL 0.5-2.5 (3) MG/3ML IN SOLN
3.0000 mL | Freq: Once | RESPIRATORY_TRACT | Status: AC
  Administered 2024-08-25: 16:00:00 3 mL via RESPIRATORY_TRACT

## 2024-08-25 MED ORDER — SODIUM CHLORIDE 0.9 % IV SOLN
INTRAVENOUS | Status: DC
  Administered 2024-08-25: 13:00:00 500 mL via INTRAVENOUS

## 2024-08-25 MED ORDER — IPRATROPIUM-ALBUTEROL 0.5-2.5 (3) MG/3ML IN SOLN
RESPIRATORY_TRACT | Status: AC
  Filled 2024-08-25: qty 3

## 2024-08-25 NOTE — Discharge Instructions (Signed)

## 2024-08-25 NOTE — H&P (Signed)
 GASTROENTEROLOGY PROCEDURE H&P NOTE   Primary Care Physician: Alphonsa Glendia LABOR, MD  HPI: Melissa Barajas is a 83 y.o. female who presents for EGD/EUS for evaluation of a gastric lesion subepithelial lesion.  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 screening colonoscopy in 10 years 2. She should follow a high fiber diet She has been given a hand out on high fiber diverticulosois and hemorrhoids   COLONOSCOPY N/A 02/07/2018   Procedure: COLONOSCOPY;  Surgeon: Harvey Margo CROME, MD;  [facility]: [facility];  Service: Endoscopy;  Laterality: N/A;  1:00pm   CYSTECTOMY     Left breast   ESOPHAGOGASTRODUODENOSCOPY     MFM:Wnwrmpuprjo appearing Schatzki's ring, possible cervical esophageal web, both dilated with passage of a Maloney dilator/small hiatal hernia   ESOPHAGOGASTRODUODENOSCOPY N/A 10/25/2015   Procedure: ESOPHAGOGASTRODUODENOSCOPY (EGD);  Surgeon: Margo CROME Harvey, MD;  [facility];  Service: Endoscopy;  Laterality: N/A;  1215pm   ESOPHAGOGASTRODUODENOSCOPY N/A 10/24/2019   Schatzki's ring s/p dilation and single medium firm, probable  submucosal nodule without bleeding. Medium sized hiatal hernia, normal duodenum.    ESOPHAGOGASTRODUODENOSCOPY N/A 04/05/2020   10 mm leiomyoma and capsule study without obvious lesion or bleeding.   ESOPHAGOGASTRODUODENOSCOPY N/A 04/22/2024   Procedure: EGD (ESOPHAGOGASTRODUODENOSCOPY);  Surgeon: Cinderella Deatrice FALCON, MD;  [facility];  Service: Endoscopy;  Laterality: N/A;  11:15AM;ASA 1-2   EUS  11/2019   Dr. Burnette: EUS completed March 2021 with likely leiomyoma, no other concerns. 10 mm by 10 mm.    GIVENS CAPSULE STUDY N/A 04/06/2020   Procedure: GIVENS CAPSULE STUDY;  Surgeon: Golda Claudis PENNER, MD;  [facility];  Service: Endoscopy;  Laterality: N/A;   KNEE ARTHROPLASTY Left 2007   KNEE ARTHROPLASTY Right 2009   POLYPECTOMY  02/07/2018   Procedure: POLYPECTOMY;  Surgeon: Harvey Margo CROME, MD;  [facility];  Service: Endoscopy;;  ascending colon, descending, sigmoid   SAVORY DILATION N/A 10/25/2015   Procedure: SAVORY DILATION;  Surgeon: Margo CROME Harvey, MD;  [facility];  Service: Endoscopy;  Laterality: N/A;   SAVORY DILATION N/A 10/24/2019   Procedure: SAVORY DILATION;  Surgeon: Harvey Margo CROME, MD;  [facility];  Service: Endoscopy;  Laterality: N/A;   THYROIDECTOMY     Tumor   05/2010   Left breast under nipple   VAGINAL HYSTERECTOMY     Partial   VESICOVAGINAL FISTULA CLOSURE W/ TAH     fibroid tumors   No current  facility-administered medications for this encounter.   Current Medications[1] Allergies[2] Family History  Problem Relation Age of Onset   Liver disease Father    Arthritis Sister    Pulmonary disease Sister    Sleep apnea Sister    Arthritis Brother    Liver cancer Brother    Cancer Brother        mouth cancer   Cancer Brother    Cancer Brother    Healthy Daughter    Healthy Daughter    Arthritis Son    Hypertension Son    Diabetes Son        pre-diabetes   High Cholesterol Son    Leukemia  Son    Social History   Socioeconomic History   Marital status: Widowed    Spouse name: Not on file   Number of children: Not on file   Years of education: Not on file   Highest education level: Not on file  Occupational History   Not on file  Tobacco Use   Smoking status: Former    Current packs/day: 0.00    Average packs/day: 1 pack/day for 15.0 years (15.0 ttl pk-yrs)    Types: Cigarettes    Start date: 09/12/1983    Quit date: 09/11/1993    Years since quitting: 30.9    Passive exposure: Never   Smokeless tobacco: Never  Vaping Use   Vaping status: Never Used  Substance and Sexual Activity   Alcohol use: No    Alcohol/week: 0.0 standard drinks of alcohol   Drug use: No   Sexual activity: Not Currently    Birth control/protection: Post-menopausal, Surgical  Other Topics Concern   Not on file  Social History Narrative   01/26/23: Patient is currently helping to care for her brother who has cancer.    Social Drivers of Health   Tobacco Use: Medium Risk (08/25/2024)   Patient History    Smoking Tobacco Use: Former    Smokeless Tobacco Use: Never    Passive Exposure: Never  Physicist, Medical Strain: Low Risk (01/26/2023)   Overall Financial Resource Strain (CARDIA)    Difficulty of Paying Living Expenses: Not hard at all  Food Insecurity: No Food Insecurity (05/23/2024)   Epic    Worried About Programme Researcher, Broadcasting/film/video in the Last Year: Never true    Ran Out of Food in the Last Year: Never true  Transportation Needs: No Transportation Needs (05/23/2024)   Epic    Lack of Transportation (Medical): No    Lack of Transportation (Non-Medical): No  Physical Activity: Inactive (05/23/2024)   Exercise Vital Sign    Days of Exercise per Week: 0 days    Minutes of Exercise per Session: 0 min  Stress: No Stress Concern Present (05/23/2024)   [employer] of Occupational Health - Occupational Stress Questionnaire    Feeling of Stress: Not at all  Social Connections: Moderately  Integrated (05/23/2024)   Social Connection and Isolation Panel    Frequency of Communication with Friends and Family: More than three times a week    Frequency of Social Gatherings with Friends and Family: More than three times a week    Attends Religious Services: More than 4 times per year    Active Member of [employer] or Organizations: Yes    Attends Banker Meetings: More than 4 times per year    Marital Status: Widowed  Intimate Partner Violence: Not At Risk (05/23/2024)   Epic    Fear of Current or  Ex-Partner: No    Emotionally Abused: No    Physically Abused: No    Sexually Abused: No  Depression (PHQ2-9): Medium Risk (05/23/2024)   Depression (PHQ2-9)    PHQ-2 Score: 5  Alcohol Screen: Low Risk (05/23/2024)   Alcohol Screen    Last Alcohol Screening Score (AUDIT): 0  Housing: Unknown (05/23/2024)   Epic    Unable to Pay for Housing in the Last Year: No    Number of Times Moved in the Last Year: Not on file    Homeless in the Last Year: No  Utilities: Not At Risk (05/23/2024)   Epic    Threatened with loss of utilities: No  Health Literacy: Adequate Health Literacy (05/23/2024)   B1300 Health Literacy    Frequency of need for help with medical instructions: Never    Physical Exam: Today's Vitals   08/19/24 1301  Weight: 82 kg   Body mass index is 29.18 kg/m. GEN: NAD EYE: Sclerae anicteric ENT: MMM CV: Non-tachycardic GI: Soft, NT/ND NEURO:  Alert & Oriented x 3  Lab Results: No results for input(s): WBC, HGB, HCT, PLT in the last 72 hours. BMET No results for input(s): NA, K, CL, CO2, GLUCOSE, BUN, CREATININE, CALCIUM  in the last 72 hours. LFT No results for input(s): PROT, ALBUMIN, AST, ALT, ALKPHOS, BILITOT, BILIDIR, IBILI in the last 72 hours. PT/INR No results for input(s): LABPROT, INR in the last 72 hours.   Impression / Plan: This is a 83 y.o.female who presents for EGD/EUS for evaluation of a  gastric lesion subepithelial lesion.  The risks of an EUS including intestinal perforation, bleeding, infection, aspiration, and medication effects were discussed as was the possibility it may not give a definitive diagnosis if a biopsy is performed.  When a biopsy of the pancreas is done as part of the EUS, there is an additional risk of pancreatitis at the rate of about 1-2%.  It was explained that procedure related pancreatitis is typically mild, although it can be severe and even life threatening, which is why we do not perform random pancreatic biopsies and only biopsy a lesion/area we feel is concerning enough to warrant the risk.  The risks and benefits of endoscopic evaluation/treatment were discussed with the patient and/or family; these include but are not limited to the risk of perforation, infection, bleeding, missed lesions, lack of diagnosis, severe illness requiring hospitalization, as well as anesthesia and sedation related illnesses.  The patient's history has been reviewed, patient examined, no change in status, and deemed stable for procedure.  The patient and/or family was provided an opportunity to ask questions and all were answered.  The patient and/or family is agreeable to proceed.    Aloha Finner, MD [facility] Office # [phone]     [1] No current facility-administered medications for this encounter. [2]  Allergies Allergen Reactions   Aricept  [Donepezil  Hcl]     Sleep deprived   Hydrocodone Nausea Only   Lisinopril Other (See Comments)    Angio edema   Naprosyn [Naproxen] Nausea Only   Oxycodone      GI Intolerance   Rofecoxib     Unknown reaction

## 2024-08-25 NOTE — Anesthesia Postprocedure Evaluation (Signed)
 Anesthesia Post Note  Patient: Melissa Barajas  Procedure(s) Performed: ULTRASOUND, UPPER GI TRACT, ENDOSCOPIC EGD (ESOPHAGOGASTRODUODENOSCOPY) FINE NEEDLE ASPIRATION BIOPSY     Patient location during evaluation: Endoscopy Anesthesia Type: MAC Level of consciousness: oriented, awake and alert and awake Pain management: pain level controlled Vital Signs Assessment: post-procedure vital signs reviewed and stable Respiratory status: spontaneous breathing, nonlabored ventilation, respiratory function stable and patient connected to nasal cannula oxygen Cardiovascular status: blood pressure returned to baseline and stable Postop Assessment: no headache, no backache and no apparent nausea or vomiting Anesthetic complications: no   No notable events documented.  Last Vitals:  Vitals:   08/25/24 1630 08/25/24 1635  BP: (!) 141/62   Pulse: 66 68  Resp: (!) 23 18  Temp:    SpO2: 93% 92%    Last Pain:  Vitals:   08/25/24 1635  TempSrc:   PainSc: 0-No pain                 Garnette FORBES Skillern

## 2024-08-25 NOTE — Op Note (Signed)
 [facility] Patient Name: Melissa Barajas Procedure Date: 08/25/2024 MRN: [id number] Attending MD: Aloha Finner , MD, [id number] Date of Birth: Jun 05, 1941 CSN: [id number] Age: 83 Admit Type: Outpatient Procedure:                Upper EUS Indications:              Gastric deformity on endoscopy/Subepithelial tumor                            versus extrinsic compression Providers:                Aloha Finner, MD, Ozell Pouch, Lorrayne Kitty, Technician Referring MD:              Medicines:                Monitored Anesthesia Care Complications:            No immediate complications. Estimated Blood Loss:     Estimated blood loss was minimal. Procedure:                Pre-Anesthesia Assessment:                           - Prior to the procedure, a History and Physical                            was performed, and patient medications and                            allergies were reviewed. The patient's tolerance of                            previous anesthesia was also reviewed. The risks                            and benefits of the procedure and the sedation                            options and risks were discussed with the patient.                            All questions were answered, and informed consent                            was obtained. Prior Anticoagulants: The patient has                            taken no anticoagulant or antiplatelet agents. ASA                            Grade Assessment: III - A patient with severe                            systemic disease.  After reviewing the risks and                            benefits, the patient was deemed in satisfactory                            condition to undergo the procedure.                           After obtaining informed consent, the endoscope was                            passed under direct vision. Throughout the                            procedure, the  patient's blood pressure, pulse, and                            oxygen saturations were monitored continuously. The                            [id number]-H190 ([id number]) Olympus endoscope was introduced                            through the mouth, and advanced to the second part                            of duodenum. The RADIAL EUS ( [id number] ) [id number] was                            introduced through the mouth, and advanced to the                            duodenum for ultrasound examination from the                            esophagus, stomach and duodenum. The [id number]                            ([id number]) Olympus endosonoscope was introduced                            through the mouth, and advanced to the stomach for                            ultrasound examination from the esophagus and                            stomach. The upper EUS was accomplished without                            difficulty. The patient tolerated the procedure. Scope In: Scope Out: Findings:      ENDOSCOPIC FINDING: :      No gross  lesions were noted in the entire esophagus.      The Z-line was irregular and was found 35 cm from the incisors.      A 3 cm hiatal hernia was present.      A medium-sized, submucosal, non-circumferential mass with no bleeding       was found in the very proximal gastric fundus.      Patchy mildly erythematous mucosa without bleeding was found in the       entire examined stomach.      No gross lesions were noted in the duodenal bulb, in the first portion       of the duodenum and in the second portion of the duodenum.      ENDOSONOGRAPHIC FINDING: :      A round intramural (subepithelial) lesion was found in the fundus of the       stomach. The lesion was hypoechoic. Sonographically, the lesion appeared       to originate from the muscularis propria (Layer 4). The lesion measured       26 mm (in maximum thickness). The lesion also measured 21 mm in       diameter. The outer  endosonographic borders were well defined. Stability       of the region was somewhat difficult as result of this being in the very       proximal aspect of the stomach and with the sliding hiatal hernia. Fine       needle biopsy was performed. Color Doppler imaging was utilized prior to       needle puncture to confirm a lack of significant vascular structures       within the needle path. Seven passes were made with the 22 gauge Acquire       biopsy needle using a transgastric approach. A visible core of tissue       was obtained. Preliminary cytologic examination and touch preps were       performed. Final cytology results are pending.      Endosonographic imaging in the rest of the visualized stomach showed no       wall thickening.      Endosonographic imaging in the duodenal bulb and in the apex of the       duodenal bulb showed no intramural (subepithelial) lesion.      Endosonographic imaging in the visualized portion of the liver showed no       mass.      No malignant-appearing lymph nodes were visualized in the celiac region       (level 20) and perigastric region.      The celiac region was visualized.      The esophagus, stomach and duodenum were examined endosonographically. Impression:               EGD impression:                           - No gross lesions in the entire esophagus. Z-line                            irregular, 35 cm from the incisors.                           - 3 cm hiatal hernia.                           -  Gastric subepithelial lesion in the gastric                            fundus.                           - Erythematous mucosa in the stomach.                           - No gross lesions in the duodenal bulb, in the                            first portion of the duodenum and in the second                            portion of the duodenum.                           EUS impression:                           - An intramural (subepithelial) lesion  was found in                            the fundus of the stomach. The lesion appeared to                            originate from within the muscularis propria (Layer                            4). Cytology results are pending. 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 Fine needle                            biopsy performed.                           - No malignant-appearing lymph nodes were                            visualized in the celiac region (level 20) and                            perigastric region. Moderate Sedation:      Not Applicable - Patient had care per Anesthesia. Recommendation:           - The patient will be observed post-procedure,                            until all discharge criteria are met.                           -  Discharge patient to home.                           - Patient has a contact number available for                            emergencies. The signs and symptoms of potential                            delayed complications were discussed with the                            patient. Return to normal activities tomorrow.                            Written discharge instructions were provided to the                            patient.                           - Resume previous diet.                           - Observe patient's clinical course.                           - Await cytology results and await path results.                           - The findings and recommendations were discussed                            with the patient.                           - The findings and recommendations were discussed                            with the patient's family. Procedure Code(s):        --- Professional ---                           [phone], Esophagogastroduodenoscopy, flexible,                            transoral; with transendoscopic  ultrasound-guided                            intramural or transmural fine needle                            aspiration/biopsy(s) (includes endoscopic                            ultrasound examination of the esophagus, stomach,  and either the duodenum or a surgically altered                            stomach where the jejunum is examined distal to the                            anastomosis) Diagnosis Code(s):        --- Professional ---                           K22.89, Other specified disease of esophagus                           K44.9, Diaphragmatic hernia without obstruction or                            gangrene                           D49.0, Neoplasm of unspecified behavior of                            digestive system                           K31.89, Other diseases of stomach and duodenum                           I89.9, Noninfective disorder of lymphatic vessels                            and lymph nodes, unspecified CPT copyright 2022 American Medical Association. All rights reserved. The codes documented in this report are preliminary and upon coder review may  be revised to meet current compliance requirements. Aloha Finner, MD 08/25/2024 3:28:18 PM Number of Addenda: 0

## 2024-08-25 NOTE — Transfer of Care (Signed)
 Immediate Anesthesia Transfer of Care Note  Patient: KAITLIN ALCINDOR  Procedure(s) Performed: ULTRASOUND, UPPER GI TRACT, ENDOSCOPIC EGD (ESOPHAGOGASTRODUODENOSCOPY) FINE NEEDLE ASPIRATION BIOPSY  Patient Location: PACU  Anesthesia Type:MAC  Level of Consciousness: drowsy  Airway & Oxygen Therapy: Patient Spontanous Breathing and Patient connected to face mask oxygen  Post-op Assessment: Report given to RN and Post -op Vital signs reviewed and stable  Post vital signs: Reviewed and stable  Last Vitals:  Vitals Value Taken Time  BP 148/70 08/25/24 16:11  Temp 97.1   Pulse 65 08/25/24 16:14  Resp 13 08/25/24 16:14  SpO2 99 % 08/25/24 16:14  Vitals shown include unfiled device data.  Last Pain:  Vitals:   08/25/24 1248  TempSrc: Temporal  PainSc: 0-No pain         Complications: No notable events documented.

## 2024-08-25 NOTE — Anesthesia Preprocedure Evaluation (Signed)
 Anesthesia Evaluation  Patient identified by MRN, date of birth, ID band Patient awake    Reviewed: Allergy & Precautions, NPO status , Patient's Chart, lab work & pertinent test results  Airway Mallampati: II  TM Distance: >3 FB Neck ROM: Full    Dental  (+) Teeth Intact, Dental Advisory Given   Pulmonary former smoker   Pulmonary exam normal breath sounds clear to auscultation       Cardiovascular hypertension, Pt. on medications Normal cardiovascular exam+ dysrhythmias Atrial Fibrillation  Rhythm:Regular Rate:Normal     Neuro/Psych negative neurological ROS  negative psych ROS   GI/Hepatic Neg liver ROS, PUD,GERD  Medicated,,  Endo/Other  negative endocrine ROS    Renal/GU negative Renal ROS     Musculoskeletal  (+) Arthritis ,    Abdominal   Peds  Hematology negative hematology ROS (+)   Anesthesia Other Findings Day of surgery medications reviewed with the patient.  Reproductive/Obstetrics                              Anesthesia Physical Anesthesia Plan  ASA: 3  Anesthesia Plan: MAC   Post-op Pain Management: Minimal or no pain anticipated   Induction: Intravenous  PONV Risk Score and Plan: 2 and TIVA and Treatment may vary due to age or medical condition  Airway Management Planned: Natural Airway and Simple Face Mask  Additional Equipment:   Intra-op Plan:   Post-operative Plan:   Informed Consent: I have reviewed the patients History and Physical, chart, labs and discussed the procedure including the risks, benefits and alternatives for the proposed anesthesia with the patient or authorized representative who has indicated his/her understanding and acceptance.     Dental advisory given  Plan Discussed with: CRNA  Anesthesia Plan Comments:         Anesthesia Quick Evaluation

## 2024-08-28 LAB — CYTOLOGY - NON PAP

## 2024-08-29 ENCOUNTER — Other Ambulatory Visit: Payer: Self-pay

## 2024-08-29 ENCOUNTER — Ambulatory Visit: Payer: Self-pay | Admitting: Gastroenterology

## 2024-08-29 DIAGNOSIS — C49A0 Gastrointestinal stromal tumor, unspecified site: Secondary | ICD-10-CM

## 2024-08-29 NOTE — Telephone Encounter (Signed)
 Patient called requesting a call back regarding previous note. Please advise, thank you

## 2024-09-09 ENCOUNTER — Ambulatory Visit (HOSPITAL_COMMUNITY)
Admission: RE | Admit: 2024-09-09 | Discharge: 2024-09-09 | Disposition: A | Discharge status: Home / Self Care | Source: Ambulatory Visit | Attending: Gastroenterology | Admitting: Gastroenterology

## 2024-09-09 DIAGNOSIS — C49A0 Gastrointestinal stromal tumor, unspecified site: Secondary | ICD-10-CM | POA: Diagnosis present

## 2024-09-09 MED ORDER — IOHEXOL 300 MG/ML  SOLN
100.0000 mL | Freq: Once | INTRAMUSCULAR | Status: AC | PRN
  Administered 2024-09-09: 100 mL via INTRAVENOUS

## 2024-09-10 LAB — HM MAMMOGRAPHY

## 2024-09-16 ENCOUNTER — Telehealth: Payer: Self-pay | Admitting: Gastroenterology

## 2024-09-16 NOTE — Telephone Encounter (Signed)
 Inbound call from patient requesting to speak with nurse Patty regarding her CT scan last Tuesday. Please advise.

## 2024-09-16 NOTE — Telephone Encounter (Signed)
 The pt has been advised that results have not been received. We will call as soon as able.

## 2024-09-22 ENCOUNTER — Telehealth: Payer: Self-pay

## 2024-09-22 ENCOUNTER — Ambulatory Visit: Payer: Self-pay | Admitting: Gastroenterology

## 2024-09-22 NOTE — Telephone Encounter (Signed)
-----   Message from [place] P sent at 09/22/2024 10:47 AM EST ----- Pt is sch for 09/25/24 at 11:20 with Dr Dasie.  We had tried to call her on her cell and she informed me that she doesn't use that.   [place]lam ----- Message ----- From: Anitra Odetta CROME, RN Sent: 09/22/2024   9:45 AM EST To: [facility] Referrals  We sent a referral back on December 19  (see below) the pt has not heard from your office can you please check on this for me?  [facility]  08/29/2024  1:17 PM Kerron Sedano CROME Anitra, RN  [facility] AMB CHART REVIEW DEMOGRAPHICS    Cover Page Message : referral to Dr. Dasie or Dr. Aron for consideration of surgical resection newly diagnosed GIST    [facility]  08/29/2024  1:16 PM Odetta CROME Anitra, RN  SURGICAL PATHOLOGY ([phone]) CYTOLOGY - NON PAP ([id number])    Cover Page Message : referral to Dr. Dasie or Dr. 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

## 2024-09-25 ENCOUNTER — Ambulatory Visit: Payer: Self-pay | Admitting: Surgery

## 2024-09-25 ENCOUNTER — Encounter: Payer: Self-pay | Admitting: Family Medicine

## 2024-09-25 ENCOUNTER — Other Ambulatory Visit: Payer: Self-pay | Admitting: Family Medicine

## 2024-09-29 ENCOUNTER — Telehealth: Payer: Self-pay | Admitting: Family Medicine

## 2024-09-29 NOTE — Telephone Encounter (Signed)
 I spoke with patient by phone She has a gastric tumor that [facility] surgery is planning on doing surgery on somewhere in the upcoming 30 days Hopefully this will be curative CAT scan did not show any mets  Nurses Patient requested a follow-up visit with us -she will not need to do a follow-up visit regarding the surgery she understands that. Please set her up for a regular follow-up visit with myself later in the spring-May would be fine Please let her know thank you

## 2024-10-06 ENCOUNTER — Ambulatory Visit (INDEPENDENT_AMBULATORY_CARE_PROVIDER_SITE_OTHER): Admitting: Gastroenterology

## 2024-10-27 ENCOUNTER — Encounter (HOSPITAL_COMMUNITY)

## 2024-10-30 ENCOUNTER — Ambulatory Visit (INDEPENDENT_AMBULATORY_CARE_PROVIDER_SITE_OTHER): Admitting: Gastroenterology

## 2024-11-03 ENCOUNTER — Inpatient Hospital Stay (HOSPITAL_COMMUNITY): Admit: 2024-11-03 | Admitting: Surgery

## 2024-11-03 SURGERY — GASTRECTOMY, PARTIAL, ROBOT-ASSISTED
Anesthesia: General

## 2025-05-29 ENCOUNTER — Ambulatory Visit
